# Patient Record
Sex: Male | Born: 1950 | Race: White | Hispanic: No | Smoking: Former smoker
Health system: Southern US, Community
[De-identification: ages and names within clinical notes are randomized; demographics above are authoritative.]

## PROBLEM LIST (undated history)

## (undated) DIAGNOSIS — M549 Dorsalgia, unspecified: Secondary | ICD-10-CM

## (undated) DIAGNOSIS — I10 Essential (primary) hypertension: Secondary | ICD-10-CM

## (undated) DIAGNOSIS — N400 Enlarged prostate without lower urinary tract symptoms: Secondary | ICD-10-CM

## (undated) DIAGNOSIS — J449 Chronic obstructive pulmonary disease, unspecified: Secondary | ICD-10-CM

## (undated) HISTORY — PX: PYLOROMYOTOMY: SUR1063

## (undated) HISTORY — DX: Dorsalgia, unspecified: M54.9

## (undated) HISTORY — DX: Benign prostatic hyperplasia without lower urinary tract symptoms: N40.0

## (undated) HISTORY — DX: Chronic obstructive pulmonary disease, unspecified: J44.9

## (undated) HISTORY — DX: Essential (primary) hypertension: I10

## (undated) HISTORY — PX: TONSILLECTOMY: SUR1361

---

## 2005-04-05 DIAGNOSIS — M549 Dorsalgia, unspecified: Secondary | ICD-10-CM

## 2005-04-05 HISTORY — DX: Dorsalgia, unspecified: M54.9

## 2014-10-07 ENCOUNTER — Telehealth: Payer: Self-pay | Admitting: Family Medicine

## 2014-10-07 ENCOUNTER — Ambulatory Visit (INDEPENDENT_AMBULATORY_CARE_PROVIDER_SITE_OTHER): Payer: PRIVATE HEALTH INSURANCE | Admitting: Physician Assistant

## 2014-10-07 VITALS — BP 158/82 | HR 110 | Temp 98.3°F | Resp 18 | Ht 67.0 in | Wt 151.0 lb

## 2014-10-07 DIAGNOSIS — I10 Essential (primary) hypertension: Secondary | ICD-10-CM | POA: Diagnosis not present

## 2014-10-07 DIAGNOSIS — F17200 Nicotine dependence, unspecified, uncomplicated: Secondary | ICD-10-CM | POA: Insufficient documentation

## 2014-10-07 DIAGNOSIS — N400 Enlarged prostate without lower urinary tract symptoms: Secondary | ICD-10-CM | POA: Insufficient documentation

## 2014-10-07 DIAGNOSIS — M542 Cervicalgia: Secondary | ICD-10-CM | POA: Diagnosis not present

## 2014-10-07 DIAGNOSIS — J449 Chronic obstructive pulmonary disease, unspecified: Secondary | ICD-10-CM | POA: Insufficient documentation

## 2014-10-07 MED ORDER — TRAMADOL HCL 50 MG PO TABS: 50.0000 mg | ORAL_TABLET | Freq: Three times a day (TID) | ORAL | Status: DC | PRN

## 2014-10-07 MED ORDER — CYCLOBENZAPRINE HCL 10 MG PO TABS: 10.0000 mg | ORAL_TABLET | Freq: Three times a day (TID) | ORAL | Status: DC | PRN

## 2014-10-07 NOTE — Progress Notes (Signed)
Subjective:   Patient ID: Travis Romero, male     DOB: 05-20-1950, 64 y.o.    MRN: [id number]  PCP: Orpah Melter, MD  Chief Complaint  Patient presents with  . Neck Pain    X 4.5 weeks    HPI  Presents for evaluation of 4.5 weeks of LEFT sided neck pain. He is accompanied today by his cousin [name], who drive him here.  "I can hardly function. It's very severe." History of similar symptoms in the past which resolved with Flexeril and Tramadol. Since this episode began, he has not tried anything to help-no ibuprofen, naproxen, acetaminophen, heat, ice or massage. "It won't do a damn thing."  Believes that he slept on it wrong. LEFT thumb and index, middle and ring fingers feel numb. Hasn't dropped anything. No radicular pain.  Prior to Admission medications   Medication Sig Start Date End Date Taking? Authorizing Provider  amLODipine (NORVASC) 2.5 MG tablet Take 2.5 mg by mouth daily.   Yes Historical Provider, MD  cloNIDine (CATAPRES) 0.2 MG tablet Take 0.2 mg by mouth 2 (two) times daily.   Yes Historical Provider, MD  Ipratropium-Albuterol (COMBIVENT IN) Inhale into the lungs.   Yes Historical Provider, MD  mirtazapine (REMERON) 45 MG tablet Take 45 mg by mouth at bedtime.   Yes Historical Provider, MD  spironolactone (ALDACTONE) 100 MG tablet Take 150 mg by mouth daily.   Yes Historical Provider, MD     Allergies  Allergen Reactions  . Advair Diskus [Fluticasone-Salmeterol] Other (See Comments)    Exacerbated BPH      Patient Active Problem List   Diagnosis Date Noted  . Benign essential HTN 10/07/2014  . BPH (benign prostatic hyperplasia) 10/07/2014  . COPD (chronic obstructive pulmonary disease) 10/07/2014  . Smoker 10/07/2014     Family History  Problem Relation Age of Onset  . Hypertension Mother   . Heart disease Father      History   Social History  . Marital Status: Divorced    Spouse Name: N/A  . Number of Children: 0  . Years of  Education: Master's   Occupational History  . 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 Smoking status: Current Every Day Smoker    Types: Cigarettes  . Smokeless tobacco: Not on file  . Alcohol Use: 0.0 oz/week    0 Standard drinks or equivalent per week  . Drug Use: No  . Sexual Activity: Not on file   Other Topics Concern  . Not on file   Social History Narrative  . Lives alone.        Review of Systems As above.      Objective:  Physical Exam  Constitutional: He is oriented to person, place, and time. He appears well-developed and well-nourished. He is active and cooperative. No distress.  BP 158/82 mmHg  Pulse 110  Temp(Src) 98.3 F (36.8 C) (Oral)  Resp 18  Ht 5\' 7"  (1.702 m)  Wt 151 lb (68.493 kg)  BMI 23.64 kg/m2  SpO2 95%   Eyes: Conjunctivae are normal.  Pulmonary/Chest: Effort normal.  Musculoskeletal:       Cervical back: He exhibits decreased range of motion (reduced ROM in all directions, especially extension, rotation tothe LEFT and side-bending to the LEFT, due to pain), pain and spasm (LEFT paraspinous muscles). He exhibits no tenderness, no bony tenderness, no swelling and no edema.       Right hand: Normal. Normal sensation  noted. Normal strength noted.       Left hand: Normal. Normal sensation noted. Normal strength noted.  Neurological: He is alert and oriented to person, place, and time.  Skin: No rash noted. Nails show clubbing (yellowed).  Psychiatric: He has a normal mood and affect. His speech is normal and behavior is normal.             Assessment & Plan:  1. Benign essential HTN Likely elevated today due to the pain he is experiencing. He has a cuff at home and will monitor it. If remains >140/90 as his pain resolves, he will follow-up with PCP.  2. Neck pain on left side Wry neck. Encouraged gentle ROM and application of moist heat. RTC if symptoms worsen/persist. Plan c-spine films. -  cyclobenzaprine (FLEXERIL) 10 MG tablet; Take 1 tablet (10 mg total) by mouth 3 (three) times daily as needed for muscle spasms.  Dispense: 30 tablet; Refill: 0 - traMADol (ULTRAM) 50 MG tablet; Take 1 tablet (50 mg total) by mouth every 8 (eight) hours as needed.  Dispense: 30 tablet; Refill: 0   Fara Chute, PA-C Physician Assistant-Certified [facility]

## 2014-10-07 NOTE — Patient Instructions (Addendum)
When you need to work/drive, reserve the cyclobenzaprine (Flexeril) for bedtime, due to the sedating effects of the muscle relaxer.  Check your blood pressure as your pain improves. If it remains elevated (above 140/90), please follow-up with your primary care provider.

## 2014-10-07 NOTE — Telephone Encounter (Signed)
Flexeril 10 mg TID #30, No RF , called in to [facility] [phone]. Patient had tramadol written rx, flexeril did not electronically go through.

## 2014-10-09 ENCOUNTER — Telehealth: Payer: Self-pay | Admitting: Radiology

## 2014-10-09 ENCOUNTER — Telehealth: Payer: Self-pay

## 2014-10-09 NOTE — Telephone Encounter (Signed)
Pt states since taking prescribed tramadol/flexeril he is having quite noticeable swelling of genitals Please advise   Best phone pt is [phone]

## 2014-10-09 NOTE — Telephone Encounter (Signed)
Pt called stating he has had swelling of his genitals due to his Rx for Flexeril and Tramadol prescribed on 10/07/14. Per Araceli Bouche PA-C: pt advised to discontinue his Flexeril and continue his Tramadol as prescribed. He was also informed to use ice and warm compress for comfort and swelling.

## 2014-10-10 NOTE — Telephone Encounter (Signed)
Travis Romero and Travis Romero spoke to pt yesterday and gave advice.

## 2014-10-11 ENCOUNTER — Emergency Department (HOSPITAL_COMMUNITY): Payer: PRIVATE HEALTH INSURANCE

## 2014-10-11 ENCOUNTER — Inpatient Hospital Stay (HOSPITAL_COMMUNITY)
Admission: EM | Admit: 2014-10-11 | Discharge: 2014-11-04 | DRG: 233 | Disposition: A | Discharge status: Rehabilitation Facility | Payer: PRIVATE HEALTH INSURANCE | Attending: Thoracic Surgery (Cardiothoracic Vascular Surgery) | Admitting: Thoracic Surgery (Cardiothoracic Vascular Surgery)

## 2014-10-11 DIAGNOSIS — I255 Ischemic cardiomyopathy: Secondary | ICD-10-CM | POA: Diagnosis present

## 2014-10-11 DIAGNOSIS — R0602 Shortness of breath: Secondary | ICD-10-CM | POA: Insufficient documentation

## 2014-10-11 DIAGNOSIS — F4322 Adjustment disorder with anxiety: Secondary | ICD-10-CM | POA: Diagnosis present

## 2014-10-11 DIAGNOSIS — D62 Acute posthemorrhagic anemia: Secondary | ICD-10-CM | POA: Diagnosis not present

## 2014-10-11 DIAGNOSIS — I509 Heart failure, unspecified: Secondary | ICD-10-CM

## 2014-10-11 DIAGNOSIS — G8929 Other chronic pain: Secondary | ICD-10-CM | POA: Diagnosis present

## 2014-10-11 DIAGNOSIS — Z978 Presence of other specified devices: Secondary | ICD-10-CM | POA: Insufficient documentation

## 2014-10-11 DIAGNOSIS — J9602 Acute respiratory failure with hypercapnia: Secondary | ICD-10-CM | POA: Diagnosis present

## 2014-10-11 DIAGNOSIS — J918 Pleural effusion in other conditions classified elsewhere: Secondary | ICD-10-CM | POA: Diagnosis present

## 2014-10-11 DIAGNOSIS — Z79899 Other long term (current) drug therapy: Secondary | ICD-10-CM

## 2014-10-11 DIAGNOSIS — J449 Chronic obstructive pulmonary disease, unspecified: Secondary | ICD-10-CM | POA: Diagnosis present

## 2014-10-11 DIAGNOSIS — J810 Acute pulmonary edema: Secondary | ICD-10-CM | POA: Insufficient documentation

## 2014-10-11 DIAGNOSIS — F419 Anxiety disorder, unspecified: Secondary | ICD-10-CM | POA: Diagnosis present

## 2014-10-11 DIAGNOSIS — Z9889 Other specified postprocedural states: Secondary | ICD-10-CM

## 2014-10-11 DIAGNOSIS — R601 Generalized edema: Secondary | ICD-10-CM

## 2014-10-11 DIAGNOSIS — D7589 Other specified diseases of blood and blood-forming organs: Secondary | ICD-10-CM | POA: Diagnosis present

## 2014-10-11 DIAGNOSIS — I110 Hypertensive heart disease with heart failure: Secondary | ICD-10-CM | POA: Diagnosis present

## 2014-10-11 DIAGNOSIS — Z888 Allergy status to other drugs, medicaments and biological substances status: Secondary | ICD-10-CM

## 2014-10-11 DIAGNOSIS — W06XXXA Fall from bed, initial encounter: Secondary | ICD-10-CM | POA: Diagnosis not present

## 2014-10-11 DIAGNOSIS — I214 Non-ST elevation (NSTEMI) myocardial infarction: Secondary | ICD-10-CM | POA: Diagnosis present

## 2014-10-11 DIAGNOSIS — J9601 Acute respiratory failure with hypoxia: Secondary | ICD-10-CM

## 2014-10-11 DIAGNOSIS — Z8249 Family history of ischemic heart disease and other diseases of the circulatory system: Secondary | ICD-10-CM

## 2014-10-11 DIAGNOSIS — I2510 Atherosclerotic heart disease of native coronary artery without angina pectoris: Secondary | ICD-10-CM | POA: Diagnosis present

## 2014-10-11 DIAGNOSIS — R0902 Hypoxemia: Secondary | ICD-10-CM

## 2014-10-11 DIAGNOSIS — R338 Other retention of urine: Secondary | ICD-10-CM | POA: Diagnosis not present

## 2014-10-11 DIAGNOSIS — T17990A Other foreign object in respiratory tract, part unspecified in causing asphyxiation, initial encounter: Secondary | ICD-10-CM | POA: Diagnosis not present

## 2014-10-11 DIAGNOSIS — E871 Hypo-osmolality and hyponatremia: Secondary | ICD-10-CM | POA: Diagnosis present

## 2014-10-11 DIAGNOSIS — G4700 Insomnia, unspecified: Secondary | ICD-10-CM | POA: Diagnosis present

## 2014-10-11 DIAGNOSIS — E873 Alkalosis: Secondary | ICD-10-CM | POA: Diagnosis present

## 2014-10-11 DIAGNOSIS — F17210 Nicotine dependence, cigarettes, uncomplicated: Secondary | ICD-10-CM | POA: Diagnosis present

## 2014-10-11 DIAGNOSIS — F329 Major depressive disorder, single episode, unspecified: Secondary | ICD-10-CM | POA: Diagnosis present

## 2014-10-11 DIAGNOSIS — E785 Hyperlipidemia, unspecified: Secondary | ICD-10-CM | POA: Diagnosis present

## 2014-10-11 DIAGNOSIS — Z4659 Encounter for fitting and adjustment of other gastrointestinal appliance and device: Secondary | ICD-10-CM

## 2014-10-11 DIAGNOSIS — I5043 Acute on chronic combined systolic (congestive) and diastolic (congestive) heart failure: Secondary | ICD-10-CM | POA: Diagnosis not present

## 2014-10-11 DIAGNOSIS — Z951 Presence of aortocoronary bypass graft: Secondary | ICD-10-CM

## 2014-10-11 DIAGNOSIS — I5041 Acute combined systolic (congestive) and diastolic (congestive) heart failure: Secondary | ICD-10-CM

## 2014-10-11 DIAGNOSIS — I739 Peripheral vascular disease, unspecified: Secondary | ICD-10-CM | POA: Diagnosis present

## 2014-10-11 DIAGNOSIS — R339 Retention of urine, unspecified: Secondary | ICD-10-CM | POA: Insufficient documentation

## 2014-10-11 DIAGNOSIS — K5900 Constipation, unspecified: Secondary | ICD-10-CM | POA: Diagnosis not present

## 2014-10-11 DIAGNOSIS — T502X5A Adverse effect of carbonic-anhydrase inhibitors, benzothiadiazides and other diuretics, initial encounter: Secondary | ICD-10-CM | POA: Diagnosis not present

## 2014-10-11 DIAGNOSIS — Z720 Tobacco use: Secondary | ICD-10-CM | POA: Insufficient documentation

## 2014-10-11 DIAGNOSIS — Z9289 Personal history of other medical treatment: Secondary | ICD-10-CM

## 2014-10-11 DIAGNOSIS — N179 Acute kidney failure, unspecified: Secondary | ICD-10-CM | POA: Diagnosis present

## 2014-10-11 DIAGNOSIS — J189 Pneumonia, unspecified organism: Secondary | ICD-10-CM | POA: Diagnosis not present

## 2014-10-11 DIAGNOSIS — R54 Age-related physical debility: Secondary | ICD-10-CM | POA: Diagnosis present

## 2014-10-11 DIAGNOSIS — J90 Pleural effusion, not elsewhere classified: Secondary | ICD-10-CM | POA: Insufficient documentation

## 2014-10-11 DIAGNOSIS — N401 Enlarged prostate with lower urinary tract symptoms: Secondary | ICD-10-CM | POA: Diagnosis present

## 2014-10-11 LAB — BLOOD GAS, ARTERIAL
ACID-BASE DEFICIT: 2.4 mmol/L — AB (ref 0.0–2.0)
Bicarbonate: 25.6 mEq/L — ABNORMAL HIGH (ref 20.0–24.0)
DRAWN BY: 422461
FIO2: 1 %
O2 CONTENT: 15 L/min
O2 SAT: 90.6 %
PCO2 ART: 58.5 mmHg — AB (ref 35.0–45.0)
Patient temperature: 98.3
TCO2: 23 mmol/L (ref 0–100)
pH, Arterial: 7.262 — ABNORMAL LOW (ref 7.350–7.450)
pO2, Arterial: 74.9 mmHg — ABNORMAL LOW (ref 80.0–100.0)

## 2014-10-11 LAB — CBC WITH DIFFERENTIAL/PLATELET
Basophils Absolute: 0.1 10*3/uL (ref 0.0–0.1)
Basophils Relative: 1 % (ref 0–1)
EOS PCT: 1 % (ref 0–5)
Eosinophils Absolute: 0.1 10*3/uL (ref 0.0–0.7)
HEMATOCRIT: 47.4 % (ref 39.0–52.0)
Hemoglobin: 15.7 g/dL (ref 13.0–17.0)
Lymphocytes Relative: 13 % (ref 12–46)
Lymphs Abs: 1.3 10*3/uL (ref 0.7–4.0)
MCH: 36.3 pg — AB (ref 26.0–34.0)
MCHC: 33.1 g/dL (ref 30.0–36.0)
MCV: 109.7 fL — AB (ref 78.0–100.0)
MONO ABS: 1.3 10*3/uL — AB (ref 0.1–1.0)
Monocytes Relative: 12 % (ref 3–12)
Neutro Abs: 7.5 10*3/uL (ref 1.7–7.7)
Neutrophils Relative %: 73 % (ref 43–77)
PLATELETS: 391 10*3/uL (ref 150–400)
RBC: 4.32 MIL/uL (ref 4.22–5.81)
RDW: 14.4 % (ref 11.5–15.5)
WBC: 10.2 10*3/uL (ref 4.0–10.5)

## 2014-10-11 LAB — TROPONIN I: Troponin I: 0.44 ng/mL — ABNORMAL HIGH (ref <0.031)

## 2014-10-11 LAB — URINE MICROSCOPIC-ADD ON

## 2014-10-11 LAB — HEPATIC FUNCTION PANEL
ALK PHOS: 90 U/L (ref 38–126)
ALT: 26 U/L (ref 17–63)
AST: 29 U/L (ref 15–41)
Albumin: 3.2 g/dL — ABNORMAL LOW (ref 3.5–5.0)
Bilirubin, Direct: 0.1 mg/dL (ref 0.1–0.5)
Indirect Bilirubin: 0.5 mg/dL (ref 0.3–0.9)
TOTAL PROTEIN: 7.1 g/dL (ref 6.5–8.1)
Total Bilirubin: 0.6 mg/dL (ref 0.3–1.2)

## 2014-10-11 LAB — BASIC METABOLIC PANEL
Anion gap: 10 (ref 5–15)
BUN: 23 mg/dL — AB (ref 6–20)
CALCIUM: 9.2 mg/dL (ref 8.9–10.3)
CHLORIDE: 96 mmol/L — AB (ref 101–111)
CO2: 25 mmol/L (ref 22–32)
Creatinine, Ser: 1.16 mg/dL (ref 0.61–1.24)
GFR calc Af Amer: >60 mL/min (ref >60)
GFR calc non Af Amer: >60 mL/min (ref >60)
Glucose, Bld: 137 mg/dL — ABNORMAL HIGH (ref 65–99)
Potassium: 5.1 mmol/L (ref 3.5–5.1)
Sodium: 131 mmol/L — ABNORMAL LOW (ref 135–145)

## 2014-10-11 LAB — AMMONIA: Ammonia: 23 umol/L (ref 9–35)

## 2014-10-11 LAB — URINALYSIS, ROUTINE W REFLEX MICROSCOPIC
BILIRUBIN URINE: NEGATIVE
Glucose, UA: NEGATIVE mg/dL
Hgb urine dipstick: NEGATIVE
Ketones, ur: NEGATIVE mg/dL
LEUKOCYTES UA: NEGATIVE
Nitrite: NEGATIVE
PH: 6 (ref 5.0–8.0)
Protein, ur: >300 mg/dL — AB
Specific Gravity, Urine: 1.016 (ref 1.005–1.030)
Urobilinogen, UA: 0.2 mg/dL (ref 0.0–1.0)

## 2014-10-11 LAB — BRAIN NATRIURETIC PEPTIDE: B Natriuretic Peptide: 1073.2 pg/mL — ABNORMAL HIGH (ref 0.0–100.0)

## 2014-10-11 LAB — PROTIME-INR
INR: 0.91 (ref 0.00–1.49)
Prothrombin Time: 12.5 seconds (ref 11.6–15.2)

## 2014-10-11 MED ORDER — PROPOFOL 1000 MG/100ML IV EMUL
5.0000 ug/kg/min | Freq: Once | INTRAVENOUS | Status: AC
  Administered 2014-10-11: 24.331 ug/kg/min via INTRAVENOUS
  Filled 2014-10-11: qty 100

## 2014-10-11 MED ORDER — SUCCINYLCHOLINE CHLORIDE 20 MG/ML IJ SOLN
INTRAMUSCULAR | Status: AC
  Administered 2014-10-11: 100 mg
  Filled 2014-10-11: qty 1

## 2014-10-11 MED ORDER — FUROSEMIDE 10 MG/ML IJ SOLN
80.0000 mg | Freq: Once | INTRAMUSCULAR | Status: AC
  Administered 2014-10-11: 80 mg via INTRAVENOUS
  Filled 2014-10-11: qty 8

## 2014-10-11 MED ORDER — LORAZEPAM 2 MG/ML IJ SOLN
1.0000 mg | Freq: Once | INTRAMUSCULAR | Status: AC
Start: 2014-10-11 — End: 2014-10-11
  Administered 2014-10-11: 1 mg via INTRAVENOUS
  Filled 2014-10-11: qty 1

## 2014-10-11 MED ORDER — LIDOCAINE HCL (CARDIAC) 20 MG/ML IV SOLN
INTRAVENOUS | Status: AC
  Filled 2014-10-11: qty 5

## 2014-10-11 MED ORDER — ROCURONIUM BROMIDE 50 MG/5ML IV SOLN
INTRAVENOUS | Status: AC
  Filled 2014-10-11: qty 2

## 2014-10-11 MED ORDER — IPRATROPIUM-ALBUTEROL 0.5-2.5 (3) MG/3ML IN SOLN
3.0000 mL | Freq: Once | RESPIRATORY_TRACT | Status: AC
  Administered 2014-10-11: 3 mL via RESPIRATORY_TRACT
  Filled 2014-10-11: qty 3

## 2014-10-11 MED ORDER — NITROGLYCERIN IN D5W 200-5 MCG/ML-% IV SOLN
0.0000 ug/min | Freq: Once | INTRAVENOUS | Status: AC
  Administered 2014-10-11: 5 ug/min via INTRAVENOUS
  Filled 2014-10-11: qty 250

## 2014-10-11 MED ORDER — ETOMIDATE 2 MG/ML IV SOLN
INTRAVENOUS | Status: AC
  Administered 2014-10-11: 20 mg
  Filled 2014-10-11: qty 20

## 2014-10-11 MED ORDER — MIDAZOLAM HCL 2 MG/2ML IJ SOLN
2.0000 mg | Freq: Once | INTRAMUSCULAR | Status: AC
  Administered 2014-10-11: 2 mg via INTRAVENOUS
  Filled 2014-10-11: qty 2

## 2014-10-11 NOTE — ED Notes (Signed)
EKG given to EDP Pollina for review

## 2014-10-11 NOTE — ED Notes (Addendum)
Etomidate 20 mg IVgiven (instead of at 2045 as recorded).

## 2014-10-11 NOTE — ED Notes (Signed)
Pt BIB EMS. Pt started Flexeril last Monday. Pt had swelling to scrotum and shortness of breath a few days later and stopped the Flexeril. Pt states today he is having worsening scrotal swelling and worsening shortness of breath. Pt has hx of COPD and has been using his combivent inhaler today. Pt has dyspnea on exertion and appears anxious. Pt also incontinent of urine today.

## 2014-10-11 NOTE — ED Notes (Signed)
Succinylcholine 100 mg given at this time (instead of at 2046 as recorded).

## 2014-10-11 NOTE — Progress Notes (Signed)
Pt placed on BiPAP via ffm using noninvasive mode on ventilator.  Pt on PSV with PS of 9 and PEEP of 6.  Pt stable and comfortable at this time.  RT to assess as needed.

## 2014-10-11 NOTE — ED Notes (Signed)
CCM called for ETA, they are tied up in procedures with other critical patients, unkn ETA.

## 2014-10-11 NOTE — ED Provider Notes (Signed)
CSN: [id number]     Arrival date & time 10/11/14  1910 History   First MD Initiated Contact with Patient 10/11/14 1915     Chief Complaint  Patient presents with  . Groin Swelling     (Consider location/radiation/quality/duration/timing/severity/associated sxs/prior Treatment) HPI Comments: Patient presents to the ER for evaluation of shortness of breath. Patient thinks he might be having a reaction to a medication. He reports that last week he woke up with neck pain and was seen in urgent care. He was started on Flexeril which helped his neck pain, but after he took it for several days, he started to notice that he was experiencing increased shortness of breath and also swelling of his scrotum. He stopped the medication, but symptoms have progressively worsened. Patient denies any chest pain. He has not had any tongue or throat swelling. There is no rash. Patient does report a history of COPD, has been using his Combivent without improvement.   Past Medical History  Diagnosis Date  . Hypertension   . COPD (chronic obstructive pulmonary disease)   . BPH (benign prostatic hyperplasia)   . Back pain 2007    after fall down steps   Past Surgical History  Procedure Laterality Date  . Tonsillectomy    . Pyloromyotomy      pyloric stenosis   Family History  Problem Relation Age of Onset  . Hypertension Mother   . Heart disease Father    History  Substance Use Topics  . Smoking status: Current Every Day Smoker -- 20 years    Types: Cigarettes  . Smokeless tobacco: Never Used  . Alcohol Use: 1.2 - 1.8 oz/week    2-3 Standard drinks or equivalent per week    Review of Systems  Constitutional: Negative for fever.  Respiratory: Positive for shortness of breath.   Cardiovascular: Negative for chest pain.  Gastrointestinal: Negative for abdominal pain.  Genitourinary: Positive for scrotal swelling.  All other systems reviewed and are negative.     Allergies  Advair diskus and  Flexeril  Home Medications   Prior to Admission medications   Medication Sig Start Date End Date Taking? Authorizing Provider  ALPRAZolam (XANAX) 0.5 MG tablet TAKE 1 TABLET BY MOUTH 3 TIMES A DAY AS NEEDED FOR 90 DAYS 10/02/14  Yes Historical Provider, MD  benzonatate (TESSALON) 100 MG capsule Take 100-200 mg by mouth 3 (three) times daily as needed for cough.   Yes Historical Provider, MD  cloNIDine (CATAPRES) 0.2 MG tablet Take 0.2 mg by mouth 4 (four) times daily as needed.    Yes Historical Provider, MD  dutasteride (AVODART) 0.5 MG capsule Take 0.5 mg by mouth daily. 07/29/14  Yes Historical Provider, MD  Ipratropium-Albuterol ([place]) Inhale into the lungs.   Yes Historical Provider, MD  isradipine (DYNACIRC) 5 MG capsule Take 10 mg by mouth 2 (two) times daily. 08/19/14  Yes Historical Provider, MD  mirtazapine (REMERON) 45 MG tablet Take 45 mg by mouth at bedtime.   Yes Historical Provider, MD  spironolactone (ALDACTONE) 100 MG tablet Take 150 mg by mouth daily.   Yes Historical Provider, MD  telmisartan (MICARDIS) 80 MG tablet Take 80 mg by mouth daily. 08/17/14  Yes Historical Provider, MD  traMADol (ULTRAM) 50 MG tablet Take 1 tablet (50 mg total) by mouth every 8 (eight) hours as needed. 10/07/14  Yes Chelle Jeffery, PA-C  cyclobenzaprine (FLEXERIL) 10 MG tablet Take 1 tablet (10 mg total) by mouth 3 (three) times daily as needed  for muscle spasms. Patient not taking: Reported on 10/11/2014 10/07/14   Chelle Jeffery, PA-C   BP 167/96 mmHg  Pulse 119  Resp 19  SpO2 92% Physical Exam  Constitutional: He is oriented to person, place, and time. He appears well-developed and well-nourished. He appears distressed.  HENT:  Head: Normocephalic and atraumatic.  Right Ear: Hearing normal.  Left Ear: Hearing normal.  Nose: Nose normal.  Mouth/Throat: Oropharynx is clear and moist and mucous membranes are normal.  Eyes: Conjunctivae and EOM are normal. Pupils are equal, round, and  reactive to light.  Neck: Normal range of motion. Neck supple.  Cardiovascular: Regular rhythm, S1 normal and S2 normal.  Tachycardia present.  Exam reveals no gallop and no friction rub.   No murmur heard. Pulmonary/Chest: Accessory muscle usage present. No respiratory distress. He has decreased breath sounds. He has wheezes. He has rhonchi. He has rales. He exhibits no tenderness.  Abdominal: Soft. Normal appearance and bowel sounds are normal. There is no hepatosplenomegaly. There is no tenderness. There is no rebound, no guarding, no tenderness at McBurney's point and negative Murphy's sign. No hernia.  Musculoskeletal: Normal range of motion. He exhibits edema.  Neurological: He is alert and oriented to person, place, and time. He has normal strength. No cranial nerve deficit or sensory deficit. Coordination normal. GCS eye subscore is 4. GCS verbal subscore is 5. GCS motor subscore is 6.  Skin: Skin is warm, dry and intact. No rash noted. No cyanosis.  Psychiatric: He has a normal mood and affect. His speech is normal and behavior is normal. Thought content normal.  Nursing note and vitals reviewed.   ED Course  INTUBATION Date/Time: 10/11/2014 10:00 PM Performed by: Orpah Greek Authorized by: Orpah Greek Consent: The procedure was performed in an emergent situation. Verbal consent obtained. Written consent not obtained. Patient identity confirmed: verbally with patient, arm band and hospital-assigned identification number Time out: Immediately prior to procedure a "time out" was called to verify the correct patient, procedure, equipment, support staff and site/side marked as required. Indications: respiratory distress,  respiratory failure,  airway protection,  hypoxemia and  hypercapnia Intubation method: video-assisted Patient status: paralyzed (RSI) Preoxygenation: BVM Pretreatment medications: none Sedatives: see MAR for details Paralytic:  succinylcholine Laryngoscope size: GlideScope #4. Tube size: 7.5 mm Tube type: cuffed Number of attempts: 1 Post-procedure assessment: CO2 detector Breath sounds: reduced on right and reduced on left Cuff inflated: yes ETT to teeth: 21 cm Tube secured with: ETT holder Chest x-ray interpreted by radiologist. Chest x-ray findings: endotracheal tube in appropriate position Comments: Initial intubation visualized endotracheal tube passing through the vocal cords. Tube was advanced to a level of 21 cm at the teeth and cuff was inflated. Patient had decreased breath sounds bilaterally. There was not significant chest rise bilaterally. Glide scope was reintroduced to the oral pharyngeal region and cords were visualized once again. The balloon was visualized above the vocal cords. Balloon was deflated and the tube was advanced and balloon was reinflated. Tube had to be advanced almost entirely to advanced through the cords.   (including critical care time) Labs Review Labs Reviewed  CBC WITH DIFFERENTIAL/PLATELET - Abnormal; Notable for the following:    MCV 109.7 (*)    MCH 36.3 (*)    Monocytes Absolute 1.3 (*)    All other components within normal limits  HEPATIC FUNCTION PANEL - Abnormal; Notable for the following:    Albumin 3.2 (*)    All other components within  normal limits  TROPONIN I - Abnormal; Notable for the following:    Troponin I 0.44 (*)    All other components within normal limits  BRAIN NATRIURETIC PEPTIDE - Abnormal; Notable for the following:    B Natriuretic Peptide 1073.2 (*)    All other components within normal limits  BASIC METABOLIC PANEL - Abnormal; Notable for the following:    Sodium 131 (*)    Chloride 96 (*)    Glucose, Bld 137 (*)    BUN 23 (*)    All other components within normal limits  URINALYSIS, ROUTINE W REFLEX MICROSCOPIC (NOT AT [facility]) - Abnormal; Notable for the following:    Protein, ur >300 (*)    All other components within normal limits   BLOOD GAS, ARTERIAL - Abnormal; Notable for the following:    pH, Arterial 7.262 (*)    pCO2 arterial 58.5 (*)    pO2, Arterial 74.9 (*)    Bicarbonate 25.6 (*)    Acid-base deficit 2.4 (*)    All other components within normal limits  AMMONIA  PROTIME-INR  URINE MICROSCOPIC-ADD ON  URINALYSIS, ROUTINE W REFLEX MICROSCOPIC (NOT AT [facility])    Imaging Review Dg Chest Portable 1 View  10/11/2014   CLINICAL DATA:  64 year old male with shortness of breath. Endotracheal tube placement  EXAM: PORTABLE CHEST - 1 VIEW  COMPARISON:  10/11/2014 and prior studies  FINDINGS: Upper limits normal heart size noted.  An endotracheal tube is now identified with tip 4.8 cm above the carina.  Slightly increased interstitial and mild airspace opacities are noted bilaterally likely representing pulmonary edema.  Mild bibasilar atelectasis is present.  There is no evidence of pneumothorax. No acute bony abnormalities are present.  IMPRESSION: Slightly increased interstitial and airspace opacities likely representing pulmonary edema.  Endotracheal tube with tip 4.8 cm above the carina.   Electronically Signed   By: Margarette Canada M.D.   On: 10/11/2014 22:25   Dg Chest Port 1 View  10/11/2014   CLINICAL DATA:  Shortness of breath.  COPD.  EXAM: PORTABLE CHEST - 1 VIEW  COMPARISON:  11/21/2012  FINDINGS: Changes of COPD are again demonstrated. New diffuse symmetric bilateral airspace disease is seen, consistent with diffuse pulmonary edema. Small bilateral pleural effusions also noted. Heart size remains within normal limits.  IMPRESSION: New diffuse symmetric bilateral airspace disease most likely due to diffuse pulmonary edema, with infection considered less likely.  New small bilateral pleural effusions.  COPD.   Electronically Signed   By: Earle Gell M.D.   On: 10/11/2014 20:30     EKG Interpretation   Date/Time:  Friday October 11 2014 20:06:44 EDT Ventricular Rate:  136 PR Interval:  119 QRS Duration: 129 QT  Interval:  355 QTC Calculation: 534 R Axis:   78 Text Interpretation:  Sinus tachycardia LAE, consider biatrial enlargement  Right bundle branch block Artifact in lead(s) I II aVR aVL and baseline  wander in lead(s) V1 V2 V6 No significant change since last tracing  Confirmed by POLLINA  MD, CHRISTOPHER [phone]) on 10/11/2014 8:12:01 PM      MDM   Final diagnoses:  Shortness of breath  Acute pulmonary edema  NSTEMI (non-ST elevated myocardial infarction)  Acute respiratory failure with hypoxia      Patient presented to the ER initially stating that he was concerned about the amount of swelling in his groin. Upon initial evaluation, however, he was noted to be short of breath. Patient reports a history of  COPD and felt that his breathing difficulty was secondary to his COPD. He asked for a breathing treatment which did not help. Patient rapidly became more dyspneic. Workup revealed concern for acute pulmonary edema. He also has an elevated troponin without obvious ischemia on his EKG. Blood pressure progressively elevated here in the ER consistent with hypertensive crisis.  Patient administered Lasix and nitroglycerin drip. He was placed on BiPAP but did not improve. At this point it was decided that he needed intubation. Patient had been told prior to BiPAP trial that he could be intubated and he agreed with this plan if necessary. He was urgently intubated. There was difficulty in that his airway is extremely long. The tube needed to be advanced almost entirely into his mouth in order to reach a level of 4.8 cm above the carina.  Case was discussed with Dr. Oletta Darter, critical care. He will send someone to evaluate and admit the patient. He asked for cardiology consultation. Discussed with Dr. Percival Spanish, on call for cardiology. He felt that the patient was exhibiting hypertensive crisis, did not require transfer to [facility]. He did not feel that the patient needed any specific treatment other  than the Lasix and nitroglycerin that was initiated here in the ER. He states that since critical care has not seen the patient yet, they should evaluate the patient and consult him if they need him.  CRITICAL CARE Performed by: Orpah Greek   Total critical care time: 62min  Critical care time was exclusive of separately billable procedures and treating other patients.  Critical care was necessary to treat or prevent imminent or life-threatening deterioration.  Critical care was time spent personally by me on the following activities: development of treatment plan with patient and/or surrogate as well as nursing, discussions with consultants, evaluation of patient's response to treatment, examination of patient, obtaining history from patient or surrogate, ordering and performing treatments and interventions, ordering and review of laboratory studies, ordering and review of radiographic studies, pulse oximetry and re-evaluation of patient's condition.   Orpah Greek, MD 10/11/14 2240

## 2014-10-11 NOTE — ED Notes (Signed)
Bed: WA06 Expected date:  Expected time:  Means of arrival:  Comments: EMS/32M/scrotal swelling

## 2014-10-12 ENCOUNTER — Inpatient Hospital Stay (HOSPITAL_COMMUNITY): Payer: PRIVATE HEALTH INSURANCE

## 2014-10-12 DIAGNOSIS — E873 Alkalosis: Secondary | ICD-10-CM | POA: Diagnosis present

## 2014-10-12 DIAGNOSIS — J918 Pleural effusion in other conditions classified elsewhere: Secondary | ICD-10-CM | POA: Diagnosis present

## 2014-10-12 DIAGNOSIS — D7589 Other specified diseases of blood and blood-forming organs: Secondary | ICD-10-CM | POA: Diagnosis present

## 2014-10-12 DIAGNOSIS — F419 Anxiety disorder, unspecified: Secondary | ICD-10-CM | POA: Diagnosis present

## 2014-10-12 DIAGNOSIS — F329 Major depressive disorder, single episode, unspecified: Secondary | ICD-10-CM | POA: Diagnosis present

## 2014-10-12 DIAGNOSIS — J180 Bronchopneumonia, unspecified organism: Secondary | ICD-10-CM | POA: Diagnosis not present

## 2014-10-12 DIAGNOSIS — Z888 Allergy status to other drugs, medicaments and biological substances status: Secondary | ICD-10-CM | POA: Diagnosis not present

## 2014-10-12 DIAGNOSIS — R0602 Shortness of breath: Secondary | ICD-10-CM | POA: Diagnosis present

## 2014-10-12 DIAGNOSIS — J432 Centrilobular emphysema: Secondary | ICD-10-CM | POA: Diagnosis not present

## 2014-10-12 DIAGNOSIS — E871 Hypo-osmolality and hyponatremia: Secondary | ICD-10-CM | POA: Diagnosis present

## 2014-10-12 DIAGNOSIS — I110 Hypertensive heart disease with heart failure: Secondary | ICD-10-CM | POA: Diagnosis present

## 2014-10-12 DIAGNOSIS — J189 Pneumonia, unspecified organism: Secondary | ICD-10-CM | POA: Diagnosis not present

## 2014-10-12 DIAGNOSIS — I739 Peripheral vascular disease, unspecified: Secondary | ICD-10-CM | POA: Diagnosis present

## 2014-10-12 DIAGNOSIS — J449 Chronic obstructive pulmonary disease, unspecified: Secondary | ICD-10-CM | POA: Diagnosis present

## 2014-10-12 DIAGNOSIS — I255 Ischemic cardiomyopathy: Secondary | ICD-10-CM | POA: Diagnosis present

## 2014-10-12 DIAGNOSIS — N179 Acute kidney failure, unspecified: Secondary | ICD-10-CM | POA: Diagnosis present

## 2014-10-12 DIAGNOSIS — J9602 Acute respiratory failure with hypercapnia: Secondary | ICD-10-CM | POA: Diagnosis present

## 2014-10-12 DIAGNOSIS — I5043 Acute on chronic combined systolic (congestive) and diastolic (congestive) heart failure: Secondary | ICD-10-CM | POA: Diagnosis present

## 2014-10-12 DIAGNOSIS — I2510 Atherosclerotic heart disease of native coronary artery without angina pectoris: Secondary | ICD-10-CM | POA: Diagnosis present

## 2014-10-12 DIAGNOSIS — Z79899 Other long term (current) drug therapy: Secondary | ICD-10-CM | POA: Diagnosis not present

## 2014-10-12 DIAGNOSIS — G4700 Insomnia, unspecified: Secondary | ICD-10-CM | POA: Diagnosis present

## 2014-10-12 DIAGNOSIS — N401 Enlarged prostate with lower urinary tract symptoms: Secondary | ICD-10-CM | POA: Diagnosis present

## 2014-10-12 DIAGNOSIS — Z789 Other specified health status: Secondary | ICD-10-CM | POA: Diagnosis not present

## 2014-10-12 DIAGNOSIS — I509 Heart failure, unspecified: Secondary | ICD-10-CM | POA: Diagnosis not present

## 2014-10-12 DIAGNOSIS — N400 Enlarged prostate without lower urinary tract symptoms: Secondary | ICD-10-CM | POA: Diagnosis not present

## 2014-10-12 DIAGNOSIS — Z951 Presence of aortocoronary bypass graft: Secondary | ICD-10-CM | POA: Diagnosis not present

## 2014-10-12 DIAGNOSIS — F4322 Adjustment disorder with anxiety: Secondary | ICD-10-CM | POA: Diagnosis present

## 2014-10-12 DIAGNOSIS — W06XXXA Fall from bed, initial encounter: Secondary | ICD-10-CM | POA: Diagnosis not present

## 2014-10-12 DIAGNOSIS — Z8249 Family history of ischemic heart disease and other diseases of the circulatory system: Secondary | ICD-10-CM | POA: Diagnosis not present

## 2014-10-12 DIAGNOSIS — D62 Acute posthemorrhagic anemia: Secondary | ICD-10-CM | POA: Diagnosis not present

## 2014-10-12 DIAGNOSIS — R54 Age-related physical debility: Secondary | ICD-10-CM | POA: Diagnosis present

## 2014-10-12 DIAGNOSIS — J9601 Acute respiratory failure with hypoxia: Secondary | ICD-10-CM | POA: Diagnosis present

## 2014-10-12 DIAGNOSIS — R338 Other retention of urine: Secondary | ICD-10-CM | POA: Diagnosis not present

## 2014-10-12 DIAGNOSIS — G8929 Other chronic pain: Secondary | ICD-10-CM | POA: Diagnosis present

## 2014-10-12 DIAGNOSIS — J810 Acute pulmonary edema: Secondary | ICD-10-CM | POA: Diagnosis not present

## 2014-10-12 DIAGNOSIS — R5381 Other malaise: Secondary | ICD-10-CM | POA: Diagnosis not present

## 2014-10-12 DIAGNOSIS — F17210 Nicotine dependence, cigarettes, uncomplicated: Secondary | ICD-10-CM | POA: Diagnosis present

## 2014-10-12 DIAGNOSIS — I25110 Atherosclerotic heart disease of native coronary artery with unstable angina pectoris: Secondary | ICD-10-CM | POA: Diagnosis not present

## 2014-10-12 DIAGNOSIS — T17990A Other foreign object in respiratory tract, part unspecified in causing asphyxiation, initial encounter: Secondary | ICD-10-CM | POA: Diagnosis not present

## 2014-10-12 DIAGNOSIS — I214 Non-ST elevation (NSTEMI) myocardial infarction: Secondary | ICD-10-CM | POA: Diagnosis present

## 2014-10-12 DIAGNOSIS — J90 Pleural effusion, not elsewhere classified: Secondary | ICD-10-CM | POA: Diagnosis not present

## 2014-10-12 DIAGNOSIS — K5900 Constipation, unspecified: Secondary | ICD-10-CM | POA: Diagnosis not present

## 2014-10-12 DIAGNOSIS — I5041 Acute combined systolic (congestive) and diastolic (congestive) heart failure: Secondary | ICD-10-CM | POA: Diagnosis not present

## 2014-10-12 DIAGNOSIS — T502X5A Adverse effect of carbonic-anhydrase inhibitors, benzothiadiazides and other diuretics, initial encounter: Secondary | ICD-10-CM | POA: Diagnosis not present

## 2014-10-12 DIAGNOSIS — E785 Hyperlipidemia, unspecified: Secondary | ICD-10-CM | POA: Diagnosis present

## 2014-10-12 LAB — TROPONIN I
Troponin I: 0.35 ng/mL — ABNORMAL HIGH (ref <0.031)
Troponin I: 0.42 ng/mL — ABNORMAL HIGH (ref <0.031)
Troponin I: 0.34 ng/mL — ABNORMAL HIGH (ref <0.031)

## 2014-10-12 LAB — BLOOD GAS, ARTERIAL
Acid-base deficit: 0.1 mmol/L (ref 0.0–2.0)
Acid-base deficit: 1.6 mmol/L (ref 0.0–2.0)
Acid-base deficit: 2 mmol/L (ref 0.0–2.0)
BICARBONATE: 26.3 meq/L — AB (ref 20.0–24.0)
Bicarbonate: 24 mEq/L (ref 20.0–24.0)
Bicarbonate: 23.1 mEq/L (ref 20.0–24.0)
DRAWN BY: 31814
DRAWN BY: 44138
Drawn by: 422461
FIO2: 0.6 %
FIO2: 1 %
FIO2: 1 %
LHR: 28 {breaths}/min
LHR: 28 {breaths}/min
MECHVT: 460 mL
MECHVT: 530 mL
O2 SAT: 99.2 %
O2 Saturation: 89.5 %
O2 Saturation: 95.7 %
PATIENT TEMPERATURE: 97.7
PCO2 ART: 42 mmHg (ref 35.0–45.0)
PEEP/CPAP: 5 cmH2O
PEEP: 8 cmH2O
PEEP: 8 cmH2O
PH ART: 7.322 — AB (ref 7.350–7.450)
PO2 ART: 203 mmHg — AB (ref 80.0–100.0)
PO2 ART: 64.3 mmHg — AB (ref 80.0–100.0)
PO2 ART: 84.1 mmHg (ref 80.0–100.0)
Patient temperature: 97.8
Patient temperature: 98.3
RATE: 20 resp/min
TCO2: 20.6 mmol/L (ref 0–100)
TCO2: 21.5 mmol/L (ref 0–100)
TCO2: 23.8 mmol/L (ref 0–100)
VT: 460 mL
pCO2 arterial: 45.1 mmHg — ABNORMAL HIGH (ref 35.0–45.0)
pCO2 arterial: 52.2 mmHg — ABNORMAL HIGH (ref 35.0–45.0)
pH, Arterial: 7.342 — ABNORMAL LOW (ref 7.350–7.450)
pH, Arterial: 7.357 (ref 7.350–7.450)

## 2014-10-12 LAB — BASIC METABOLIC PANEL
Anion gap: 10 (ref 5–15)
Anion gap: 9 (ref 5–15)
BUN: 23 mg/dL — ABNORMAL HIGH (ref 6–20)
BUN: 24 mg/dL — AB (ref 6–20)
CALCIUM: 8.9 mg/dL (ref 8.9–10.3)
CHLORIDE: 97 mmol/L — AB (ref 101–111)
CO2: 25 mmol/L (ref 22–32)
CO2: 27 mmol/L (ref 22–32)
CREATININE: 1.3 mg/dL — AB (ref 0.61–1.24)
CREATININE: 1.61 mg/dL — AB (ref 0.61–1.24)
Calcium: 8.8 mg/dL — ABNORMAL LOW (ref 8.9–10.3)
Chloride: 98 mmol/L — ABNORMAL LOW (ref 101–111)
GFR calc Af Amer: >60 mL/min (ref >60)
GFR calc non Af Amer: 57 mL/min — ABNORMAL LOW (ref >60)
GFR calc non Af Amer: 44 mL/min — ABNORMAL LOW (ref >60)
GFR, EST AFRICAN AMERICAN: 51 mL/min — AB (ref >60)
GLUCOSE: 110 mg/dL — AB (ref 65–99)
Glucose, Bld: 114 mg/dL — ABNORMAL HIGH (ref 65–99)
POTASSIUM: 5.2 mmol/L — AB (ref 3.5–5.1)
Potassium: 5.1 mmol/L (ref 3.5–5.1)
Sodium: 133 mmol/L — ABNORMAL LOW (ref 135–145)
Sodium: 133 mmol/L — ABNORMAL LOW (ref 135–145)

## 2014-10-12 LAB — CBC
HEMATOCRIT: 42.6 % (ref 39.0–52.0)
Hemoglobin: 14.1 g/dL (ref 13.0–17.0)
MCH: 36.5 pg — ABNORMAL HIGH (ref 26.0–34.0)
MCHC: 33.1 g/dL (ref 30.0–36.0)
MCV: 110.4 fL — AB (ref 78.0–100.0)
PLATELETS: 330 10*3/uL (ref 150–400)
RBC: 3.86 MIL/uL — AB (ref 4.22–5.81)
RDW: 14.5 % (ref 11.5–15.5)
WBC: 13.4 10*3/uL — AB (ref 4.0–10.5)

## 2014-10-12 LAB — MRSA PCR SCREENING: MRSA by PCR: NEGATIVE

## 2014-10-12 LAB — TYPE AND SCREEN
ABO/RH(D): O POS
Antibody Screen: NEGATIVE

## 2014-10-12 LAB — MAGNESIUM: MAGNESIUM: 2.1 mg/dL (ref 1.7–2.4)

## 2014-10-12 LAB — PHOSPHORUS: PHOSPHORUS: 4.7 mg/dL — AB (ref 2.5–4.6)

## 2014-10-12 LAB — LACTIC ACID, PLASMA: Lactic Acid, Venous: 1.6 mmol/L (ref 0.5–2.0)

## 2014-10-12 LAB — TRIGLYCERIDES: TRIGLYCERIDES: 73 mg/dL (ref <150)

## 2014-10-12 LAB — BRAIN NATRIURETIC PEPTIDE: B Natriuretic Peptide: 981.7 pg/mL — ABNORMAL HIGH (ref 0.0–100.0)

## 2014-10-12 LAB — ABO/RH: ABO/RH(D): O POS

## 2014-10-12 MED ORDER — POTASSIUM CHLORIDE 20 MEQ/15ML (10%) PO SOLN
40.0000 meq | Freq: Two times a day (BID) | ORAL | Status: DC
  Administered 2014-10-12 – 2014-10-14 (×4): 40 meq
  Filled 2014-10-12 (×4): qty 30

## 2014-10-12 MED ORDER — FUROSEMIDE 10 MG/ML IJ SOLN
40.0000 mg | Freq: Three times a day (TID) | INTRAMUSCULAR | Status: DC
  Administered 2014-10-12 – 2014-10-16 (×12): 40 mg via INTRAVENOUS
  Filled 2014-10-12 (×12): qty 4

## 2014-10-12 MED ORDER — SODIUM CHLORIDE 0.9 % IV SOLN: 250.0000 mL | INTRAVENOUS | Status: DC | PRN

## 2014-10-12 MED ORDER — FENTANYL CITRATE (PF) 100 MCG/2ML IJ SOLN
100.0000 ug | INTRAMUSCULAR | Status: DC | PRN
  Administered 2014-10-13 (×4): 100 ug via INTRAVENOUS
  Filled 2014-10-12 (×4): qty 2

## 2014-10-12 MED ORDER — PANTOPRAZOLE SODIUM 40 MG IV SOLR
40.0000 mg | Freq: Every day | INTRAVENOUS | Status: DC
  Administered 2014-10-12 – 2014-10-14 (×3): 40 mg via INTRAVENOUS
  Filled 2014-10-12 (×4): qty 40

## 2014-10-12 MED ORDER — PROPOFOL 1000 MG/100ML IV EMUL
5.0000 ug/kg/min | INTRAVENOUS | Status: DC
  Administered 2014-10-12: 35 ug/kg/min via INTRAVENOUS
  Administered 2014-10-12: 20 ug/kg/min via INTRAVENOUS
  Administered 2014-10-12 (×2): 40 ug/kg/min via INTRAVENOUS
  Administered 2014-10-13: 45 ug/kg/min via INTRAVENOUS
  Administered 2014-10-13: 35 ug/kg/min via INTRAVENOUS
  Administered 2014-10-13 (×2): 40 ug/kg/min via INTRAVENOUS
  Administered 2014-10-14: 65 ug/kg/min via INTRAVENOUS
  Filled 2014-10-12 (×11): qty 100

## 2014-10-12 MED ORDER — CETYLPYRIDINIUM CHLORIDE 0.05 % MT LIQD
7.0000 mL | Freq: Four times a day (QID) | OROMUCOSAL | Status: DC
  Administered 2014-10-12 – 2014-10-14 (×10): 7 mL via OROMUCOSAL

## 2014-10-12 MED ORDER — SPIRONOLACTONE 50 MG PO TABS
150.0000 mg | ORAL_TABLET | Freq: Every day | ORAL | Status: DC
  Filled 2014-10-12: qty 1

## 2014-10-12 MED ORDER — PROPOFOL 1000 MG/100ML IV EMUL
5.0000 ug/kg/min | Freq: Once | INTRAVENOUS | Status: AC
  Administered 2014-10-12: 60.827 ug/kg/min via INTRAVENOUS
  Filled 2014-10-12: qty 100

## 2014-10-12 MED ORDER — METOPROLOL TARTRATE 1 MG/ML IV SOLN
3.0000 mg | INTRAVENOUS | Status: DC | PRN
  Administered 2014-10-12: 5 mg via INTRAVENOUS
  Administered 2014-10-12: 3 mg via INTRAVENOUS
  Administered 2014-10-13 – 2014-10-14 (×5): 5 mg via INTRAVENOUS
  Filled 2014-10-12 (×7): qty 5

## 2014-10-12 MED ORDER — FENTANYL CITRATE (PF) 100 MCG/2ML IJ SOLN: 100.0000 ug | INTRAMUSCULAR | Status: DC | PRN

## 2014-10-12 MED ORDER — HEPARIN SODIUM (PORCINE) 5000 UNIT/ML IJ SOLN
5000.0000 [IU] | Freq: Three times a day (TID) | INTRAMUSCULAR | Status: DC
  Administered 2014-10-12 – 2014-10-17 (×18): 5000 [IU] via SUBCUTANEOUS
  Filled 2014-10-12 (×25): qty 1

## 2014-10-12 MED ORDER — NITROGLYCERIN IN D5W 200-5 MCG/ML-% IV SOLN
0.0000 ug/min | INTRAVENOUS | Status: DC
  Administered 2014-10-12: 50 ug/min via INTRAVENOUS
  Administered 2014-10-12: 190 ug/min via INTRAVENOUS
  Administered 2014-10-12: 150 ug/min via INTRAVENOUS
  Administered 2014-10-12: 120 ug/min via INTRAVENOUS
  Administered 2014-10-13 (×2): 180 ug/min via INTRAVENOUS
  Administered 2014-10-13: 170 ug/min via INTRAVENOUS
  Administered 2014-10-13: 190 ug/min via INTRAVENOUS
  Administered 2014-10-14: 145 ug/min via INTRAVENOUS
  Administered 2014-10-14: 150 ug/min via INTRAVENOUS
  Administered 2014-10-15: 140 ug/min via INTRAVENOUS
  Filled 2014-10-12 (×14): qty 250

## 2014-10-12 MED ORDER — CHLORHEXIDINE GLUCONATE 0.12 % MT SOLN
15.0000 mL | Freq: Two times a day (BID) | OROMUCOSAL | Status: DC
  Administered 2014-10-12 – 2014-10-14 (×5): 15 mL via OROMUCOSAL
  Filled 2014-10-12 (×4): qty 15

## 2014-10-12 NOTE — Progress Notes (Signed)
eLink Physician-Brief Progress Note Patient Name: Travis Romero DOB: [birth year] MRN: [id number]   Date of Service  10/12/2014  HPI/Events of Note  Best Practice  eICU Interventions  PPI for stress ulcer propy     Intervention Category Intermediate Interventions: Best-practice therapies (e.g. DVT, beta blocker, etc.)  Brendalyn Vallely 10/12/2014, 3:57 AM

## 2014-10-12 NOTE — Progress Notes (Signed)
S: re-reound. SBP 170, gets agitated. On diprivan and nitro gtt. Presented wioth SBP 200. Currently SBP 169 with MAP 124. Echo done presult pending  PULMONARY  Recent Labs Lab 10/11/14 2002 10/12/14 0028 10/12/14 0253 10/12/14 0442  PHART 7.262* 7.322* 7.342* 7.357  PCO2ART 58.5* 52.2* 45.1* 42.0  PO2ART 74.9* 64.3* 203* 84.1  HCO3 25.6* 26.3* 24.0 23.1  TCO2 23.0 23.8 21.5 20.6  O2SAT 90.6 89.5 99.2 95.7    CBC  Recent Labs Lab 10/11/14 1959 10/12/14 0427  HGB 15.7 14.1  HCT 47.4 42.6  WBC 10.2 13.4*  PLT 391 330    COAGULATION  Recent Labs Lab 10/11/14 1959  INR 0.91    CARDIAC   Recent Labs Lab 10/11/14 1959 10/12/14 0106 10/12/14 0710  TROPONINI 0.44* 0.35* 0.42*   No results for input(s): PROBNP in the last 168 hours.   CHEMISTRY  Recent Labs Lab 10/11/14 1959 10/12/14 0351  NA 131* 133*  K 5.1 5.1  CL 96* 98*  CO2 25 25  GLUCOSE 137* 110*  BUN 23* 23*  CREATININE 1.16 1.30*  CALCIUM 9.2 8.9  MG  --  2.1  PHOS  --  4.7*   Estimated Creatinine Clearance: 54.4 mL/min (by C-G formula based on Cr of 1.3).   LIVER  Recent Labs Lab 10/11/14 1959  AST 29  ALT 26  ALKPHOS 90  BILITOT 0.6  PROT 7.1  ALBUMIN 3.2*  INR 0.91     INFECTIOUS No results for input(s): LATICACIDVEN, PROCALCITON in the last 168 hours.   ENDOCRINE CBG (last 3)  No results for input(s): GLUCAP in the last 72 hours.       IMAGING x48h  - image(s) personally visualized  -   highlighted in bold Dg Chest Portable 1 View  10/11/2014   CLINICAL DATA:  64 year old male with shortness of breath. Endotracheal tube placement  EXAM: PORTABLE CHEST - 1 VIEW  COMPARISON:  10/11/2014 and prior studies  FINDINGS: Upper limits normal heart size noted.  An endotracheal tube is now identified with tip 4.8 cm above the carina.  Slightly increased interstitial and mild airspace opacities are noted bilaterally likely representing pulmonary edema.  Mild bibasilar  atelectasis is present.  There is no evidence of pneumothorax. No acute bony abnormalities are present.  IMPRESSION: Slightly increased interstitial and airspace opacities likely representing pulmonary edema.  Endotracheal tube with tip 4.8 cm above the carina.   Electronically Signed   By: Margarette Canada M.D.   On: 10/11/2014 22:25   Dg Chest Port 1 View  10/11/2014   CLINICAL DATA:  Shortness of breath.  COPD.  EXAM: PORTABLE CHEST - 1 VIEW  COMPARISON:  11/21/2012  FINDINGS: Changes of COPD are again demonstrated. New diffuse symmetric bilateral airspace disease is seen, consistent with diffuse pulmonary edema. Small bilateral pleural effusions also noted. Heart size remains within normal limits.  IMPRESSION: New diffuse symmetric bilateral airspace disease most likely due to diffuse pulmonary edema, with infection considered less likely.  New small bilateral pleural effusions.  COPD.   Electronically Signed   By: Earle Gell M.D.   On: 10/11/2014 20:30     A) Acute resp failure due to pulm edema Trop leak looks type 2 AKI Hypertensive urgency - sbp 170s - on nitrog gtt with sbop 160s  P Start lasix Continue diprivan SBP goal 150-160 Lopressor prn Continue nitro gtt  Check stat trop, bmet, lactici    Dr. Brand Males, M.D., [facility].C.P Pulmonary and Critical Care  Medicine Staff [facility] Pager: [phone], If no answer or between  15:00h - 7:00h: call [phone]  10/12/2014 1:25 PM

## 2014-10-12 NOTE — Progress Notes (Signed)
Pharmacy asked RN to ask MD about a redraw Potassium check as last value was 5.2. E-link MD called and said recheck in am on 7-10.

## 2014-10-12 NOTE — Progress Notes (Signed)
  Echocardiogram 2D Echocardiogram has been performed.  Lysle Rubens 10/12/2014, 11:43 AM

## 2014-10-12 NOTE — H&P (Signed)
PULMONARY / CRITICAL CARE MEDICINE HISTORY AND PHYSICAL EXAMINATION   Name: Travis Romero MRN: [id number] DOB: [birth year]    ADMISSION DATE:  10/11/2014  PRIMARY SERVICE: PCCM  CHIEF COMPLAINT:  <cannot obtain>  BRIEF PATIENT DESCRIPTION: 64 y/o man with Hx of HTN, COPD, and back pain presented with progressive dyspnea, found to have CHF exacerbation.  SIGNIFICANT EVENTS / STUDIES:  Intubated 7/8 in the ED for progressive respiratory failure.  LINES / TUBES: 7.5 mm ETT >> 7/7 OGT >> 7/7 Foley  >> 7/7  CULTURES: none  ANTIBIOTICS: None  HISTORY OF PRESENT ILLNESS:   Note, history is limited as patient is intubated. A call was made to emergency contact in chart, but no answer.  Travis Romero presented to the ED with progressive dyspnea of one week's duration. He has been taking combivent for COPD without improvement in her symptoms. He has been prescribed a muscle relaxant about a week ago for neck pain (flexeril and tramadol), and wondered if he was having a reaction to the same. He had not been having chest pain. He presented to the ED with progressive breathlessness and was placed on BiPAP. He became more dyspnic and was ultimately intubated. PCCM was consulted for admission.  PAST MEDICAL HISTORY :  Past Medical History  Diagnosis Date  . Hypertension   . COPD (chronic obstructive pulmonary disease)   . BPH (benign prostatic hyperplasia)   . Back pain 2007    after fall down steps   Past Surgical History  Procedure Laterality Date  . Tonsillectomy    . Pyloromyotomy      pyloric stenosis   Prior to Admission medications   Medication Sig Start Date End Date Taking? Authorizing Provider  ALPRAZolam (XANAX) 0.5 MG tablet TAKE 1 TABLET BY MOUTH 3 TIMES A DAY AS NEEDED FOR 90 DAYS 10/02/14  Yes Historical Provider, MD  benzonatate (TESSALON) 100 MG capsule Take 100-200 mg by mouth 3 (three) times daily as needed for cough.   Yes Historical Provider, MD  cloNIDine  (CATAPRES) 0.2 MG tablet Take 0.2 mg by mouth 4 (four) times daily as needed.    Yes Historical Provider, MD  dutasteride (AVODART) 0.5 MG capsule Take 0.5 mg by mouth daily. 07/29/14  Yes Historical Provider, MD  Ipratropium-Albuterol ([place]) Inhale into the lungs.   Yes Historical Provider, MD  isradipine (DYNACIRC) 5 MG capsule Take 10 mg by mouth 2 (two) times daily. 08/19/14  Yes Historical Provider, MD  mirtazapine (REMERON) 45 MG tablet Take 45 mg by mouth at bedtime.   Yes Historical Provider, MD  spironolactone (ALDACTONE) 100 MG tablet Take 150 mg by mouth daily.   Yes Historical Provider, MD  telmisartan (MICARDIS) 80 MG tablet Take 80 mg by mouth daily. 08/17/14  Yes Historical Provider, MD  traMADol (ULTRAM) 50 MG tablet Take 1 tablet (50 mg total) by mouth every 8 (eight) hours as needed. 10/07/14  Yes Chelle Jeffery, PA-C  cyclobenzaprine (FLEXERIL) 10 MG tablet Take 1 tablet (10 mg total) by mouth 3 (three) times daily as needed for muscle spasms. Patient not taking: Reported on 10/11/2014 10/07/14   Harrison Mons, PA-C   Allergies  Allergen Reactions  . Advair Diskus [Fluticasone-Salmeterol] Other (See Comments)    Exacerbated BPH   . Flexeril [Cyclobenzaprine] Other (See Comments)    Caused shortness of breath and swelling in scrotum    FAMILY HISTORY:  Family History  Problem Relation Age of Onset  . Hypertension Mother   . Heart disease  Father    SOCIAL HISTORY:  reports that he has been smoking Cigarettes.  He has smoked for the past 20 years. He has never used smokeless tobacco. He reports that he drinks about 1.2 - 1.8 oz of alcohol per week. He reports that he does not use illicit drugs.  REVIEW OF SYSTEMS:  Cannot assess  SUBJECTIVE:   VITAL SIGNS: Temp:  [97.7 F (36.5 C)-97.8 F (36.6 C)] 97.8 F (36.6 C) (07/09 0400) Pulse Rate:  [96-134] 96 (07/09 0400) Resp:  [17-28] 28 (07/09 0400) BP: (156-216)/(88-118) 175/118 mmHg (07/09 0400) SpO2:  [91 %-100  %] 99 % (07/09 0400) FiO2 (%):  [60 %-100 %] 60 % (07/09 0400) Weight:  [148 lb 13 oz (67.5 kg)] 148 lb 13 oz (67.5 kg) (07/09 0252) HEMODYNAMICS:   VENTILATOR SETTINGS: Vent Mode:  [-] PRVC FiO2 (%):  [60 %-100 %] 60 % Set Rate:  [20 bmp-28 bmp] 28 bmp Vt Set:  [460 mL-530 mL] 460 mL PEEP:  [5 cmH20-8 cmH20] 8 cmH20 Pressure Support:  [9 cmH20] 9 cmH20 Plateau Pressure:  [17 cmH20-20 cmH20] 20 cmH20 INTAKE / OUTPUT: Intake/Output      07/08 0701 - 07/09 0700   Other 3500   Total Output 3500   Net -3500         PHYSICAL EXAMINATION: General:  Thin man, intubated lying in stretcher Neuro:  Sedated, response only to noxious stimulus. HEENT:  MMM Neck: Gross JVD to mandible Cardiovascular:  RRR Lungs:  Crackles heard at the bilateral bases Abdomen:  Soft, non-distended Musculoskeletal:  No deformities Skin:  Intact  LABS:  CBC  Recent Labs Lab 10/11/14 1959  WBC 10.2  HGB 15.7  HCT 47.4  PLT 391   Coag's  Recent Labs Lab 10/11/14 1959  INR 0.91   BMET  Recent Labs Lab 10/11/14 1959  NA 131*  K 5.1  CL 96*  CO2 25  BUN 23*  CREATININE 1.16  GLUCOSE 137*   Electrolytes  Recent Labs Lab 10/11/14 1959  CALCIUM 9.2   Sepsis Markers No results for input(s): LATICACIDVEN, PROCALCITON, O2SATVEN in the last 168 hours. ABG  Recent Labs Lab 10/11/14 2002 10/12/14 0028 10/12/14 0253  PHART 7.262* 7.322* 7.342*  PCO2ART 58.5* 52.2* 45.1*  PO2ART 74.9* 64.3* 203*   Liver Enzymes  Recent Labs Lab 10/11/14 1959  AST 29  ALT 26  ALKPHOS 90  BILITOT 0.6  ALBUMIN 3.2*   Cardiac Enzymes  Recent Labs Lab 10/11/14 1959 10/12/14 0106  TROPONINI 0.44* 0.35*   Glucose No results for input(s): GLUCAP in the last 168 hours.  Imaging Dg Chest Portable 1 View  10/11/2014   CLINICAL DATA:  64 year old male with shortness of breath. Endotracheal tube placement  EXAM: PORTABLE CHEST - 1 VIEW  COMPARISON:  10/11/2014 and prior studies  FINDINGS:  Upper limits normal heart size noted.  An endotracheal tube is now identified with tip 4.8 cm above the carina.  Slightly increased interstitial and mild airspace opacities are noted bilaterally likely representing pulmonary edema.  Mild bibasilar atelectasis is present.  There is no evidence of pneumothorax. No acute bony abnormalities are present.  IMPRESSION: Slightly increased interstitial and airspace opacities likely representing pulmonary edema.  Endotracheal tube with tip 4.8 cm above the carina.   Electronically Signed   By: Margarette Canada M.D.   On: 10/11/2014 22:25   Dg Chest Port 1 View  10/11/2014   CLINICAL DATA:  Shortness of breath.  COPD.  EXAM: PORTABLE  CHEST - 1 VIEW  COMPARISON:  11/21/2012  FINDINGS: Changes of COPD are again demonstrated. New diffuse symmetric bilateral airspace disease is seen, consistent with diffuse pulmonary edema. Small bilateral pleural effusions also noted. Heart size remains within normal limits.  IMPRESSION: New diffuse symmetric bilateral airspace disease most likely due to diffuse pulmonary edema, with infection considered less likely.  New small bilateral pleural effusions.  COPD.   Electronically Signed   By: Earle Gell M.D.   On: 10/11/2014 20:30    EKG: NSR CXR: Pulmonary Edema  ASSESSMENT / PLAN:  Active Problems:   CHF exacerbation   PULMONARY A: Combined hypoxic and hypercarbic respiratory failure due to pulmonary edema COPD, without acute exacerbation P:   Will treat underlying CV problem. With lasix and BP control.  CARDIOVASCULAR A: CHF Exacerbation Hypertensive urgency P:   Echo pending Nitro for BP control.  RENAL A: Renal insufficiency HypoNA Hypophosphatemia P:   Unclear chronicity of renal insuffiencey; will diurese and follow, may improve with tx of CHF. Will monitor electrolytes, replete as necessary.  GASTROINTESTINAL A: No active issue P:     HEMATOLOGIC A: Lekocytosis P:   Likely reactive, will  follow.  INFECTIOUS A: No infectious issues  ENDOCRINE A: No active issues P:     NEUROLOGIC A: Need for sedation P:   Propofol, daily SBT  BEST PRACTICE / DISPOSITION Level of Care:  ICU Primary Service:  PCCM Consultants:  None Code Status:  Full Diet:  NPO DVT Px:  Hep GI Px:  None Skin Integrity:  Intact Social / Family:  Could not contact, need to be updated  TODAY'S SUMMARY: 64 y/o man with CHF exacerbation  I have personally obtained a history, examined the patient, evaluated laboratory and imaging results, formulated the assessment and plan and placed orders.  CRITICAL CARE: The patient is critically ill with multiple organ systems failure and requires high complexity decision making for assessment and support, frequent evaluation and titration of therapies, application of advanced monitoring technologies and extensive interpretation of multiple databases. Critical Care Time devoted to patient care services described in this note is 30 minutes.   Luz Brazen, MD Pulmonary and [place] Pager: [phone]   10/12/2014, 4:39 AM

## 2014-10-13 ENCOUNTER — Inpatient Hospital Stay (HOSPITAL_COMMUNITY): Payer: PRIVATE HEALTH INSURANCE

## 2014-10-13 DIAGNOSIS — J810 Acute pulmonary edema: Secondary | ICD-10-CM | POA: Insufficient documentation

## 2014-10-13 DIAGNOSIS — J9601 Acute respiratory failure with hypoxia: Secondary | ICD-10-CM

## 2014-10-13 DIAGNOSIS — Z978 Presence of other specified devices: Secondary | ICD-10-CM | POA: Insufficient documentation

## 2014-10-13 DIAGNOSIS — Z789 Other specified health status: Secondary | ICD-10-CM

## 2014-10-13 DIAGNOSIS — I5041 Acute combined systolic (congestive) and diastolic (congestive) heart failure: Secondary | ICD-10-CM

## 2014-10-13 LAB — COMPREHENSIVE METABOLIC PANEL
ALT: 15 U/L — ABNORMAL LOW (ref 17–63)
AST: 13 U/L — AB (ref 15–41)
Albumin: 2.4 g/dL — ABNORMAL LOW (ref 3.5–5.0)
Alkaline Phosphatase: 60 U/L (ref 38–126)
Anion gap: 10 (ref 5–15)
BUN: 21 mg/dL — AB (ref 6–20)
CALCIUM: 8.5 mg/dL — AB (ref 8.9–10.3)
CO2: 28 mmol/L (ref 22–32)
CREATININE: 1.61 mg/dL — AB (ref 0.61–1.24)
Chloride: 96 mmol/L — ABNORMAL LOW (ref 101–111)
GFR calc non Af Amer: 44 mL/min — ABNORMAL LOW (ref >60)
GFR, EST AFRICAN AMERICAN: 51 mL/min — AB (ref >60)
Glucose, Bld: 86 mg/dL (ref 65–99)
Potassium: 3.9 mmol/L (ref 3.5–5.1)
Sodium: 134 mmol/L — ABNORMAL LOW (ref 135–145)
Total Bilirubin: 0.4 mg/dL (ref 0.3–1.2)
Total Protein: 5.2 g/dL — ABNORMAL LOW (ref 6.5–8.1)

## 2014-10-13 LAB — BASIC METABOLIC PANEL
Anion gap: 8 (ref 5–15)
BUN: 23 mg/dL — AB (ref 6–20)
CO2: 27 mmol/L (ref 22–32)
Calcium: 8.4 mg/dL — ABNORMAL LOW (ref 8.9–10.3)
Chloride: 97 mmol/L — ABNORMAL LOW (ref 101–111)
Creatinine, Ser: 1.72 mg/dL — ABNORMAL HIGH (ref 0.61–1.24)
GFR calc Af Amer: 47 mL/min — ABNORMAL LOW (ref >60)
GFR, EST NON AFRICAN AMERICAN: 41 mL/min — AB (ref >60)
GLUCOSE: 110 mg/dL — AB (ref 65–99)
Potassium: 4.5 mmol/L (ref 3.5–5.1)
SODIUM: 132 mmol/L — AB (ref 135–145)

## 2014-10-13 LAB — PROTIME-INR
INR: 0.88 (ref 0.00–1.49)
Prothrombin Time: 12.2 seconds (ref 11.6–15.2)

## 2014-10-13 LAB — PHOSPHORUS: Phosphorus: 4.5 mg/dL (ref 2.5–4.6)

## 2014-10-13 LAB — RETICULOCYTES
RBC.: 3.54 MIL/uL — AB (ref 4.22–5.81)
RETIC COUNT ABSOLUTE: 88.5 10*3/uL (ref 19.0–186.0)
Retic Ct Pct: 2.5 % (ref 0.4–3.1)

## 2014-10-13 LAB — MAGNESIUM: MAGNESIUM: 1.9 mg/dL (ref 1.7–2.4)

## 2014-10-13 MED ORDER — IPRATROPIUM-ALBUTEROL 0.5-2.5 (3) MG/3ML IN SOLN
3.0000 mL | Freq: Four times a day (QID) | RESPIRATORY_TRACT | Status: DC
  Administered 2014-10-13 – 2014-10-15 (×9): 3 mL via RESPIRATORY_TRACT
  Filled 2014-10-13 (×9): qty 3

## 2014-10-13 MED ORDER — BUDESONIDE 0.25 MG/2ML IN SUSP
0.2500 mg | Freq: Two times a day (BID) | RESPIRATORY_TRACT | Status: DC
  Administered 2014-10-13 – 2014-10-15 (×5): 0.25 mg via RESPIRATORY_TRACT
  Filled 2014-10-13 (×5): qty 2

## 2014-10-13 MED ORDER — MIDAZOLAM HCL 2 MG/2ML IJ SOLN: 1.0000 mg | INTRAMUSCULAR | Status: DC | PRN

## 2014-10-13 NOTE — Progress Notes (Signed)
EorderedTT pulled out to the 25cm mark per Dr. Chase Caller. CXR ordered.

## 2014-10-13 NOTE — Consult Note (Signed)
Cardiology Consult Note  Admit date: 10/11/2014 Name: Travis Romero 64 y.o.  male DOB:  Jan 10, 1951 MRN:  [id number]  Today's date:  10/13/2014  Referring Physician:    Dr. Chase Caller  Primary Physician:   Unknown  Reason for Consultation:    New-onset congestive heart failure   IMPRESSIONS: 1. Acute systolic heart failure and pulmonary edema with cardiomyopathy-the severe LVH suggested hypertensive etiology although could be ischemic also. 2.  COPD and emphysema 3.  Severe hypertensive heart disease with congestive heart failure-the previous multiple drug regimens suggest long-standing hypertension 4.  Demand ischemia with mild elevation of troponin 5.  Macrocytosis suggests possible alcohol in the past but not able to give history 6.  Renal failure uncertain whether chronic or acute 7.  Proteinuria  RECOMMENDATION: Patient is currently intubated and sedated.  He will be difficult to give him and ACE inhibitor or ARB until we know what his renal function is doing.  Blood pressure is currently under reasonable control.  He has been adequately diuresed.  He will need to have an extensive workup when he is extubated and we can get better history.  He will require cardiac catheterization for etiology depending on renal function.  Begin ACE inhibitor and beta blocker when hemodynamically stable and abated.  Continue to diurese.  He will need evaluation of the macrocytosis and also suggest a liver panel.  HISTORY: This 64 year old male essentially has an unknown history.  He presented in acute respiratory failure and was intubated in the emergency room and thus we can obtain no history.  He was seen about a week ago with severe neck pain and prescribed Flexeril and tramadol.  He has evidently been on an inhaler for COPD in and does on a multidrug regimen for hypertension but unsure who his primary doctor is.  He was placed on BiPAP and eventually had to be intubated.  Initial chest x-ray showed  pulmonary edema.  Subsequent echocardiogram showed an EF of 20-25% with moderate to severe LVH.  Troponins have been minimally elevated.  No other history is obtainable.  Past Medical History  Diagnosis Date  . Hypertension   . COPD (chronic obstructive pulmonary disease)   . BPH (benign prostatic hyperplasia)   . Back pain 2007    after fall down steps      Past Surgical History  Procedure Laterality Date  . Tonsillectomy    . Pyloromyotomy      pyloric stenosis    Allergies:  is allergic to advair diskus and flexeril.   Medications: Prior to Admission medications   Medication Sig Start Date End Date Taking? Authorizing Provider  ALPRAZolam (XANAX) 0.5 MG tablet TAKE 1 TABLET BY MOUTH 3 TIMES A DAY AS NEEDED FOR 90 DAYS 10/02/14  Yes Historical Provider, MD  benzonatate (TESSALON) 100 MG capsule Take 100-200 mg by mouth 3 (three) times daily as needed for cough.   Yes Historical Provider, MD  cloNIDine (CATAPRES) 0.2 MG tablet Take 0.2 mg by mouth 4 (four) times daily as needed.    Yes Historical Provider, MD  dutasteride (AVODART) 0.5 MG capsule Take 0.5 mg by mouth daily. 07/29/14  Yes Historical Provider, MD  Ipratropium-Albuterol ([place]) Inhale into the lungs.   Yes Historical Provider, MD  isradipine (DYNACIRC) 5 MG capsule Take 10 mg by mouth 2 (two) times daily. 08/19/14  Yes Historical Provider, MD  mirtazapine (REMERON) 45 MG tablet Take 45 mg by mouth at bedtime.   Yes Historical Provider, MD  spironolactone (ALDACTONE)  100 MG tablet Take 150 mg by mouth daily.   Yes Historical Provider, MD  telmisartan (MICARDIS) 80 MG tablet Take 80 mg by mouth daily. 08/17/14  Yes Historical Provider, MD  traMADol (ULTRAM) 50 MG tablet Take 1 tablet (50 mg total) by mouth every 8 (eight) hours as needed. 10/07/14  Yes Chelle Jeffery, PA-C  cyclobenzaprine (FLEXERIL) 10 MG tablet Take 1 tablet (10 mg total) by mouth 3 (three) times daily as needed for muscle spasms. Patient not taking:  Reported on 10/11/2014 10/07/14   Harrison Mons, PA-C   Family History: Family Status  Relation Status Death Age  . Mother Deceased   . Father Deceased   . Sister Alive   . Brother Deceased 70    suicide  . Sister Alive   . Sister Alive    Social History:   reports that he has been smoking Cigarettes.  He has smoked for the past 20 years. He has never used smokeless tobacco. He reports that he drinks about 1.2 - 1.8 oz of alcohol per week. He reports that he does not use illicit drugs.   History   Social History Narrative   Lives alone.   A cousin, [name], lives nearby.    Review of Systems: Not obtainable because the patient is intubated.  Physical Exam: BP 129/79 mmHg  Pulse 87  Temp(Src) 98.2 F (36.8 C) (Axillary)  Resp 28  Ht 5\' 7"  (1.702 m)  Wt 67.7 kg (149 lb 4 oz)  BMI 23.37 kg/m2  SpO2 97%  General appearance: Middle-aged appearing male intubated sedated unable to obtain history no acute distress Head: Normocephalic, without obvious abnormality, atraumatic Eyes: conjunctivae/corneas clear. PERRL, EOM's intact. Fundi benign. Neck: no adenopathy, no carotid bruit, supple, symmetrical, trachea midline and Difficult to assess JVP Lungs: Increased leafy diameter, reduced breath sounds Heart: regular rate and rhythm, S1, S2 normal, no murmur, click, rub or gallop Abdomen: soft, non-tender; bowel sounds normal; no masses,  no organomegaly Rectal: deferred Extremities: extremities normal, atraumatic, no cyanosis or edema and Changes of chronic venous stasis noted on feet Pulses: 2+ and symmetric Skin: Skin color, texture, turgor normal. No rashes or lesions Neurologic: Grossly normal  Labs: CBC  Recent Labs  10/11/14 1959 10/12/14 0427  WBC 10.2 13.4*  RBC 4.32 3.86*  HGB 15.7 14.1  HCT 47.4 42.6  PLT 391 330  MCV 109.7* 110.4*  MCH 36.3* 36.5*  MCHC 33.1 33.1  RDW 14.4 14.5  LYMPHSABS 1.3  --   MONOABS 1.3*  --   EOSABS 0.1  --   BASOSABS 0.1  --     CMP   Recent Labs  10/11/14 1959  10/13/14 0359  NA 131*  < > 132*  K 5.1  < > 4.5  CL 96*  < > 97*  CO2 25  < > 27  GLUCOSE 137*  < > 110*  BUN 23*  < > 23*  CREATININE 1.16  < > 1.72*  CALCIUM 9.2  < > 8.4*  PROT 7.1  --   --   ALBUMIN 3.2*  --   --   AST 29  --   --   ALT 26  --   --   ALKPHOS 90  --   --   BILITOT 0.6  --   --   GFRNONAA >60  < > 41*  GFRAA >60  < > 47*  < > = values in this interval not displayed. BNP (last 3 results)  Component Value Date/Time   BNP 981.7* 10/12/2014 1337   Cardiac Panel (last 3 results) Cardiac Panel (last 3 results)  Recent Labs  10/12/14 0106 10/12/14 0710 10/12/14 1337  TROPONINI 0.35* 0.42* 0.34*     Radiology: Initial x-ray showed pulmonary edema.  Follow-up x-rays have shown stable emphysematous change.  EKG: Initial EKG showed right bundle branch block and sinus tachycardia with biatrial enlargement.  EKG today shows sinus rhythm.  There is now anterolateral T-wave inversions compatible with ischemia  Echo: Moderate severe LVH, EF 20-25%.  Severe global hypokinesis  Signed:  W. Doristine Church MD [facility]   Cardiology Consultant  10/13/2014, 4:31 PM

## 2014-10-13 NOTE — Progress Notes (Signed)
Pharmacist Heart Failure Core Measure Documentation  Assessment: Dakin Madani has an EF documented as 20-20% on 7/9 by ECHO.  Rationale: Heart failure patients with left ventricular systolic dysfunction (LVSD) and an EF < 40% should be prescribed an angiotensin converting enzyme inhibitor (ACEI) or angiotensin receptor blocker (ARB) at discharge unless a contraindication is documented in the medical record.  This patient is not currently on an ACEI or ARB for HF.  This note is being placed in the record in order to provide documentation that a contraindication to the use of these agents is present for this encounter.  ACE Inhibitor or Angiotensin Receptor Blocker is contraindicated (specify all that apply)  []   ACEI allergy AND ARB allergy []   Angioedema []   Moderate or severe aortic stenosis []   Hyperkalemia []   Hypotension []   Renal artery stenosis [x]   Worsening renal function, preexisting renal disease or dysfunction  Note patient on [facility] PTA but not continued as inpatient secondary to above  Kara Mead 10/13/2014 1:58 PM

## 2014-10-13 NOTE — Progress Notes (Signed)
PULMONARY / CRITICAL CARE MEDICINE HISTORY AND PHYSICAL EXAMINATION   Name: Travis Romero MRN: [id number] DOB: 1950/05/30    ADMISSION DATE:  10/11/2014  PRIMARY SERVICE: PCCM  CHIEF COMPLAINT:  <cannot obtain>  BRIEF PATIENT DESCRIPTION: 64 y/o man with Hx of HTN (on clonidine, catapres, micardis, aldactone, isradipiinne), COPD (atrovent) , and back pain (tramadol, flexeril, xanax - new prescritpion 1 week prior),  And ETOH Use  presented with progressive dyspnea, found to have CHF exacerbation and sever hight bp with pulomonary edema  SIGNIFICANT EVENTS / STUDIES:  Intubated 7/8 in the ED for progressive respiratory failure.  LINES / TUBES: 7.5 mm ETT >> 7/7 OGT >> 7/7 Foley  >> 7/7  CULTURES: none  ANTIBIOTICS: None   SUBJECTIVE:   10/13/14: Wanting extubation . Off diprivan. Still on nitro gtt + loperssor prn- sbp 160. ECHO wth NEW LVEF range of 20% to 25%. Severe diffuse hypokinesis. RT says he is looking tired with SBT; RN agrees. Creat rising  VITAL SIGNS: Temp:  [98.2 F (36.8 C)-99.6 F (37.6 C)] 98.7 F (37.1 C) (07/10 0800) Pulse Rate:  [84-105] 87 (07/10 0900) Resp:  [22-28] 28 (07/10 0900) BP: (121-181)/(72-118) 181/80 mmHg (07/10 0900) SpO2:  [99 %-100 %] 99 % (07/10 0900) FiO2 (%):  [40 %-60 %] 40 % (07/10 0900) Weight:  [67.7 kg (149 lb 4 oz)] 67.7 kg (149 lb 4 oz) (07/10 0401) HEMODYNAMICS:   VENTILATOR SETTINGS: Vent Mode:  [-] PRVC FiO2 (%):  [40 %-60 %] 40 % Set Rate:  [28 bmp] 28 bmp Vt Set:  [460 mL] 460 mL PEEP:  [5 cmH20-8 cmH20] 5 cmH20 Plateau Pressure:  [16 cmH20-19 cmH20] 16 cmH20 INTAKE / OUTPUT: Intake/Output      07/09 0701 - 07/10 0700 07/10 0701 - 07/11 0700   I.V. (mL/kg) 2792.4 (41.2) 328.4 (4.9)   Other 345    NG/GT 90 30   Total Intake(mL/kg) 3227.4 (47.7) 358.4 (5.3)   Urine (mL/kg/hr) 1140 (0.7) 700 (3.2)   Emesis/NG output 175 (0.1)    Other     Total Output 1315 700   Net +1912.4 -341.6          PHYSICAL  EXAMINATION: General:  Thin man, in bed. Looks unwell Neuro:  RASS +1 to +2 - uncomfortable. Moves all 4s (off diprivan). Followed some commands HEENT:  MMM Neck:  ET tube + Cardiovascular:  RRR Lungs:  Crackles heard at the bilateral bases. Tired looking on SBT Abdomen:  Soft, non-distended Musculoskeletal:  No deformities Skin:  Intact  LABS:  PULMONARY  Recent Labs Lab 10/11/14 2002 10/12/14 0028 10/12/14 0253 10/12/14 0442  PHART 7.262* 7.322* 7.342* 7.357  PCO2ART 58.5* 52.2* 45.1* 42.0  PO2ART 74.9* 64.3* 203* 84.1  HCO3 25.6* 26.3* 24.0 23.1  TCO2 23.0 23.8 21.5 20.6  O2SAT 90.6 89.5 99.2 95.7    CBC  Recent Labs Lab 10/11/14 1959 10/12/14 0427  HGB 15.7 14.1  HCT 47.4 42.6  WBC 10.2 13.4*  PLT 391 330    COAGULATION  Recent Labs Lab 10/11/14 1959  INR 0.91    CARDIAC   Recent Labs Lab 10/11/14 1959 10/12/14 0106 10/12/14 0710 10/12/14 1337  TROPONINI 0.44* 0.35* 0.42* 0.34*   No results for input(s): PROBNP in the last 168 hours.   CHEMISTRY  Recent Labs Lab 10/11/14 1959 10/12/14 0351 10/12/14 1337 10/13/14 0359  NA 131* 133* 133* 132*  K 5.1 5.1 5.2* 4.5  CL 96* 98* 97* 97*  CO2 25 25 27  27  GLUCOSE 137* 110* 114* 110*  BUN 23* 23* 24* 23*  CREATININE 1.16 1.30* 1.61* 1.72*  CALCIUM 9.2 8.9 8.8* 8.4*  MG  --  2.1  --  1.9  PHOS  --  4.7*  --  4.5   Estimated Creatinine Clearance: 41.1 mL/min (by C-G formula based on Cr of 1.72).   LIVER  Recent Labs Lab 10/11/14 1959  AST 29  ALT 26  ALKPHOS 90  BILITOT 0.6  PROT 7.1  ALBUMIN 3.2*  INR 0.91     INFECTIOUS  Recent Labs Lab 10/12/14 1300  LATICACIDVEN 1.6     ENDOCRINE CBG (last 3)  No results for input(s): GLUCAP in the last 72 hours.       IMAGING x48h  - image(s) personally visualized  -   highlighted in bold Dg Chest Port 1 View  10/12/2014   CLINICAL DATA:  Endotracheal tube placement.  EXAM: PORTABLE CHEST - 1 VIEW; PORTABLE ABDOMEN - 1  VIEW  COMPARISON:  10/11/2014  FINDINGS: Chest x-ray:  The endotracheal tube is in good position at the mid tracheal level approximately 6 cm above the carina. Stable advanced emphysematous changes with bullous changes in the right upper lobe. Persistent but improved edema and atelectasis. No pleural effusion.  Abdominal radiograph:  Moderate stool throughout the colon and down into the rectum may suggest constipation. No findings for small bowel obstruction or free air. The NG tube has been advanced since the chest x-ray. The tip is in the body region of the stomach.  IMPRESSION: Overall improved lung aeration with resolving edema and atelectasis.  Stable underlying emphysematous changes and pulmonary scarring.  The NG tube and ET tubes are in good position.  No plain film findings for an acute abdominal process. Possible mild constipation.   Electronically Signed   By: Marijo Sanes M.D.   On: 10/12/2014 14:24   Dg Chest Portable 1 View  10/11/2014   CLINICAL DATA:  64 year old male with shortness of breath. Endotracheal tube placement  EXAM: PORTABLE CHEST - 1 VIEW  COMPARISON:  10/11/2014 and prior studies  FINDINGS: Upper limits normal heart size noted.  An endotracheal tube is now identified with tip 4.8 cm above the carina.  Slightly increased interstitial and mild airspace opacities are noted bilaterally likely representing pulmonary edema.  Mild bibasilar atelectasis is present.  There is no evidence of pneumothorax. No acute bony abnormalities are present.  IMPRESSION: Slightly increased interstitial and airspace opacities likely representing pulmonary edema.  Endotracheal tube with tip 4.8 cm above the carina.   Electronically Signed   By: Margarette Canada M.D.   On: 10/11/2014 22:25   Dg Chest Port 1 View  10/11/2014   CLINICAL DATA:  Shortness of breath.  COPD.  EXAM: PORTABLE CHEST - 1 VIEW  COMPARISON:  11/21/2012  FINDINGS: Changes of COPD are again demonstrated. New diffuse symmetric bilateral  airspace disease is seen, consistent with diffuse pulmonary edema. Small bilateral pleural effusions also noted. Heart size remains within normal limits.  IMPRESSION: New diffuse symmetric bilateral airspace disease most likely due to diffuse pulmonary edema, with infection considered less likely.  New small bilateral pleural effusions.  COPD.   Electronically Signed   By: Earle Gell M.D.   On: 10/11/2014 20:30   Dg Abd Portable 1v  10/12/2014   CLINICAL DATA:  Endotracheal tube placement.  EXAM: PORTABLE CHEST - 1 VIEW; PORTABLE ABDOMEN - 1 VIEW  COMPARISON:  10/11/2014  FINDINGS: Chest x-ray:  The endotracheal tube is in good position at the mid tracheal level approximately 6 cm above the carina. Stable advanced emphysematous changes with bullous changes in the right upper lobe. Persistent but improved edema and atelectasis. No pleural effusion.  Abdominal radiograph:  Moderate stool throughout the colon and down into the rectum may suggest constipation. No findings for small bowel obstruction or free air. The NG tube has been advanced since the chest x-ray. The tip is in the body region of the stomach.  IMPRESSION: Overall improved lung aeration with resolving edema and atelectasis.  Stable underlying emphysematous changes and pulmonary scarring.  The NG tube and ET tubes are in good position.  No plain film findings for an acute abdominal process. Possible mild constipation.   Electronically Signed   By: Marijo Sanes M.D.   On: 10/12/2014 14:24        ASSESSMENT / PLAN:  Active Problems:   CHF exacerbation   PULMONARY A: Combined hypoxic and hypercarbic respiratory failure due to pulmonary edema   - due to new diagnosis of acute systolic CHF with hypertensive urgencye COPD, without acute exacerbation    - failing SBT  P:   Full vent support   CARDIOVASCULAR A: Hypertensive urgency @ admit 12/07/7167 Acute systolic CHF - new diagnosis 7/11/6 on echo   - ongoing evidence of pulm  edema + with ET tube secretions. BP barely 160sbp with nitro gtt + lopressor prn  P:   Lopressor prn + Nitro gtt for BP control. Continue aggressive lasix Cards consult - being paged  RENAL A: AKI at admit 10/11/2014   - worsening 10/13/2014 with lasix    P:   Continue lasix; monitor Will monitor electrolytes, replete as necessary.  GASTROINTESTINAL A: No active issue P:   Start TF if still intubated 10/14/14   HEMATOLOGIC A: Lekocytosis P:   Likely reactive, will follow.  INFECTIOUS A: No infectious issues  ENDOCRINE A: No active issues P:     NEUROLOGIC A: Need for sedation. Has back pain P:   Propofol, daily SBT fent prn Versed prn start  [place] / DISPOSITION Level of Care:  ICU Primary Service:  PCCM Consultants:  Cards Dr Lovena Le called 10/13/2014 Code Status:  Full Diet:  NPO DVT Px:  Hep GI Px:  None Skin Integrity:  Intact Social / Family:  Could not contact 10/11/2014. None at bedside 10/12/14 and 10/13/14   TODAY'S SUMMARY: 64 y/o man with acute systolic  CHF exacerbation with associated high bp. Cards called. No extubation 10/13/2014      The patient is critically ill with multiple organ systems failure and requires high complexity decision making for assessment and support, frequent evaluation and titration of therapies, application of advanced monitoring technologies and extensive interpretation of multiple databases.   Critical Care Time devoted to patient care services described in this note is  35  Minutes. This time reflects time of care of this signee Dr Brand Males. This critical care time does not reflect procedure time, or teaching time or supervisory time of PA/NP/Med student/Med Resident etc but could involve care discussion time    Dr. Brand Males, M.D., [facility].C.P Pulmonary and Critical Care Medicine Staff Physician [facility] Pager: [phone], If no answer or  between  15:00h - 7:00h: call [phone]  10/13/2014 10:35 AM

## 2014-10-14 ENCOUNTER — Inpatient Hospital Stay (HOSPITAL_COMMUNITY): Payer: PRIVATE HEALTH INSURANCE

## 2014-10-14 LAB — FOLATE: FOLATE: 15.4 ng/mL (ref >5.9)

## 2014-10-14 LAB — CBC WITH DIFFERENTIAL/PLATELET
BASOS ABS: 0.1 10*3/uL (ref 0.0–0.1)
Basophils Relative: 1 % (ref 0–1)
EOS PCT: 1 % (ref 0–5)
Eosinophils Absolute: 0.1 10*3/uL (ref 0.0–0.7)
HCT: 39.6 % (ref 39.0–52.0)
Hemoglobin: 13.3 g/dL (ref 13.0–17.0)
LYMPHS ABS: 0.9 10*3/uL (ref 0.7–4.0)
Lymphocytes Relative: 8 % — ABNORMAL LOW (ref 12–46)
MCH: 36.6 pg — AB (ref 26.0–34.0)
MCHC: 33.6 g/dL (ref 30.0–36.0)
MCV: 109.1 fL — ABNORMAL HIGH (ref 78.0–100.0)
MONO ABS: 2.2 10*3/uL — AB (ref 0.1–1.0)
MONOS PCT: 22 % — AB (ref 3–12)
Neutro Abs: 7 10*3/uL (ref 1.7–7.7)
Neutrophils Relative %: 68 % (ref 43–77)
Platelets: 289 10*3/uL (ref 150–400)
RBC: 3.63 MIL/uL — ABNORMAL LOW (ref 4.22–5.81)
RDW: 14.6 % (ref 11.5–15.5)
WBC: 10.2 10*3/uL (ref 4.0–10.5)

## 2014-10-14 LAB — BASIC METABOLIC PANEL
ANION GAP: 9 (ref 5–15)
BUN: 19 mg/dL (ref 6–20)
CALCIUM: 8.4 mg/dL — AB (ref 8.9–10.3)
CHLORIDE: 94 mmol/L — AB (ref 101–111)
CO2: 30 mmol/L (ref 22–32)
Creatinine, Ser: 1.59 mg/dL — ABNORMAL HIGH (ref 0.61–1.24)
GFR calc Af Amer: 52 mL/min — ABNORMAL LOW (ref >60)
GFR calc non Af Amer: 45 mL/min — ABNORMAL LOW (ref >60)
GLUCOSE: 92 mg/dL (ref 65–99)
POTASSIUM: 4 mmol/L (ref 3.5–5.1)
SODIUM: 133 mmol/L — AB (ref 135–145)

## 2014-10-14 LAB — MAGNESIUM: MAGNESIUM: 1.9 mg/dL (ref 1.7–2.4)

## 2014-10-14 LAB — FERRITIN: Ferritin: 34 ng/mL (ref 24–336)

## 2014-10-14 LAB — IRON AND TIBC
Iron: 18 ug/dL — ABNORMAL LOW (ref 45–182)
Saturation Ratios: 7 % — ABNORMAL LOW (ref 17.9–39.5)
TIBC: 276 ug/dL (ref 250–450)
UIBC: 258 ug/dL

## 2014-10-14 LAB — VITAMIN B12: Vitamin B-12: 1384 pg/mL — ABNORMAL HIGH (ref 180–914)

## 2014-10-14 LAB — PHOSPHORUS: Phosphorus: 3.9 mg/dL (ref 2.5–4.6)

## 2014-10-14 MED ORDER — CETYLPYRIDINIUM CHLORIDE 0.05 % MT LIQD
7.0000 mL | Freq: Two times a day (BID) | OROMUCOSAL | Status: DC
  Administered 2014-10-14 – 2014-10-16 (×4): 7 mL via OROMUCOSAL

## 2014-10-14 MED ORDER — ALPRAZOLAM 0.5 MG PO TABS
0.5000 mg | ORAL_TABLET | Freq: Four times a day (QID) | ORAL | Status: DC | PRN
  Administered 2014-10-15 (×2): 0.5 mg via ORAL
  Filled 2014-10-14 (×2): qty 1

## 2014-10-14 MED ORDER — NICARDIPINE HCL IN NACL 20-0.86 MG/200ML-% IV SOLN
3.0000 mg/h | INTRAVENOUS | Status: DC
  Administered 2014-10-14: 7.5 mg/h via INTRAVENOUS
  Administered 2014-10-14 (×2): 5 mg/h via INTRAVENOUS
  Administered 2014-10-14: 7.5 mg/h via INTRAVENOUS
  Administered 2014-10-15: 4.5 mg/h via INTRAVENOUS
  Administered 2014-10-15: 7.5 mg/h via INTRAVENOUS
  Filled 2014-10-14 (×10): qty 200

## 2014-10-14 MED ORDER — ALPRAZOLAM 0.5 MG PO TABS
0.5000 mg | ORAL_TABLET | Freq: Three times a day (TID) | ORAL | Status: DC | PRN
  Administered 2014-10-14 (×2): 0.5 mg via ORAL
  Filled 2014-10-14 (×2): qty 1

## 2014-10-14 MED ORDER — POTASSIUM CHLORIDE CRYS ER 20 MEQ PO TBCR
40.0000 meq | EXTENDED_RELEASE_TABLET | Freq: Two times a day (BID) | ORAL | Status: DC | PRN
  Administered 2014-10-14: 40 meq via ORAL
  Filled 2014-10-14: qty 2

## 2014-10-14 MED ORDER — CLONIDINE HCL 0.2 MG/24HR TD PTWK
0.2000 mg | MEDICATED_PATCH | TRANSDERMAL | Status: DC
  Administered 2014-10-14 – 2014-10-21 (×2): 0.2 mg via TRANSDERMAL
  Filled 2014-10-14 (×2): qty 1

## 2014-10-14 NOTE — Progress Notes (Signed)
Utilization review completed.  

## 2014-10-14 NOTE — Plan of Care (Signed)
Problem: Phase II Progression Outcomes Goal: Date pt extubated/weaned off vent Outcome: Completed/Met Date Met:  10/14/14 10/14/14 Goal: Time pt extubated/weaned off vent Outcome: Completed/Met Date Met:  10/14/14 1100

## 2014-10-14 NOTE — Progress Notes (Signed)
RT instructed patient on flutter valve use. Patient able to demonstrate proper technique back. Attempt was a little weak. RT coached to improve effort.  RT will continue to monitor.

## 2014-10-14 NOTE — Procedures (Signed)
Extubation Procedure Note  Patient Details:   Name: Travis Romero DOB: [birth year] MRN: [id number]   Airway Documentation:  Airway 7.5 mm (Active)  Secured at (cm) 23 cm 10/14/2014  7:28 AM  Measured From Lips 10/14/2014  7:28 AM  Secured Location Right 10/14/2014  7:28 AM  Secured By [employer] 10/14/2014  7:28 AM  Tube Holder Repositioned Yes 10/14/2014  7:28 AM  Site Condition Dry 10/12/2014  1:00 PM    Evaluation  O2 sats: stable throughout Complications: No apparent complications Patient did tolerate procedure well. Bilateral Breath Sounds: Clear, Diminished Suctioning: Oral, Airway Yes  Patient able to speak. Sat fell to 88% with [name] 5 Lpm, patient switched to a venturi mask on 8 Lpm 40% and Sat increased to 93%.  Vitals - HR 109, RR 25, Sat 93%.  RT will continue to monitor.  [name] 10/14/2014, 11:17 AM

## 2014-10-14 NOTE — Progress Notes (Signed)
Initial Nutrition Assessment  DOCUMENTATION CODES:  Not applicable  INTERVENTION: - Will monitor for need for nutrition support   NUTRITION DIAGNOSIS:  Inadequate oral intake related to inability to eat as evidenced by NPO status.  GOAL:  Patient will meet greater than or equal to 90% of their needs  MONITOR:  Vent status, Weight trends, Labs, I & O's  REASON FOR ASSESSMENT:  Ventilator  ASSESSMENT: Presented to the ED with progressive dyspnea of one week's duration. He has been taking combivent for COPD without improvement in her symptoms. He has been prescribed a muscle relaxant about a week ago for neck pain (flexeril and tramadol), and wondered if he was having a reaction to the same. He had not been having chest pain. He presented to the ED with progressive breathlessness and was placed on BiPAP. He became more dyspnic and was ultimately intubated.   Pt seen for new vent. BMI indicates normal weight status. Physical assessment of upper body shows some mild muscle wasting; unable to perform assessment on lower body as pt was kicking legs when attempted.   Patient is currently intubated on ventilator support MV: 13.4 L/min Temp (24hrs), Avg:98.1 F (36.7 C), Min:97.3 F (36.3 C), Max:98.9 F (37.2 C)  Propofol: 6.1 ml/hr (161 kcal)  Pulmonary note this AM hopeful for extubation tomorrow AM; if unable to extubate will assess for need for TF. OGT in place.  Not able to meet needs. No family present to obtain information PTA. Weight hx review indicates 9 lb weight loss (6% body weight) in 7 days. Will monitor for further fluctuations during admission. Medications reviewed. Labs reviewed; Na: 133 mmol/L, Cl: 94 mmol/L, creatinine elevated, Ca: 8.4 mg/dL, GFR: 45.   Height:  Ht Readings from Last 1 Encounters:  10/12/14 5\' 7"  (1.702 m)    Weight:  Wt Readings from Last 1 Encounters:  10/14/14 142 lb 6.7 oz (64.6 kg)    Ideal Body Weight:  67.3 kg (kg)  Wt  Readings from Last 10 Encounters:  10/14/14 142 lb 6.7 oz (64.6 kg)  10/07/14 151 lb (68.493 kg)    BMI:  Body mass index is 22.3 kg/(m^2).  Estimated Nutritional Needs:  Kcal:  6834  Protein:  77-97 grams  Fluid:  2 L/day  Skin:  Reviewed, no issues  Diet Order:  Diet NPO time specified  EDUCATION NEEDS:  No education needs identified at this time   Intake/Output Summary (Last 24 hours) at 10/14/14 1014 Last data filed at 10/14/14 1000  Gross per 24 hour  Intake 1530.37 ml  Output   5375 ml  Net -3844.63 ml    Last BM:  PTA     Jarome Matin, RD, LDN Inpatient Clinical Dietitian Pager # [phone] After hours/weekend pager # [phone]

## 2014-10-14 NOTE — Progress Notes (Signed)
PULMONARY / CRITICAL CARE MEDICINE HISTORY AND PHYSICAL EXAMINATION   Name: Travis Romero MRN: [id number] DOB: 1950-05-15    ADMISSION DATE:  10/11/2014  PRIMARY SERVICE: PCCM  CHIEF COMPLAINT:  Short of breath  BRIEF PATIENT DESCRIPTION:  64 yo male smoker with hx of refractory HTN presented with progressive dyspnea from pulmonary edema and combined acute on chronic heart failure.  He also has hx of COPD, chronic pain and alcohol abuse.  SIGNIFICANT EVENTS:  7/08 Admit 7/10 Cardiology consulted 7/11 Extubated  STUDIES: 7/09 Echo >> severe LVH, EF 20 to 12%, grade 1 diastolic dysfx, PAS 35 mmHg  SUBJECTIVE:  Tolerating SBT.  VITAL SIGNS: Temp:  [97.8 F (36.6 C)-98.9 F (37.2 C)] 97.8 F (36.6 C) (07/11 0400) Pulse Rate:  [79-100] 97 (07/11 0800) Resp:  [7-28] 17 (07/11 0800) BP: (129-190)/(77-112) 185/106 mmHg (07/11 0845) SpO2:  [95 %-100 %] 99 % (07/11 0800) FiO2 (%):  [40 %] 40 % (07/11 0804) Weight:  [64.6 kg (142 lb 6.7 oz)] 64.6 kg (142 lb 6.7 oz) (07/11 0400) VENTILATOR SETTINGS: Vent Mode:  [-] PRVC FiO2 (%):  [40 %] 40 % Set Rate:  [28 bmp] 28 bmp Vt Set:  [460 mL] 460 mL PEEP:  [5 cmH20] 5 cmH20 Pressure Support:  [5 cmH20] 5 cmH20 Plateau Pressure:  [13 cmH20-14 cmH20] 13 cmH20 INTAKE / OUTPUT: Intake/Output      07/10 0701 - 07/11 0700 07/11 0701 - 07/12 0700   I.V. (mL/kg) 1714 (26.5) 112.3 (1.7)   Other  10   NG/GT 30 30   Total Intake(mL/kg) 1744 (27) 152.3 (2.4)   Urine (mL/kg/hr) 4175 (2.7) 950 (7.5)   Emesis/NG output 1050 (0.7)    Total Output 5225 950   Net -3481 -797.7          PHYSICAL EXAMINATION: General:  Thin man lying in bed Neuro:  RASS -1 Follows commands, Moves all 4s (on low dose propofol).  HEENT: 3mm PERRL brisk bilaterally, ETT in place Neck: minimal JVD appreciated Cardiovascular:  RRR,  Lungs:  Crackles heard at the bilateral bases Abdomen:  Soft, non-distended, nontender, no hepato or  spleenomegaly Musculoskeletal:  No deformities Skin:  Bilat. LE brawniness   LABS:  PULMONARY  Recent Labs Lab 10/11/14 2002 10/12/14 0028 10/12/14 0253 10/12/14 0442  PHART 7.262* 7.322* 7.342* 7.357  PCO2ART 58.5* 52.2* 45.1* 42.0  PO2ART 74.9* 64.3* 203* 84.1  HCO3 25.6* 26.3* 24.0 23.1  TCO2 23.0 23.8 21.5 20.6  O2SAT 90.6 89.5 99.2 95.7    CBC  Recent Labs Lab 10/11/14 1959 10/12/14 0427 10/14/14 0405  HGB 15.7 14.1 13.3  HCT 47.4 42.6 39.6  WBC 10.2 13.4* 10.2  PLT 391 330 289    COAGULATION  Recent Labs Lab 10/11/14 1959 10/13/14 1736  INR 0.91 0.88    CARDIAC    Recent Labs Lab 10/11/14 1959 10/12/14 0106 10/12/14 0710 10/12/14 1337  TROPONINI 0.44* 0.35* 0.42* 0.34*   No results for input(s): PROBNP in the last 168 hours.   CHEMISTRY  Recent Labs Lab 10/12/14 0351 10/12/14 1337 10/13/14 0359 10/13/14 1736 10/14/14 0405  NA 133* 133* 132* 134* 133*  K 5.1 5.2* 4.5 3.9 4.0  CL 98* 97* 97* 96* 94*  CO2 25 27 27 28 30   GLUCOSE 110* 114* 110* 86 92  BUN 23* 24* 23* 21* 19  CREATININE 1.30* 1.61* 1.72* 1.61* 1.59*  CALCIUM 8.9 8.8* 8.4* 8.5* 8.4*  MG 2.1  --  1.9  --  1.9  PHOS 4.7*  --  4.5  --  3.9   Estimated Creatinine Clearance: 43.5 mL/min (by C-G formula based on Cr of 1.59).   LIVER  Recent Labs Lab 10/11/14 1959 10/13/14 1736  AST 29 13*  ALT 26 15*  ALKPHOS 90 60  BILITOT 0.6 0.4  PROT 7.1 5.2*  ALBUMIN 3.2* 2.4*  INR 0.91 0.88     INFECTIOUS  Recent Labs Lab 10/12/14 1300  LATICACIDVEN 1.6    Imaging Dg Chest 1 View  10/13/2014   CLINICAL DATA:  Repositioning of endotracheal tube.  EXAM: CHEST  1 VIEW  COMPARISON:  Earlier film, same date.  FINDINGS: The endotracheal tube is an good position, is 6 cm above the carina. The heart and lungs are stable. Stable advanced emphysematous changes.  IMPRESSION: Repositioning of the endotracheal to which is now at the mid tracheal level, 6 cm above the  carina.   Electronically Signed   By: Marijo Sanes M.D.   On: 10/13/2014 13:54   Dg Chest Port 1 View  10/14/2014   CLINICAL DATA:  COPD, check endotracheal tube placement  EXAM: PORTABLE CHEST - 1 VIEW  COMPARISON:  10/13/2014  FINDINGS: Cardiac shadow is stable. Endotracheal tube is noted 6 cm above the carina in stable position. A nasogastric catheter extends into the stomach. Enlarging right-sided pleural effusion is noted. Emphysematous changes are again seen in the right upper lobe. Old rib fractures on the right are again seen. No focal confluent infiltrate is noted.  IMPRESSION: Increasing right-sided pleural effusion.  Tubes and lines as described.  Stable COPD   Electronically Signed   By: Inez Catalina M.D.   On: 10/14/2014 07:17   Dg Chest Port 1 View  10/13/2014   CLINICAL DATA:  Respiratory distress.  EXAM: PORTABLE CHEST - 1 VIEW  COMPARISON:  10/12/2014  FINDINGS: The endotracheal tube is at the carina and possibly just into the right mainstem bronchus. It should be retracted 4-5 cm. The NG tube is stable. The cardiac silhouette, mediastinal and hilar contours are stable. Stable emphysematous changes and lower lobe compressive atelectasis but no infiltrates or effusions.  IMPRESSION: The endotracheal tube tip is near the entrance of the right mainstem bronchus. It should be retracted approximately 4 cm.  No significant/acute pulmonary findings. Stable emphysematous changes.  These results will be called to the ordering clinician or representative by the Radiologist Assistant, and communication documented in the PACS or zVision Dashboard.   Electronically Signed   By: Marijo Sanes M.D.   On: 10/13/2014 12:10   Dg Chest Port 1 View  10/12/2014   CLINICAL DATA:  Endotracheal tube placement.  EXAM: PORTABLE CHEST - 1 VIEW; PORTABLE ABDOMEN - 1 VIEW  COMPARISON:  10/11/2014  FINDINGS: Chest x-ray:  The endotracheal tube is in good position at the mid tracheal level approximately 6 cm above the  carina. Stable advanced emphysematous changes with bullous changes in the right upper lobe. Persistent but improved edema and atelectasis. No pleural effusion.  Abdominal radiograph:  Moderate stool throughout the colon and down into the rectum may suggest constipation. No findings for small bowel obstruction or free air. The NG tube has been advanced since the chest x-ray. The tip is in the body region of the stomach.  IMPRESSION: Overall improved lung aeration with resolving edema and atelectasis.  Stable underlying emphysematous changes and pulmonary scarring.  The NG tube and ET tubes are in good position.  No plain film findings for an acute abdominal  process. Possible mild constipation.   Electronically Signed   By: Marijo Sanes M.D.   On: 10/12/2014 14:24   Dg Abd Portable 1v  10/12/2014   CLINICAL DATA:  Endotracheal tube placement.  EXAM: PORTABLE CHEST - 1 VIEW; PORTABLE ABDOMEN - 1 VIEW  COMPARISON:  10/11/2014  FINDINGS: Chest x-ray:  The endotracheal tube is in good position at the mid tracheal level approximately 6 cm above the carina. Stable advanced emphysematous changes with bullous changes in the right upper lobe. Persistent but improved edema and atelectasis. No pleural effusion.  Abdominal radiograph:  Moderate stool throughout the colon and down into the rectum may suggest constipation. No findings for small bowel obstruction or free air. The NG tube has been advanced since the chest x-ray. The tip is in the body region of the stomach.  IMPRESSION: Overall improved lung aeration with resolving edema and atelectasis.  Stable underlying emphysematous changes and pulmonary scarring.  The NG tube and ET tubes are in good position.  No plain film findings for an acute abdominal process. Possible mild constipation.   Electronically Signed   By: Marijo Sanes M.D.   On: 10/12/2014 14:24        ASSESSMENT / PLAN:  PULMONARY ETT 7/08 >> 7/11 A: Acute hypoxic/hypercapnic respiratory failure  2nd to acute pulmonary edema. Hx of COPD. Tobacco abuse. P:   Extubated 7/11 Continue scheduled BDs F/u CXR Bronchial hygiene  CARDIOVASCULAR A: Acute on chronic combined heart failure with HTN emergency. P:   Add nicardipine gtt for afterload reduction Lopressor prn  Nitro gtt for BP control. Continue lasix 40mg  IV Q 8 hr Started clonidine patch 0.2 mg patch  Per Cards Begin ACEI , beta blocker when hemodynamically stable  RENAL A: AKI >> not sure what baseline renal fx is. P:   Monitor renal fx  GASTROINTESTINAL A: Nutrition. P:   Advance diet after extubation Protonix for SUP  HEMATOLOGIC A: Leukocytosis >> improved. P:   F/u CBC SQ heparin for DVT prevention  INFECTIOUS A: No evidence for infection. P: Trend fever curve  ENDOCRINE A: No active issues. P:   Monitor blood glucose on chem in am  NEUROLOGIC A: Hx of back pain. Hx of ETOH. P:   PRN fentanyl Monitor for withdrawal  10/14/2014 8:57 AM  Reviewed above, examined pt.  He is alert, denies chest/abd pain.  Did well with SBT.  BP still elevated, but better than admit.  Heart rate regular, b/l faint crackles, abd soft, minimal ankle edema.  Labs and CXR from 7/11 reviewed >> creatinine improving, CXR with Rt > Lt pleural effusion.  Will proceed with extubation, continue diuresis, and adjust BP meds as needed.  CC time by me independent of APP time is 35 minutes.  Chesley Mires, MD [facility] Pulmonary/Critical Care 10/14/2014, 11:31 AM Pager:  [phone] After 3pm call: [phone]

## 2014-10-14 NOTE — Progress Notes (Signed)
Subjective:  His propofol was turned off and he is able to open eyes and nod his head to questions.  Blood pressure is quite high since he is not sedated.  Not currently in any respiratory distress  Objective:  Vital Signs in the last 24 hours: BP 185/107 mmHg  Pulse 97  Temp(Src) 97.8 F (36.6 C) (Axillary)  Resp 17  Ht 5\' 7"  (1.702 m)  Wt 64.6 kg (142 lb 6.7 oz)  BMI 22.30 kg/m2  SpO2 99%  Physical Exam: Middle-aged male intubated no acute distress Lungs: Reduced breath sounds  Cardiac:  Regular rhythm, normal S1 and S2, no S3 Abdomen:  Soft, nontender, no masses Extremities:  No edema present  Intake/Output from previous day: 07/10 0701 - 07/11 0700 In: 1324 [I.V.:1714; NG/GT:30] Out: 5225 [Urine:4175; Emesis/NG output:1050]  Weight Filed Weights   10/12/14 0252 10/13/14 0401 10/14/14 0400  Weight: 67.5 kg (148 lb 13 oz) 67.7 kg (149 lb 4 oz) 64.6 kg (142 lb 6.7 oz)    Lab Results: Basic Metabolic Panel:  Recent Labs  10/13/14 1736 10/14/14 0405  NA 134* 133*  K 3.9 4.0  CL 96* 94*  CO2 28 30  GLUCOSE 86 92  BUN 21* 19  CREATININE 1.61* 1.59*   CBC:  Recent Labs  10/11/14 1959 10/12/14 0427 10/14/14 0405  WBC 10.2 13.4* 10.2  NEUTROABS 7.5  --  7.0  HGB 15.7 14.1 13.3  HCT 47.4 42.6 39.6  MCV 109.7* 110.4* 109.1*  PLT 391 330 289   Cardiac Enzymes: Cardiac Panel (last 3 results)  Recent Labs  10/12/14 0106 10/12/14 0710 10/12/14 1337  TROPONINI 0.35* 0.42* 0.34*    Telemetry: sinus tachycardia.  Assessment/Plan:  1.  Acute systolic heart failure with cardiomyopathy 2.  Severe hypertensive heart disease elevated today  3.  Macrocytosis 4.  Renal failure unclear whether acute or chronic appears stable 5.  Increasing right pleural effusion  Recommendations:  Add clonidine patch to help with blood pressure control while he is being weaned.  I would imagine that he is going to have fairly severe hypertension based on what his drug  regimen prior to coming in was.  Eventually he will need to be on beta blockers and ACE inhibitor once we know his renal function is stable and he is extubated.      Kerry Hough  MD [facility]  10/14/2014, 8:42 AM

## 2014-10-15 ENCOUNTER — Inpatient Hospital Stay (HOSPITAL_COMMUNITY): Payer: PRIVATE HEALTH INSURANCE

## 2014-10-15 LAB — BASIC METABOLIC PANEL
Anion gap: 7 (ref 5–15)
BUN: 18 mg/dL (ref 6–20)
CO2: 31 mmol/L (ref 22–32)
Calcium: 7.8 mg/dL — ABNORMAL LOW (ref 8.9–10.3)
Chloride: 94 mmol/L — ABNORMAL LOW (ref 101–111)
Creatinine, Ser: 1.32 mg/dL — ABNORMAL HIGH (ref 0.61–1.24)
GFR calc non Af Amer: 56 mL/min — ABNORMAL LOW (ref >60)
Glucose, Bld: 141 mg/dL — ABNORMAL HIGH (ref 65–99)
Potassium: 3.6 mmol/L (ref 3.5–5.1)
Sodium: 132 mmol/L — ABNORMAL LOW (ref 135–145)

## 2014-10-15 LAB — CBC
HCT: 38.2 % — ABNORMAL LOW (ref 39.0–52.0)
Hemoglobin: 12.7 g/dL — ABNORMAL LOW (ref 13.0–17.0)
MCH: 36.1 pg — AB (ref 26.0–34.0)
MCHC: 33.2 g/dL (ref 30.0–36.0)
MCV: 108.5 fL — ABNORMAL HIGH (ref 78.0–100.0)
PLATELETS: 283 10*3/uL (ref 150–400)
RBC: 3.52 MIL/uL — AB (ref 4.22–5.81)
RDW: 14.4 % (ref 11.5–15.5)
WBC: 10 10*3/uL (ref 4.0–10.5)

## 2014-10-15 LAB — MAGNESIUM: MAGNESIUM: 1.7 mg/dL (ref 1.7–2.4)

## 2014-10-15 LAB — PHOSPHORUS: Phosphorus: 3.5 mg/dL (ref 2.5–4.6)

## 2014-10-15 MED ORDER — IPRATROPIUM-ALBUTEROL 0.5-2.5 (3) MG/3ML IN SOLN: 3.0000 mL | RESPIRATORY_TRACT | Status: DC | PRN

## 2014-10-15 MED ORDER — POTASSIUM CHLORIDE CRYS ER 20 MEQ PO TBCR
40.0000 meq | EXTENDED_RELEASE_TABLET | Freq: Once | ORAL | Status: AC
  Administered 2014-10-15: 40 meq via ORAL
  Filled 2014-10-15: qty 2

## 2014-10-15 MED ORDER — DUTASTERIDE 0.5 MG PO CAPS
0.5000 mg | ORAL_CAPSULE | Freq: Every day | ORAL | Status: DC
Start: 2014-10-15 — End: 2014-10-23
  Administered 2014-10-15 – 2014-10-22 (×8): 0.5 mg via ORAL
  Filled 2014-10-15 (×9): qty 1

## 2014-10-15 MED ORDER — SPIRONOLACTONE 50 MG PO TABS
75.0000 mg | ORAL_TABLET | Freq: Every day | ORAL | Status: DC
  Administered 2014-10-15 – 2014-10-22 (×8): 75 mg via ORAL
  Filled 2014-10-15 (×6): qty 1
  Filled 2014-10-15 (×2): qty 3
  Filled 2014-10-15: qty 1

## 2014-10-15 MED ORDER — MIRTAZAPINE 45 MG PO TABS
45.0000 mg | ORAL_TABLET | Freq: Every day | ORAL | Status: DC
  Administered 2014-10-15 – 2014-10-22 (×9): 45 mg via ORAL
  Filled 2014-10-15: qty 3
  Filled 2014-10-15 (×6): qty 1
  Filled 2014-10-15: qty 3
  Filled 2014-10-15 (×2): qty 1

## 2014-10-15 MED ORDER — ALPRAZOLAM 0.25 MG PO TABS
0.2500 mg | ORAL_TABLET | Freq: Three times a day (TID) | ORAL | Status: DC | PRN
  Administered 2014-10-16 – 2014-10-20 (×9): 0.25 mg via ORAL
  Filled 2014-10-15 (×10): qty 1

## 2014-10-15 MED ORDER — CARVEDILOL 3.125 MG PO TABS
3.1250 mg | ORAL_TABLET | Freq: Two times a day (BID) | ORAL | Status: DC
Start: 2014-10-15 — End: 2014-10-17
  Administered 2014-10-15 – 2014-10-16 (×4): 3.125 mg via ORAL
  Filled 2014-10-15 (×7): qty 1

## 2014-10-15 MED ORDER — SACUBITRIL-VALSARTAN 24-26 MG PO TABS
1.0000 | ORAL_TABLET | Freq: Two times a day (BID) | ORAL | Status: DC
  Administered 2014-10-15 – 2014-10-22 (×15): 1 via ORAL
  Filled 2014-10-15 (×18): qty 1

## 2014-10-15 MED ORDER — SPIRONOLACTONE 25 MG PO TABS: 150.0000 mg | ORAL_TABLET | Freq: Every day | ORAL | Status: DC

## 2014-10-15 MED ORDER — HYDRALAZINE HCL 20 MG/ML IJ SOLN
10.0000 mg | INTRAMUSCULAR | Status: DC | PRN
  Administered 2014-10-15 – 2014-10-16 (×3): 10 mg via INTRAVENOUS
  Filled 2014-10-15 (×4): qty 1

## 2014-10-15 NOTE — Progress Notes (Signed)
Subjective:  Patient was extubated yesterday and is currently sitting up in the bed eating his breakfast.  No current complaints of shortness of breath.  Identified that his primary doctor is Jakell Trusty.  I spoke to their office and they could not find any cardiology notes or records.  The patient says he saw a cardiologist a year ago and describes possibly having a catheterization done through the wrist but we can't find any records of this.  He is a somewhat reluctant historian.  Says he drinks only one to 2 beers per day.  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  No chest pain today and no shortness of breath.  Objective:  Vital Signs in the last 24 hours: BP 173/85 mmHg  Pulse 109  Temp(Src) 96.3 F (35.7 C) (Oral)  Resp 25  Ht 5\' 7"  (1.702 m)  Wt 65.3 kg (143 lb 15.4 oz)  BMI 22.54 kg/m2  SpO2 91%  Physical Exam: Middle-aged male sitting up in bed eating in no acute distress Lungs: Reduced breath sounds  Cardiac:  Regular rhythm, normal S1 and S2, no S3 Abdomen:  Soft, nontender, no masses Extremities:  No edema present  Intake/Output from previous day: 07/11 0701 - 07/12 0700 In: 3144.8 [P.O.:480; I.V.:2624.8; NG/GT:30] Out: 7220 [Urine:7220]  Weight Filed Weights   10/13/14 0401 10/14/14 0400 10/15/14 0400  Weight: 67.7 kg (149 lb 4 oz) 64.6 kg (142 lb 6.7 oz) 65.3 kg (143 lb 15.4 oz)    Lab Results: Basic Metabolic Panel:  Recent Labs  10/14/14 0405 10/15/14 0335  NA 133* 132*  K 4.0 3.6  CL 94* 94*  CO2 30 31  GLUCOSE 92 141*  BUN 19 18  CREATININE 1.59* 1.32*   CBC:  Recent Labs  10/14/14 0405 10/15/14 0335  WBC 10.2 10.0  NEUTROABS 7.0  --   HGB 13.3 12.7*  HCT 39.6 38.2*  MCV 109.1* 108.5*  PLT 289 283   Cardiac Enzymes: Cardiac Panel (last 3 results)  Recent Labs  10/12/14 1337  TROPONINI 0.34*    Telemetry: sinus tachycardia.  Assessment/Plan:  1.  Acute systolic heart failure with  cardiomyopathy clinically improved 2.  Severe hypertensive heart disease elevated today  3.  Macrocytosis 4.  Renal failure unclear whether acute or chronic appears to be improving  5.  Increasing right pleural effusion  Recommendations:  Initiate beta blocker therapy.  Because of his severe hypertension I'm going to go ahead and  initiate Entresto.  Taper nitroglycerin and nicardipine drip and will titrate medicines up for blood pressure.  When he moves out of the unit I would go ahead and transfer him to [facility] as he is going to need cardiac catheterization prior to discharge.  Good diuresis since he has been out.   Kerry Hough  MD [facility]  10/15/2014, 8:45 AM

## 2014-10-15 NOTE — Evaluation (Addendum)
Occupational Therapy Evaluation Patient Details Name: Travis Romero MRN: [id number] DOB: 1950-09-24 Today's Date: 10/15/2014    History of Present Illness Pt was admitted with progressive dyspnea and dx'd with CHF exacerbation.  He was intubated 7/8 - 7/11.  Pt has a h/o HTN, COPD, and back pain   Clinical Impression   This 64 year old man was admitted for the above.  He will benefit from skilled OT to increase safety and independence with adls.  Pt currently needs total A x 1-2 for ADLs and bed mobility.  Goals in acute are for  Min to mod A.  Additionally, we will focus on bil UE strengthening and LUE edema management  Follow Up Recommendations  SNF (depending upon progress)    Equipment Recommendations   (to be further assessed)    Recommendations for Other Services       Precautions / Restrictions Precautions Precautions: Fall Restrictions Weight Bearing Restrictions: No      Mobility Bed Mobility Overal bed mobility: +2 for physical assistance       Supine to sit: Total assist;+2 for physical assistance Sit to supine: Total assist;+2 for physical assistance   General bed mobility comments: assist for legs and trunk.  Pt did not assist  Transfers                 General transfer comment: unable    Balance Overall balance assessment: Needs assistance Sitting-balance support: Feet supported (L foot supported) Sitting balance-Leahy Scale: Zero                                      ADL Overall ADL's : Needs assistance/impaired Eating/Feeding:  (pt had spillage on chest and food particles on face)   Grooming: Minimal assistance;Bed level                                 General ADL Comments: Pt needs total A for UB ADLs and total A x 2 for LB adls, except as noted above.  Attempted to have pt sit on EOB, with total A x 2--pt leaning backwards and could not maintain balance.  c/o top of R foot being painful.  Sats dropped  to 79% while on 02; cued for breathing     Vision     Perception     Praxis      Pertinent Vitals/Pain Pain Assessment: Faces Faces Pain Scale: Hurts even more Pain Location: R foot, midfoot, dorsum Pain Intervention(s): Limited activity within patient's tolerance;Monitored during session;Repositioned     Hand Dominance     Extremity/Trunk Assessment Upper Extremity Assessment Upper Extremity Assessment: Generalized weakness;LUE deficits/detail LUE Deficits / Details: hand very edemenous:  educated on fisting and opening and positioned for edema control           Communication Communication Communication: HOH   Cognition Arousal/Alertness: Awake/alert Behavior During Therapy: WFL for tasks assessed/performed Overall Cognitive Status: No family/caregiver present to determine baseline cognitive functioning (likely different from baseline)                 General Comments: Pt had decreased initiation and  asked what we were doing twice, when trying to get to EOB--decreased processing and attention.   General Comments       Exercises       Shoulder Instructions  Home Living Family/patient expects to be discharged to:: Unsure                                        Prior Functioning/Environment Level of Independence: Independent        Comments: was working    OT Diagnosis: Generalized weakness;Acute pain   OT Problem List: Decreased strength;Decreased activity tolerance;Impaired balance (sitting and/or standing);Decreased cognition;Cardiopulmonary status limiting activity;Pain;Increased edema   OT Treatment/Interventions: Self-care/ADL training;DME and/or AE instruction;Balance training;Patient/family education;Therapeutic activities;Cognitive remediation/compensation    OT Goals(Current goals can be found in the care plan section) Acute Rehab OT Goals Patient Stated Goal: none stated; agreeable to OT OT Goal Formulation: With  patient (in general terms) Time For Goal Achievement: 10/29/14 Potential to Achieve Goals: Good ADL Goals Pt Will Transfer to Toilet: bedside commode;stand pivot transfer;with mod assist Pt Will Perform Toileting - Clothing Manipulation and hygiene: with min assist;sit to/from stand;sitting/lateral leans Additional ADL Goal #1: pt will complete UB adls from supported sitting with setup Additional ADL Goal #2: pt will perform LB adls with mod A, sit to stand with AE as needed Additional ADL Goal #3: pt will perform strengthening AROM program with bil UEs with supervision  Additional ADL Goal #4: pt will be independent with AROM to R hand and directing caregiver in positioning for edema management  Additional ADL Goal #5: pt will consistently initiate/follow 1 step commands within 10 seconds  OT Frequency: Min 2X/week   Barriers to D/C:            Co-evaluation              End of Session Nurse Communication:  (RN assisted)  Activity Tolerance: Patient limited by fatigue Patient left: in bed;with call bell/phone within reach;with bed alarm set   Time: 1347-1406 OT Time Calculation (min): 19 min Charges:  OT General Charges $OT Visit: 1 Procedure OT Evaluation $Initial OT Evaluation Tier I: 1 Procedure G-Codes:    Mosiah Bastin 2014/10/19, 2:47 PM  Lesle Chris, OTR/L [phone] October 19, 2014

## 2014-10-15 NOTE — Progress Notes (Signed)
Initial Nutrition Assessment  DOCUMENTATION CODES:  Not applicable  INTERVENTION: - Continue Heart Healthy diet - RD will continue to monitor for needs  NUTRITION DIAGNOSIS:  Inadequate oral intake related to inability to eat as evidenced by NPO status.  GOAL:  Patient will meet greater than or equal to 90% of their needs  MONITOR:  PO intake, Weight trends, Labs, I & O's  ASSESSMENT: Presented to the ED with progressive dyspnea of one week's duration. He has been taking combivent for COPD without improvement in her symptoms. He has been prescribed a muscle relaxant about a week ago for neck pain (flexeril and tramadol), and wondered if he was having a reaction to the same. He had not been having chest pain. He presented to the ED with progressive breathlessness and was placed on BiPAP. He became more dyspnic and was ultimately intubated.   7/12 - Pt extubated yesterday AM; needs re-estimated based on this - Heart Healthy diet ordered last night and pt consumed 100% of breakfast without issue - Likely to meet needs - Medications reviewed and labs reviewed; Na: 132 mmol/L, Cl: 94 mmol/L, creatinine elevated, Ca: 7.8 mg/dL, GFR: 56.  7/11: - Intubated with mVe: 13.4 L/min and no Propofol  Diet Order:  Diet Heart Room service appropriate?: Yes; Fluid consistency:: Thin  Skin:  Reviewed, no issues  Last BM:  PTA  Height:  Ht Readings from Last 1 Encounters:  10/12/14 5\' 7"  (1.702 m)    Weight:  Wt Readings from Last 1 Encounters:  10/15/14 143 lb 15.4 oz (65.3 kg)    Ideal Body Weight:  67.3 kg (kg)  Wt Readings from Last 10 Encounters:  10/15/14 143 lb 15.4 oz (65.3 kg)  10/07/14 151 lb (68.493 kg)    BMI:  Body mass index is 22.54 kg/(m^2).  Estimated Nutritional Needs:  Kcal:  8366-2947  Protein:  65-80 grams  Fluid:  2 L/day  EDUCATION NEEDS:  No education needs identified at this time     Jarome Matin, RD, LDN Inpatient Clinical  Dietitian Pager # [phone] After hours/weekend pager # [phone]

## 2014-10-15 NOTE — Progress Notes (Addendum)
PULMONARY / CRITICAL CARE MEDICINE HISTORY AND PHYSICAL EXAMINATION   Name: Travis Romero MRN: [id number] DOB: 07/25/1950    ADMISSION DATE:  10/11/2014  PRIMARY SERVICE: PCCM  CHIEF COMPLAINT:  Short of breath  BRIEF PATIENT DESCRIPTION:  64 yo male smoker with hx of refractory HTN presented with progressive dyspnea from pulmonary edema and combined acute on chronic heart failure.  He also has hx of COPD, chronic pain and alcohol abuse.  SIGNIFICANT EVENTS:  7/08 Admit 7/10 Cardiology consulted 7/11 Extubated  STUDIES: 7/09 Echo >> severe LVH, EF 20 to 25%, grade 1 diastolic dysfx, PAS 35 mmHg  SUBJECTIVE:  Tolerating SBT.  VITAL SIGNS: Temp:  [96.3 F (35.7 C)-98.8 F (37.1 C)] 96.3 F (35.7 C) (07/12 0400) Pulse Rate:  [97-128] 110 (07/12 0530) Resp:  [17-35] 25 (07/12 0530) BP: (111-190)/(58-112) 154/71 mmHg (07/12 0530) SpO2:  [88 %-99 %] 96 % (07/12 0530) FiO2 (%):  [5 %-40 %] 5 % (07/11 1700) Weight:  [65.3 kg (143 lb 15.4 oz)] 65.3 kg (143 lb 15.4 oz) (07/12 0400) VENTILATOR SETTINGS: Vent Mode:  [-] PRVC FiO2 (%):  [5 %-40 %] 5 % Set Rate:  [28 bmp] 28 bmp Vt Set:  [460 mL] 460 mL PEEP:  [5 cmH20] 5 cmH20 Pressure Support:  [5 cmH20] 5 cmH20 Plateau Pressure:  [13 cmH20] 13 cmH20 INTAKE / OUTPUT: Intake/Output      07/11 0701 - 07/12 0700 07/12 0701 - 07/13 0700   P.O. 480    I.V. (mL/kg) 2624.8 (40.2)    Other 10    NG/GT 30    Total Intake(mL/kg) 3144.8 (48.2)    Urine (mL/kg/hr) 7220 (4.6)    Emesis/NG output     Total Output 7220     Net -4075.2            PHYSICAL EXAMINATION: General:  Thin elderly man lying in bed Neuro: Alert and oriented, follows commands, generalized weakness. CNII-XII grossly intact HEENT: 84mm PERRL brisk bilaterally Neck: no appreciable JVD Cardiovascular:  RRR, no rubs, gallops, or murmurs. No appreciable LE edema. Lungs:  CTA anteriorly, unable to auscultate posteriorly d/t pt weakness.  Abdomen:  Soft,  non-distended, nontender, no hepato or spleenomegaly Musculoskeletal:  No deformities Skin:  Bilat. LE brawniness   LABS:  PULMONARY  Recent Labs Lab 10/11/14 2002 10/12/14 0028 10/12/14 0253 10/12/14 0442  PHART 7.262* 7.322* 7.342* 7.357  PCO2ART 58.5* 52.2* 45.1* 42.0  PO2ART 74.9* 64.3* 203* 84.1  HCO3 25.6* 26.3* 24.0 23.1  TCO2 23.0 23.8 21.5 20.6  O2SAT 90.6 89.5 99.2 95.7    CBC  Recent Labs Lab 10/12/14 0427 10/14/14 0405 10/15/14 0335  HGB 14.1 13.3 12.7*  HCT 42.6 39.6 38.2*  WBC 13.4* 10.2 10.0  PLT 330 289 283    COAGULATION  Recent Labs Lab 10/11/14 1959 10/13/14 1736  INR 0.91 0.88    CARDIAC    Recent Labs Lab 10/11/14 1959 10/12/14 0106 10/12/14 0710 10/12/14 1337  TROPONINI 0.44* 0.35* 0.42* 0.34*   No results for input(s): PROBNP in the last 168 hours.   CHEMISTRY  Recent Labs Lab 10/12/14 0351 10/12/14 1337 10/13/14 0359 10/13/14 1736 10/14/14 0405 10/15/14 0335  NA 133* 133* 132* 134* 133* 132*  K 5.1 5.2* 4.5 3.9 4.0 3.6  CL 98* 97* 97* 96* 94* 94*  CO2 25 27 27 28 30 31   GLUCOSE 110* 114* 110* 86 92 141*  BUN 23* 24* 23* 21* 19 18  CREATININE 1.30* 1.61* 1.72* 1.61* 1.59*  1.32*  CALCIUM 8.9 8.8* 8.4* 8.5* 8.4* 7.8*  MG 2.1  --  1.9  --  1.9 1.7  PHOS 4.7*  --  4.5  --  3.9 3.5   Estimated Creatinine Clearance: 52.9 mL/min (by C-G formula based on Cr of 1.32).   LIVER  Recent Labs Lab 10/11/14 1959 10/13/14 1736  AST 29 13*  ALT 26 15*  ALKPHOS 90 60  BILITOT 0.6 0.4  PROT 7.1 5.2*  ALBUMIN 3.2* 2.4*  INR 0.91 0.88     INFECTIOUS  Recent Labs Lab 10/12/14 1300  LATICACIDVEN 1.6    Imaging Dg Chest 1 View  10/13/2014   CLINICAL DATA:  Repositioning of endotracheal tube.  EXAM: CHEST  1 VIEW  COMPARISON:  Earlier film, same date.  FINDINGS: The endotracheal tube is an good position, is 6 cm above the carina. The heart and lungs are stable. Stable advanced emphysematous changes.  IMPRESSION:  Repositioning of the endotracheal to which is now at the mid tracheal level, 6 cm above the carina.   Electronically Signed   By: Marijo Sanes M.D.   On: 10/13/2014 13:54   Dg Chest Port 1 View  10/14/2014   CLINICAL DATA:  COPD, check endotracheal tube placement  EXAM: PORTABLE CHEST - 1 VIEW  COMPARISON:  10/13/2014  FINDINGS: Cardiac shadow is stable. Endotracheal tube is noted 6 cm above the carina in stable position. A nasogastric catheter extends into the stomach. Enlarging right-sided pleural effusion is noted. Emphysematous changes are again seen in the right upper lobe. Old rib fractures on the right are again seen. No focal confluent infiltrate is noted.  IMPRESSION: Increasing right-sided pleural effusion.  Tubes and lines as described.  Stable COPD   Electronically Signed   By: Inez Catalina M.D.   On: 10/14/2014 07:17   Dg Chest Port 1 View  10/13/2014   CLINICAL DATA:  Respiratory distress.  EXAM: PORTABLE CHEST - 1 VIEW  COMPARISON:  10/12/2014  FINDINGS: The endotracheal tube is at the carina and possibly just into the right mainstem bronchus. It should be retracted 4-5 cm. The NG tube is stable. The cardiac silhouette, mediastinal and hilar contours are stable. Stable emphysematous changes and lower lobe compressive atelectasis but no infiltrates or effusions.  IMPRESSION: The endotracheal tube tip is near the entrance of the right mainstem bronchus. It should be retracted approximately 4 cm.  No significant/acute pulmonary findings. Stable emphysematous changes.  These results will be called to the ordering clinician or representative by the Radiologist Assistant, and communication documented in the PACS or zVision Dashboard.   Electronically Signed   By: Marijo Sanes M.D.   On: 10/13/2014 12:10        ASSESSMENT / PLAN:  PULMONARY ETT 7/08 >> 7/11 A: Acute hypoxic/hypercapnic respiratory failure 2nd to acute pulmonary edema. Hx of COPD. Tobacco abuse. Pleural effusion P:    Extubated 7/11 Continue scheduled BDs Continue aggressive bronchial hygiene with IS and flutter valve CXR for pleural effusion monitoring  CARDIOVASCULAR A: Acute on chronic combined heart failure with HTN emergency. P:   Add nicardipine gtt for afterload reduction Lopressor prn  Wean Nitro gtt and nicardipine gtt for SBP goal <170 Add PRN hydralazine  Deescalate diuresis for goal net negative of -2L based off radiograph and physical exam Started clonidine patch 0.2 mg patch  Per Cards Begin ACEI , beta blocker when hemodynamically stable  RENAL A: AKI >> not sure what baseline renal fx is-improving -U/o last 7/11-7/12 (  24hr) 7.2L P:   Monitor renal fx D/c foley Restart home avodart, spironolactone  GASTROINTESTINAL A: Nutrition. P:   Heart healthy, low sodium diet  D/C protonix  HEMATOLOGIC A: Leukocytosis >> improved. P:   F/u CBC SQ heparin for DVT prevention  INFECTIOUS A: No evidence for infection. P: Trend fever curve  ENDOCRINE A: No active issues. P:   Monitor blood glucose on chem in am  NEUROLOGIC A: Hx of back pain. Hx of ETOH. P:   Continue home xanax  Monitor for withdrawal Physical therapy consult and occupational therapy  10/15/2014 7:13 AM  Reviewed above, examined.  Tolerating respiratory status after extubation.  Tolerating diet.  Denies chest pain.  Confused about having cardiac evaluation before (he in fact did not).  Weaning off NTG and cardene gtt.  Alert, flat affect, heart rate regular, decreased BS Rt > Lt at bases, no wheeze, abd soft.  Labs from 7/12 reviewed >> renal fx better, mild anemia.  Cardiology adjust heart failure/BP medicines.  Will check with bedside u/s and then determine if he needs thoracentesis.  Cardiology planning for cardiac cath later this week >> will need to transfer to [facility] for this.  D/w Dr. Wynonia Lawman.  Chesley Mires, MD [facility] Pulmonary/Critical Care 10/15/2014, 9:31 AM Pager:   [phone] After 3pm call: [phone]

## 2014-10-16 ENCOUNTER — Inpatient Hospital Stay (HOSPITAL_COMMUNITY): Payer: PRIVATE HEALTH INSURANCE

## 2014-10-16 DIAGNOSIS — J90 Pleural effusion, not elsewhere classified: Secondary | ICD-10-CM

## 2014-10-16 LAB — BASIC METABOLIC PANEL
Anion gap: 9 (ref 5–15)
BUN: 17 mg/dL (ref 6–20)
CHLORIDE: 96 mmol/L — AB (ref 101–111)
CO2: 28 mmol/L (ref 22–32)
Calcium: 8.5 mg/dL — ABNORMAL LOW (ref 8.9–10.3)
Creatinine, Ser: 0.95 mg/dL (ref 0.61–1.24)
GFR calc Af Amer: >60 mL/min (ref >60)
GFR calc non Af Amer: >60 mL/min (ref >60)
Glucose, Bld: 153 mg/dL — ABNORMAL HIGH (ref 65–99)
Potassium: 4 mmol/L (ref 3.5–5.1)
SODIUM: 133 mmol/L — AB (ref 135–145)

## 2014-10-16 LAB — PROTEIN, BODY FLUID: Total protein, fluid: <3 g/dL

## 2014-10-16 LAB — PH, BODY FLUID: pH, Fluid: 8

## 2014-10-16 LAB — CHOLESTEROL, TOTAL: Cholesterol: 219 mg/dL — ABNORMAL HIGH (ref 0–200)

## 2014-10-16 LAB — BODY FLUID CELL COUNT WITH DIFFERENTIAL
Eos, Fluid: 0 %
Lymphs, Fluid: 40 %
MONOCYTE-MACROPHAGE-SEROUS FLUID: 45 % — AB (ref 50–90)
Neutrophil Count, Fluid: 15 % (ref 0–25)
WBC FLUID: 418 uL (ref 0–1000)

## 2014-10-16 LAB — LACTATE DEHYDROGENASE, PLEURAL OR PERITONEAL FLUID: LD, Fluid: 104 U/L — ABNORMAL HIGH (ref 3–23)

## 2014-10-16 LAB — PROTEIN, TOTAL: TOTAL PROTEIN: 5.8 g/dL — AB (ref 6.5–8.1)

## 2014-10-16 LAB — LACTATE DEHYDROGENASE: LDH: 204 U/L — ABNORMAL HIGH (ref 98–192)

## 2014-10-16 MED ORDER — HYDROCODONE-ACETAMINOPHEN 7.5-325 MG PO TABS
1.0000 | ORAL_TABLET | Freq: Four times a day (QID) | ORAL | Status: DC | PRN
  Administered 2014-10-16 – 2014-10-19 (×7): 1 via ORAL
  Filled 2014-10-16 (×7): qty 1

## 2014-10-16 MED ORDER — MAGNESIUM SULFATE 2 GM/50ML IV SOLN
2.0000 g | Freq: Once | INTRAVENOUS | Status: AC
  Administered 2014-10-16: 2 g via INTRAVENOUS
  Filled 2014-10-16: qty 50

## 2014-10-16 MED ORDER — FUROSEMIDE 40 MG PO TABS
40.0000 mg | ORAL_TABLET | Freq: Every day | ORAL | Status: DC
  Filled 2014-10-16: qty 1

## 2014-10-16 MED ORDER — HYDRALAZINE HCL 25 MG PO TABS
25.0000 mg | ORAL_TABLET | Freq: Three times a day (TID) | ORAL | Status: DC
  Administered 2014-10-16 – 2014-10-23 (×21): 25 mg via ORAL
  Filled 2014-10-16 (×25): qty 1

## 2014-10-16 NOTE — Procedures (Signed)
Thoracentesis Procedure Note  Pre-operative Diagnosis: pleural effusion   Post-operative Diagnosis: same  Indications: dyspnea and fluid analysis   Procedure Details  Consent: Informed consent was obtained. Risks of the procedure were discussed including: infection, bleeding, pain, pneumothorax.  Under sterile conditions the patient was positioned. Betadine solution and sterile drapes were utilized.  1% buffered lidocaine was used to anesthetize the pleural  Space which was identified via real time [place]. Fluid was obtained without any difficulties and minimal blood loss.  A dressing was applied to the wound and wound care instructions were provided.   Findings 850 ml of clear pleural fluid was obtained. A sample was sent to Pathology for cytogenetics, flow, and cell counts, as well as for infection analysis.  Complications:  None; patient tolerated the procedure well.          Condition: stable  Plan A follow up chest x-ray was ordered. Bed Rest for 0 hours. Tylenol 650 mg. for pain.  Chesley Mires, MD [facility] Pulmonary/Critical Care 10/16/2014, 11:53 AM Pager:  [phone] After 3pm call: [phone]

## 2014-10-16 NOTE — Progress Notes (Signed)
[facility] Progress Note Patient Name: Travis Romero DOB: 10/23/1950 MRN: [id number]   Date of Service  10/16/2014  HPI/Events of Note  Chronic pain  eICU Interventions  Started norco     Intervention Category Intermediate Interventions: Pain - evaluation and management  Laverle Hobby 10/16/2014, 3:37 PM

## 2014-10-16 NOTE — Progress Notes (Addendum)
PULMONARY / CRITICAL CARE MEDICINE HISTORY AND PHYSICAL EXAMINATION   Name: Travis Romero MRN: [id number] DOB: 11-24-1950    ADMISSION DATE:  10/11/2014  PRIMARY SERVICE: PCCM  CHIEF COMPLAINT:  Short of breath  BRIEF PATIENT DESCRIPTION:  64 yo male smoker with hx of refractory HTN presented with progressive dyspnea from pulmonary edema and combined acute on chronic heart failure.  He also has hx of COPD, chronic pain and alcohol abuse.  SIGNIFICANT EVENTS:  7/08 Admit 7/10 Cardiology consulted 7/11 Extubated  STUDIES: 7/09 Echo >> severe LVH, EF 20 to 29%, grade 1 diastolic dysfx, PAS 35 mmHg  SUBJECTIVE: sob not increased over baseline   VITAL SIGNS: Temp:  [98.3 F (36.8 C)-99.1 F (37.3 C)] 98.3 F (36.8 C) (07/13 0400) Pulse Rate:  [98-118] 101 (07/13 0600) Resp:  [19-31] 20 (07/13 0600) BP: (113-174)/(61-97) 159/89 mmHg (07/13 0655) SpO2:  [90 %-98 %] 98 % (07/13 0600) Weight:  [59.8 kg (131 lb 13.4 oz)] 59.8 kg (131 lb 13.4 oz) (07/13 0500) VENTILATOR SETTINGS:   INTAKE / OUTPUT: Intake/Output      07/12 0701 - 07/13 0700 07/13 0701 - 07/14 0700   P.O. 420    I.V. (mL/kg) 1361.7 (22.8)    Other     NG/GT     Total Intake(mL/kg) 1781.7 (29.8)    Urine (mL/kg/hr) 5600 (3.9)    Total Output 5600     Net -3818.4            PHYSICAL EXAMINATION: General:  Thin elderly man lying in bed eating breakfast Neuro: Alert and oriented, follows commands, generalized weakness. CNII-XII grossly intact HEENT: 12mm PERRL brisk bilaterally Neck: no appreciable JVD Cardiovascular:  RRR, no rubs, gallops, or murmurs. No appreciable LE edema. Lungs:  CTA anteriorly Abdomen:  Soft, non-distended, nontender Musculoskeletal:  No deformities Skin:  Bilat. LE brawniness   LABS:  PULMONARY  Recent Labs Lab 10/11/14 2002 10/12/14 0028 10/12/14 0253 10/12/14 0442  PHART 7.262* 7.322* 7.342* 7.357  PCO2ART 58.5* 52.2* 45.1* 42.0  PO2ART 74.9* 64.3* 203* 84.1  HCO3  25.6* 26.3* 24.0 23.1  TCO2 23.0 23.8 21.5 20.6  O2SAT 90.6 89.5 99.2 95.7    CBC  Recent Labs Lab 10/12/14 0427 10/14/14 0405 10/15/14 0335  HGB 14.1 13.3 12.7*  HCT 42.6 39.6 38.2*  WBC 13.4* 10.2 10.0  PLT 330 289 283    COAGULATION  Recent Labs Lab 10/11/14 1959 10/13/14 1736  INR 0.91 0.88    CARDIAC    Recent Labs Lab 10/11/14 1959 10/12/14 0106 10/12/14 0710 10/12/14 1337  TROPONINI 0.44* 0.35* 0.42* 0.34*   No results for input(s): PROBNP in the last 168 hours.   CHEMISTRY  Recent Labs Lab 10/12/14 0351  10/13/14 0359 10/13/14 1736 10/14/14 0405 10/15/14 0335 10/16/14 0342  NA 133*  < > 132* 134* 133* 132* 133*  K 5.1  < > 4.5 3.9 4.0 3.6 4.0  CL 98*  < > 97* 96* 94* 94* 96*  CO2 25  < > 27 28 30 31 28   GLUCOSE 110*  < > 110* 86 92 141* 153*  BUN 23*  < > 23* 21* 19 18 17   CREATININE 1.30*  < > 1.72* 1.61* 1.59* 1.32* 0.95  CALCIUM 8.9  < > 8.4* 8.5* 8.4* 7.8* 8.5*  MG 2.1  --  1.9  --  1.9 1.7  --   PHOS 4.7*  --  4.5  --  3.9 3.5  --   < > =  values in this interval not displayed. Estimated Creatinine Clearance: 67.3 mL/min (by C-G formula based on Cr of 0.95).   LIVER  Recent Labs Lab 10/11/14 1959 10/13/14 1736  AST 29 13*  ALT 26 15*  ALKPHOS 90 60  BILITOT 0.6 0.4  PROT 7.1 5.2*  ALBUMIN 3.2* 2.4*  INR 0.91 0.88     INFECTIOUS  Recent Labs Lab 10/12/14 1300  LATICACIDVEN 1.6    Imaging Dg Chest Port 1 View  10/15/2014   CLINICAL DATA:  Hypertension.  Renal failure.  EXAM: PORTABLE CHEST - 1 VIEW  COMPARISON:  10/14/2014.  FINDINGS: Interim extubation and removal of NG tube. Cardiomegaly with bilateral pulmonary alveolar infiltrates and small pleural effusions consistent congestive heart failure. Superimposed pneumonia lung bases cannot be excluded. Mild gastric distention.  IMPRESSION: 1. Interim extubation removal of NG tube. 2. Congestive heart failure with bilateral pulmonary edema and small pleural effusions.  Basilar pneumonia cannot be excluded.   Electronically Signed   By: Marcello Moores  Register   On: 10/15/2014 09:33        ASSESSMENT / PLAN:  PULMONARY ETT 7/08 >> 7/11 A: Acute hypoxic/hypercapnic respiratory failure 2nd to acute pulmonary edema. Hx of COPD. Tobacco abuse. Bilateral Pleural effusions P:   Extubated 7/11 Continue scheduled BDs Continue aggressive bronchial hygiene with IS and flutter valve Bedside US for bilateral pleural effusions  CARDIOVASCULAR A: Acute on chronic combined heart failure with HTN emergency>resolved and off cardene and nitro gtt since 7/12 evening P:   PO lasix Coreg started 7/12 Entresto started 7/12 Lopressor prn  Add PRN hydralazine  Goal net negative of -2L based off radiograph and physical exam Started clonidine patch 0.2 mg patch  Per Cards Begin ACEI when hemodynamically stable  RENAL A: AKI >> not sure what baseline renal fx is-improving P:   Monitor BMP  Monitor renal fx D/c foley Restart home avodart, spironolactone  GASTROINTESTINAL A: Nutrition. P:   Heart healthy, low sodium diet  D/C protonix  HEMATOLOGIC A: Leukocytosis >> improved. P:   Intermittent cbcs SQ heparin for DVT prevention  INFECTIOUS A: No evidence for infection. P: Trend fever curve  ENDOCRINE A: No active issues. P:   Monitor blood glucose on chem in am  NEUROLOGIC A: Hx of back pain. Hx of ETOH. P:   Continue home xanax  Monitor for withdrawal Physical therapy  and occupational therapy  10/16/2014 8:36 AM  Reviewed above, examined.  More alert.  Denies chest pain.  Feels SOB still, and feels weak.  Follows commands, pleasant, heart rate regular, decreased BS at bases, abd soft.  Labs from 7/13 reviewed >> renal fx better.  Continue diuresis.  BP and heart failure meds per cardiology.  Will proceed with therapeutic thoracentesis.  D/w Dr. Wynonia Lawman >> will likely need cardiac cath later this week >> will transfer to [facility] in  anticipation of this.  Will also get PT to start working with him.  Will ask [facility] to assume care from 7/14 and PCCM off.  Chesley Mires, MD [facility] Pulmonary/Critical Care 10/16/2014, 11:37 AM Pager:  [phone] After 3pm call: [phone]

## 2014-10-16 NOTE — Progress Notes (Signed)
Report received from [facility] ICU at 1742 and pt arrived to the unit at 1900 via [facility] on a stretcher; pt alert and verbally responsive; pt on 3L oxygen upon arrival to the unit; no pressure ulcer noted on sacrum; discoloration to BLE with foam dsgs noted. Pt report of pain and sensitiveness to BLE which pt was screaming during transfer. Pt VSS; telemetry applied and verified; pt oriented to the unit and room; call light within reach; reported off to oncoming RN. Francis Gaines Traver Meckes RN.

## 2014-10-16 NOTE — Progress Notes (Signed)
Occupational Therapy Treatment Patient Details Name: Travis Romero MRN: [id number] DOB: 04/05/1951 Today's Date: 10/16/2014    History of present illness Pt was admitted with progressive dyspnea and dx'd with CHF exacerbation.  He was intubated 7/8 - 7/11.  Pt has a h/o HTN, COPD, and back pain   OT comments  Started cotx with PT but pt was too limited by pain:  RN is contacting MD.  Returned for LUE exercises/edema control  Follow Up Recommendations  SNF    Equipment Recommendations   (to be further assessed)    Recommendations for Other Services      Precautions / Restrictions Precautions Precautions: Fall Restrictions Weight Bearing Restrictions: No       Mobility Bed Mobility Overal bed mobility: +2 for physical assistance;Needs Assistance Bed Mobility: Rolling Rolling: Total assist;+2 for physical assistance         General bed mobility comments: assist for legs and trunk, verbal cues for technique, 10/10 pain "everywhere" limited further mobility  Transfers                 General transfer comment: unable    Balance                                   ADL                                         General ADL Comments:   Began cotx with PT, but pt not tolerating well.  Returned for UE exercises/ edema control.  LUE edema improved.  Positioned for edema management      Vision                     Perception     Praxis      Cognition   Behavior During Therapy: WFL for tasks assessed/performed Overall Cognitive Status: Within Functional Limits for tasks assessed                  General Comments: pt much clearer today; is a LCSW at [employer]    Extremity/Trunk Assessment        Cervical / Trunk Assessment Cervical / Trunk Assessment:  (NT- pt not able to sit due to pain)    Exercises Other Exercises Other Exercises: AROM/squeezing ball LUE Other Exercises: AAROM within pain tolerance for  shoulder, supporting at elbow LUE Other Exercises: AROM within painfree tolerance wrist and elbow LUE; RUE WFLs, may benefit from theraband on next visit    Shoulder Instructions       General Comments      Pertinent Vitals/ Pain       Pain Assessment: 0-10 Pain Score: 10-Worst pain ever Pain Location: everywhere Pain Descriptors / Indicators: Grimacing;Sore Pain Intervention(s): Limited activity within patient's tolerance;Monitored during session  [place] expects to be discharged to:: Unsure (lives alone in 2nd floor apartment)                                        Prior Functioning/Environment Level of Independence: Independent        Comments: was working   Frequency Min 2X/week     Progress Toward Goals  OT Goals(current goals can now be  found in the care plan section)  Progress towards OT goals: Progressing toward goals  Acute Rehab OT Goals Patient Stated Goal: none stated; agreeable to PT  Plan Discharge plan remains appropriate    Co-evaluation      Reason for Co-Treatment: Complexity of the patient's impairments (multi-system involvement);For patient/therapist safety PT goals addressed during session: Mobility/safety with mobility;Strengthening/ROM  OT:  Mobility for adls      End of Session     Activity Tolerance No increased pain   Patient Left in bed;with call bell/phone within reach;with bed alarm set   Nurse Communication          Time: 2947-6546 and 1152 - 1202 OT Time Calculation (min): 19 min  Charges: OT General Charges $OT Visit: 1 Procedure OT Treatments $Therapeutic Exercise: 8-22 mins  Travis Romero 10/16/2014, 2:42 PM

## 2014-10-16 NOTE — Progress Notes (Signed)
Subjective:  Patient alert and oriented this morning.  Mild complaints of shortness of breath.  Weight is down significantly.  Renal function has now normalized.  No chest pain.  Further review of history reveals no records of cardiology visits at [facility] cardiology or in the [facility].  No chest pain.  Says that he drinks 2 beers per day.  Objective:  Vital Signs in the last 24 hours: BP 170/95 mmHg  Pulse 100  Temp(Src) 98.3 F (36.8 C) (Axillary)  Resp 24  Ht 5\' 7"  (1.702 m)  Wt 59.8 kg (131 lb 13.4 oz)  BMI 20.64 kg/m2  SpO2 96%  Physical Exam: Middle-aged male sitting up in bed in no acute distress Lungs: Reduced breath sounds  Cardiac:  Regular rhythm, normal S1 and S2, no S3 Abdomen:  Soft, nontender, no masses Extremities:  No edema present  Intake/Output from previous day: 07/12 0701 - 07/13 0700 In: 1781.7 [P.O.:420; I.V.:1361.7] Out: 5600 [Urine:5600]  Weight Filed Weights   10/14/14 0400 10/15/14 0400 10/16/14 0500  Weight: 64.6 kg (142 lb 6.7 oz) 65.3 kg (143 lb 15.4 oz) 59.8 kg (131 lb 13.4 oz)    Lab Results: Basic Metabolic Panel:  Recent Labs  10/15/14 0335 10/16/14 0342  NA 132* 133*  K 3.6 4.0  CL 94* 96*  CO2 31 28  GLUCOSE 141* 153*  BUN 18 17  CREATININE 1.32* 0.95   CBC:  Recent Labs  10/14/14 0405 10/15/14 0335  WBC 10.2 10.0  NEUTROABS 7.0  --   HGB 13.3 12.7*  HCT 39.6 38.2*  MCV 109.1* 108.5*  PLT 289 283    Telemetry: sinus tachycardia.  Assessment/Plan:  1.  Acute systolic heart failure with cardiomyopathy clinically improved 2.  Severe hypertensive heart disease blood pressure elevated today  3.  Macrocytosis 4.  Renal failure acute that has resolved   Recommendations:  He is clinically better today with good weight loss.  Renal function has improved. Entresto initiated yesterday.  Blood pressure is still elevated and think we can probably increase this quickly if his blood pressure remains elevated.  Renal  function has improved.  We discussed cardiomyopathy as well as etiologies today.  He needs to have a cardiac catheterization.  The significant LVH seen on the echo suggests this could be due to hypertension but need to exclude coronary artery disease.  When he is able to be transferred out of the unit I would recommend that he be moved to [facility] and be seen by the heart failure service there.  He will need to have a cardiac catheterization.  I will be away the next couple of days and he will be followed by [facility] cardiology.  I discussed cardiac catheterization with him and is agreeable .Cardiac catheterization procedure was discussed with the patient fully including risks of myocardial infarction, death, stroke, bleeding, arrhythmia, dye allergy, or renal insufficiency. The patient understands and is willing to proceed.   Kerry Hough  MD [facility] Cardiology  10/16/2014, 8:54 AM

## 2014-10-16 NOTE — Evaluation (Addendum)
Physical Therapy Evaluation Patient Details Name: Travis Romero MRN: [id number] DOB: 28-Jul-1950 Today's Date: 10/16/2014   History of Present Illness  Pt was admitted with progressive dyspnea and dx'd with CHF exacerbation.  He was intubated 7/8 - 7/11.  Pt has a h/o HTN, COPD, and back pain  Clinical Impression  Pt admitted with above diagnosis. Pt currently with functional limitations due to the deficits listed below (see PT Problem List). +2 assist to roll in bed. Pt reported 10/10 pain "everywhere" with mobility and unable to tolerate further activity. RN notified, pain medication requested. Will follow.  Pt will benefit from skilled PT to increase their independence and safety with mobility to allow discharge to the venue listed below.       Follow Up Recommendations      Equipment Recommendations       Recommendations for Other Services       Precautions / Restrictions Precautions Precautions: Fall Restrictions Weight Bearing Restrictions: No      Mobility  Bed Mobility Overal bed mobility: +2 for physical assistance;Needs Assistance Bed Mobility: Rolling Rolling: Total assist;+2 for physical assistance         General bed mobility comments: assist for legs and trunk, verbal cues for technique, 10/10 pain "everywhere" limited further mobility. Pt had difficulty with motor planning, "How do I get up?"  Transfers                 General transfer comment: unable  Ambulation/Gait                Stairs            Wheelchair Mobility    Modified Rankin (Stroke Patients Only)       Balance                                             Pertinent Vitals/Pain Pain Assessment: 0-10 Pain Score: 10-Worst pain ever Pain Location: "everywhere" Pain Descriptors / Indicators: Sore;Grimacing Pain Intervention(s): Limited activity within patient's tolerance;Monitored during session;Patient requesting pain meds-RN  notified;Repositioned    Home Living Family/patient expects to be discharged to:: Unsure (lives alone in 2nd floor apartment)                      Prior Function Level of Independence: Independent         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, newspaper        Extremity/Trunk Assessment   Upper Extremity Assessment: Defer to OT evaluation       LUE Deficits / Details: hand very edemenous:  educated on fisting and opening and positioned for edema control   Lower Extremity Assessment: Generalized weakness (pt participated with BLE AAROM in supine, grossly -3/5)      Cervical / Trunk Assessment:  (NT- pt not able to sit due to pain)  Communication   Communication: HOH  Cognition Arousal/Alertness: Awake/alert Behavior During Therapy: WFL for tasks assessed/performed Overall Cognitive Status: Within Functional Limits for tasks assessed                      General Comments      Exercises        Assessment/Plan    PT Assessment    PT Diagnosis     PT Problem List    PT Treatment Interventions  PT Goals (Current goals can be found in the Care Plan section) Acute Rehab PT Goals Patient Stated Goal: none stated; agreeable to PT PT Goal Formulation: With patient Time For Goal Achievement: 10/30/14 Potential to Achieve Goals: Fair    Frequency     Barriers to discharge        Co-evaluation PT/OT/SLP Co-Evaluation/Treatment: Yes Reason for Co-Treatment: Complexity of the patient's impairments (multi-system involvement);For patient/therapist safety PT goals addressed during session: Mobility/safety with mobility;Strengthening/ROM         End of Session                 Time: 8251-8984 PT Time Calculation (min) (ACUTE ONLY): 10 min   Charges:   PT Evaluation $Initial PT Evaluation Tier I: 1 Procedure     PT G Codes:        Philomena Doheny 10/16/2014, 12:49 PM [phone]

## 2014-10-17 ENCOUNTER — Other Ambulatory Visit: Payer: Self-pay | Admitting: *Deleted

## 2014-10-17 ENCOUNTER — Encounter (HOSPITAL_COMMUNITY)
Admission: EM | Disposition: A | Discharge status: Rehabilitation Facility | Payer: Self-pay | Source: Home / Self Care | Attending: Thoracic Surgery (Cardiothoracic Vascular Surgery)

## 2014-10-17 ENCOUNTER — Encounter (HOSPITAL_COMMUNITY): Payer: Self-pay | Admitting: General Practice

## 2014-10-17 DIAGNOSIS — I5041 Acute combined systolic (congestive) and diastolic (congestive) heart failure: Secondary | ICD-10-CM

## 2014-10-17 DIAGNOSIS — R0602 Shortness of breath: Secondary | ICD-10-CM | POA: Insufficient documentation

## 2014-10-17 DIAGNOSIS — I2510 Atherosclerotic heart disease of native coronary artery without angina pectoris: Secondary | ICD-10-CM

## 2014-10-17 DIAGNOSIS — I214 Non-ST elevation (NSTEMI) myocardial infarction: Secondary | ICD-10-CM | POA: Insufficient documentation

## 2014-10-17 DIAGNOSIS — I5043 Acute on chronic combined systolic (congestive) and diastolic (congestive) heart failure: Secondary | ICD-10-CM | POA: Insufficient documentation

## 2014-10-17 DIAGNOSIS — F4322 Adjustment disorder with anxiety: Secondary | ICD-10-CM | POA: Insufficient documentation

## 2014-10-17 HISTORY — PX: CARDIAC CATHETERIZATION: SHX172

## 2014-10-17 LAB — BASIC METABOLIC PANEL
Anion gap: 9 (ref 5–15)
BUN: 20 mg/dL (ref 6–20)
CO2: 26 mmol/L (ref 22–32)
Calcium: 8.8 mg/dL — ABNORMAL LOW (ref 8.9–10.3)
Chloride: 95 mmol/L — ABNORMAL LOW (ref 101–111)
Creatinine, Ser: 1.12 mg/dL (ref 0.61–1.24)
Glucose, Bld: 179 mg/dL — ABNORMAL HIGH (ref 65–99)
POTASSIUM: 5.1 mmol/L (ref 3.5–5.1)
Sodium: 130 mmol/L — ABNORMAL LOW (ref 135–145)

## 2014-10-17 LAB — POCT I-STAT 3, VENOUS BLOOD GAS (G3P V)
Acid-Base Excess: 1 mmol/L (ref 0.0–2.0)
BICARBONATE: 28.6 meq/L — AB (ref 20.0–24.0)
O2 Saturation: 68 %
TCO2: 30 mmol/L (ref 0–100)
pCO2, Ven: 56.5 mmHg — ABNORMAL HIGH (ref 45.0–50.0)
pH, Ven: 7.312 — ABNORMAL HIGH (ref 7.250–7.300)
pO2, Ven: 40 mmHg (ref 30.0–45.0)

## 2014-10-17 LAB — CBC WITH DIFFERENTIAL/PLATELET
BASOS ABS: 0 10*3/uL (ref 0.0–0.1)
Basophils Relative: 0 % (ref 0–1)
EOS PCT: 0 % (ref 0–5)
Eosinophils Absolute: 0 10*3/uL (ref 0.0–0.7)
HCT: 46.1 % (ref 39.0–52.0)
Hemoglobin: 15.3 g/dL (ref 13.0–17.0)
Lymphocytes Relative: 6 % — ABNORMAL LOW (ref 12–46)
Lymphs Abs: 1.4 10*3/uL (ref 0.7–4.0)
MCH: 35.6 pg — ABNORMAL HIGH (ref 26.0–34.0)
MCHC: 33.2 g/dL (ref 30.0–36.0)
MCV: 107.2 fL — ABNORMAL HIGH (ref 78.0–100.0)
MONO ABS: 2.6 10*3/uL — AB (ref 0.1–1.0)
MONOS PCT: 11 % (ref 3–12)
NEUTROS PCT: 82 % — AB (ref 43–77)
Neutro Abs: 18.9 10*3/uL — ABNORMAL HIGH (ref 1.7–7.7)
Platelets: 279 10*3/uL (ref 150–400)
RBC: 4.3 MIL/uL (ref 4.22–5.81)
RDW: 14 % (ref 11.5–15.5)
WBC: 22.9 10*3/uL — AB (ref 4.0–10.5)

## 2014-10-17 SURGERY — RIGHT/LEFT HEART CATH AND CORONARY ANGIOGRAPHY

## 2014-10-17 MED ORDER — LIDOCAINE HCL (PF) 1 % IJ SOLN
INTRAMUSCULAR | Status: DC | PRN
  Administered 2014-10-17: 15 mL

## 2014-10-17 MED ORDER — ASPIRIN 81 MG PO CHEW
81.0000 mg | CHEWABLE_TABLET | ORAL | Status: AC
  Administered 2014-10-17: 81 mg via ORAL
  Filled 2014-10-17: qty 1

## 2014-10-17 MED ORDER — ACETAMINOPHEN 325 MG PO TABS: 650.0000 mg | ORAL_TABLET | ORAL | Status: DC | PRN

## 2014-10-17 MED ORDER — SODIUM CHLORIDE 0.9 % IJ SOLN
3.0000 mL | Freq: Two times a day (BID) | INTRAMUSCULAR | Status: DC
  Administered 2014-10-17: 3 mL via INTRAVENOUS

## 2014-10-17 MED ORDER — ATORVASTATIN CALCIUM 80 MG PO TABS
80.0000 mg | ORAL_TABLET | Freq: Every day | ORAL | Status: DC
  Administered 2014-10-17 – 2014-11-03 (×17): 80 mg via ORAL
  Filled 2014-10-17 (×20): qty 1

## 2014-10-17 MED ORDER — MIDAZOLAM HCL 2 MG/2ML IJ SOLN
INTRAMUSCULAR | Status: AC
Start: 2014-10-17 — End: 2014-10-17
  Filled 2014-10-17: qty 2

## 2014-10-17 MED ORDER — SODIUM CHLORIDE 0.9 % IV SOLN
INTRAVENOUS | Status: DC
  Administered 2014-10-17: 11:00:00 via INTRAVENOUS

## 2014-10-17 MED ORDER — FENTANYL CITRATE (PF) 100 MCG/2ML IJ SOLN
INTRAMUSCULAR | Status: AC
  Filled 2014-10-17: qty 2

## 2014-10-17 MED ORDER — SODIUM CHLORIDE 0.9 % IJ SOLN: 3.0000 mL | INTRAMUSCULAR | Status: DC | PRN

## 2014-10-17 MED ORDER — CARVEDILOL 6.25 MG PO TABS
6.2500 mg | ORAL_TABLET | Freq: Two times a day (BID) | ORAL | Status: DC
  Administered 2014-10-17 – 2014-10-22 (×11): 6.25 mg via ORAL
  Filled 2014-10-17 (×15): qty 1

## 2014-10-17 MED ORDER — IOHEXOL 350 MG/ML SOLN
INTRAVENOUS | Status: DC | PRN
  Administered 2014-10-17: 100 mL via INTRAVENOUS

## 2014-10-17 MED ORDER — FUROSEMIDE 40 MG PO TABS
40.0000 mg | ORAL_TABLET | Freq: Two times a day (BID) | ORAL | Status: DC
Start: 2014-10-17 — End: 2014-10-20
  Administered 2014-10-17 – 2014-10-20 (×6): 40 mg via ORAL
  Filled 2014-10-17 (×9): qty 1

## 2014-10-17 MED ORDER — HEPARIN (PORCINE) IN NACL 2-0.9 UNIT/ML-% IJ SOLN
INTRAMUSCULAR | Status: AC
  Filled 2014-10-17: qty 1500

## 2014-10-17 MED ORDER — SODIUM CHLORIDE 0.9 % IV SOLN: 250.0000 mL | INTRAVENOUS | Status: DC | PRN

## 2014-10-17 MED ORDER — ONDANSETRON HCL 4 MG/2ML IJ SOLN: 4.0000 mg | Freq: Four times a day (QID) | INTRAMUSCULAR | Status: DC | PRN

## 2014-10-17 MED ORDER — FENTANYL CITRATE (PF) 100 MCG/2ML IJ SOLN
INTRAMUSCULAR | Status: DC | PRN
  Administered 2014-10-17 (×2): 25 ug via INTRAVENOUS

## 2014-10-17 MED ORDER — ASPIRIN 81 MG PO CHEW
81.0000 mg | CHEWABLE_TABLET | Freq: Every day | ORAL | Status: DC
  Administered 2014-10-17 – 2014-10-22 (×6): 81 mg via ORAL
  Filled 2014-10-17 (×7): qty 1

## 2014-10-17 MED ORDER — MIDAZOLAM HCL 2 MG/2ML IJ SOLN
INTRAMUSCULAR | Status: DC | PRN
  Administered 2014-10-17 (×2): 1 mg via INTRAVENOUS

## 2014-10-17 MED ORDER — SODIUM CHLORIDE 0.9 % IJ SOLN
3.0000 mL | Freq: Two times a day (BID) | INTRAMUSCULAR | Status: DC
  Administered 2014-10-17 – 2014-10-22 (×11): 3 mL via INTRAVENOUS

## 2014-10-17 MED ORDER — SODIUM CHLORIDE 0.9 % IV SOLN: INTRAVENOUS | Status: AC

## 2014-10-17 MED ORDER — HEPARIN SODIUM (PORCINE) 5000 UNIT/ML IJ SOLN
5000.0000 [IU] | Freq: Three times a day (TID) | INTRAMUSCULAR | Status: DC
  Administered 2014-10-18 – 2014-10-22 (×15): 5000 [IU] via SUBCUTANEOUS
  Filled 2014-10-17 (×18): qty 1

## 2014-10-17 MED ORDER — LIDOCAINE HCL (PF) 1 % IJ SOLN
INTRAMUSCULAR | Status: AC
  Filled 2014-10-17: qty 30

## 2014-10-17 SURGICAL SUPPLY — 11 items
CATH INFINITI 5FR MULTPACK ANG (CATHETERS) ×3 IMPLANT
CATH SWAN GANZ 7F STRAIGHT (CATHETERS) ×3 IMPLANT
KIT HEART LEFT (KITS) ×3 IMPLANT
KIT HEART RIGHT NAMIC (KITS) ×3 IMPLANT
PACK CARDIAC CATHETERIZATION (CUSTOM PROCEDURE TRAY) ×3 IMPLANT
SHEATH PINNACLE 5F 10CM (SHEATH) ×3 IMPLANT
SHEATH PINNACLE 7F 10CM (SHEATH) ×3 IMPLANT
SYR MEDRAD MARK V 150ML (SYRINGE) ×3 IMPLANT
TRANSDUCER W/STOPCOCK (MISCELLANEOUS) ×6 IMPLANT
WIRE EMERALD 3MM-J .025X260CM (WIRE) ×3 IMPLANT
WIRE EMERALD 3MM-J .035X150CM (WIRE) ×3 IMPLANT

## 2014-10-17 NOTE — Interval H&P Note (Signed)
Cath Lab Visit (complete for each Cath Lab visit)  Clinical Evaluation Leading to the Procedure:   ACS: No.  Non-ACS:    Anginal Classification: CCS IV  Anti-ischemic medical therapy: Maximal Therapy (2 or more classes of medications)  Non-Invasive Test Results: No non-invasive testing performed  Prior CABG: No previous CABG      History and Physical Interval Note:  10/17/2014 1:24 PM  Travis Romero  has presented today for surgery, with the diagnosis of cp  The various methods of treatment have been discussed with the patient and family. After consideration of risks, benefits and other options for treatment, the patient has consented to  Procedure(s): Right/Left Heart Cath and Coronary Angiography (N/A) as a surgical intervention .  The patient's history has been reviewed, patient examined, no change in status, stable for surgery.  I have reviewed the patient's chart and labs.  Questions were answered to the patient's satisfaction.     Cortny Bambach A

## 2014-10-17 NOTE — Care Management Note (Signed)
Case Management Note  Patient Details  Name: Travis Romero MRN: [id number] Date of Birth: [birth year]  Subjective/Objective:      Admitted with CHF              Action/Plan: Patient transferred from [facility] to [facility] for Heart Catherization; CM will continue to follow for DCP.  Expected Discharge Date:    possibly 10/20/2014              Expected Discharge Plan:   home with possible [place]   Status of Service:   In progress  Sherrilyn Rist [phone] 10/17/2014, 3:25 PM

## 2014-10-17 NOTE — Progress Notes (Signed)
Rt groin site post sheath pull level 0. Rt dp 1+. Transferred to 3E05 in bed, portable O2 and moniter.

## 2014-10-17 NOTE — Progress Notes (Signed)
Subjective: Pt tired  No CP   Breathing is fair   Objective: Filed Vitals:   10/16/14 1913 10/16/14 2214 10/17/14 0134 10/17/14 0434  BP: 138/84 151/81 155/85 164/82  Pulse: 109 101 103 111  Temp: 98.7 F (37.1 C) 98.2 F (36.8 C) 98.4 F (36.9 C) 98.3 F (36.8 C)  TempSrc: Oral Axillary Oral Oral  Resp: 24 24 20 20   Height:      Weight:    131 lb 6.3 oz (59.6 kg)  SpO2: 97% 98% 97% 93%   Weight change: -7.1 oz (-0.2 kg)  Intake/Output Summary (Last 24 hours) at 10/17/14 0815 Last data filed at 10/17/14 0714  Gross per 24 hour  Intake    410 ml  Output   1695 ml  Net  -1285 ml   I/O  13.9 L negative    General: Alert, awake, oriented x3, in no acute distress Neck:  JVP is normal Heart: Regular rate and rhythm, without murmurs, rubs, gallops.  Lungs: Clear to auscultation.  No rales or wheezes. Exemities:  No edema.   Neuro: Grossly intact, nonfocal.  Tele:  SR    Lab Results: Results for orders placed or performed during the hospital encounter of 10/11/14 (from the past 24 hour(s))  Protein, pleural or peritoneal fluid     Status: None   Collection Time: 10/16/14  2:10 PM  Result Value Ref Range   Total protein, fluid <3.0 g/dL   Fluid Type-FTP PLEURAL   pH, body fluid     Status: None   Collection Time: 10/16/14  2:10 PM  Result Value Ref Range   pH, Fluid Type      CORRECTED ON 07/13 AT 1420: PREVIOUSLY REPORTED AS PLEURAL R   pH, Fluid 8.00   Gram stain     Status: None (Preliminary result)   Collection Time: 10/16/14  2:10 PM  Result Value Ref Range   Specimen Description FLUID PLEURAL    Special Requests BOTTLES DRAWN AEROBIC ONLY 5CC    Gram Stain      WBC PRESENT,BOTH PMN AND MONONUCLEAR NO ORGANISMS SEEN CYTOSPIN SLIDE Performed at [facility]    Report Status PENDING   Lactate dehydrogenase (CSF, pleural or peritoneal fluid)     Status: Abnormal   Collection Time: 10/16/14  2:13 PM  Result Value Ref Range   LD, Fluid 104 (H) 3 -  23 U/L   Fluid Type-FLDH PLEURAL   Body fluid cell count with differential     Status: Abnormal   Collection Time: 10/16/14  2:13 PM  Result Value Ref Range   Fluid Type-FCT PLEURAL    Color, Fluid YELLOW YELLOW   Appearance, Fluid HAZY (A) CLEAR   WBC, Fluid 418 0 - 1000 cu mm   Neutrophil Count, Fluid 15 0 - 25 %   Lymphs, Fluid 40 %   Monocyte-Macrophage-Serous Fluid 45 (L) 50 - 90 %   Eos, Fluid 0 %   Other Cells, Fluid CORRELATE WITH CYTOLOGY. %    Studies/Results: Dg Chest Port 1 View  10/16/2014   CLINICAL DATA:  64 year old male status post thoracentesis  EXAM: PORTABLE CHEST - 1 VIEW  COMPARISON:  Prior chest x-ray 10/15/2014  FINDINGS: Decreased opacity at the right lung base likely secondary to interval removal of pleural fluid. Decreased right basilar atelectasis. Similar appearance of severe emphysema with bullous change in the upper lung. No evidence of pneumothorax. Persistent layering left pleural effusion and associated left basilar atelectasis versus infiltrate.  Cardiac and mediastinal contour and remain unchanged. Persistent aortic atherosclerosis.  IMPRESSION: 1. Decreased right-sided pleural effusion following thoracentesis. No evidence of pneumothorax or other complication. 2. Persistent left basilar opacity likely reflecting left-sided pleural effusion with atelectasis and/or infiltrate.   Electronically Signed   By: Jacqulynn Cadet M.D.   On: 10/16/2014 12:24    Medications: Reviewed    @PROBHOSP @  1  Acute systolic CHF  Pt transferred from [place] yesterday  Followed by [facility] Coyer  Signif diureisis while there .  Volume status is not bad   Plan for R and L heart catheterization today    2.  HTN  BP is improved with addition of meds   Will need to follow  3.  Renal  Improved from earlier admit    LOS: 5 days   Dorris Carnes 10/17/2014, 8:15 AM

## 2014-10-17 NOTE — Progress Notes (Signed)
Site area: RFA Site Prior to Removal:  Level 0 Pressure Applied For:25 min Manual:  yes  Patient Status During Pull:  stable Post Pull Site:  Level 0 Post Pull Instructions Given:  yes Post Pull Pulses Present: palpable Dressing Applied:  clear Bedrest begins @ 1500 Comments:

## 2014-10-17 NOTE — Progress Notes (Signed)
Heart Failure Navigator Consult Note  Presentation: Travis Romero presented with progressive dyspnea of one week's duration. He has been taking combivent for COPD without improvement in her symptoms. He has been prescribed a muscle relaxant about a week ago for neck pain (flexeril and tramadol), and wondered if he was having a reaction to the same. He had not been having chest pain. He presented to the ED with progressive breathlessness and was placed on BiPAP. He became more dyspnic and was ultimately intubated.    Past Medical History  Diagnosis Date  . Hypertension   . COPD (chronic obstructive pulmonary disease)   . BPH (benign prostatic hyperplasia)   . Back pain 2007    after fall down steps    History   Social History  . Marital Status: Divorced    Spouse Name: n/a  . Number of Children: 0  . Years of Education: master's   Occupational History  . licensed clinical social worker     Warden/ranger   Social History Main Topics  . Smoking status: Current Every Day Smoker -- 20 years    Types: Cigarettes  . Smokeless tobacco: Never Used  . Alcohol Use: 1.2 - 1.8 oz/week    2-3 Standard drinks or equivalent per week  . Drug Use: No  . Sexual Activity: Not on file   Other Topics Concern  . Not on file   Social History Narrative   Lives alone.   A cousin, [name], lives nearby.    ECHO:Study Conclusions--10/12/14 - Left ventricle: The cavity size was normal. Wall thickness was increased in a pattern of moderate to severe LVH. Systolic function was severely reduced. The estimated ejection fraction was in the range of 20% to 25%. Severe diffuse hypokinesis. Although no diagnostic regional wall motion abnormality was identified, this possibility cannot be completely excluded on the basis of this study. Doppler parameters are consistent with abnormal left ventricular relaxation (grade 1 diastolic dysfunction). - Aortic valve: Trileaflet; normal thickness,  mildly calcified leaflets. - Mitral valve: There was mild regurgitation. - Left atrium: The atrium was mildly to moderately dilated. - Pulmonary arteries: Systolic pressure was mildly increased. PA peak pressure: 35 mm Hg (S).  Transthoracic echocardiography. M-mode, complete 2D, spectral Doppler, and color Doppler. Birthdate: Patient birthdate: [birth year]. Age: Patient is 64 yr old. Sex: Gender: male. BMI: 23.2 kg/m^2. Blood pressure:   125/75 Patient status: Inpatient. Study date: Study date: 10/12/2014. Study time: 10:45 AM. Location: ICU/CCU  BNP    Component Value Date/Time   BNP 981.7* 10/12/2014 1337    ProBNP No results found for: PROBNP   Education Assessment and Provision:  Detailed education and instructions provided on heart failure disease management including the following:  Signs and symptoms of Heart Failure When to call the physician Importance of daily weights Low sodium diet Fluid restriction Medication management Anticipated future follow-up appointments  Patient education given on each of the above topics.  Patient acknowledges understanding and acceptance of all instructions.  I spoke briefly with Mr. Chmiel regarding his HF.  He tells me that he has known he had "heart problems" for years.  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  He is scheduled for a cardiac catheterization later today and I told him that I would plan to return to discuss HF recommendations.  Education Materials:  "Living Better With Heart Failure" Booklet, Daily Weight Tracker Tool  High Risk Criteria for Readmission and/or Poor Patient Outcomes:   EF <30%- Yes 20-25% and grade 1 dias dys  2 or more admissions in 6 months- No  Difficult social situation- No --he says that he has family support locally  Demonstrates medication noncompliance- Denies   Barriers of Care:  Knowledge and  compliance--?overconfidence of knowledge  Discharge Planning:  Plans to discharge to home alone--May benefit HHRN to continue ongoing education

## 2014-10-17 NOTE — H&P (View-Only) (Signed)
Subjective: Pt tired  No CP   Breathing is fair   Objective: Filed Vitals:   10/16/14 1913 10/16/14 2214 10/17/14 0134 10/17/14 0434  BP: 138/84 151/81 155/85 164/82  Pulse: 109 101 103 111  Temp: 98.7 F (37.1 C) 98.2 F (36.8 C) 98.4 F (36.9 C) 98.3 F (36.8 C)  TempSrc: Oral Axillary Oral Oral  Resp: 24 24 20 20   Height:      Weight:    131 lb 6.3 oz (59.6 kg)  SpO2: 97% 98% 97% 93%   Weight change: -7.1 oz (-0.2 kg)  Intake/Output Summary (Last 24 hours) at 10/17/14 0815 Last data filed at 10/17/14 0714  Gross per 24 hour  Intake    410 ml  Output   1695 ml  Net  -1285 ml   I/O  13.9 L negative    General: Alert, awake, oriented x3, in no acute distress Neck:  JVP is normal Heart: Regular rate and rhythm, without murmurs, rubs, gallops.  Lungs: Clear to auscultation.  No rales or wheezes. Exemities:  No edema.   Neuro: Grossly intact, nonfocal.  Tele:  SR    Lab Results: Results for orders placed or performed during the hospital encounter of 10/11/14 (from the past 24 hour(s))  Protein, pleural or peritoneal fluid     Status: None   Collection Time: 10/16/14  2:10 PM  Result Value Ref Range   Total protein, fluid <3.0 g/dL   Fluid Type-FTP PLEURAL   pH, body fluid     Status: None   Collection Time: 10/16/14  2:10 PM  Result Value Ref Range   pH, Fluid Type      CORRECTED ON 07/13 AT 1420: PREVIOUSLY REPORTED AS PLEURAL R   pH, Fluid 8.00   Gram stain     Status: None (Preliminary result)   Collection Time: 10/16/14  2:10 PM  Result Value Ref Range   Specimen Description FLUID PLEURAL    Special Requests BOTTLES DRAWN AEROBIC ONLY 5CC    Gram Stain      WBC PRESENT,BOTH PMN AND MONONUCLEAR NO ORGANISMS SEEN CYTOSPIN SLIDE Performed at [facility]    Report Status PENDING   Lactate dehydrogenase (CSF, pleural or peritoneal fluid)     Status: Abnormal   Collection Time: 10/16/14  2:13 PM  Result Value Ref Range   LD, Fluid 104 (H) 3 -  23 U/L   Fluid Type-FLDH PLEURAL   Body fluid cell count with differential     Status: Abnormal   Collection Time: 10/16/14  2:13 PM  Result Value Ref Range   Fluid Type-FCT PLEURAL    Color, Fluid YELLOW YELLOW   Appearance, Fluid HAZY (A) CLEAR   WBC, Fluid 418 0 - 1000 cu mm   Neutrophil Count, Fluid 15 0 - 25 %   Lymphs, Fluid 40 %   Monocyte-Macrophage-Serous Fluid 45 (L) 50 - 90 %   Eos, Fluid 0 %   Other Cells, Fluid CORRELATE WITH CYTOLOGY. %    Studies/Results: Dg Chest Port 1 View  10/16/2014   CLINICAL DATA:  64 year old male status post thoracentesis  EXAM: PORTABLE CHEST - 1 VIEW  COMPARISON:  Prior chest x-ray 10/15/2014  FINDINGS: Decreased opacity at the right lung base likely secondary to interval removal of pleural fluid. Decreased right basilar atelectasis. Similar appearance of severe emphysema with bullous change in the upper lung. No evidence of pneumothorax. Persistent layering left pleural effusion and associated left basilar atelectasis versus infiltrate.  Cardiac and mediastinal contour and remain unchanged. Persistent aortic atherosclerosis.  IMPRESSION: 1. Decreased right-sided pleural effusion following thoracentesis. No evidence of pneumothorax or other complication. 2. Persistent left basilar opacity likely reflecting left-sided pleural effusion with atelectasis and/or infiltrate.   Electronically Signed   By: Jacqulynn Cadet M.D.   On: 10/16/2014 12:24    Medications: Reviewed    @PROBHOSP @  1  Acute systolic CHF  Pt transferred from [place] yesterday  Followed by Thurman Coyer  Signif diureisis while there .  Volume status is not bad   Plan for R and L heart catheterization today    2.  HTN  BP is improved with addition of meds   Will need to follow  3.  Renal  Improved from earlier admit    LOS: 5 days   Dorris Carnes 10/17/2014, 8:15 AM

## 2014-10-17 NOTE — Progress Notes (Signed)
[facility] PROGRESS NOTE  Travis Romero YHC:[id number] DOB: 10-Jun-1950 DOA: 10/11/2014 PCP: Orpah Melter, MD  Assessment/Plan: 1-acute hypoxic/hypercapnic respiratory failure secondary to acute pulmonary edema and acute on chronic combined CHF exacerbation -Patient breathing better and easier -Extubated on 10/14/14 -Blood pressure, electrolytes and renal function tolerating current diuresis -Patient ejection fraction 20-25% -Will continue Lasix by mouth twice a day, continue carvedilol and Entresto -Will continue aggressive protocol hygiene, continue scheduled bronchodilators and the use of incentive spirometry and flutter valve -Cardiology is on before and helping managing patient's heart failure  -plan is for right/left heart cath on 7/14 to r/o component of ischemic process on his heart failure -Continue slowly weaning oxygen supplementation as tolerated.  2-acute kidney injury: On ensure of patient baseline renal function. Most likely associated with decreased perfusion with acute on chronic CHF exacerbation -Creatinine now within normal limits and a GFR suggesting a stage II -Will monitor his renal function especially after catheterization as plan for today 7/14  3-leukocytosis: Patient without fever and nontoxic. Unclear etiology -Will follow WBCs trend -no fever -will hold on antibiotics  4-anxiety: Continue when necessary Xanax  5-physical deconditioning: Follow physical therapy and occupational therapy recommendations  Code Status: Full Family Communication: no family at bedside Disposition Plan: to be determine    Consultants: Cardiology PC CM  Antibiotics:  None   HPI/Subjective: Denies chest pain, abdominal pain. Patient endorses improvement in his breathing   Objective: Filed Vitals:   10/17/14 1422  BP:   Pulse: 0  Temp:   Resp: 10    Intake/Output Summary (Last 24 hours) at 10/17/14 1424 Last data filed at 10/17/14 1108  Gross per 24  hour  Intake    120 ml  Output    870 ml  Net   -750 ml   Filed Weights   10/15/14 0400 10/16/14 0500 10/17/14 0434  Weight: 65.3 kg (143 lb 15.4 oz) 59.8 kg (131 lb 13.4 oz) 59.6 kg (131 lb 6.3 oz)    Exam:   General:  Frail, chronically ill in appearance; afebrile. Reports no chest pain and improvement on his breathing.  Cardiovascular: S1 and S2, no rubs, no gallops; mild JVD appreciated on exam  Respiratory: No frank crackles, scattered rhonchi, no wheezing  Abdomen: Positive bowel sounds, no distention, no guarding  Musculoskeletal: Trace edema bilaterally, no cyanosis or clubbing.  Data Reviewed: Basic Metabolic Panel:  Recent Labs Lab 10/12/14 0351  10/13/14 0359 10/13/14 1736 10/14/14 0405 10/15/14 0335 10/16/14 0342 10/17/14 1042  NA 133*  < > 132* 134* 133* 132* 133* 130*  K 5.1  < > 4.5 3.9 4.0 3.6 4.0 5.1  CL 98*  < > 97* 96* 94* 94* 96* 95*  CO2 25  < > 27 28 30 31 28 26   GLUCOSE 110*  < > 110* 86 92 141* 153* 179*  BUN 23*  < > 23* 21* 19 18 17 20   CREATININE 1.30*  < > 1.72* 1.61* 1.59* 1.32* 0.95 1.12  CALCIUM 8.9  < > 8.4* 8.5* 8.4* 7.8* 8.5* 8.8*  MG 2.1  --  1.9  --  1.9 1.7  --   --   PHOS 4.7*  --  4.5  --  3.9 3.5  --   --   < > = values in this interval not displayed. Liver Function Tests:  Recent Labs Lab 10/11/14 1959 10/13/14 1736 10/16/14 0342  AST 29 13*  --   ALT 26 15*  --   ALKPHOS 90  60  --   BILITOT 0.6 0.4  --   PROT 7.1 5.2* 5.8*  ALBUMIN 3.2* 2.4*  --     Recent Labs Lab 10/11/14 1958  AMMONIA 23   CBC:  Recent Labs Lab 10/11/14 1959 10/12/14 0427 10/14/14 0405 10/15/14 0335 10/17/14 1042  WBC 10.2 13.4* 10.2 10.0 22.9*  NEUTROABS 7.5  --  7.0  --  18.9*  HGB 15.7 14.1 13.3 12.7* 15.3  HCT 47.4 42.6 39.6 38.2* 46.1  MCV 109.7* 110.4* 109.1* 108.5* 107.2*  PLT 391 330 289 283 279   Cardiac Enzymes:  Recent Labs Lab 10/11/14 1959 10/12/14 0106 10/12/14 0710 10/12/14 1337  TROPONINI 0.44* 0.35*  0.42* 0.34*   BNP (last 3 results)  Recent Labs  10/11/14 1959 10/12/14 1337  BNP 1073.2* 981.7*   CBG: No results for input(s): GLUCAP in the last 168 hours.  Recent Results (from the past 240 hour(s))  MRSA PCR Screening     Status: None   Collection Time: 10/12/14  5:29 AM  Result Value Ref Range Status   MRSA by PCR NEGATIVE NEGATIVE Final    Comment:        The GeneXpert MRSA Assay (FDA approved for NASAL specimens only), is one component of a comprehensive MRSA colonization surveillance program. It is not intended to diagnose MRSA infection nor to guide or monitor treatment for MRSA infections.   Gram stain     Status: None (Preliminary result)   Collection Time: 10/16/14  2:10 PM  Result Value Ref Range Status   Specimen Description FLUID PLEURAL  Final   Special Requests BOTTLES DRAWN AEROBIC ONLY 5CC  Final   Gram Stain   Final    WBC PRESENT,BOTH PMN AND MONONUCLEAR NO ORGANISMS SEEN CYTOSPIN SLIDE Performed at [facility]    Report Status PENDING  Incomplete     Studies: Dg Chest Port 1 View  10/16/2014   CLINICAL DATA:  64 year old male status post thoracentesis  EXAM: PORTABLE CHEST - 1 VIEW  COMPARISON:  Prior chest x-ray 10/15/2014  FINDINGS: Decreased opacity at the right lung base likely secondary to interval removal of pleural fluid. Decreased right basilar atelectasis. Similar appearance of severe emphysema with bullous change in the upper lung. No evidence of pneumothorax. Persistent layering left pleural effusion and associated left basilar atelectasis versus infiltrate. Cardiac and mediastinal contour and remain unchanged. Persistent aortic atherosclerosis.  IMPRESSION: 1. Decreased right-sided pleural effusion following thoracentesis. No evidence of pneumothorax or other complication. 2. Persistent left basilar opacity likely reflecting left-sided pleural effusion with atelectasis and/or infiltrate.   Electronically Signed   By: Jacqulynn Cadet M.D.   On: 10/16/2014 12:24    Scheduled Meds: . carvedilol  6.25 mg Oral BID WC  . cloNIDine  0.2 mg Transdermal Weekly  . dutasteride  0.5 mg Oral Daily  . furosemide  40 mg Oral BID  . heparin  5,000 Units Subcutaneous 3 times per day  . hydrALAZINE  25 mg Oral 3 times per day  . mirtazapine  45 mg Oral QHS  . sacubitril-valsartan  1 tablet Oral BID  . sodium chloride  3 mL Intravenous Q12H  . spironolactone  75 mg Oral Daily   Continuous Infusions: . sodium chloride 70 mL/hr at 10/17/14 1129    Active Problems:   CHF exacerbation   Acute respiratory failure with hypoxia   Acute combined systolic and diastolic CHF, NYHA class 4   Acute pulmonary edema   Endotracheal tube present  Time spent: 35 minutes     Barton Dubois  [facility] Pager [phone]. If 7PM-7AM, please contact night-coverage at [email or web address], password [facility] 10/17/2014, 2:24 PM  LOS: 5 days

## 2014-10-17 NOTE — Progress Notes (Signed)
Report called to [facility] on 3E by Arbie Cookey

## 2014-10-17 NOTE — Progress Notes (Signed)
Rt fa and fv  sheath in, site level 0. Rt dp and rt pt 1+.

## 2014-10-18 ENCOUNTER — Encounter (HOSPITAL_COMMUNITY): Payer: Self-pay | Admitting: Cardiovascular Disease

## 2014-10-18 ENCOUNTER — Inpatient Hospital Stay (HOSPITAL_COMMUNITY): Payer: PRIVATE HEALTH INSURANCE

## 2014-10-18 DIAGNOSIS — E785 Hyperlipidemia, unspecified: Secondary | ICD-10-CM

## 2014-10-18 DIAGNOSIS — R339 Retention of urine, unspecified: Secondary | ICD-10-CM

## 2014-10-18 DIAGNOSIS — I739 Peripheral vascular disease, unspecified: Secondary | ICD-10-CM

## 2014-10-18 DIAGNOSIS — F418 Other specified anxiety disorders: Secondary | ICD-10-CM

## 2014-10-18 DIAGNOSIS — I25110 Atherosclerotic heart disease of native coronary artery with unstable angina pectoris: Secondary | ICD-10-CM

## 2014-10-18 LAB — PULMONARY FUNCTION TEST
FEF 25-75 Post: 0.45 L/sec
FEF 25-75 Pre: 0.32 L/sec
FEF2575-%Change-Post: 40 %
FEF2575-%PRED-PRE: 12 %
FEF2575-%Pred-Post: 17 %
FEV1-%Change-Post: 10 %
FEV1-%PRED-POST: 34 %
FEV1-%Pred-Pre: 30 %
FEV1-Post: 1.06 L
FEV1-Pre: 0.96 L
FEV1FVC-%CHANGE-POST: 0 %
FEV1FVC-%PRED-PRE: 57 %
FEV6-%Change-Post: 10 %
FEV6-%Pred-Post: 54 %
FEV6-%Pred-Pre: 49 %
FEV6-Post: 2.14 L
FEV6-Pre: 1.95 L
FEV6FVC-%Change-Post: 0 %
FEV6FVC-%PRED-POST: 92 %
FEV6FVC-%Pred-Pre: 92 %
FVC-%Change-Post: 10 %
FVC-%Pred-Post: 59 %
FVC-%Pred-Pre: 53 %
FVC-POST: 2.45 L
FVC-Pre: 2.22 L
Post FEV1/FVC ratio: 43 %
Post FEV6/FVC ratio: 88 %
Pre FEV1/FVC ratio: 43 %
Pre FEV6/FVC Ratio: 88 %

## 2014-10-18 LAB — BASIC METABOLIC PANEL
Anion gap: 7 (ref 5–15)
BUN: 22 mg/dL — ABNORMAL HIGH (ref 6–20)
CALCIUM: 8.7 mg/dL — AB (ref 8.9–10.3)
CHLORIDE: 96 mmol/L — AB (ref 101–111)
CO2: 28 mmol/L (ref 22–32)
Creatinine, Ser: 1.13 mg/dL (ref 0.61–1.24)
GFR calc Af Amer: >60 mL/min (ref >60)
GFR calc non Af Amer: >60 mL/min (ref >60)
Glucose, Bld: 131 mg/dL — ABNORMAL HIGH (ref 65–99)
Potassium: 5.1 mmol/L (ref 3.5–5.1)
SODIUM: 131 mmol/L — AB (ref 135–145)

## 2014-10-18 LAB — CBC
HCT: 41.7 % (ref 39.0–52.0)
Hemoglobin: 13.8 g/dL (ref 13.0–17.0)
MCH: 35.9 pg — ABNORMAL HIGH (ref 26.0–34.0)
MCHC: 33.1 g/dL (ref 30.0–36.0)
MCV: 108.6 fL — AB (ref 78.0–100.0)
Platelets: 270 10*3/uL (ref 150–400)
RBC: 3.84 MIL/uL — AB (ref 4.22–5.81)
RDW: 14 % (ref 11.5–15.5)
WBC: 15.9 10*3/uL — ABNORMAL HIGH (ref 4.0–10.5)

## 2014-10-18 LAB — POCT I-STAT 3, ART BLOOD GAS (G3+)
ACID-BASE EXCESS: 1 mmol/L (ref 0.0–2.0)
Bicarbonate: 27.4 mEq/L — ABNORMAL HIGH (ref 20.0–24.0)
O2 Saturation: 93 %
TCO2: 29 mmol/L (ref 0–100)
pCO2 arterial: 48 mmHg — ABNORMAL HIGH (ref 35.0–45.0)
pH, Arterial: 7.364 (ref 7.350–7.450)
pO2, Arterial: 69 mmHg — ABNORMAL LOW (ref 80.0–100.0)

## 2014-10-18 MED ORDER — POLYETHYLENE GLYCOL 3350 17 G PO PACK
17.0000 g | PACK | Freq: Every day | ORAL | Status: DC
  Administered 2014-10-19: 17 g via ORAL
  Filled 2014-10-18 (×6): qty 1

## 2014-10-18 MED ORDER — DOCUSATE SODIUM 50 MG PO CAPS
50.0000 mg | ORAL_CAPSULE | Freq: Two times a day (BID) | ORAL | Status: DC
  Administered 2014-10-18 – 2014-10-22 (×9): 50 mg via ORAL
  Filled 2014-10-18 (×12): qty 1

## 2014-10-18 MED ORDER — BISACODYL 10 MG RE SUPP: 10.0000 mg | Freq: Every day | RECTAL | Status: DC | PRN

## 2014-10-18 MED ORDER — ALBUTEROL SULFATE (2.5 MG/3ML) 0.083% IN NEBU
2.5000 mg | INHALATION_SOLUTION | Freq: Once | RESPIRATORY_TRACT | Status: AC
  Administered 2014-10-18: 2.5 mg via RESPIRATORY_TRACT

## 2014-10-18 NOTE — Progress Notes (Signed)
PT Cancellation Note  Patient Details Name: Travis Romero MRN: [id number] DOB: 1950/10/26   Cancelled Treatment:    Reason Eval/Treat Not Completed: Patient at procedure or test/unavailable (pt leaving for vascular lab and unavailable)   [name] 10/18/2014, 9:07 AM Elwyn Reach, [place]

## 2014-10-18 NOTE — Progress Notes (Signed)
Pre-op Cardiac Surgery  Carotid Findings:  Findings suggest 1-39% internal carotid artery stenosis bilaterally. Vertebral arteries are patent with antegrade.  Upper Extremity Right Left  Brachial Pressures 122-Triphasic 130-Triphasic  Radial Waveforms Triphasic Triphasic  Ulnar Waveforms Triphasic Triphasic  Palmar Arch (Allen's Test) Signal obliterates with radial compression, decreases >50% with ulnar compression. Unable to evaluate.       Lower  Extremity Right Left  Dorsalis Pedis 60-Severely dampened monophasic Unable to insonate  Anterior Tibial    Posterior Tibial 71-Dampened monophasic 81-Dampened monophasic  Ankle/Brachial Indices 0.55 0.62    Findings:   The right ABI is suggestive of moderate, borderline severe arterial insufficiency. The left ABI is suggestive of moderate arterial insufficiency.  10/18/2014 10:17 AM Maudry Mayhew, RVT, RDCS, RDMS

## 2014-10-18 NOTE — Progress Notes (Signed)
Physical Therapy Treatment Patient Details Name: Travis Romero MRN: [id number] DOB: [birth year] Today's Date: 10/18/2014    History of Present Illness Pt was admitted with progressive dyspnea and dx'd with CHF exacerbation.  He was intubated 7/8 - 7/11.  Pt has a h/o HTN, COPD, and back pain    PT Comments    Pt insistent on needing to walk to the bathroom on arrival. Pt not receptive to lack of mobility and safety being a need for bSC. Once pt agreed to attempt standing with assist, noted he was unable he stated understanding of not being able to walk to toilet currently. Pt with improved mobility but significantly ataxic with tranfers, unable to stand with one person assist and max assist for pivot to chair. Pt would benefit from CIR as he states he was independent living alone prior to admission. Will continue to follow to maximize function with nursing staff present and aware of sequence for squat pivot.   Follow Up Recommendations  CIR;Supervision/Assistance - 24 hour     Equipment Recommendations       Recommendations for Other Services OT consult;Rehab consult     Precautions / Restrictions Precautions Precautions: Fall Precaution Comments: watch sats Restrictions Weight Bearing Restrictions: No    Mobility  Bed Mobility         Supine to sit: Min guard     General bed mobility comments: cues for sequence with heavy use of rail and decreased coordination with mobility, impulsive  Transfers Overall transfer level: Needs assistance   Transfers: Sit to/from Stand;Squat Pivot Transfers Sit to Stand: Max assist   Squat pivot transfers: Max assist     General transfer comment: attempted sit to stand total of 3 attempts from bed with cues for hand placement, bed height elevated and with or without RW but unable to fully clear buttock as pt with crouched posture and posterior lean. Pt with squat pivot using bobath technique bed to recliner as he stated he no longer  had the urgent need to void he stated upon entering room. Once in recliner RN requested transfer to [facility] for study and perfomred pivot a second time both with gait belt and max cues for sequence, safety and hand placement with RLE blocked  Ambulation/Gait                 Stairs            Wheelchair Mobility    Modified Rankin (Stroke Patients Only)       Balance Overall balance assessment: Needs assistance   Sitting balance-Leahy Scale: Fair       Standing balance-Leahy Scale: Zero                      Cognition Arousal/Alertness: Awake/alert Behavior During Therapy: WFL for tasks assessed/performed Overall Cognitive Status: Impaired/Different from baseline Area of Impairment: Safety/judgement;Following commands;Problem solving       Following Commands: Follows one step commands inconsistently Safety/Judgement: Decreased awareness of safety;Decreased awareness of deficits   Problem Solving: Slow processing;Difficulty sequencing General Comments: pt demanding to walk to bathroom but requires assist with all mobility, lack of control of bil LE with activity    Exercises General Exercises - Lower Extremity Long Arc Quad: AROM;Seated;Both;15 reps Hip ABduction/ADduction: AROM;Seated;Both;15 reps Hip Flexion/Marching: AROM;Seated;Both;15 reps Toe Raises: AROM;Seated;Both;15 reps Heel Raises: AROM;Seated;Both;15 reps    General Comments        Pertinent Vitals/Pain Pain Assessment: No/denies pain  sats 93% on 3L with  drop to 82% on RA with O2 reapplied    Home Living                      Prior Function            PT Goals (current goals can now be found in the care plan section) Progress towards PT goals: Progressing toward goals    Frequency  Min 3X/week    PT Plan Current plan remains appropriate    Co-evaluation             End of Session Equipment Utilized During Treatment: Gait belt;Oxygen Activity Tolerance:  Patient tolerated treatment well Patient left: in chair;with call bell/phone within reach;with chair alarm set     Time: 2979-8921 PT Time Calculation (min) (ACUTE ONLY): 39 min  Charges:  $Therapeutic Exercise: 8-22 mins $Therapeutic Activity: 23-37 mins                    G Codes:      Melford Aase 11/07/14, 12:39 PM Elwyn Reach, [place]

## 2014-10-18 NOTE — Progress Notes (Signed)
Rehab Admissions Coordinator Note:  Patient was screened by Cleatrice Burke for appropriateness for an Inpatient Acute Rehab Consult per PT recommendation. At this time, we are recommending [place]. [facility] would not approve pt for this diagnosis, CHF.   Cleatrice Burke 10/18/2014, 1:57 PM  I can be reached at [phone].

## 2014-10-18 NOTE — Progress Notes (Signed)
Occupational Therapy Treatment Patient Details Name: Travis Romero MRN: [id number] DOB: July 21, 1950 Today's Date: 10/18/2014    History of present illness Pt was admitted with progressive dyspnea and dx'd with CHF exacerbation.  He was intubated 7/8 - 7/11.  Pt has a h/o HTN, COPD, and back pain   OT comments  Pt alert and impulsive with repeated attempts to get OOB with assist according to RN.  Pt with improved mobility and activity tolerance with no c/o pain this visit.  Pt tolerated therapeutic exercise B UEs with level 1 theraband.  Encouraged pt to continue through the weekend.  Questionable if pt will recall.  Follow Up Recommendations  SNF;Supervision/Assistance - 24 hour    Equipment Recommendations       Recommendations for Other Services      Precautions / Restrictions Precautions Precautions: Fall Precaution Comments: watch sats       Mobility Bed Mobility         Supine to sit: Min guard Sit to supine: Min guard   General bed mobility comments: no physical assist, heavy use of rail, HOB up, inefficient technique  Transfers          Balance Overall balance assessment: Needs assistance   Sitting balance-Leahy Scale: Fair       Standing balance-Leahy Scale: Zero                     ADL Overall ADL's : Needs assistance/impaired                                       General ADL Comments: UB ther ex with level 1 theraband x 10 reps--horizontal abd/add, shoulder flex/ext at bed level.  Unable to balance in sitting to perform ther ex at EOB.      Vision                     Perception     Praxis      Cognition   Behavior During Therapy: Impulsive Overall Cognitive Status: Impaired/Different from baseline Area of Impairment: Safety/judgement;Following commands;Problem solving;Attention   Current Attention Level: Sustained Memory: Decreased short-term memory (did not recall his nurse within 5 minutes after  she left roo)  Following Commands: Follows one step commands inconsistently Safety/Judgement: Decreased awareness of safety;Decreased awareness of deficits   Problem Solving: Slow processing;Difficulty sequencing General Comments: per nurse, pt repeatedly attempting to get OOB without assist    Extremity/Trunk Assessment               Exercises    Shoulder Instructions       General Comments      Pertinent Vitals/ Pain       Pain Assessment: No/denies pain  Home Living                                          Prior Functioning/Environment              Frequency       Progress Toward Goals  OT Goals(current goals can now be found in the care plan section)  Progress towards OT goals: Progressing toward goals     Plan Discharge plan remains appropriate    Co-evaluation  End of Session     Activity Tolerance Patient tolerated treatment well   Patient Left in bed;with call bell/phone within reach;with bed alarm set   Nurse Communication          Time: 2774-1287 OT Time Calculation (min): 20 min  Charges: OT General Charges $OT Visit: 1 Procedure OT Treatments $Therapeutic Exercise: 8-22 mins  [name] 10/18/2014, 3:49 PM  [phone]

## 2014-10-18 NOTE — Consult Note (Signed)
Reason for Consult:3 vessel CAD with ischemic cardiomyopathy Referring Physician: Dr. Vilma Prader Travis Romero is an 64 y.o. male.  HPI: 64 yo man presented last Friday with a cc/o shortness of breath.  He is a 64 yo man with a history of tobacco abuse, COPD, PAD, BPH, hypertension and adjustment disorder. He has no prior cardiac history. He had been getting progressively more short of breath for about a week PTA. He was at work last Friday and a co-worker noted him laboring to breathe. EMS was called and he was brought to the ED. He had progressive acute on chronic respiratory failure and was intubated. It was determined he had acute systolic and diastolic CHF. He was weaned and extubated. An echo showed severe LV dysfunction with an EF of 20-25% and mild MR. Yesterday he had cardiac catheterization and was found to have severe 3 vessel CAD. He had Severe LV dysfunction. There was 2+ MR in the setting of a PVC.   He denies any chest pain, pressure or tightness. He does have exertional shortness of breath with "not much" exertion. He has claudication if he walks too fast. He denies peripheral edema but did have scrotal swelling PTA.  Past Medical History  Diagnosis Date  . Hypertension   . COPD (chronic obstructive pulmonary disease)   . BPH (benign prostatic hyperplasia)   . Back pain 2007    after fall down steps  Peripheral arterial disease  Past Surgical History  Procedure Laterality Date  . Tonsillectomy    . Pyloromyotomy      pyloric stenosis  . Cardiac catheterization N/A 10/17/2014    Procedure: Right/Left Heart Cath and Coronary Angiography;  Surgeon: Troy Sine, MD;  Location: [facility];  Service: Cardiovascular;  Laterality: N/A;    Family History  Problem Relation Age of Onset  . Hypertension Mother   . Heart disease Father     Social History:  reports that he has been smoking Cigarettes.  He has smoked for the past 20 years. He has never used smokeless  tobacco. He reports that he drinks about 1.2 - 1.8 oz of alcohol per week. He reports that he does not use illicit drugs.  Allergies:  Allergies  Allergen Reactions  . Advair Diskus [Fluticasone-Salmeterol] Other (See Comments)    Exacerbated BPH   . Flexeril [Cyclobenzaprine] Other (See Comments)    Caused shortness of breath and swelling in scrotum    Medications:  Prior to Admission:  Prescriptions prior to admission  Medication Sig Dispense Refill Last Dose  . ALPRAZolam (XANAX) 0.5 MG tablet TAKE 1 TABLET BY MOUTH 3 TIMES A DAY AS NEEDED FOR 90 DAYS  0 10/10/2014 at Unknown time  . benzonatate (TESSALON) 100 MG capsule Take 100-200 mg by mouth 3 (three) times daily as needed for cough.   10/10/2014 at Unknown time  . cloNIDine (CATAPRES) 0.2 MG tablet Take 0.2 mg by mouth 4 (four) times daily as needed.    10/10/2014 at Unknown time  . dutasteride (AVODART) 0.5 MG capsule Take 0.5 mg by mouth daily.  0 10/10/2014 at Unknown time  . Ipratropium-Albuterol (COMBIVENT IN) Inhale into the lungs.   Taking  . isradipine (DYNACIRC) 5 MG capsule Take 10 mg by mouth 2 (two) times daily.  1 10/10/2014 at Unknown time  . mirtazapine (REMERON) 45 MG tablet Take 45 mg by mouth at bedtime.   10/10/2014 at Unknown time  . spironolactone (ALDACTONE) 50 MG tablet Take 75 mg by  mouth daily.   10/10/2014 at Unknown time  . telmisartan (MICARDIS) 80 MG tablet Take 80 mg by mouth daily.  1 10/10/2014 at Unknown time  . traMADol (ULTRAM) 50 MG tablet Take 1 tablet (50 mg total) by mouth every 8 (eight) hours as needed. 30 tablet 0 10/10/2014 at Unknown time  . cyclobenzaprine (FLEXERIL) 10 MG tablet Take 1 tablet (10 mg total) by mouth 3 (three) times daily as needed for muscle spasms. (Patient not taking: Reported on 10/11/2014) 30 tablet 0 Not Taking at Unknown time    Results for orders placed or performed during the hospital encounter of 10/11/14 (from the past 48 hour(s))  Protein, pleural or peritoneal fluid      Status: None   Collection Time: 10/16/14  2:10 PM  Result Value Ref Range   Total protein, fluid <3.0 g/dL    Comment: REPEATED TO VERIFY (NOTE) No normal range established for this test Results should be evaluated in conjunction with serum values Performed at [place]: RIGHT CORRECTED ON 07/13 AT 1419: PREVIOUSLY REPORTED AS Pleural R   pH, body fluid     Status: None   Collection Time: 10/16/14  2:10 PM  Result Value Ref Range   pH, Fluid Type      CORRECTED ON 07/13 AT 1420: PREVIOUSLY REPORTED AS PLEURAL R    Comment: CORRECTED ON 07/13 AT 2152: PREVIOUSLY REPORTED AS PLEURAL RIGHT, CORRECTED ON 07/13 AT 1420: PREVIOUSLY REPORTED AS Pleural R   pH, Fluid 8.00     Comment: Performed at [facility], body fluid-bottle     Status: None (Preliminary result)   Collection Time: 10/16/14  2:10 PM  Result Value Ref Range   Specimen Description FLUID PLEURAL    Special Requests BOTTLES DRAWN AEROBIC ONLY 5CC    Culture      NO GROWTH < 24 HOURS Performed at [facility]    Report Status PENDING   Gram stain     Status: None (Preliminary result)   Collection Time: 10/16/14  2:10 PM  Result Value Ref Range   Specimen Description FLUID PLEURAL    Special Requests BOTTLES DRAWN AEROBIC ONLY 5CC    Gram Stain      WBC PRESENT,BOTH PMN AND MONONUCLEAR NO ORGANISMS SEEN CYTOSPIN SLIDE Performed at [facility]    Report Status PENDING   Lactate dehydrogenase (CSF, pleural or peritoneal fluid)     Status: Abnormal   Collection Time: 10/16/14  2:13 PM  Result Value Ref Range   LD, Fluid 104 (H) 3 - 23 U/L    Comment: (NOTE) Results should be evaluated in conjunction with serum values Performed at [facility]    Fluid Type-FLDH PLEURAL     Comment: RIGHT CORRECTED ON 07/13 AT 1419: PREVIOUSLY REPORTED AS Pleural R   Body fluid cell count with differential     Status: Abnormal   Collection  Time: 10/16/14  2:13 PM  Result Value Ref Range   Fluid Type-FCT PLEURAL     Comment: RIGHT CORRECTED ON 07/13 AT 1419: PREVIOUSLY REPORTED AS Pleural R    Color, Fluid YELLOW YELLOW   Appearance, Fluid HAZY (A) CLEAR   WBC, Fluid 418 0 - 1000 cu mm   Neutrophil Count, Fluid 15 0 - 25 %   Lymphs, Fluid 40 %   Monocyte-Macrophage-Serous Fluid 45 (L) 50 - 90 %   Eos, Fluid  0 %   Other Cells, Fluid CORRELATE WITH CYTOLOGY. %  Basic metabolic panel     Status: Abnormal   Collection Time: 10/17/14 10:42 AM  Result Value Ref Range   Sodium 130 (L) 135 - 145 mmol/L   Potassium 5.1 3.5 - 5.1 mmol/L   Chloride 95 (L) 101 - 111 mmol/L   CO2 26 22 - 32 mmol/L   Glucose, Bld 179 (H) 65 - 99 mg/dL   BUN 20 6 - 20 mg/dL   Creatinine, Ser 1.12 0.61 - 1.24 mg/dL   Calcium 8.8 (L) 8.9 - 10.3 mg/dL   GFR calc non Af Amer >60 >60 mL/min   GFR calc Af Amer >60 >60 mL/min    Comment: (NOTE) The eGFR has been calculated using the CKD EPI equation. This calculation has not been validated in all clinical situations. eGFR's persistently <60 mL/min signify possible Chronic Kidney Disease.    Anion gap 9 5 - 15  CBC with Differential/Platelet     Status: Abnormal   Collection Time: 10/17/14 10:42 AM  Result Value Ref Range   WBC 22.9 (H) 4.0 - 10.5 K/uL   RBC 4.30 4.22 - 5.81 MIL/uL   Hemoglobin 15.3 13.0 - 17.0 g/dL   HCT 46.1 39.0 - 52.0 %   MCV 107.2 (H) 78.0 - 100.0 fL   MCH 35.6 (H) 26.0 - 34.0 pg   MCHC 33.2 30.0 - 36.0 g/dL   RDW 14.0 11.5 - 15.5 %   Platelets 279 150 - 400 K/uL   Neutrophils Relative % 82 (H) 43 - 77 %   Neutro Abs 18.9 (H) 1.7 - 7.7 K/uL   Lymphocytes Relative 6 (L) 12 - 46 %   Lymphs Abs 1.4 0.7 - 4.0 K/uL   Monocytes Relative 11 3 - 12 %   Monocytes Absolute 2.6 (H) 0.1 - 1.0 K/uL   Eosinophils Relative 0 0 - 5 %   Eosinophils Absolute 0.0 0.0 - 0.7 K/uL   Basophils Relative 0 0 - 1 %   Basophils Absolute 0.0 0.0 - 0.1 K/uL  I-STAT 3, venous blood gas (G3P V)      Status: Abnormal   Collection Time: 10/17/14  1:53 PM  Result Value Ref Range   pH, Ven 7.312 (H) 7.250 - 7.300   pCO2, Ven 56.5 (H) 45.0 - 50.0 mmHg   pO2, Ven 40.0 30.0 - 45.0 mmHg   Bicarbonate 28.6 (H) 20.0 - 24.0 mEq/L   TCO2 30 0 - 100 mmol/L   O2 Saturation 68.0 %   Acid-Base Excess 1.0 0.0 - 2.0 mmol/L   Sample type VENOUS   I-STAT 3, arterial blood gas (G3+)     Status: Abnormal   Collection Time: 10/17/14  1:58 PM  Result Value Ref Range   pH, Arterial 7.364 7.350 - 7.450   pCO2 arterial 48.0 (H) 35.0 - 45.0 mmHg   pO2, Arterial 69.0 (L) 80.0 - 100.0 mmHg   Bicarbonate 27.4 (H) 20.0 - 24.0 mEq/L   TCO2 29 0 - 100 mmol/L   O2 Saturation 93.0 %   Acid-Base Excess 1.0 0.0 - 2.0 mmol/L   Sample type ARTERIAL   Basic metabolic panel     Status: Abnormal   Collection Time: 10/18/14  3:25 AM  Result Value Ref Range   Sodium 131 (L) 135 - 145 mmol/L   Potassium 5.1 3.5 - 5.1 mmol/L   Chloride 96 (L) 101 - 111 mmol/L   CO2 28 22 - 32 mmol/L  Glucose, Bld 131 (H) 65 - 99 mg/dL   BUN 22 (H) 6 - 20 mg/dL   Creatinine, Ser 1.13 0.61 - 1.24 mg/dL   Calcium 8.7 (L) 8.9 - 10.3 mg/dL   GFR calc non Af Amer >60 >60 mL/min   GFR calc Af Amer >60 >60 mL/min    Comment: (NOTE) The eGFR has been calculated using the CKD EPI equation. This calculation has not been validated in all clinical situations. eGFR's persistently <60 mL/min signify possible Chronic Kidney Disease.    Anion gap 7 5 - 15  CBC     Status: Abnormal   Collection Time: 10/18/14  3:25 AM  Result Value Ref Range   WBC 15.9 (H) 4.0 - 10.5 K/uL   RBC 3.84 (L) 4.22 - 5.81 MIL/uL   Hemoglobin 13.8 13.0 - 17.0 g/dL   HCT 41.7 39.0 - 52.0 %   MCV 108.6 (H) 78.0 - 100.0 fL   MCH 35.9 (H) 26.0 - 34.0 pg   MCHC 33.1 30.0 - 36.0 g/dL   RDW 14.0 11.5 - 15.5 %   Platelets 270 150 - 400 K/uL    No results found.  Review of Systems  Constitutional: Positive for malaise/fatigue. Negative for fever and chills.   Respiratory: Positive for cough, shortness of breath and wheezing.   Cardiovascular: Positive for claudication. Negative for chest pain, palpitations and leg swelling.  Gastrointestinal: Negative for nausea and blood in stool.  Genitourinary: Positive for urgency and frequency. Negative for hematuria.       Scrotal swelling  Musculoskeletal: Positive for back pain and neck pain.  Skin: Positive for itching.       "cellulitis" of legs  Endo/Heme/Allergies: Bruises/bleeds easily.  All other systems reviewed and are negative.  Blood pressure 140/76, pulse 91, temperature 98 F (36.7 C), temperature source Oral, resp. rate 17, height 5' 7"  (1.702 m), weight 135 lb 5.8 oz (61.4 kg), SpO2 98 %. Physical Exam  Vitals reviewed. Constitutional: He is oriented to person, place, and time.  Thin appears older than stated age  HENT:  Head: Normocephalic and atraumatic.  Eyes: Conjunctivae and EOM are normal. No scleral icterus.  Neck: Neck supple. No thyromegaly present.  No carotid bruits  Cardiovascular: Normal rate and regular rhythm.  Exam reveals gallop and S4.   No murmur heard. Pulses:      Radial pulses are 2+ on the right side, and 2+ on the left side.       Dorsalis pedis pulses are 0 on the right side, and 0 on the left side.       Posterior tibial pulses are 0 on the right side, and 0 on the left side.  Abnormal Allen's test on left  Respiratory: He has no wheezes. He has rales (faint).  Diminished breath sounds bilaterally  GI: Soft. There is no tenderness.  Musculoskeletal: He exhibits no edema.  Lymphadenopathy:    He has no cervical adenopathy.  Neurological: He is alert and oriented to person, place, and time. No cranial nerve deficit. Coordination normal.  No focal motor deficit  Skin: Skin is warm and dry.  Severe discoloration of both legs below knees with multiple open wounds   ECHOCARDIOGRAM Study Conclusions  - Left ventricle: The cavity size was normal. Wall  thickness was increased in a pattern of moderate to severe LVH. Systolic function was severely reduced. The estimated ejection fraction was in the range of 20% to 25%. Severe diffuse hypokinesis. Although no diagnostic regional wall  motion abnormality was identified, this possibility cannot be completely excluded on the basis of this study. Doppler parameters are consistent with abnormal left ventricular relaxation (grade 1 diastolic dysfunction). - Aortic valve: Trileaflet; normal thickness, mildly calcified leaflets. - Mitral valve: There was mild regurgitation. - Left atrium: The atrium was mildly to moderately dilated. - Pulmonary arteries: Systolic pressure was mildly increased. PA peak pressure: 35 mm Hg (S).   CARDIAC CATHETERIZATION  Prox RCA lesion, 100% stenosed.  Prox Cx lesion, 100% stenosed.  Dist LAD lesion, 100% stenosed.  2nd Diag lesion, 75% stenosed.  Prox LAD lesion, 60% stenosed.  There is severe left ventricular systolic dysfunction.  Severe LV dysfunction with an ejection fraction of approximately 25% with diffuse hypocontractility and more pronounced hypokinesis of the midinferior wall. There is at least 2+ and angiographic mitral regurgitation being more pronounced post ectopy beats.  Significant coronary calcification with severe multivessel CAD.  Proximal 60% LAD stenosis with a twin like LAD with 70% stenosis in the more superior diagonal like branch and total occlusion of the mid LAD. Other twin vessel after septal perforating artery with faint distal filling of this distal segment to the apex.  Normal large ramus intermediate vessel.  Total occlusion of the proximal left circumflex vessel.  Total occlusion of the proximal RCA with left to right collateralization to the distal RCA.  Hemo Data       Most Recent Value   Fick Cardiac Output  5.24 L/min   Fick Cardiac Output Index  3.08 (L/min)/BSA   Thermal Cardiac  Output  4.67 L/min   Thermal Cardiac Output Index  2.75 (L/min)/BSA   RA A Wave  5 mmHg   RA V Wave  4 mmHg   RA Mean  4 mmHg   RV Systolic Pressure  18 mmHg   RV Diastolic Pressure  4 mmHg   RV EDP  5 mmHg   PA Systolic Pressure  20 mmHg   PA Diastolic Pressure  12 mmHg   PA Mean  15 mmHg   PW A Wave  10 mmHg   PW V Wave  12 mmHg   PW Mean  12 mmHg   AO Systolic Pressure  92 mmHg   AO Diastolic Pressure  61 mmHg   AO Mean  74 mmHg   LV Systolic Pressure  88 mmHg   LV Diastolic Pressure  11 mmHg   LV EDP  16 mmHg   Arterial Occlusion Pressure Extended Systolic Pressure  79 mmHg   Arterial Occlusion Pressure Extended Diastolic Pressure  51 mmHg   Arterial Occlusion Pressure Extended Mean Pressure  63 mmHg   Left Ventricular Apex Extended Systolic Pressure  80 mmHg   Left Ventricular Apex Extended Diastolic Pressure  4 mmHg   Left Ventricular Apex Extended EDP Pressure  14 mmHg   TPVR Index  5.83 HRUI   TSVR Index  26.94 HRUI   PVR SVR Ratio  0.06   TPVR/TSVR Ratio  0.22   PFTs  FVC= 2.22 pre- 2.45(59%) postbronchodilator FEV1 0.96 pre- 1.06(34%) FEV1/FVC= 0.57  Assessment/Plan: 64 yo man with a history of tobacco abuse, severe COPD, hypertension and severe PAD presents with acute on chronic respiratory failure due to acute systolic and diastolic left heart failure. He has severe 3 vessel CAD with impaired LV function. There was 2+ MR at cath but only mild by echo and no significant murmur, so I suspect the MR was due to PVC +/- catheter entangled with the subvalvular apparatus.  Revascularization is indicated for survival benefit and relief of symptoms.   CABG is high risk due to severe LV dysfunction and severe PAD and COPD. Conduit availability is limited due to PAD. The left radial is not usable and we cannot go below the knee on either leg. He has severe LV dysfunction but PA pressures and right heart hemodynamics are OK.  I discussed the  option of CABG with the patient. I reviewed the general nature of the procedure, the need for general anesthesia, the use of cardiopulmonary bypass and the incisions to be used. We discussed the expected hospital stay, overall recovery and short and long term outcomes. He understands the risks include, but are not limited to death, stroke, MI, DVT/PE, bleeding, possible need for transfusion, infections, cardiac arrhythmias, leg ischemia and other organ system dysfunction including respiratory, renal, or GI complications.   He accepts the risks and agrees to proceed.  He has improved clinically since admission but needs continued medical optimization before surgery.  Tentatively plan CABG on Wednesday 10/23/2014  Melrose Nakayama 10/18/2014, 1:40 PM

## 2014-10-18 NOTE — Progress Notes (Signed)
Revised PT Evaluation including assessment for 10/16/14    10/16/14 1250  PT - End of Session  Activity Tolerance Patient limited by pain  Patient left in bed;with call bell/phone within reach  Nurse Communication Mobility status  PT Assessment  PT Therapy Diagnosis  Generalized weakness;Acute pain;Difficulty walking  PT Recommendation/Assessment Patient needs continued PT services  PT Problem List Decreased activity tolerance;Decreased mobility  PT Plan  PT Frequency (ACUTE ONLY) Min 3X/week  PT Treatment/Interventions (ACUTE ONLY) Gait training;Functional mobility training;Therapeutic activities;Therapeutic exercise;Balance training;Neuromuscular re-education;Patient/family education  PT Recommendation  Recommendations for Other Services OT consult;Rehab consult  Follow Up Recommendations CIR;Supervision/Assistance - 24 hour  PT equipment Other (comment) (TBD)  Individuals Consulted  Consulted and Agree with Results and Recommendations Patient  Travis Romero PT 10/18/2014  [phone]

## 2014-10-18 NOTE — Progress Notes (Signed)
[facility] PROGRESS NOTE  Abanoub Hanken YIR:[id number] DOB: 04-21-1950 DOA: 10/11/2014 PCP: Orpah Melter, MD  Assessment/Plan: 1-acute hypoxic/hypercapnic respiratory failure secondary to acute pulmonary edema and acute on chronic combined CHF exacerbation -Patient breathing better and easier -Extubated on 10/14/14 -Blood pressure, electrolytes and renal function tolerating current diuresis -Patient ejection fraction 20-25% -Will continue Lasix by mouth twice a day, continue carvedilol, hydralazine, entresto and spironolactone -Will continue aggressive lungs protocol hygiene, continue scheduled bronchodilators and the use of incentive spirometry and flutter valve -Cardiology is on before and helping managing patient's heart failure; will follow rec's -Catheterization (right and left heart cath) done on 7/14 has demonstrated CVR 3 vessels coronary artery disease. Patient has now been seen by cardiothoracic surgeon and that is anticipated tentative CABG 7/20.  -Continue slowly weaning oxygen supplementation as tolerated.  2-acute kidney injury: On ensure of patient baseline renal function. Most likely associated with decreased perfusion with acute on chronic CHF exacerbation. -Creatinine now within normal limits and a GFR suggesting a stage II -Will monitor his renal function especially after catheterization done 7/14  3-leukocytosis: Patient without fever and nontoxic. Unclear etiology -Will follow WBCs trend (trending down) -no fever -will hold on antibiotics  4-anxiety: Continue when necessary Xanax  5-physical deconditioning: PT/OT on board -anticipating SNF after CABG for rehabilitation  6-BPH with urinary retention: Will place Foley catheter -Continue the use of avodart  7-constipation: Will continue the use of Colace and miralax  8-depression: Continue Remeron  9-hyperlipidemia: continue Lipitor  10-bilateral pleural effusion, status post right thoracentesis  with significant improvement on patient's breathing and air movement. -We'll repeat x-ray taking 1-2 days and if needed performed left thoracentesis  Code Status: Full Family Communication: no family at bedside Disposition Plan: Tentatively CABG on 7/20; anticipating skilled nursing facility after discharge for rehabilitation   Consultants: Cardiology PCCM CVTS  Antibiotics:  None   HPI/Subjective: Denies chest pain, abdominal pain. Patient afebrile and endorses a stable breathing. He was complaining of constipation and also found to have more than 1 L of fluid on a bladder scan   Objective: Filed Vitals:   10/18/14 1403  BP: 137/76  Pulse: 109  Temp: 98.1 F (36.7 C)  Resp: 18    Intake/Output Summary (Last 24 hours) at 10/18/14 1508 Last data filed at 10/18/14 1449  Gross per 24 hour  Intake    420 ml  Output    102 ml  Net    318 ml   Filed Weights   10/16/14 0500 10/17/14 0434 10/18/14 0459  Weight: 59.8 kg (131 lb 13.4 oz) 59.6 kg (131 lb 6.3 oz) 61.4 kg (135 lb 5.8 oz)    Exam:   General:  Frail, chronically ill in appearance; afebrile. Reports no chest pain currently and stable breathing.  Cardiovascular: S1 and S2, no rubs, no gallops; mild JVD appreciated on exam  Respiratory: No frank crackles, scattered rhonchi, no wheezing; decreased breath sounds at bases (left more than right)  Abdomen: Positive bowel sounds, no distention, no guarding  Musculoskeletal: Trace edema bilaterally, no cyanosis or clubbing.  Data Reviewed: Basic Metabolic Panel:  Recent Labs Lab 10/12/14 0351  10/13/14 0359  10/14/14 0405 10/15/14 0335 10/16/14 0342 10/17/14 1042 10/18/14 0325  NA 133*  < > 132*  < > 133* 132* 133* 130* 131*  K 5.1  < > 4.5  < > 4.0 3.6 4.0 5.1 5.1  CL 98*  < > 97*  < > 94* 94* 96* 95* 96*  CO2 25  < >  27  < > 30 31 28 26 28   GLUCOSE 110*  < > 110*  < > 92 141* 153* 179* 131*  BUN 23*  < > 23*  < > 19 18 17 20  22*  CREATININE 1.30*  <  > 1.72*  < > 1.59* 1.32* 0.95 1.12 1.13  CALCIUM 8.9  < > 8.4*  < > 8.4* 7.8* 8.5* 8.8* 8.7*  MG 2.1  --  1.9  --  1.9 1.7  --   --   --   PHOS 4.7*  --  4.5  --  3.9 3.5  --   --   --   < > = values in this interval not displayed. Liver Function Tests:  Recent Labs Lab 10/11/14 1959 10/13/14 1736 10/16/14 0342  AST 29 13*  --   ALT 26 15*  --   ALKPHOS 90 60  --   BILITOT 0.6 0.4  --   PROT 7.1 5.2* 5.8*  ALBUMIN 3.2* 2.4*  --     Recent Labs Lab 10/11/14 1958  AMMONIA 23   CBC:  Recent Labs Lab 10/11/14 1959 10/12/14 0427 10/14/14 0405 10/15/14 0335 10/17/14 1042 10/18/14 0325  WBC 10.2 13.4* 10.2 10.0 22.9* 15.9*  NEUTROABS 7.5  --  7.0  --  18.9*  --   HGB 15.7 14.1 13.3 12.7* 15.3 13.8  HCT 47.4 42.6 39.6 38.2* 46.1 41.7  MCV 109.7* 110.4* 109.1* 108.5* 107.2* 108.6*  PLT 391 330 289 283 279 270   Cardiac Enzymes:  Recent Labs Lab 10/11/14 1959 10/12/14 0106 10/12/14 0710 10/12/14 1337  TROPONINI 0.44* 0.35* 0.42* 0.34*   BNP (last 3 results)  Recent Labs  10/11/14 1959 10/12/14 1337  BNP 1073.2* 981.7*   CBG: No results for input(s): GLUCAP in the last 168 hours.  Recent Results (from the past 240 hour(s))  MRSA PCR Screening     Status: None   Collection Time: 10/12/14  5:29 AM  Result Value Ref Range Status   MRSA by PCR NEGATIVE NEGATIVE Final    Comment:        The GeneXpert MRSA Assay (FDA approved for NASAL specimens only), is one component of a comprehensive MRSA colonization surveillance program. It is not intended to diagnose MRSA infection nor to guide or monitor treatment for MRSA infections.   Culture, body fluid-bottle     Status: None (Preliminary result)   Collection Time: 10/16/14  2:10 PM  Result Value Ref Range Status   Specimen Description FLUID PLEURAL  Final   Special Requests BOTTLES DRAWN AEROBIC ONLY 5CC  Final   Culture   Final    NO GROWTH 2 DAYS Performed at [facility]    Report Status  PENDING  Incomplete  Gram stain     Status: None (Preliminary result)   Collection Time: 10/16/14  2:10 PM  Result Value Ref Range Status   Specimen Description FLUID PLEURAL  Final   Special Requests BOTTLES DRAWN AEROBIC ONLY 5CC  Final   Gram Stain   Final    WBC PRESENT,BOTH PMN AND MONONUCLEAR NO ORGANISMS SEEN CYTOSPIN SLIDE Performed at [facility]    Report Status PENDING  Incomplete     Studies: No results found.  Scheduled Meds: . aspirin  81 mg Oral Daily  . atorvastatin  80 mg Oral q1800  . carvedilol  6.25 mg Oral BID WC  . cloNIDine  0.2 mg Transdermal Weekly  . docusate sodium  50 mg  Oral BID  . dutasteride  0.5 mg Oral Daily  . furosemide  40 mg Oral BID  . heparin  5,000 Units Subcutaneous 3 times per day  . heparin  5,000 Units Subcutaneous 3 times per day  . hydrALAZINE  25 mg Oral 3 times per day  . mirtazapine  45 mg Oral QHS  . polyethylene glycol  17 g Oral Daily  . sacubitril-valsartan  1 tablet Oral BID  . sodium chloride  3 mL Intravenous Q12H  . spironolactone  75 mg Oral Daily   Continuous Infusions:    Active Problems:   CHF exacerbation   Acute respiratory failure with hypoxia   Acute combined systolic and diastolic CHF, NYHA class 4   Acute pulmonary edema   Endotracheal tube present   NSTEMI (non-ST elevated myocardial infarction)   Acute on chronic combined systolic and diastolic CHF (congestive heart failure)   Shortness of breath   Adjustment disorder with anxious mood   Severe peripheral arterial disease    Time spent: 35 minutes     Barton Dubois  [facility] Pager [phone]. If 7PM-7AM, please contact night-coverage at [email or web address], password [facility] 10/18/2014, 3:08 PM  LOS: 6 days

## 2014-10-19 DIAGNOSIS — I739 Peripheral vascular disease, unspecified: Secondary | ICD-10-CM

## 2014-10-19 DIAGNOSIS — I214 Non-ST elevation (NSTEMI) myocardial infarction: Secondary | ICD-10-CM

## 2014-10-19 DIAGNOSIS — I2510 Atherosclerotic heart disease of native coronary artery without angina pectoris: Secondary | ICD-10-CM | POA: Insufficient documentation

## 2014-10-19 DIAGNOSIS — R0602 Shortness of breath: Secondary | ICD-10-CM | POA: Insufficient documentation

## 2014-10-19 DIAGNOSIS — Z720 Tobacco use: Secondary | ICD-10-CM | POA: Insufficient documentation

## 2014-10-19 DIAGNOSIS — E785 Hyperlipidemia, unspecified: Secondary | ICD-10-CM | POA: Insufficient documentation

## 2014-10-19 LAB — BASIC METABOLIC PANEL
ANION GAP: 7 (ref 5–15)
BUN: 28 mg/dL — ABNORMAL HIGH (ref 6–20)
CALCIUM: 8.8 mg/dL — AB (ref 8.9–10.3)
CHLORIDE: 97 mmol/L — AB (ref 101–111)
CO2: 29 mmol/L (ref 22–32)
Creatinine, Ser: 1.27 mg/dL — ABNORMAL HIGH (ref 0.61–1.24)
GFR calc Af Amer: >60 mL/min (ref >60)
GFR calc non Af Amer: 58 mL/min — ABNORMAL LOW (ref >60)
Glucose, Bld: 136 mg/dL — ABNORMAL HIGH (ref 65–99)
Potassium: 4.8 mmol/L (ref 3.5–5.1)
SODIUM: 133 mmol/L — AB (ref 135–145)

## 2014-10-19 MED ORDER — NICOTINE 21 MG/24HR TD PT24
21.0000 mg | MEDICATED_PATCH | Freq: Every day | TRANSDERMAL | Status: DC
  Administered 2014-10-19 – 2014-10-22 (×4): 21 mg via TRANSDERMAL
  Filled 2014-10-19 (×5): qty 1

## 2014-10-19 NOTE — Progress Notes (Signed)
Nurse next door in another patient's room when fall occurred and heard a big thump. Ran to patient's room and saw patient on the floor leaning to the left side. .Asked patient to remain seated. Rang call bell to ask for assistance and the charge nurse. When asked, patient stated he was trying to get up to get his check book from his brief case. Noticed patient had 3 cigarettes crumpled in his right hand. Once the charge nurse arrived, cigarettes given to charge nurse and they also found 2 boxes of cigarettes in brief case. At the moment patient asked if he could get a nicotine patch.   Patient was able to state Name and birthdate, location and situation. Patient also able to give date. Patient stated no when asked if he hit his head, and any other bony prominences such as elbow, knees, heels,feet, and ankles.  Patient states when asked if he wanted family members to know about falls, Patient firmly states No.   Head to toe Post-fall assessment done on the patient. Head was intact. Upper extremities and trunk area free from any cuts, skin tears, or abrasions. Right leg free of any cuts, skin tears or abrasions. Left leg has small skin tear that appears to initially been a small open area from a previous open area from cellulitis that healed but due to fall has cause it to have small amount of bleeding noted. Foam dressing applied. VSS Patient was transferred with gait belt and and four staff members to assist with getting patient off the floor and back to bed. Patient states he was fine and when asked if he had any pain from the the fall patient stated No.  No equipment was in was involved in fall.  Notified Dr. Fredirick Maudlin, MD of incident of fall and patient's request for a Nicotine patch. Prior to leaving room, Fall risk bracelet on, yellow socks on, and bed alarm is set. Patient is in bed and comfortable. Will continue to monitor patient to end of shift.

## 2014-10-19 NOTE — Progress Notes (Addendum)
DAILY PROGRESS NOTE  Subjective:  Events noted overnight. Patient apparently fell out of bed. He was found to have 3 cigarettes couple did in his right hand. I reviewed his cardiac catheterization which indicates multivessel coronary disease. There is been a long discussion with Dr. Roxan Hockey regarding revascularization, which is tentatively scheduled for next Wednesday. He understands that he cannot continue to smoke which would significantly reduce his chance of healing and the long-term patency of his grafts. He's currently about -16 L negative since admission.  Objective:  Temp:  [97.6 F (36.4 C)-98.4 F (36.9 C)] 97.6 F (36.4 C) (07/16 0605) Pulse Rate:  [88-109] 98 (07/16 0605) Resp:  [5-20] 18 (07/16 0605) BP: (114-140)/(64-78) 128/74 mmHg (07/16 0605) SpO2:  [93 %-98 %] 98 % (07/16 0605) Weight:  [134 lb 3 oz (60.868 kg)] 134 lb 3 oz (60.868 kg) (07/16 0605) Weight change: -1 lb 2.8 oz (-0.532 kg)  Intake/Output from previous day: 07/15 0701 - 07/16 0700 In: 763 [P.O.:760; I.V.:3] Out: 2902 [Urine:2900; Stool:2]  Intake/Output from this shift: Total I/O In: 360 [P.O.:360] Out: 200 [Urine:200]  Medications: Current Facility-Administered Medications  Medication Dose Route Frequency Provider Last Rate Last Dose  . 0.9 %  sodium chloride infusion  250 mL Intravenous PRN Luz Brazen, MD      . 0.9 %  sodium chloride infusion  250 mL Intravenous PRN Troy Sine, MD      . acetaminophen (TYLENOL) tablet 650 mg  650 mg Oral Q4H PRN Troy Sine, MD      . ALPRAZolam Duanne Moron) tablet 0.25 mg  0.25 mg Oral TID PRN Erick Colace, NP   0.25 mg at 10/18/14 2328  . aspirin chewable tablet 81 mg  81 mg Oral Daily Troy Sine, MD   81 mg at 10/18/14 1825  . atorvastatin (LIPITOR) tablet 80 mg  80 mg Oral q1800 Troy Sine, MD   80 mg at 10/18/14 1747  . bisacodyl (DULCOLAX) suppository 10 mg  10 mg Rectal Daily PRN Barton Dubois, MD      . carvedilol (COREG)  tablet 6.25 mg  6.25 mg Oral BID WC Barton Dubois, MD   6.25 mg at 10/18/14 1746  . cloNIDine (CATAPRES - Dosed in mg/24 hr) patch 0.2 mg  0.2 mg Transdermal Weekly Jacolyn Reedy, MD   0.2 mg at 10/14/14 1135  . docusate sodium (COLACE) capsule 50 mg  50 mg Oral BID Barton Dubois, MD   50 mg at 10/18/14 2325  . dutasteride (AVODART) capsule 0.5 mg  0.5 mg Oral Daily Erick Colace, NP   0.5 mg at 10/18/14 1817  . furosemide (LASIX) tablet 40 mg  40 mg Oral BID Barton Dubois, MD   40 mg at 10/18/14 1747  . heparin injection 5,000 Units  5,000 Units Subcutaneous 3 times per day Troy Sine, MD   5,000 Units at 10/19/14 0712  . hydrALAZINE (APRESOLINE) tablet 25 mg  25 mg Oral 3 times per day Jacolyn Reedy, MD   25 mg at 10/19/14 0617  . HYDROcodone-acetaminophen (NORCO) 7.5-325 MG per tablet 1 tablet  1 tablet Oral Q6H PRN Laverle Hobby, MD   1 tablet at 10/18/14 2328  . ipratropium-albuterol (DUONEB) 0.5-2.5 (3) MG/3ML nebulizer solution 3 mL  3 mL Nebulization Q2H PRN Chesley Mires, MD      . mirtazapine (REMERON) tablet 45 mg  45 mg Oral QHS Kara Mead V, MD   45 mg at 10/18/14  2327  . nicotine (NICODERM CQ - dosed in mg/24 hours) patch 21 mg  21 mg Transdermal Daily Dianne Dun, NP      . ondansetron [facility]) injection 4 mg  4 mg Intravenous Q6H PRN Troy Sine, MD      . polyethylene glycol (MIRALAX / GLYCOLAX) packet 17 g  17 g Oral Daily Barton Dubois, MD   17 g at 10/18/14 1602  . sacubitril-valsartan (ENTRESTO) 24-26 mg per tablet  1 tablet Oral BID Jacolyn Reedy, MD   1 tablet at 10/18/14 2327  . sodium chloride 0.9 % injection 3 mL  3 mL Intravenous Q12H Troy Sine, MD   3 mL at 10/18/14 2327  . sodium chloride 0.9 % injection 3 mL  3 mL Intravenous PRN Troy Sine, MD      . spironolactone (ALDACTONE) tablet 75 mg  75 mg Oral Daily Erick Colace, NP   75 mg at 10/18/14 1816    Physical Exam: General appearance: alert, no distress and Mildly  labored breathing Neck: no carotid bruit and no JVD Lungs: diminished breath sounds bilaterally Heart: Regular tachycardia Abdomen: soft, non-tender; bowel sounds normal; no masses,  no organomegaly Extremities: extremities normal, atraumatic, no cyanosis or edema and venous stasis dermatitis noted Pulses: 2+ and symmetric Skin: Chronic lower extremity stasis edema Neurologic: Grossly normal  Lab Results: Results for orders placed or performed during the hospital encounter of 10/11/14 (from the past 48 hour(s))  I-STAT 3, venous blood gas (G3P V)     Status: Abnormal   Collection Time: 10/17/14  1:53 PM  Result Value Ref Range   pH, Ven 7.312 (H) 7.250 - 7.300   pCO2, Ven 56.5 (H) 45.0 - 50.0 mmHg   pO2, Ven 40.0 30.0 - 45.0 mmHg   Bicarbonate 28.6 (H) 20.0 - 24.0 mEq/L   TCO2 30 0 - 100 mmol/L   O2 Saturation 68.0 %   Acid-Base Excess 1.0 0.0 - 2.0 mmol/L   Sample type VENOUS   I-STAT 3, arterial blood gas (G3+)     Status: Abnormal   Collection Time: 10/17/14  1:58 PM  Result Value Ref Range   pH, Arterial 7.364 7.350 - 7.450   pCO2 arterial 48.0 (H) 35.0 - 45.0 mmHg   pO2, Arterial 69.0 (L) 80.0 - 100.0 mmHg   Bicarbonate 27.4 (H) 20.0 - 24.0 mEq/L   TCO2 29 0 - 100 mmol/L   O2 Saturation 93.0 %   Acid-Base Excess 1.0 0.0 - 2.0 mmol/L   Sample type ARTERIAL   Basic metabolic panel     Status: Abnormal   Collection Time: 10/18/14  3:25 AM  Result Value Ref Range   Sodium 131 (L) 135 - 145 mmol/L   Potassium 5.1 3.5 - 5.1 mmol/L   Chloride 96 (L) 101 - 111 mmol/L   CO2 28 22 - 32 mmol/L   Glucose, Bld 131 (H) 65 - 99 mg/dL   BUN 22 (H) 6 - 20 mg/dL   Creatinine, Ser 1.13 0.61 - 1.24 mg/dL   Calcium 8.7 (L) 8.9 - 10.3 mg/dL   GFR calc non Af Amer >60 >60 mL/min   GFR calc Af Amer >60 >60 mL/min    Comment: (NOTE) The eGFR has been calculated using the CKD EPI equation. This calculation has not been validated in all clinical situations. eGFR's persistently <60 mL/min  signify possible Chronic Kidney Disease.    Anion gap 7 5 - 15  CBC  Status: Abnormal   Collection Time: 10/18/14  3:25 AM  Result Value Ref Range   WBC 15.9 (H) 4.0 - 10.5 K/uL   RBC 3.84 (L) 4.22 - 5.81 MIL/uL   Hemoglobin 13.8 13.0 - 17.0 g/dL   HCT 41.7 39.0 - 52.0 %   MCV 108.6 (H) 78.0 - 100.0 fL   MCH 35.9 (H) 26.0 - 34.0 pg   MCHC 33.1 30.0 - 36.0 g/dL   RDW 14.0 11.5 - 15.5 %   Platelets 270 150 - 400 K/uL  Basic metabolic panel     Status: Abnormal   Collection Time: 10/19/14  3:30 AM  Result Value Ref Range   Sodium 133 (L) 135 - 145 mmol/L   Potassium 4.8 3.5 - 5.1 mmol/L   Chloride 97 (L) 101 - 111 mmol/L   CO2 29 22 - 32 mmol/L   Glucose, Bld 136 (H) 65 - 99 mg/dL   BUN 28 (H) 6 - 20 mg/dL   Creatinine, Ser 1.27 (H) 0.61 - 1.24 mg/dL   Calcium 8.8 (L) 8.9 - 10.3 mg/dL   GFR calc non Af Amer 58 (L) >60 mL/min   GFR calc Af Amer >60 >60 mL/min    Comment: (NOTE) The eGFR has been calculated using the CKD EPI equation. This calculation has not been validated in all clinical situations. eGFR's persistently <60 mL/min signify possible Chronic Kidney Disease.    Anion gap 7 5 - 15    Imaging: No results found.  Assessment:  1. Active Problems: 2.   CHF exacerbation 3.   Acute respiratory failure with hypoxia 4.   Acute combined systolic and diastolic CHF, NYHA class 4 5.   Acute pulmonary edema 6.   Endotracheal tube present 7.   NSTEMI (non-ST elevated myocardial infarction) 8.   Acute on chronic combined systolic and diastolic CHF (congestive heart failure) 9.   Shortness of breath 10.   Adjustment disorder with anxious mood 11.   Severe peripheral arterial disease 12.   Plan:  1. Seems to be close to euvolemic on exam, now down 16 L negative. Still with some mild shortness of breath which is likely related to pulmonary disease. Is requesting nicotine patches which we will provide as he was found to have cigarettes in the hospital last evening. I  will continue to optimize medications for heart failure. Creatinine rising slightly today - on lasix 40 mg BID. Re-evaluate tomorrow, may need to decrease to 40 mg daily. Plan for bypass surgery tentatively on Wednesday of next week.  Time Spent Directly with Patient:  15 minutes  Length of Stay:  LOS: 7 days   Pixie Casino, MD, [facility] Attending Cardiologist [place] 10/19/2014, 10:52 AM

## 2014-10-19 NOTE — Progress Notes (Signed)
[facility] PROGRESS NOTE  Travis Romero HUD:[id number] DOB: Nov 04, 1950 DOA: 10/11/2014 PCP: Orpah Melter, MD  Assessment/Plan: 1-acute hypoxic/hypercapnic respiratory failure secondary to acute pulmonary edema and acute on chronic combined CHF exacerbation -Patient breathing better and easier -Extubated on 10/14/14 -Blood pressure, electrolytes and renal function tolerating current diuresis -Patient ejection fraction 20-25% -Will continue Lasix by mouth twice a day, continue carvedilol, hydralazine, entresto and spironolactone -Will continue aggressive lungs protocol hygiene, continue scheduled bronchodilators and the use of incentive spirometry and flutter valve -Cardiology is on before and helping managing patient's heart failure; will follow rec's -Catheterization (right and left heart cath) done on 7/14 has demonstrated CVR 3 vessels coronary artery disease. Patient has now been seen by cardiothoracic surgeon and that is anticipated tentative CABG 7/20.  -Continue slowly weaning oxygen supplementation as tolerated.  2-acute kidney injury: On ensure of patient baseline renal function. Most likely associated with decreased perfusion with acute on chronic CHF exacerbation. -Creatinine now within normal limits and a GFR suggesting a stage II-III -Cr bump a little to 1.2, might need adjustment on his diuretics -will follow  3-leukocytosis: Patient without fever and nontoxic. Unclear etiology -Will follow WBCs trend (continue trending down) -no fever -will hold on antibiotics  4-anxiety: Continue when necessary Xanax  5-physical deconditioning: PT/OT on board -anticipating SNF after CABG for rehabilitation  6-BPH with urinary retention: Foley catheter placed on 7/15 -Continue the use of avodart -voiding trials in 1-2 days  7-constipation: Will continue the use of Colace and miralax  8-depression: Continue Remeron  9-hyperlipidemia: continue Lipitor  10-bilateral  pleural effusion, status post right thoracentesis with significant improvement on patient's breathing and air movement. -Wll repeat x-ray tomorrow to follow on left pleural effusion and decide on performing or not left thoracentesis base on results   11-tobacco abuse: will use nicotine patch -cessation counseling provided   Code Status: Full Family Communication: no family at bedside Disposition Plan: Tentatively CABG on 7/20; anticipating skilled nursing facility after discharge for rehabilitation  Consultants: Cardiology PCCM CVTS  Antibiotics:  None   HPI/Subjective: Denies chest pain, abdominal pain. Patient afebrile and endorses a stable breathing. He was found with cigarettes in the bathroom last night. Patient is 16L neg approx since admission.   Objective: Filed Vitals:   10/19/14 1300  BP: 144/72  Pulse: 89  Temp: 97.6 F (36.4 C)  Resp: 18    Intake/Output Summary (Last 24 hours) at 10/19/14 1544 Last data filed at 10/19/14 1454  Gross per 24 hour  Intake   1263 ml  Output   3550 ml  Net  -2287 ml   Filed Weights   10/17/14 0434 10/18/14 0459 10/19/14 0605  Weight: 59.6 kg (131 lb 6.3 oz) 61.4 kg (135 lb 5.8 oz) 60.868 kg (134 lb 3 oz)    Exam:   General:  Frail, chronically ill in appearance; afebrile. Reports no chest pain currently and stable breathing. Found with cigarettes in the bathroom last night  Cardiovascular: S1 and S2, no rubs, no gallops; mild JVD appreciated on exam  Respiratory: No frank crackles, scattered rhonchi, no wheezing; decreased breath sounds at bases (left more than right)  Abdomen: Positive bowel sounds, no distention, no guarding  Musculoskeletal: Trace edema bilaterally, no cyanosis or clubbing.  Data Reviewed: Basic Metabolic Panel:  Recent Labs Lab 10/13/14 0359  10/14/14 0405 10/15/14 0335 10/16/14 0342 10/17/14 1042 10/18/14 0325 10/19/14 0330  NA 132*  < > 133* 132* 133* 130* 131* 133*  K 4.5  < >  4.0  3.6 4.0 5.1 5.1 4.8  CL 97*  < > 94* 94* 96* 95* 96* 97*  CO2 27  < > 30 31 28 26 28 29   GLUCOSE 110*  < > 92 141* 153* 179* 131* 136*  BUN 23*  < > 19 18 17 20  22* 28*  CREATININE 1.72*  < > 1.59* 1.32* 0.95 1.12 1.13 1.27*  CALCIUM 8.4*  < > 8.4* 7.8* 8.5* 8.8* 8.7* 8.8*  MG 1.9  --  1.9 1.7  --   --   --   --   PHOS 4.5  --  3.9 3.5  --   --   --   --   < > = values in this interval not displayed. Liver Function Tests:  Recent Labs Lab 10/13/14 1736 10/16/14 0342  AST 13*  --   ALT 15*  --   ALKPHOS 60  --   BILITOT 0.4  --   PROT 5.2* 5.8*  ALBUMIN 2.4*  --    CBC:  Recent Labs Lab 10/14/14 0405 10/15/14 0335 10/17/14 1042 10/18/14 0325  WBC 10.2 10.0 22.9* 15.9*  NEUTROABS 7.0  --  18.9*  --   HGB 13.3 12.7* 15.3 13.8  HCT 39.6 38.2* 46.1 41.7  MCV 109.1* 108.5* 107.2* 108.6*  PLT 289 283 279 270   Cardiac Enzymes: No results for input(s): CKTOTAL, CKMB, CKMBINDEX, TROPONINI in the last 168 hours. BNP (last 3 results)  Recent Labs  10/11/14 1959 10/12/14 1337  BNP 1073.2* 981.7*    Recent Results (from the past 240 hour(s))  MRSA PCR Screening     Status: None   Collection Time: 10/12/14  5:29 AM  Result Value Ref Range Status   MRSA by PCR NEGATIVE NEGATIVE Final    Comment:        The GeneXpert MRSA Assay (FDA approved for NASAL specimens only), is one component of a comprehensive MRSA colonization surveillance program. It is not intended to diagnose MRSA infection nor to guide or monitor treatment for MRSA infections.   Culture, body fluid-bottle     Status: None (Preliminary result)   Collection Time: 10/16/14  2:10 PM  Result Value Ref Range Status   Specimen Description FLUID PLEURAL  Final   Special Requests BOTTLES DRAWN AEROBIC ONLY 5CC  Final   Culture   Final    NO GROWTH 3 DAYS Performed at [facility]    Report Status PENDING  Incomplete  Gram stain     Status: None (Preliminary result)   Collection Time: 10/16/14   2:10 PM  Result Value Ref Range Status   Specimen Description FLUID PLEURAL  Final   Special Requests BOTTLES DRAWN AEROBIC ONLY 5CC  Final   Gram Stain   Final    WBC PRESENT,BOTH PMN AND MONONUCLEAR NO ORGANISMS SEEN CYTOSPIN SLIDE Performed at [facility]    Report Status PENDING  Incomplete     Studies: No results found.  Scheduled Meds: . aspirin  81 mg Oral Daily  . atorvastatin  80 mg Oral q1800  . carvedilol  6.25 mg Oral BID WC  . cloNIDine  0.2 mg Transdermal Weekly  . docusate sodium  50 mg Oral BID  . dutasteride  0.5 mg Oral Daily  . furosemide  40 mg Oral BID  . heparin  5,000 Units Subcutaneous 3 times per day  . hydrALAZINE  25 mg Oral 3 times per day  . mirtazapine  45 mg Oral QHS  .  nicotine  21 mg Transdermal Daily  . polyethylene glycol  17 g Oral Daily  . sacubitril-valsartan  1 tablet Oral BID  . sodium chloride  3 mL Intravenous Q12H  . spironolactone  75 mg Oral Daily   Continuous Infusions:    Active Problems:   CHF exacerbation   Acute respiratory failure with hypoxia   Acute combined systolic and diastolic CHF, NYHA class 4   Acute pulmonary edema   Endotracheal tube present   NSTEMI (non-ST elevated myocardial infarction)   Acute on chronic combined systolic and diastolic CHF (congestive heart failure)   Shortness of breath   Adjustment disorder with anxious mood   Severe peripheral arterial disease    Time spent: 35 minutes    Barton Dubois  [facility] Pager [phone]. If 7PM-7AM, please contact night-coverage at [email or web address], password [facility] 10/19/2014, 3:44 PM  LOS: 7 days

## 2014-10-20 ENCOUNTER — Inpatient Hospital Stay (HOSPITAL_COMMUNITY): Payer: PRIVATE HEALTH INSURANCE

## 2014-10-20 DIAGNOSIS — Z720 Tobacco use: Secondary | ICD-10-CM

## 2014-10-20 DIAGNOSIS — J90 Pleural effusion, not elsewhere classified: Secondary | ICD-10-CM | POA: Insufficient documentation

## 2014-10-20 DIAGNOSIS — R338 Other retention of urine: Secondary | ICD-10-CM

## 2014-10-20 DIAGNOSIS — F4322 Adjustment disorder with anxiety: Secondary | ICD-10-CM

## 2014-10-20 DIAGNOSIS — N401 Enlarged prostate with lower urinary tract symptoms: Secondary | ICD-10-CM | POA: Insufficient documentation

## 2014-10-20 DIAGNOSIS — N400 Enlarged prostate without lower urinary tract symptoms: Secondary | ICD-10-CM

## 2014-10-20 MED ORDER — CAMPHOR-MENTHOL 0.5-0.5 % EX LOTN
TOPICAL_LOTION | Freq: Three times a day (TID) | CUTANEOUS | Status: DC | PRN
  Filled 2014-10-20: qty 222

## 2014-10-20 MED ORDER — IPRATROPIUM-ALBUTEROL 0.5-2.5 (3) MG/3ML IN SOLN
3.0000 mL | Freq: Four times a day (QID) | RESPIRATORY_TRACT | Status: DC
  Administered 2014-10-20 – 2014-10-22 (×9): 3 mL via RESPIRATORY_TRACT
  Filled 2014-10-20 (×11): qty 3

## 2014-10-20 MED ORDER — HYDROCODONE-ACETAMINOPHEN 10-325 MG PO TABS
1.0000 | ORAL_TABLET | ORAL | Status: DC | PRN
  Administered 2014-10-20 – 2014-10-22 (×13): 1 via ORAL
  Filled 2014-10-20 (×15): qty 1

## 2014-10-20 MED ORDER — FUROSEMIDE 40 MG PO TABS
40.0000 mg | ORAL_TABLET | Freq: Every day | ORAL | Status: DC
  Administered 2014-10-21: 40 mg via ORAL
  Filled 2014-10-20: qty 1

## 2014-10-20 MED ORDER — ALPRAZOLAM 0.5 MG PO TABS
0.5000 mg | ORAL_TABLET | Freq: Three times a day (TID) | ORAL | Status: DC | PRN
  Administered 2014-10-20 – 2014-10-22 (×8): 0.5 mg via ORAL
  Filled 2014-10-20 (×10): qty 1

## 2014-10-20 MED ORDER — TAMSULOSIN HCL 0.4 MG PO CAPS
0.4000 mg | ORAL_CAPSULE | Freq: Every day | ORAL | Status: DC
  Administered 2014-10-20 – 2014-10-22 (×3): 0.4 mg via ORAL
  Filled 2014-10-20 (×4): qty 1

## 2014-10-20 MED ORDER — HYDROCERIN EX CREA
TOPICAL_CREAM | Freq: Three times a day (TID) | CUTANEOUS | Status: DC
  Administered 2014-10-20 – 2014-10-22 (×8): via TOPICAL
  Filled 2014-10-20: qty 113

## 2014-10-20 NOTE — Clinical Social Work Note (Signed)
Clinical Social Work Assessment  Patient Details  Name: Travis Romero MRN: [id number] Date of Birth: 05/08/1950  Date of referral:  10/20/14               Reason for consult:  Facility Placement, Discharge Planning                Permission sought to share information with:  Facility Art therapist granted to share information::  Yes, Verbal Permission Granted  Name::        Agency::  SNFs  Relationship::     Contact Information:     Housing/Transportation Living arrangements for the past 2 months:  Apartment Source of Information:  Patient Patient Interpreter Needed:  None Criminal Activity/Legal Involvement Pertinent to Current Situation/Hospitalization:  No - Comment as needed Significant Relationships:  Other Family Members Lives with:  Self Do you feel safe going back to the place where you live?  Yes Need for family participation in patient care:  Yes (Comment)  Care giving concerns:  Patient agrees with PT recs. He reports having a lot of anxiety about his upcoming CABG.    Social Worker assessment / plan:  CSW met with patient at bedside to complete assessment. 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 Patient states that he lives alone in [place] but his main support is Travis Romero (listed in contacts). The patient agrees with PT rec for CIR and would prefer this facility over SNF as he thinks this will get him back to work faster. The patient is open to SNF as well but really wants to compare the financial costs of both before making a decision. Patient would also like more information about the surgery he is going to have and the surgeon who will be operating on him. CSW senses that the patient has a lot of anxiety regarding this upcoming surgery. CSW explained SNF search/placement process and answered the patient's questions. CSW will follow up with bed offers.  Employment status:  Therapist, music:  [employer] PT  Recommendations:  Inpatient Rehab Consult Information / Referral to community resources:  Acute Rehab, [place]  Patient/Family's Response to care:  Patient appears happy with the care he has received. It is important to the patient that the treatment team be transparent and forthcoming with information and updates. He does not feel that he has been updated enough regarding the plan of care.   Patient/Family's Understanding of and Emotional Response to Diagnosis, Current Treatment, and Prognosis:  Patient appears to have fair insight into reason for admission. He does ask what his specific diagnosis is. He wants more information on his upcoming surgery and his post DC options (cost of CIR vs SNF).  Emotional Assessment Appearance:  Appears older than stated age Attitude/Demeanor/Rapport:  Other (Appropriate) Affect (typically observed):  Accepting, Anxious, Appropriate, Pleasant Orientation:  Oriented to Self, Oriented to Place, Oriented to Situation, Oriented to  Time Alcohol / Substance use:  Tobacco Use Psych involvement (Current and /or in the community):  No (Comment)  Discharge Needs  Concerns to be addressed:  Discharge Planning Concerns Readmission within the last 30 days:  No Current discharge risk:  Chronically ill, Physical Impairment Barriers to Discharge:  Continued Medical Work up   [employer] MSW, [place], [place], [phone]

## 2014-10-20 NOTE — Progress Notes (Signed)
[facility] PROGRESS NOTE  Travis Romero APO:[id number] DOB: 07/02/1950 DOA: 10/11/2014 PCP: Orpah Melter, MD  Assessment/Plan: 1-acute hypoxic/hypercapnic respiratory failure secondary to acute pulmonary edema and acute on chronic combined CHF exacerbation -Patient breathing better and easier -Extubated on 10/14/14 -Blood pressure, electrolytes and renal function tolerating current diuresis -Patient ejection fraction 20-25% -Will continue Lasix by mouth twice a day, continue carvedilol, hydralazine, entresto and spironolactone -Will continue aggressive lungs protocol hygiene, continue scheduled bronchodilators and the use of incentive spirometry and flutter valve -Cardiology is on before and helping managing patient's heart failure; will follow rec's -Catheterization (right and left heart cath) done on 7/14 has demonstrated CVR 3 vessels coronary artery disease. Patient has now been seen by cardiothoracic surgeon and that is anticipated tentative CABG 7/20.  -Continue slowly weaning oxygen supplementation as tolerated.  2-acute kidney injury: On ensure of patient baseline renal function. Most likely associated with decreased perfusion with acute on chronic CHF exacerbation. -Creatinine now within normal limits and a GFR suggesting a stage II-III -Cr bump stable at 1.2, cardiology recommended lasix to be changed to once a day  3-leukocytosis: Patient without fever and nontoxic. Unclear etiology -Will follow WBCs trend (continue trending down) -no fever -will hold on antibiotics  4-anxiety: Continue when necessary Xanax  5-physical deconditioning: PT/OT on board -anticipating SNF after CABG for rehabilitation  6-BPH with urinary retention: Foley catheter placed on 7/15 -Continue the use of avodart and add flomax -voiding trials tomorrow am  7-constipation: Will continue the use of Colace and miralax -had BM last night  8-depression: Continue Remeron  9-hyperlipidemia:  continue Lipitor  10-bilateral pleural effusion, status post right thoracentesis with significant improvement on patient's breathing and air movement. -repeated x-ray showing no pulmonary edema, positive emphysematous changes and just small bilateral pleural effusion   11-tobacco abuse: will continue use nicotine patch -cessation counseling provided  Code Status: Full Family Communication: no family at bedside Disposition Plan: Tentatively CABG on 7/20; anticipating skilled nursing facility after discharge for rehabilitation  Consultants: Cardiology PCCM CVTS  Antibiotics:  None   HPI/Subjective: Denies chest pain, abdominal pain. Patient afebrile and endorses a stable breathing. Anxious about CABG; would like foley out and is complaining of dry skin (geenralized)   Objective: Filed Vitals:   10/20/14 0518  BP: 142/80  Pulse: 107  Temp: 97.3 F (36.3 C)  Resp: 18    Intake/Output Summary (Last 24 hours) at 10/20/14 1543 Last data filed at 10/20/14 1048  Gross per 24 hour  Intake   1500 ml  Output   2500 ml  Net  -1000 ml   Filed Weights   10/18/14 0459 10/19/14 0605 10/20/14 0518  Weight: 61.4 kg (135 lb 5.8 oz) 60.868 kg (134 lb 3 oz) 59.2 kg (130 lb 8.2 oz)    Exam:   General:  Frail, chronically ill in appearance; afebrile. Reports no chest pain currently and stable breathing. Anxious about surgery and would like foley out if possible   Cardiovascular: S1 and S2, no rubs, no gallops; mild JVD appreciated on exam  Respiratory: No frank crackles, scattered rhonchi, mild end exp wheezing; improve air movement   Abdomen: Positive bowel sounds, no distention, no guarding  Musculoskeletal: Trace edema bilaterally, no cyanosis or clubbing.  Data Reviewed: Basic Metabolic Panel:  Recent Labs Lab 10/14/14 0405 10/15/14 0335 10/16/14 0342 10/17/14 1042 10/18/14 0325 10/19/14 0330  NA 133* 132* 133* 130* 131* 133*  K 4.0 3.6 4.0 5.1 5.1 4.8  CL 94* 94*  96*  95* 96* 97*  CO2 30 31 28 26 28 29   GLUCOSE 92 141* 153* 179* 131* 136*  BUN 19 18 17 20  22* 28*  CREATININE 1.59* 1.32* 0.95 1.12 1.13 1.27*  CALCIUM 8.4* 7.8* 8.5* 8.8* 8.7* 8.8*  MG 1.9 1.7  --   --   --   --   PHOS 3.9 3.5  --   --   --   --    Liver Function Tests:  Recent Labs Lab 10/13/14 1736 10/16/14 0342  AST 13*  --   ALT 15*  --   ALKPHOS 60  --   BILITOT 0.4  --   PROT 5.2* 5.8*  ALBUMIN 2.4*  --    CBC:  Recent Labs Lab 10/14/14 0405 10/15/14 0335 10/17/14 1042 10/18/14 0325  WBC 10.2 10.0 22.9* 15.9*  NEUTROABS 7.0  --  18.9*  --   HGB 13.3 12.7* 15.3 13.8  HCT 39.6 38.2* 46.1 41.7  MCV 109.1* 108.5* 107.2* 108.6*  PLT 289 283 279 270   BNP (last 3 results)  Recent Labs  10/11/14 1959 10/12/14 1337  BNP 1073.2* 981.7*    Recent Results (from the past 240 hour(s))  MRSA PCR Screening     Status: None   Collection Time: 10/12/14  5:29 AM  Result Value Ref Range Status   MRSA by PCR NEGATIVE NEGATIVE Final    Comment:        The GeneXpert MRSA Assay (FDA approved for NASAL specimens only), is one component of a comprehensive MRSA colonization surveillance program. It is not intended to diagnose MRSA infection nor to guide or monitor treatment for MRSA infections.   Culture, body fluid-bottle     Status: None (Preliminary result)   Collection Time: 10/16/14  2:10 PM  Result Value Ref Range Status   Specimen Description FLUID PLEURAL  Final   Special Requests BOTTLES DRAWN AEROBIC ONLY 5CC  Final   Culture   Final    NO GROWTH 4 DAYS Performed at [facility]    Report Status PENDING  Incomplete  Gram stain     Status: None (Preliminary result)   Collection Time: 10/16/14  2:10 PM  Result Value Ref Range Status   Specimen Description FLUID PLEURAL  Final   Special Requests BOTTLES DRAWN AEROBIC ONLY 5CC  Final   Gram Stain   Final    WBC PRESENT,BOTH PMN AND MONONUCLEAR NO ORGANISMS SEEN CYTOSPIN SLIDE Performed at  [facility]    Report Status PENDING  Incomplete     Studies: Dg Chest 2 View  10/20/2014   CLINICAL DATA:  64 year old male with shortness of breath, hypertension and COPD  EXAM: CHEST  2 VIEW  COMPARISON:  Prior chest x-ray 10/16/2014  FINDINGS: Borderline cardiomegaly similar compared to prior. Atherosclerotic calcifications again noted in the transverse aorta. There are bilateral layering pleural effusions with associated bibasilar atelectasis. Otherwise, no focal infiltrate, evidence of pulmonary edema or pneumothorax. Advanced emphysema with bullous change in the right upper lung.  IMPRESSION: 1. Interval development of a small bilateral layering pleural effusions with associated bibasilar atelectasis. No evidence of pulmonary edema. 2. Similar appearance of advanced upper lung predominant emphysema with bullous change in the right upper lung. 3. Borderline cardiomegaly and aortic atherosclerosis.   Electronically Signed   By: Jacqulynn Cadet M.D.   On: 10/20/2014 08:47    Scheduled Meds: . aspirin  81 mg Oral Daily  . atorvastatin  80 mg Oral q1800  .  carvedilol  6.25 mg Oral BID WC  . cloNIDine  0.2 mg Transdermal Weekly  . docusate sodium  50 mg Oral BID  . dutasteride  0.5 mg Oral Daily  . [START ON 10/21/2014] furosemide  40 mg Oral Daily  . heparin  5,000 Units Subcutaneous 3 times per day  . hydrALAZINE  25 mg Oral 3 times per day  . hydrocerin   Topical TID  . ipratropium-albuterol  3 mL Nebulization QID  . mirtazapine  45 mg Oral QHS  . nicotine  21 mg Transdermal Daily  . polyethylene glycol  17 g Oral Daily  . sacubitril-valsartan  1 tablet Oral BID  . sodium chloride  3 mL Intravenous Q12H  . spironolactone  75 mg Oral Daily   Continuous Infusions:    Active Problems:   CHF exacerbation   Acute respiratory failure with hypoxia   Acute combined systolic and diastolic CHF, NYHA class 4   Acute pulmonary edema   Endotracheal tube present   NSTEMI (non-ST  elevated myocardial infarction)   Acute on chronic combined systolic and diastolic CHF (congestive heart failure)   Shortness of breath   Adjustment disorder with anxious mood   Severe peripheral arterial disease   Coronary artery disease involving native coronary artery of native heart without angina pectoris   SOB (shortness of breath)   Tobacco abuse   HLD (hyperlipidemia)    Time spent: 35 minutes    Barton Dubois  [facility] Pager [phone]. If 7PM-7AM, please contact night-coverage at [email or web address], password [facility] 10/20/2014, 3:43 PM  LOS: 8 days

## 2014-10-20 NOTE — Progress Notes (Signed)
Pt a/o, c/o generalized pain, PRN Norco given as ordered, pt very anxious about upcoming surgery, PRN Xanax given as ordered, pt given bed bath and oob to chair, chair/bed alarm being utilized, VSS, pt stable

## 2014-10-20 NOTE — Progress Notes (Signed)
Consulting cardiologist: Lyman Bishop MD Primary Cardiologist: Wynonia Lawman MD  Cardiology Specific Problem List: 1. Multivessel CAD 2. Mixed CHF Exacerbation NYHA Class 4 3. Acute Pulmonary Edema with Respiratory Failure 4. Ongoing tobacco abuse  Subjective:    Complains of generalized pain and insomnia. Request increases in pain, antianxiety medication, and a sleep aid. He is specific on doses he requests and times.   Objective:   Temp:  [97.3 F (36.3 C)-98.1 F (36.7 C)] 97.3 F (36.3 C) (07/17 0518) Pulse Rate:  [89-107] 107 (07/17 0518) Resp:  [18] 18 (07/17 0518) BP: (134-148)/(72-80) 142/80 mmHg (07/17 0518) SpO2:  [92 %-99 %] 94 % (07/17 0518) Weight:  [130 lb 8.2 oz (59.2 kg)] 130 lb 8.2 oz (59.2 kg) (07/17 0518) Last BM Date: 10/11/14  Filed Weights   10/18/14 0459 10/19/14 0605 10/20/14 0518  Weight: 135 lb 5.8 oz (61.4 kg) 134 lb 3 oz (60.868 kg) 130 lb 8.2 oz (59.2 kg)    Intake/Output Summary (Last 24 hours) at 10/20/14 0735 Last data filed at 10/20/14 0600  Gross per 24 hour  Intake   1700 ml  Output   2800 ml  Net  -1100 ml    Telemetry: NSR   Exam:  General: No acute distress.  HEENT: Conjunctiva and lids normal, oropharynx clear.  Lungs: Clear to auscultation, nonlabored.  Cardiac: No elevated JVP or bruits. RRR, no gallop or rub.   Abdomen: Normoactive bowel sounds, nontender, nondistended.  Extremities: No pitting edema, distal pulses full. Significant dryness and flaking of skin, Venous stasis skin changes.   Neuropsychiatric: Alert and oriented x3, affect appropriate.   Lab Results:  Basic Metabolic Panel:  Recent Labs Lab 10/14/14 0405 10/15/14 0335  10/17/14 1042 10/18/14 0325 10/19/14 0330  NA 133* 132*  < > 130* 131* 133*  K 4.0 3.6  < > 5.1 5.1 4.8  CL 94* 94*  < > 95* 96* 97*  CO2 30 31  < > 26 28 29   GLUCOSE 92 141*  < > 179* 131* 136*  BUN 19 18  < > 20 22* 28*  CREATININE 1.59* 1.32*  < > 1.12 1.13 1.27*    CALCIUM 8.4* 7.8*  < > 8.8* 8.7* 8.8*  MG 1.9 1.7  --   --   --   --   < > = values in this interval not displayed.  Liver Function Tests:  Recent Labs Lab 10/13/14 1736 10/16/14 0342  AST 13*  --   ALT 15*  --   ALKPHOS 60  --   BILITOT 0.4  --   PROT 5.2* 5.8*  ALBUMIN 2.4*  --     CBC:  Recent Labs Lab 10/15/14 0335 10/17/14 1042 10/18/14 0325  WBC 10.0 22.9* 15.9*  HGB 12.7* 15.3 13.8  HCT 38.2* 46.1 41.7  MCV 108.5* 107.2* 108.6*  PLT 283 279 270    Coagulation:  Recent Labs Lab 10/13/14 1736  INR 0.88   Cardiac Cath 10/17/2014  Prox RCA lesion, 100% stenosed.  Prox Cx lesion, 100% stenosed.  Dist LAD lesion, 100% stenosed.  2nd Diag lesion, 75% stenosed.  Prox LAD lesion, 60% stenosed.  There is severe left ventricular systolic dysfunction.  Severe LV dysfunction with an ejection fraction of approximately 25% with diffuse hypocontractility and more pronounced hypokinesis of the midinferior wall. There is at least 2+ and angiographic mitral regurgitation being more pronounced post ectopy beats.  Significant coronary calcification with severe multivessel CAD.  Proximal 60% LAD  stenosis with a twin like LAD with 70% stenosis in the more superior diagonal like branch and total occlusion of the mid LAD. Other twin vessel after septal perforating artery with faint distal filling of this distal segment to the apex.  Normal large ramus intermediate vessel.    Medications:   Scheduled Medications: . aspirin  81 mg Oral Daily  . atorvastatin  80 mg Oral q1800  . carvedilol  6.25 mg Oral BID WC  . cloNIDine  0.2 mg Transdermal Weekly  . docusate sodium  50 mg Oral BID  . dutasteride  0.5 mg Oral Daily  . furosemide  40 mg Oral BID  . heparin  5,000 Units Subcutaneous 3 times per day  . hydrALAZINE  25 mg Oral 3 times per day  . mirtazapine  45 mg Oral QHS  . nicotine  21 mg Transdermal Daily  . polyethylene glycol  17 g Oral Daily  .  sacubitril-valsartan  1 tablet Oral BID  . sodium chloride  3 mL Intravenous Q12H  . spironolactone  75 mg Oral Daily      PRN Medications: sodium chloride, sodium chloride, acetaminophen, ALPRAZolam, bisacodyl, HYDROcodone-acetaminophen, ipratropium-albuterol, ondansetron (ZOFRAN) IV, sodium chloride   Assessment and Plan:   1. Muli-vessel CAD: No specific chest pain, but "hurts all over". He is planned for CABG on Wed. Requests increase in Norco to 10 mg, antianxiety medication increase in frequency and sleep aid as he his nervous about upcoming surgery and cannot sleep. He provides written instructions. Will defer to PTH for those medication adjustments.  Continue ASA, carvedilol, statin.   2.Mixed CHF: He has diuresed  17.1 liters since admission, with 2.8 overnight. He is on lasix 40 mg BID. Creatinine is rising to 1.27. Decrease lasix to daily at 40 mg.  Continue spironolactone. Repeat BMET in am. Lungs are essentially clear, but poor inspiration.   3. Hypertension: Moderately controlled. Continue current doses of clonidine patch, hydralazine,    4. Tobacco abuse: Now with nicotine patch. Denies smoking overnight in the room.   Phill Myron. Jayren Cease NP [place]  10/20/2014, 7:35 AM

## 2014-10-20 NOTE — Progress Notes (Signed)
Pt assessed for qhs NIV. Alert and oriented, stable on room air. Pt declines therapy at this time. VSS and NAD noted.  

## 2014-10-21 DIAGNOSIS — R339 Retention of urine, unspecified: Secondary | ICD-10-CM | POA: Insufficient documentation

## 2014-10-21 LAB — BASIC METABOLIC PANEL
ANION GAP: 9 (ref 5–15)
BUN: 30 mg/dL — AB (ref 6–20)
CHLORIDE: 95 mmol/L — AB (ref 101–111)
CO2: 29 mmol/L (ref 22–32)
Calcium: 8.7 mg/dL — ABNORMAL LOW (ref 8.9–10.3)
Creatinine, Ser: 1.37 mg/dL — ABNORMAL HIGH (ref 0.61–1.24)
GFR, EST NON AFRICAN AMERICAN: 53 mL/min — AB (ref >60)
GLUCOSE: 119 mg/dL — AB (ref 65–99)
Potassium: 4.6 mmol/L (ref 3.5–5.1)
SODIUM: 133 mmol/L — AB (ref 135–145)

## 2014-10-21 LAB — CULTURE, BODY FLUID W GRAM STAIN -BOTTLE

## 2014-10-21 LAB — CBC
HEMATOCRIT: 43.1 % (ref 39.0–52.0)
HEMOGLOBIN: 14.1 g/dL (ref 13.0–17.0)
MCH: 35.3 pg — AB (ref 26.0–34.0)
MCHC: 32.7 g/dL (ref 30.0–36.0)
MCV: 108 fL — ABNORMAL HIGH (ref 78.0–100.0)
Platelets: 348 10*3/uL (ref 150–400)
RBC: 3.99 MIL/uL — ABNORMAL LOW (ref 4.22–5.81)
RDW: 13.7 % (ref 11.5–15.5)
WBC: 8.7 10*3/uL (ref 4.0–10.5)

## 2014-10-21 LAB — CULTURE, BODY FLUID-BOTTLE: CULTURE: NO GROWTH

## 2014-10-21 MED ORDER — ZOLPIDEM TARTRATE 5 MG PO TABS
5.0000 mg | ORAL_TABLET | Freq: Once | ORAL | Status: AC
  Administered 2014-10-21: 5 mg via ORAL
  Filled 2014-10-21: qty 1

## 2014-10-21 NOTE — Progress Notes (Signed)
  DAILY PROGRESS NOTE  Subjective:  Creatinine is still rising. Received lasix today. Will hold tomorrow. Plan for CABG on Wednesday.  Objective:  Temp:  [97.5 F (36.4 C)-97.6 F (36.4 C)] 97.5 F (36.4 C) (07/18 0533) Pulse Rate:  [64-100] 99 (07/18 0850) Resp:  [18-20] 18 (07/18 0533) BP: (120-135)/(68-83) 135/83 mmHg (07/18 0850) SpO2:  [94 %-100 %] 98 % (07/18 0906) Weight:  [129 lb 13 oz (58.882 kg)] 129 lb 13 oz (58.882 kg) (07/18 0533) Weight change: -11.2 oz (-0.318 kg)  Intake/Output from previous day: 07/17 0701 - 07/18 0700 In: 1740 [P.O.:1740] Out: 450 [Urine:450]  Intake/Output from this shift: Total I/O In: 655 [P.O.:655] Out: 1200 [Urine:1200]  Medications: Current Facility-Administered Medications  Medication Dose Route Frequency Provider Last Rate Last Dose  . 0.9 %  sodium chloride infusion  250 mL Intravenous PRN Aaron Trimble, MD      . 0.9 %  sodium chloride infusion  250 mL Intravenous PRN Thomas A Kelly, MD      . acetaminophen (TYLENOL) tablet 650 mg  650 mg Oral Q4H PRN Thomas A Kelly, MD      . ALPRAZolam (XANAX) tablet 0.5 mg  0.5 mg Oral TID PRN Kenneth C Hilty, MD   0.5 mg at 10/21/14 0945  . aspirin chewable tablet 81 mg  81 mg Oral Daily Thomas A Kelly, MD   81 mg at 10/21/14 0946  . atorvastatin (LIPITOR) tablet 80 mg  80 mg Oral q1800 Thomas A Kelly, MD   80 mg at 10/20/14 1743  . bisacodyl (DULCOLAX) suppository 10 mg  10 mg Rectal Daily PRN Carlos Madera, MD      . camphor-menthol (SARNA) lotion   Topical Q8H PRN Carlos Madera, MD      . carvedilol (COREG) tablet 6.25 mg  6.25 mg Oral BID WC Carlos Madera, MD   6.25 mg at 10/21/14 0851  . cloNIDine (CATAPRES - Dosed in mg/24 hr) patch 0.2 mg  0.2 mg Transdermal Weekly William S Tilley, MD   0.2 mg at 10/21/14 0852  . docusate sodium (COLACE) capsule 50 mg  50 mg Oral BID Carlos Madera, MD   50 mg at 10/21/14 0946  . dutasteride (AVODART) capsule 0.5 mg  0.5 mg Oral Daily Peter E  Babcock, NP   0.5 mg at 10/21/14 0946  . furosemide (LASIX) tablet 40 mg  40 mg Oral Daily Kathryn M Lawrence, NP   40 mg at 10/21/14 0946  . heparin injection 5,000 Units  5,000 Units Subcutaneous 3 times per day Thomas A Kelly, MD   5,000 Units at 10/21/14 0633  . hydrALAZINE (APRESOLINE) tablet 25 mg  25 mg Oral 3 times per day William S Tilley, MD   25 mg at 10/21/14 0633  . hydrocerin (EUCERIN) cream   Topical TID Carlos Madera, MD      . HYDROcodone-acetaminophen (NORCO) 10-325 MG per tablet 1 tablet  1 tablet Oral Q4H PRN Kenneth C Hilty, MD   1 tablet at 10/21/14 0946  . ipratropium-albuterol (DUONEB) 0.5-2.5 (3) MG/3ML nebulizer solution 3 mL  3 mL Nebulization QID Carlos Madera, MD   3 mL at 10/21/14 0905  . mirtazapine (REMERON) tablet 45 mg  45 mg Oral QHS Rakesh Alva V, MD   45 mg at 10/20/14 2212  . nicotine (NICODERM CQ - dosed in mg/24 hours) patch 21 mg  21 mg Transdermal Daily Thomas Michael Callahan, NP   21 mg at 10/21/14 0945  . ondansetron (  ZOFRAN) injection 4 mg  4 mg Intravenous Q6H PRN Thomas A Kelly, MD      . polyethylene glycol (MIRALAX / GLYCOLAX) packet 17 g  17 g Oral Daily Carlos Madera, MD   17 g at 10/19/14 1030  . sacubitril-valsartan (ENTRESTO) 24-26 mg per tablet  1 tablet Oral BID William S Tilley, MD   1 tablet at 10/21/14 0946  . sodium chloride 0.9 % injection 3 mL  3 mL Intravenous Q12H Thomas A Kelly, MD   3 mL at 10/21/14 0946  . sodium chloride 0.9 % injection 3 mL  3 mL Intravenous PRN Thomas A Kelly, MD      . spironolactone (ALDACTONE) tablet 75 mg  75 mg Oral Daily Peter E Babcock, NP   75 mg at 10/21/14 0946  . tamsulosin (FLOMAX) capsule 0.4 mg  0.4 mg Oral QPC supper Carlos Madera, MD   0.4 mg at 10/20/14 1743    Physical Exam: General appearance: alert, no distress and Mildly labored breathing Neck: no carotid bruit and no JVD Lungs: diminished breath sounds bilaterally Heart: Regular tachycardia Abdomen: soft, non-tender; bowel sounds  normal; no masses,  no organomegaly Extremities: extremities normal, atraumatic, no cyanosis or edema and venous stasis dermatitis noted Pulses: 2+ and symmetric Skin: Chronic lower extremity stasis edema Neurologic: Grossly normal  Lab Results: Results for orders placed or performed during the hospital encounter of 10/11/14 (from the past 48 hour(s))  CBC     Status: Abnormal   Collection Time: 10/21/14  4:05 AM  Result Value Ref Range   WBC 8.7 4.0 - 10.5 K/uL   RBC 3.99 (L) 4.22 - 5.81 MIL/uL   Hemoglobin 14.1 13.0 - 17.0 g/dL   HCT 43.1 39.0 - 52.0 %   MCV 108.0 (H) 78.0 - 100.0 fL   MCH 35.3 (H) 26.0 - 34.0 pg   MCHC 32.7 30.0 - 36.0 g/dL   RDW 13.7 11.5 - 15.5 %   Platelets 348 150 - 400 K/uL  Basic metabolic panel     Status: Abnormal   Collection Time: 10/21/14  4:05 AM  Result Value Ref Range   Sodium 133 (L) 135 - 145 mmol/L   Potassium 4.6 3.5 - 5.1 mmol/L   Chloride 95 (L) 101 - 111 mmol/L   CO2 29 22 - 32 mmol/L   Glucose, Bld 119 (H) 65 - 99 mg/dL   BUN 30 (H) 6 - 20 mg/dL   Creatinine, Ser 1.37 (H) 0.61 - 1.24 mg/dL   Calcium 8.7 (L) 8.9 - 10.3 mg/dL   GFR calc non Af Amer 53 (L) >60 mL/min   GFR calc Af Amer >60 >60 mL/min    Comment: (NOTE) The eGFR has been calculated using the CKD EPI equation. This calculation has not been validated in all clinical situations. eGFR's persistently <60 mL/min signify possible Chronic Kidney Disease.    Anion gap 9 5 - 15    Imaging: Dg Chest 2 View  10/20/2014   CLINICAL DATA:  63-year-old male with shortness of breath, hypertension and COPD  EXAM: CHEST  2 VIEW  COMPARISON:  Prior chest x-ray 10/16/2014  FINDINGS: Borderline cardiomegaly similar compared to prior. Atherosclerotic calcifications again noted in the transverse aorta. There are bilateral layering pleural effusions with associated bibasilar atelectasis. Otherwise, no focal infiltrate, evidence of pulmonary edema or pneumothorax. Advanced emphysema with bullous  change in the right upper lung.  IMPRESSION: 1. Interval development of a small bilateral layering pleural effusions with associated bibasilar   atelectasis. No evidence of pulmonary edema. 2. Similar appearance of advanced upper lung predominant emphysema with bullous change in the right upper lung. 3. Borderline cardiomegaly and aortic atherosclerosis.   Electronically Signed   By: Heath  McCullough M.D.   On: 10/20/2014 08:47    Assessment:  Active Problems:   CHF exacerbation   Acute respiratory failure with hypoxia   Acute combined systolic and diastolic CHF, NYHA class 4   Acute pulmonary edema   Endotracheal tube present   NSTEMI (non-ST elevated myocardial infarction)   Acute on chronic combined systolic and diastolic CHF (congestive heart failure)   Shortness of breath   Adjustment disorder with anxious mood   Severe peripheral arterial disease   Coronary artery disease involving native coronary artery of native heart without angina pectoris   SOB (shortness of breath)   Tobacco abuse   HLD (hyperlipidemia)   Pleural effusion   BPH (benign prostatic hypertrophy) with urinary retention   Plan:  1. Hold lasix for rising creatinine. Could not get foley out due to voiding problems. Anxiety/pain better controlled. Plan for CABG on Wednesday.  Time Spent Directly with Patient:  15 minutes  Length of Stay:  LOS: 9 days   Kenneth C. Hilty, MD, FACC Attending Cardiologist [facility]  Kenneth C Hilty 10/21/2014, 12:07 PM     

## 2014-10-21 NOTE — Progress Notes (Signed)
Patient bladder scanned 999cc recorded.

## 2014-10-21 NOTE — Progress Notes (Signed)
CSW met with patient this afternoon to discuss d/c options.  P.T recommended CIR- however at present- CIR liaison feels that his [employer] will not approve CIR based on his CHF diagnosis. He will not undergo a CABG procedure on Wednesday.  Discussed with Travis Romero and she will continue to monitor patient's proress post procedure. CSW met with patient and provided current bed offers which are quite limited based on his insurance. Patient is agreeable to consider SNF but is VERY concerned about co-pay amounts and insurance coverage. CSW explained that this information is currently not in place and he requested to be provided with this information.  Patient was noted to be alert and oriented- however- he requested that CSW contact either is cousinCarrington Romero  [phone] or his sister Travis Romero, Travis Romero  [phone] to discuss d/c plan. Patient states that Travis Romero lives in [place] and might be able to stay with patient or have patient come stay with her after his procedure.  His sister is coming to [place] from [place] and is suppose to be in [place] by tomorrow.  CSW left messages for Travis Romero and Travis Romero to call back to discuss further.  Lorie Phenix. Murrell Redden [phone]   Addendum: CSW received call from patient's sister Travis Romero. She stated that she and her other sister Travis Romero (who is coming in from [place]) can provide 24 hour care at home after the procedure if needed. She would love for him to go to CIR if he was eligible. Sister verbalized that patient has spoken to her about his worries about co-pays.  CSW services will continue to monitor but at present- DC plan is either CIR or return home with 24 hour care by sisters and possibly his cousin.   Lorie Phenix. Pauline Good, [place]

## 2014-10-21 NOTE — Progress Notes (Signed)
[facility] PROGRESS NOTE  Travis Romero KGU:[id number] DOB: December 18, 1950 DOA: 10/11/2014 PCP: Orpah Melter, MD  Assessment/Plan: 1-acute hypoxic/hypercapnic respiratory failure secondary to acute pulmonary edema and acute on chronic combined CHF exacerbation -Patient breathing better and easier -Extubated on 10/14/14 -Blood pressure, electrolytes and renal function tolerating current diuresis -Patient ejection fraction 20-25% -Will continue Lasix by mouth twice a day, continue carvedilol, hydralazine, entresto and spironolactone -Will continue aggressive lungs protocol hygiene, continue scheduled bronchodilators and the use of incentive spirometry and flutter valve -Cardiology is on before and helping managing patient's heart failure; will follow rec's -Catheterization (right and left heart cath) done on 7/14 has demonstrated CVR 3 vessels coronary artery disease. Patient has now been seen by cardiothoracic surgeon and that is anticipated tentative CABG 7/20.  -Continue slowly weaning oxygen supplementation as tolerated.  2-acute kidney injury: On ensure of patient baseline renal function. Most likely associated with decreased perfusion with acute on chronic CHF exacerbation. -Creatinine now within normal limits and a GFR suggesting a stage II-III -Cr bump a little more and now is in 1.3 range, cardiology recommended to hold lasix -will monitor   3-leukocytosis: Patient without fever and nontoxic. Unclear etiology -Will follow WBCs trend (continue trending down) -no fever -will hold on antibiotics  4-anxiety: Continue when necessary Xanax  5-physical deconditioning: PT/OT on board -anticipating SNF after CABG for rehabilitation  6-BPH with urinary retention:  -Foley catheter reinserted -Continue the use of avodart and add flomax -voiding trials to be attempted after surgery  -if needed will consult urology   7-constipation: Will continue the use of Colace and  miralax -had BM last night  8-depression: Continue Remeron  9-hyperlipidemia: continue Lipitor  10-bilateral pleural effusion, status post right thoracentesis with significant improvement on patient's breathing and air movement. -repeated x-ray showing no pulmonary edema, positive emphysematous changes and just small bilateral pleural effusion   11-tobacco abuse: will continue use nicotine patch -cessation counseling provided  Code Status: Full Family Communication: no family at bedside Disposition Plan: Tentatively CABG on 7/20; anticipating skilled nursing facility after discharge for rehabilitation  Consultants: Cardiology PCCM CVTS  Antibiotics:  None   HPI/Subjective: Denies chest pain, abdominal pain. Patient afebrile and endorses no problem with breathing. Failed voiding trials   Objective: Filed Vitals:   10/21/14 0850  BP: 135/83  Pulse: 99  Temp:   Resp:     Intake/Output Summary (Last 24 hours) at 10/21/14 1300 Last data filed at 10/21/14 0950  Gross per 24 hour  Intake   1675 ml  Output   1200 ml  Net    475 ml   Filed Weights   10/19/14 0605 10/20/14 0518 10/21/14 0533  Weight: 60.868 kg (134 lb 3 oz) 59.2 kg (130 lb 8.2 oz) 58.882 kg (129 lb 13 oz)    Exam:   General:  chronically ill in appearance; afebrile. Reports no chest pain and with stable breathing. Anxious about surgery. failed voiding trials   Cardiovascular: S1 and S2, no rubs, no gallops; mild JVD appreciated on exam  Respiratory: No frank crackles, scattered rhonchi, mild end exp wheezing; improve air movement   Abdomen: Positive bowel sounds, no distention, no guarding  Musculoskeletal: Trace edema bilaterally, no cyanosis or clubbing.  Data Reviewed: Basic Metabolic Panel:  Recent Labs Lab 10/15/14 0335 10/16/14 0342 10/17/14 1042 10/18/14 0325 10/19/14 0330 10/21/14 0405  NA 132* 133* 130* 131* 133* 133*  K 3.6 4.0 5.1 5.1 4.8 4.6  CL 94* 96* 95* 96* 97* 95*  CO2 31 28 26 28 29 29   GLUCOSE 141* 153* 179* 131* 136* 119*  BUN 18 17 20  22* 28* 30*  CREATININE 1.32* 0.95 1.12 1.13 1.27* 1.37*  CALCIUM 7.8* 8.5* 8.8* 8.7* 8.8* 8.7*  MG 1.7  --   --   --   --   --   PHOS 3.5  --   --   --   --   --    Liver Function Tests:  Recent Labs Lab 10/16/14 0342  PROT 5.8*   CBC:  Recent Labs Lab 10/15/14 0335 10/17/14 1042 10/18/14 0325 10/21/14 0405  WBC 10.0 22.9* 15.9* 8.7  NEUTROABS  --  18.9*  --   --   HGB 12.7* 15.3 13.8 14.1  HCT 38.2* 46.1 41.7 43.1  MCV 108.5* 107.2* 108.6* 108.0*  PLT 283 279 270 348   BNP (last 3 results)  Recent Labs  10/11/14 1959 10/12/14 1337  BNP 1073.2* 981.7*    Recent Results (from the past 240 hour(s))  MRSA PCR Screening     Status: None   Collection Time: 10/12/14  5:29 AM  Result Value Ref Range Status   MRSA by PCR NEGATIVE NEGATIVE Final    Comment:        The GeneXpert MRSA Assay (FDA approved for NASAL specimens only), is one component of a comprehensive MRSA colonization surveillance program. It is not intended to diagnose MRSA infection nor to guide or monitor treatment for MRSA infections.   Culture, body fluid-bottle     Status: None (Preliminary result)   Collection Time: 10/16/14  2:10 PM  Result Value Ref Range Status   Specimen Description FLUID PLEURAL  Final   Special Requests BOTTLES DRAWN AEROBIC ONLY 5CC  Final   Culture   Final    NO GROWTH 4 DAYS Performed at [facility]    Report Status PENDING  Incomplete  Gram stain     Status: None (Preliminary result)   Collection Time: 10/16/14  2:10 PM  Result Value Ref Range Status   Specimen Description FLUID PLEURAL  Final   Special Requests BOTTLES DRAWN AEROBIC ONLY 5CC  Final   Gram Stain   Final    WBC PRESENT,BOTH PMN AND MONONUCLEAR NO ORGANISMS SEEN CYTOSPIN SLIDE Performed at [facility]    Report Status PENDING  Incomplete     Studies: Dg Chest 2 View  10/20/2014   CLINICAL  DATA:  64 year old male with shortness of breath, hypertension and COPD  EXAM: CHEST  2 VIEW  COMPARISON:  Prior chest x-ray 10/16/2014  FINDINGS: Borderline cardiomegaly similar compared to prior. Atherosclerotic calcifications again noted in the transverse aorta. There are bilateral layering pleural effusions with associated bibasilar atelectasis. Otherwise, no focal infiltrate, evidence of pulmonary edema or pneumothorax. Advanced emphysema with bullous change in the right upper lung.  IMPRESSION: 1. Interval development of a small bilateral layering pleural effusions with associated bibasilar atelectasis. No evidence of pulmonary edema. 2. Similar appearance of advanced upper lung predominant emphysema with bullous change in the right upper lung. 3. Borderline cardiomegaly and aortic atherosclerosis.   Electronically Signed   By: Jacqulynn Cadet M.D.   On: 10/20/2014 08:47    Scheduled Meds: . aspirin  81 mg Oral Daily  . atorvastatin  80 mg Oral q1800  . carvedilol  6.25 mg Oral BID WC  . cloNIDine  0.2 mg Transdermal Weekly  . docusate sodium  50 mg Oral BID  . dutasteride  0.5 mg  Oral Daily  . heparin  5,000 Units Subcutaneous 3 times per day  . hydrALAZINE  25 mg Oral 3 times per day  . hydrocerin   Topical TID  . ipratropium-albuterol  3 mL Nebulization QID  . mirtazapine  45 mg Oral QHS  . nicotine  21 mg Transdermal Daily  . polyethylene glycol  17 g Oral Daily  . sacubitril-valsartan  1 tablet Oral BID  . sodium chloride  3 mL Intravenous Q12H  . spironolactone  75 mg Oral Daily  . tamsulosin  0.4 mg Oral QPC supper   Continuous Infusions:    Active Problems:   CHF exacerbation   Acute respiratory failure with hypoxia   Acute combined systolic and diastolic CHF, NYHA class 4   Acute pulmonary edema   Endotracheal tube present   NSTEMI (non-ST elevated myocardial infarction)   Acute on chronic combined systolic and diastolic CHF (congestive heart failure)   Shortness of  breath   Adjustment disorder with anxious mood   Severe peripheral arterial disease   Coronary artery disease involving native coronary artery of native heart without angina pectoris   SOB (shortness of breath)   Tobacco abuse   HLD (hyperlipidemia)   Pleural effusion   BPH (benign prostatic hypertrophy) with urinary retention    Time spent: 35 minutes    Barton Dubois  [facility] Pager [phone]. If 7PM-7AM, please contact night-coverage at [email or web address], password [email or web address] 10/21/2014, 1:00 PM  LOS: 9 days

## 2014-10-22 LAB — URINALYSIS, ROUTINE W REFLEX MICROSCOPIC
Bilirubin Urine: NEGATIVE
GLUCOSE, UA: NEGATIVE mg/dL
HGB URINE DIPSTICK: NEGATIVE
KETONES UR: NEGATIVE mg/dL
Leukocytes, UA: NEGATIVE
Nitrite: NEGATIVE
PH: 7 (ref 5.0–8.0)
PROTEIN: 100 mg/dL — AB
Specific Gravity, Urine: 1.016 (ref 1.005–1.030)
Urobilinogen, UA: 1 mg/dL (ref 0.0–1.0)

## 2014-10-22 LAB — COMPREHENSIVE METABOLIC PANEL
ALT: 46 U/L (ref 17–63)
AST: 21 U/L (ref 15–41)
Albumin: 2 g/dL — ABNORMAL LOW (ref 3.5–5.0)
Alkaline Phosphatase: 85 U/L (ref 38–126)
Anion gap: 7 (ref 5–15)
BUN: 33 mg/dL — ABNORMAL HIGH (ref 6–20)
CALCIUM: 8.8 mg/dL — AB (ref 8.9–10.3)
CHLORIDE: 95 mmol/L — AB (ref 101–111)
CO2: 33 mmol/L — AB (ref 22–32)
CREATININE: 1.35 mg/dL — AB (ref 0.61–1.24)
GFR calc non Af Amer: 54 mL/min — ABNORMAL LOW (ref >60)
Glucose, Bld: 87 mg/dL (ref 65–99)
Potassium: 4.6 mmol/L (ref 3.5–5.1)
Sodium: 135 mmol/L (ref 135–145)
Total Bilirubin: 0.3 mg/dL (ref 0.3–1.2)
Total Protein: 5.1 g/dL — ABNORMAL LOW (ref 6.5–8.1)

## 2014-10-22 LAB — TYPE AND SCREEN
ABO/RH(D): O POS
ANTIBODY SCREEN: NEGATIVE

## 2014-10-22 LAB — BASIC METABOLIC PANEL
Anion gap: 9 (ref 5–15)
BUN: 29 mg/dL — ABNORMAL HIGH (ref 6–20)
CHLORIDE: 93 mmol/L — AB (ref 101–111)
CO2: 33 mmol/L — ABNORMAL HIGH (ref 22–32)
Calcium: 9.3 mg/dL (ref 8.9–10.3)
Creatinine, Ser: 1.31 mg/dL — ABNORMAL HIGH (ref 0.61–1.24)
GFR calc Af Amer: >60 mL/min (ref >60)
GFR, EST NON AFRICAN AMERICAN: 56 mL/min — AB (ref >60)
Glucose, Bld: 116 mg/dL — ABNORMAL HIGH (ref 65–99)
POTASSIUM: 4.6 mmol/L (ref 3.5–5.1)
Sodium: 135 mmol/L (ref 135–145)

## 2014-10-22 LAB — URINE MICROSCOPIC-ADD ON

## 2014-10-22 LAB — APTT: aPTT: 27 seconds (ref 24–37)

## 2014-10-22 LAB — ABO/RH: ABO/RH(D): O POS

## 2014-10-22 LAB — PROTIME-INR
INR: 0.98 (ref 0.00–1.49)
Prothrombin Time: 13.2 seconds (ref 11.6–15.2)

## 2014-10-22 MED ORDER — VANCOMYCIN HCL 10 G IV SOLR
1250.0000 mg | INTRAVENOUS | Status: AC
  Administered 2014-10-23: 1250 mg via INTRAVENOUS
  Filled 2014-10-22: qty 1250

## 2014-10-22 MED ORDER — TEMAZEPAM 15 MG PO CAPS: 15.0000 mg | ORAL_CAPSULE | Freq: Once | ORAL | Status: DC | PRN

## 2014-10-22 MED ORDER — DEXTROSE 5 % IV SOLN
0.0000 ug/min | INTRAVENOUS | Status: DC
  Filled 2014-10-22: qty 4

## 2014-10-22 MED ORDER — ZOLPIDEM TARTRATE 5 MG PO TABS
5.0000 mg | ORAL_TABLET | Freq: Once | ORAL | Status: AC
  Administered 2014-10-23: 5 mg via ORAL
  Filled 2014-10-22 (×2): qty 1

## 2014-10-22 MED ORDER — DEXTROSE 5 % IV SOLN
1.5000 g | INTRAVENOUS | Status: AC
  Administered 2014-10-23: 1.5 g via INTRAVENOUS
  Administered 2014-10-23: .75 g via INTRAVENOUS
  Filled 2014-10-22: qty 1.5

## 2014-10-22 MED ORDER — PHENYLEPHRINE HCL 10 MG/ML IJ SOLN
30.0000 ug/min | INTRAVENOUS | Status: AC
  Administered 2014-10-23: 50 ug/min via INTRAVENOUS
  Filled 2014-10-22: qty 2

## 2014-10-22 MED ORDER — AMINOCAPROIC ACID 250 MG/ML IV SOLN
INTRAVENOUS | Status: DC
  Filled 2014-10-22: qty 40

## 2014-10-22 MED ORDER — NITROGLYCERIN IN D5W 200-5 MCG/ML-% IV SOLN
2.0000 ug/min | INTRAVENOUS | Status: AC
  Administered 2014-10-23: 5 ug/min via INTRAVENOUS
  Filled 2014-10-22: qty 250

## 2014-10-22 MED ORDER — CHLORHEXIDINE GLUCONATE 4 % EX LIQD
60.0000 mL | Freq: Once | CUTANEOUS | Status: AC
  Administered 2014-10-22: 4 via TOPICAL
  Filled 2014-10-22: qty 60

## 2014-10-22 MED ORDER — DIAZEPAM 5 MG PO TABS
5.0000 mg | ORAL_TABLET | Freq: Once | ORAL | Status: AC
  Administered 2014-10-23: 5 mg via ORAL
  Filled 2014-10-22: qty 1

## 2014-10-22 MED ORDER — BISACODYL 5 MG PO TBEC
5.0000 mg | DELAYED_RELEASE_TABLET | Freq: Once | ORAL | Status: AC
  Administered 2014-10-22: 5 mg via ORAL
  Filled 2014-10-22: qty 1

## 2014-10-22 MED ORDER — MAGNESIUM SULFATE 50 % IJ SOLN
40.0000 meq | INTRAMUSCULAR | Status: DC
  Filled 2014-10-22: qty 10

## 2014-10-22 MED ORDER — DOPAMINE-DEXTROSE 3.2-5 MG/ML-% IV SOLN
0.0000 ug/kg/min | INTRAVENOUS | Status: AC
Start: 2014-10-23 — End: 2014-10-23
  Administered 2014-10-23: 3 ug/kg/min via INTRAVENOUS
  Filled 2014-10-22: qty 250

## 2014-10-22 MED ORDER — INSULIN REGULAR HUMAN 100 UNIT/ML IJ SOLN
INTRAMUSCULAR | Status: DC
  Filled 2014-10-22: qty 2.5

## 2014-10-22 MED ORDER — CHLORHEXIDINE GLUCONATE 4 % EX LIQD
60.0000 mL | Freq: Once | CUTANEOUS | Status: AC
  Administered 2014-10-23: 4 via TOPICAL
  Filled 2014-10-22: qty 60

## 2014-10-22 MED ORDER — DEXMEDETOMIDINE HCL IN NACL 400 MCG/100ML IV SOLN
0.1000 ug/kg/h | INTRAVENOUS | Status: DC
Start: 2014-10-23 — End: 2014-10-23
  Filled 2014-10-22: qty 100

## 2014-10-22 MED ORDER — SODIUM CHLORIDE 0.9 % IV SOLN
INTRAVENOUS | Status: DC
  Filled 2014-10-22: qty 30

## 2014-10-22 MED ORDER — ZOLPIDEM TARTRATE 5 MG PO TABS: 5.0000 mg | ORAL_TABLET | Freq: Once | ORAL | Status: DC

## 2014-10-22 MED ORDER — PLASMA-LYTE 148 IV SOLN
INTRAVENOUS | Status: AC
  Administered 2014-10-23: 500 mL
  Filled 2014-10-22: qty 2.5

## 2014-10-22 MED ORDER — METOPROLOL TARTRATE 12.5 MG HALF TABLET
12.5000 mg | ORAL_TABLET | Freq: Once | ORAL | Status: AC
  Administered 2014-10-23: 12.5 mg via ORAL
  Filled 2014-10-22 (×2): qty 1

## 2014-10-22 MED ORDER — DEXTROSE 5 % IV SOLN
750.0000 mg | INTRAVENOUS | Status: DC
  Filled 2014-10-22 (×2): qty 750

## 2014-10-22 MED ORDER — POTASSIUM CHLORIDE 2 MEQ/ML IV SOLN
80.0000 meq | INTRAVENOUS | Status: DC
  Filled 2014-10-22: qty 40

## 2014-10-22 NOTE — Progress Notes (Signed)
[facility] informed that pt requested medication for sleep stated the he has not slept well this admission. New order of one time dose of Ambien 5mg  was ordered. Will continue to monitor. Arthor Captain LPN

## 2014-10-22 NOTE — Progress Notes (Addendum)
Educated pt on IS use r/t pre-op CABG.  Gave pt IS sheet.  Cardiac Surgery Education Packet given to pt by the cardiac rehab nurse.

## 2014-10-22 NOTE — Progress Notes (Signed)
5 Days Post-Op Procedure(s) (LRB): Right/Left Heart Cath and Coronary Angiography (N/A) Subjective: No chest pain or shortness of breath Concerned about being a "fast metabolizer"  Objective: Vital signs in last 24 hours: Temp:  [98 F (36.7 C)-98.6 F (37 C)] 98 F (36.7 C) (07/19 0533) Pulse Rate:  [92-99] 94 (07/19 0533) Cardiac Rhythm:  [-] Normal sinus rhythm (07/18 1952) Resp:  [18] 18 (07/19 0533) BP: (122-137)/(70-81) 137/78 mmHg (07/19 0533) SpO2:  [90 %-100 %] 92 % (07/19 0605) Weight:  [127 lb 6.8 oz (57.8 kg)] 127 lb 6.8 oz (57.8 kg) (07/19 0533)  Hemodynamic parameters for last 24 hours:    Intake/Output from previous day: 07/18 0701 - 07/19 0700 In: 1690 [P.O.:1690] Out: 2825 [Urine:2825] Intake/Output this shift: Total I/O In: 660 [P.O.:660] Out: 1200 [Urine:1200]  General appearance: alert, cooperative and no distress Neurologic: intact Heart: regular rate and rhythm and no audible murmur Lungs: diminished breath sounds bilaterally Extremities: venous stasis dermatitis noted and no edema  Lab Results:  Recent Labs  10/21/14 0405  WBC 8.7  HGB 14.1  HCT 43.1  PLT 348   BMET:  Recent Labs  10/21/14 0405  NA 133*  K 4.6  CL 95*  CO2 29  GLUCOSE 119*  BUN 30*  CREATININE 1.37*  CALCIUM 8.7*    PT/INR: No results for input(s): LABPROT, INR in the last 72 hours. ABG    Component Value Date/Time   PHART 7.364 10/17/2014 1358   HCO3 27.4* 10/17/2014 1358   TCO2 29 10/17/2014 1358   ACIDBASEDEF 2.0 10/12/2014 0442   O2SAT 93.0 10/17/2014 1358   CBG (last 3)  No results for input(s): GLUCAP in the last 72 hours.  Assessment/Plan: S/P Procedure(s) (LRB): Right/Left Heart Cath and Coronary Angiography (N/A) -  64 yo man with severe 3 vessel CAD, ischemic cardiomyopathy, severe COPD and severe PAD.  He is scheduled for CABG tomorrow AM. His most recent creatinine was trending up (1.37) and he needs a repeat today. Will plan to go ahead  with surgery tomorrow unless creatinine is still rising.  I once again emphasized the high risk nature of the procedure given the severity of his LV dysfunction and other severe co-morbidities. He is at high risk for stroke, respiratory complications, renal failure, wound healing issues, and incomplete revascularization. He is aware of the high risk nature of the procedure and does want to proceed.   LOS: 10 days    Melrose Nakayama 10/22/2014

## 2014-10-22 NOTE — Progress Notes (Signed)
[facility] PROGRESS NOTE  Travis Romero TKZ:[id number] DOB: 1950-07-26 DOA: 10/11/2014 PCP: Orpah Melter, MD  Brief summary 64 yo man with a history of tobacco abuse, COPD, PAD, BPH, hypertension and adjustment disorder. He has no prior cardiac history. He had been getting progressively more short of breath for about a week PTA. He was at work last Friday and a co-worker noted him laboring to breathe. EMS was called and he was brought to the ED. He had progressive acute on chronic respiratory failure and was intubated. It was determined he had acute systolic and diastolic CHF. He was weaned and extubated. An echo showed severe LV dysfunction with an EF of 20-25% and mild MR. Patient Had cath that demonstrated severe 3 vessels disease and is now tentatively waiting for CABG on 7/20. Patient with slight acute on chronic renal failure with diuresis and also with urinary retention due to BPH, has failed voiding trials.   Assessment/Plan: 1-acute hypoxic/hypercapnic respiratory failure secondary to acute pulmonary edema and acute on chronic combined CHF exacerbation -Patient breathing better and easier -Extubated on 10/14/14 -Blood pressure, electrolytes and renal function tolerating current diuresis -Patient ejection fraction 20-25% -Will continue Lasix by mouth twice a day, continue carvedilol, hydralazine, entresto and spironolactone -Will continue aggressive lungs protocol hygiene, continue scheduled bronchodilators and the use of incentive spirometry and flutter valve -Cardiology is on before and helping managing patient's heart failure; will follow rec's -Catheterization (right and left heart cath) done on 7/14 has demonstrated CVR 3 vessels coronary artery disease. Patient has now been seen by cardiothoracic surgeon and that is anticipated tentative CABG 7/20.  -Continue slowly weaning oxygen supplementation as tolerated.  2-acute kidney injury: On ensure of patient baseline renal  function. Most likely associated with decreased perfusion with acute on chronic CHF exacerbation. -Creatinine now within normal limits and a GFR suggesting a stage II-III -Cr bump a little more and now is stable in 1.3 range, cardiology helping with lasix -will monitor   3-leukocytosis: Patient without fever and nontoxic. Unclear etiology -Will follow WBCs trend (continue trending down) -no fever -will hold on antibiotics  4-anxiety: Continue when necessary Xanax  5-physical deconditioning: PT/OT on board -anticipating SNF after CABG for rehabilitation  6-BPH with urinary retention:  -Foley catheter reinserted -Continue the use of avodart and add flomax -voiding trials to be attempted after surgery  -if needed will consult urology   7-constipation: Will continue the use of Colace and miralax -had BM last night  8-depression: Continue Remeron  9-hyperlipidemia: continue Lipitor  10-bilateral pleural effusion, status post right thoracentesis with significant improvement on patient's breathing and air movement. -repeated x-ray showing no pulmonary edema, positive emphysematous changes and just small bilateral pleural effusion   11-tobacco abuse: will continue use nicotine patch -cessation counseling provided  Code Status: Full Family Communication: no family at bedside Disposition Plan: Tentatively CABG on 7/20; anticipating skilled nursing facility after discharge for rehabilitation  Consultants: Cardiology PCCM CVTS  Antibiotics:  None   HPI/Subjective: Denies chest pain, abdominal pain. Patient afebrile and denies SOB. Anxious but ready for surgery.   Objective: Filed Vitals:   10/22/14 1426  BP: 127/65  Pulse: 92  Temp: 97.5 F (36.4 C)  Resp: 18    Intake/Output Summary (Last 24 hours) at 10/22/14 1657 Last data filed at 10/22/14 1433  Gross per 24 hour  Intake   1825 ml  Output   2550 ml  Net   -725 ml   Filed Weights   10/20/14 0518 10/21/14  0533 10/22/14 0533  Weight: 59.2 kg (130 lb 8.2 oz) 58.882 kg (129 lb 13 oz) 57.8 kg (127 lb 6.8 oz)    Exam:   General:  chronically ill in appearance; afebrile. Endorses no chest pain and with stable breathing. Anxious about surgery, but ready and want to proceed.   Cardiovascular: S1 and S2, no rubs, no gallops; mild JVD appreciated on exam  Respiratory: No frank crackles, scattered rhonchi, mild end exp wheezing; improve air movement   Abdomen: Positive bowel sounds, no distention, no guarding  Musculoskeletal: Trace edema bilaterally, no cyanosis or clubbing.  Data Reviewed: Basic Metabolic Panel:  Recent Labs Lab 10/18/14 0325 10/19/14 0330 10/21/14 0405 10/22/14 1122 10/22/14 1522  NA 131* 133* 133* 135 135  K 5.1 4.8 4.6 4.6 4.6  CL 96* 97* 95* 93* 95*  CO2 28 29 29  33* 33*  GLUCOSE 131* 136* 119* 116* 87  BUN 22* 28* 30* 29* 33*  CREATININE 1.13 1.27* 1.37* 1.31* 1.35*  CALCIUM 8.7* 8.8* 8.7* 9.3 8.8*   Liver Function Tests:  Recent Labs Lab 10/16/14 0342 10/22/14 1522  AST  --  21  ALT  --  46  ALKPHOS  --  85  BILITOT  --  0.3  PROT 5.8* 5.1*  ALBUMIN  --  2.0*   CBC:  Recent Labs Lab 10/17/14 1042 10/18/14 0325 10/21/14 0405  WBC 22.9* 15.9* 8.7  NEUTROABS 18.9*  --   --   HGB 15.3 13.8 14.1  HCT 46.1 41.7 43.1  MCV 107.2* 108.6* 108.0*  PLT 279 270 348   BNP (last 3 results)  Recent Labs  10/11/14 1959 10/12/14 1337  BNP 1073.2* 981.7*    Recent Results (from the past 240 hour(s))  Culture, body fluid-bottle     Status: None   Collection Time: 10/16/14  2:10 PM  Result Value Ref Range Status   Specimen Description FLUID PLEURAL  Final   Special Requests BOTTLES DRAWN AEROBIC ONLY 5CC  Final   Culture   Final    NO GROWTH 5 DAYS Performed at [facility]    Report Status 10/21/2014 FINAL  Final  Gram stain     Status: None (Preliminary result)   Collection Time: 10/16/14  2:10 PM  Result Value Ref Range Status    Specimen Description FLUID PLEURAL  Final   Special Requests BOTTLES DRAWN AEROBIC ONLY 5CC  Final   Gram Stain   Final    WBC PRESENT,BOTH PMN AND MONONUCLEAR NO ORGANISMS SEEN CYTOSPIN SLIDE Performed at [facility]    Report Status PENDING  Incomplete     Studies: No results found.  Scheduled Meds: . [START ON 10/23/2014] aminocaproic acid (AMICAR) for OHS   Intravenous To OR  . aspirin  81 mg Oral Daily  . atorvastatin  80 mg Oral q1800  . carvedilol  6.25 mg Oral BID WC  . [START ON 10/23/2014] cefUROXime (ZINACEF)  IV  1.5 g Intravenous To OR  . [START ON 10/23/2014] cefUROXime (ZINACEF)  IV  750 mg Intravenous To OR  . chlorhexidine  60 mL Topical Once   And  . [START ON 10/23/2014] chlorhexidine  60 mL Topical Once  . [START ON 10/23/2014] dexmedetomidine  0.1-0.7 mcg/kg/hr Intravenous To OR  . [START ON 10/23/2014] diazepam  5 mg Oral Once  . docusate sodium  50 mg Oral BID  . [START ON 10/23/2014] DOPamine  0-10 mcg/kg/min Intravenous To OR  . dutasteride  0.5 mg Oral  Daily  . [START ON 10/23/2014] epinephrine  0-10 mcg/min Intravenous To OR  . [START ON 10/23/2014] heparin-papaverine-plasmalyte irrigation   Irrigation To OR  . [START ON 10/23/2014] heparin 30,000 units/NS 1000 mL solution for CELLSAVER   Other To OR  . heparin  5,000 Units Subcutaneous 3 times per day  . hydrALAZINE  25 mg Oral 3 times per day  . hydrocerin   Topical TID  . [START ON 10/23/2014] insulin (NOVOLIN-R) infusion   Intravenous To OR  . ipratropium-albuterol  3 mL Nebulization QID  . [START ON 10/23/2014] magnesium sulfate  40 mEq Other To OR  . [START ON 10/23/2014] metoprolol tartrate  12.5 mg Oral Once  . mirtazapine  45 mg Oral QHS  . nicotine  21 mg Transdermal Daily  . [START ON 10/23/2014] nitroGLYCERIN  2-200 mcg/min Intravenous To OR  . [START ON 10/23/2014] phenylephrine (NEO-SYNEPHRINE) Adult infusion  30-200 mcg/min Intravenous To OR  . polyethylene glycol  17 g Oral Daily  .  [START ON 10/23/2014] potassium chloride  80 mEq Other To OR  . sacubitril-valsartan  1 tablet Oral BID  . sodium chloride  3 mL Intravenous Q12H  . spironolactone  75 mg Oral Daily  . tamsulosin  0.4 mg Oral QPC supper  . [START ON 10/23/2014] vancomycin  1,250 mg Intravenous To OR   Continuous Infusions:    Active Problems:   CHF exacerbation   Acute respiratory failure with hypoxia   Acute combined systolic and diastolic CHF, NYHA class 4   Acute pulmonary edema   Endotracheal tube present   NSTEMI (non-ST elevated myocardial infarction)   Acute on chronic combined systolic and diastolic CHF (congestive heart failure)   Shortness of breath   Adjustment disorder with anxious mood   Severe peripheral arterial disease   Coronary artery disease involving native coronary artery of native heart without angina pectoris   SOB (shortness of breath)   Tobacco abuse   HLD (hyperlipidemia)   Pleural effusion   BPH (benign prostatic hypertrophy) with urinary retention   Urinary retention    Time spent: 35 minutes    Barton Dubois  [facility] Pager [phone]. If 7PM-7AM, please contact night-coverage at [email or web address], password [facility] 10/22/2014, 4:57 PM  LOS: 10 days

## 2014-10-22 NOTE — Progress Notes (Signed)
Catapres patch removed per MDO request.

## 2014-10-22 NOTE — Progress Notes (Signed)
Nutrition Follow-up   INTERVENTION:   Encourage meals and snacks.   NUTRITION DIAGNOSIS:   Increased nutrient needs related to wound healing (surgery) as evidenced by estimated needs.  ongoing  GOAL:   Patient will meet greater than or equal to 90% of their needs  Met.   MONITOR:   PO intake, Weight trends, Labs, I & O's  REASON FOR ASSESSMENT:   Ventilator    ASSESSMENT:   Pt with hx of severe COPD and CHF who was admitted with dyspnea.   Pt required intubation but extubated 7/11. Pt consuming 100% of diet since advanced. Plan for CABG 7/20.   Diet Order:  Diet Heart Room service appropriate?: Yes; Fluid consistency:: Thin Diet NPO time specified  Skin:  Wound (see comment) (blister, excoriated, skin tear to leg)  Last BM:  7/16 on miralax  Height:   Ht Readings from Last 1 Encounters:  10/12/14 5' 7"  (1.702 m)    Weight:   Wt Readings from Last 1 Encounters:  10/22/14 127 lb 6.8 oz (57.8 kg)    Ideal Body Weight:  67.3 kg (kg)  Wt Readings from Last 10 Encounters:  10/22/14 127 lb 6.8 oz (57.8 kg)  10/07/14 151 lb (68.493 kg)    BMI:  Body mass index is 19.95 kg/(m^2).  Estimated Nutritional Needs:   Kcal:  1540-0867  Protein:  65-80 grams  Fluid:  2 L/day  EDUCATION NEEDS:   No education needs identified at this time  [place], [place], [place] Pager [phone] After Hours Pager

## 2014-10-22 NOTE — Progress Notes (Signed)
CARDIAC REHAB PHASE I    Pt lying in bed eating breakfast. Pt drowsy this morning, falling asleep initially during conversation. Pre-op educaiton completed with pt. Reviewed IS (no IS available on floor, sent A-11 request for one, RN notified, states she will review use with pt), sternal precautions, activity progression, cardiac surgery book and surgery guidelines. Pt verbalized understanding, will need reinforcement after surgery. 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 PT to see today. Pt states he does not have a plan for discharge at this time but is communicating with social work to develop a discharge plan. Pt in bed watching "preparing for cardiac surgery" video, call bell within reach.  Will follow-up post-op.  [id number]   Lenna Sciara, RN, BSN 10/22/2014 8:35 AM

## 2014-10-22 NOTE — Anesthesia Preprocedure Evaluation (Addendum)
Anesthesia Evaluation  Patient identified by MRN, date of birth, ID band Patient awake    Reviewed: Allergy & Precautions, NPO status , Patient's Chart, lab work & pertinent test results, reviewed documented beta blocker date and time   Airway Mallampati: II  TM Distance: >3 FB Neck ROM: Limited    Dental  (+) Teeth Intact, Dental Advisory Given   Pulmonary COPD oxygen dependent, Current Smoker,  breath sounds clear to auscultation        Cardiovascular hypertension, Pt. on medications and Pt. on home beta blockers + CAD, + Past MI, + Peripheral Vascular Disease and +CHF Rhythm:Regular Rate:Normal     Neuro/Psych    GI/Hepatic   Endo/Other    Renal/GU      Musculoskeletal   Abdominal   Peds  Hematology   Anesthesia Other Findings   Reproductive/Obstetrics                          Anesthesia Physical Anesthesia Plan  ASA: III  Anesthesia Plan: General   Post-op Pain Management:    Induction: Intravenous  Airway Management Planned: Oral ETT  Additional Equipment: Arterial line, CVP, 3D TEE, Ultrasound Guidance Line Placement and PA Cath  Intra-op Plan:   Post-operative Plan: Post-operative intubation/ventilation  Informed Consent: I have reviewed the patients History and Physical, chart, labs and discussed the procedure including the risks, benefits and alternatives for the proposed anesthesia with the patient or authorized representative who has indicated his/her understanding and acceptance.   Dental advisory given  Plan Discussed with: CRNA, Anesthesiologist and Surgeon  Anesthesia Plan Comments:        Anesthesia Quick Evaluation

## 2014-10-22 NOTE — Progress Notes (Signed)
Occupational Therapy Treatment Patient Details Name: Travis Romero MRN: [id number] DOB: 14-Apr-1950 Today's Date: 10/22/2014    History of present illness Pt was admitted with progressive dyspnea and dx'd with CHF exacerbation.  He was intubated 7/8 - 7/11.  Pt has a h/o HTN, COPD, and back pain. Pt with AMS since extubation. Planned CABG 7/20   OT comments  Pt continues to be easily distracted with decreased task persistence.  Pt recalled and performed UB theraband exercises and seated grooming with verbal cues for thoroughness.  Stood x 1 with OT and NT with +2 min assist for pressure relief and to place pillow.  Unable to stand without UE support for balance.   Follow Up Recommendations  SNF;Supervision/Assistance - 24 hour    Equipment Recommendations       Recommendations for Other Services      Precautions / Restrictions Precautions Precautions: Fall Precaution Comments: watch sats Restrictions Weight Bearing Restrictions: No       Mobility Bed Mobility   General bed mobility comments: up in chair  Transfers Overall transfer level: Needs assistance Equipment used: Rolling walker (2 wheeled) Transfers: Sit to/from Stand Sit to Stand: +2 physical assistance;Min assist        General transfer comment: Pt stood from recliner to walker and maintained 1 minute, placed pillow in chair as pt was complaining of his bottom being sore, assisted pt to place feet and then prevented his feet from sliding with NT and OTs feet in front of his.    Balance Overall balance assessment: Needs assistance Sitting-balance support: Feet supported Sitting balance-Leahy Scale: Fair       Standing balance-Leahy Scale: Poor                     ADL Overall ADL's : Needs assistance/impaired Eating/Feeding: Independent;Sitting   Grooming: Wash/dry hands;Wash/dry face;Brushing hair;Sitting;Minimal assistance Grooming Details (indicate cue type and reason): decreased  thoroughness, needs cues for task persistence                               General ADL Comments: UB ther ex with level 1 theraband x 10 reps--horizontal abd/add, shoulder flex/ext in sitting, able to recall exercises taught last session.Educated pt in sternal precautions in preparation for impending CABG.      Vision                     Perception     Praxis      Cognition   Behavior During Therapy: Flat affect Overall Cognitive Status: Impaired/Different from baseline Area of Impairment: Safety/judgement;Following commands;Problem solving;Attention   Current Attention Level: Sustained Memory: Decreased short-term memory  Following Commands: Follows one step commands with increased time Safety/Judgement: Decreased awareness of safety;Decreased awareness of deficits   Problem Solving: Slow processing;Difficulty sequencing General Comments: does better with less verbal cues    Extremity/Trunk Assessment               Exercises General Exercises - Lower Extremity Long Arc Quad: AROM;Seated;Both;20 reps Hip ABduction/ADduction: AROM;Seated;Both;20 reps Hip Flexion/Marching: AROM;Seated;Both;20 reps Toe Raises: AROM;Seated;Both;20 reps   Shoulder Instructions       General Comments      Pertinent Vitals/ Pain       Pain Assessment: No/denies pain  Home Living  Prior Functioning/Environment              Frequency Min 2X/week     Progress Toward Goals  OT Goals(current goals can now be found in the care plan section)  Progress towards OT goals: Progressing toward goals     Plan Discharge plan remains appropriate    Co-evaluation                 End of Session Equipment Utilized During Treatment: Rolling walker;Oxygen   Activity Tolerance Patient limited by fatigue   Patient Left in chair;with call bell/phone within reach;with chair alarm set;with  nursing/sitter in room;with family/visitor present   Nurse Communication          Time: 4643-1427 OT Time Calculation (min): 24 min  Charges: OT General Charges $OT Visit: 1 Procedure OT Treatments $Self Care/Home Management : 23-37 mins  [name] 10/22/2014, 11:30 AM  [phone]

## 2014-10-22 NOTE — Progress Notes (Signed)
Patient stated he does not wear a BIPAP nor a CPAP and does not want to wear one at this time.

## 2014-10-22 NOTE — Progress Notes (Signed)
Physical Therapy Treatment Patient Details Name: Travis Romero MRN: [id number] DOB: 05-21-1950 Today's Date: 10/22/2014    History of Present Illness Pt was admitted with progressive dyspnea and dx'd with CHF exacerbation.  He was intubated 7/8 - 7/11.  Pt has a h/o HTN, COPD, and back pain. Pt with AMS since extubation. Planned CABG 7/20    PT Comments    Pt continues to be confused regarding function and safety. Pt oriented to time, place and planned CABG tomorrow. Aravind lacks coordination of movement with transfers particularly standing and scooting back in chair. Practiced reciprocal scooting forward and back in attempt to isolate and improve movement. Continue to recommend squat pivot as transfer technique at this time.   Follow Up Recommendations  SNF;Supervision/Assistance - 24 hour     Equipment Recommendations  Wheelchair (measurements PT)    Recommendations for Other Services       Precautions / Restrictions Precautions Precautions: Fall Restrictions Weight Bearing Restrictions: No    Mobility  Bed Mobility Overal bed mobility: Needs Assistance Bed Mobility: Supine to Sit     Supine to sit: Min assist     General bed mobility comments: limited physical assist, heavy use of rail, with cues for sequence and elevation of trunk  Transfers Overall transfer level: Needs assistance   Transfers: Sit to/from Stand;Squat Pivot Transfers Sit to Stand: Max assist;+2 physical assistance   Squat pivot transfers: Max assist     General transfer comment: attempted sit to stand total of 4 attempts from bed with cues for hand placement, bed height elevated and with or without RW with use of pad and belt to assist with sacral elevation with bil legs blocked. Pt able to fully clear buttock but maintained posterior lean and crouched posture pushing away RW and without RW unable to faciliate anterior translation with assist at trunk and sacrum. Pt with squat pivot using  bobath technique bed to recliner  Ambulation/Gait                 Stairs            Wheelchair Mobility    Modified Rankin (Stroke Patients Only)       Balance Overall balance assessment: Needs assistance   Sitting balance-Leahy Scale: Fair       Standing balance-Leahy Scale: Zero                      Cognition Arousal/Alertness: Awake/alert Behavior During Therapy: Impulsive Overall Cognitive Status: Impaired/Different from baseline Area of Impairment: Safety/judgement;Following commands;Problem solving;Attention   Current Attention Level: Sustained Memory: Decreased short-term memory Following Commands: Follows one step commands inconsistently Safety/Judgement: Decreased awareness of safety;Decreased awareness of deficits   Problem Solving: Slow processing;Difficulty sequencing      Exercises General Exercises - Lower Extremity Long Arc Quad: AROM;Seated;Both;20 reps Hip ABduction/ADduction: AROM;Seated;Both;20 reps Hip Flexion/Marching: AROM;Seated;Both;20 reps Toe Raises: AROM;Seated;Both;20 reps    General Comments        Pertinent Vitals/Pain Pain Assessment: No/denies pain    Home Living                      Prior Function            PT Goals (current goals can now be found in the care plan section) Progress towards PT goals: Progressing toward goals (slowly limited by cognition and coordination)    Frequency  Min 3X/week    PT Plan Discharge plan needs to be updated  Co-evaluation             End of Session Equipment Utilized During Treatment: Gait belt;Oxygen Activity Tolerance: Patient tolerated treatment well Patient left: in chair;with call bell/phone within reach;with chair alarm set     Time: 7543-6067 PT Time Calculation (min) (ACUTE ONLY): 28 min  Charges:  $Therapeutic Exercise: 8-22 mins $Therapeutic Activity: 8-22 mins                    G Codes:      Melford Aase 10/22/2014, 10:32 AM Elwyn Reach, [place]

## 2014-10-22 NOTE — Progress Notes (Signed)
Creatinine improved today - holding lasix. Plan for CABG tomorrow with Dr. Roxan Hockey.  Pixie Casino, MD, [facility] Attending Cardiologist [place]

## 2014-10-23 ENCOUNTER — Inpatient Hospital Stay (HOSPITAL_COMMUNITY): Payer: PRIVATE HEALTH INSURANCE

## 2014-10-23 ENCOUNTER — Inpatient Hospital Stay (HOSPITAL_COMMUNITY): Payer: PRIVATE HEALTH INSURANCE | Admitting: Certified Registered"

## 2014-10-23 ENCOUNTER — Encounter (HOSPITAL_COMMUNITY)
Admission: EM | Disposition: A | Discharge status: Rehabilitation Facility | Payer: PRIVATE HEALTH INSURANCE | Source: Home / Self Care | Attending: Thoracic Surgery (Cardiothoracic Vascular Surgery)

## 2014-10-23 ENCOUNTER — Encounter (HOSPITAL_COMMUNITY): Payer: Self-pay | Admitting: Certified Registered"

## 2014-10-23 DIAGNOSIS — Z951 Presence of aortocoronary bypass graft: Secondary | ICD-10-CM

## 2014-10-23 HISTORY — PX: CORONARY ARTERY BYPASS GRAFT: SHX141

## 2014-10-23 HISTORY — PX: TEE WITHOUT CARDIOVERSION: SHX5443

## 2014-10-23 LAB — POCT I-STAT, CHEM 8
BUN: 29 mg/dL — ABNORMAL HIGH (ref 6–20)
BUN: 24 mg/dL — ABNORMAL HIGH (ref 6–20)
BUN: 28 mg/dL — ABNORMAL HIGH (ref 6–20)
BUN: 24 mg/dL — ABNORMAL HIGH (ref 6–20)
BUN: 24 mg/dL — ABNORMAL HIGH (ref 6–20)
CHLORIDE: 97 mmol/L — AB (ref 101–111)
CHLORIDE: 95 mmol/L — AB (ref 101–111)
Calcium, Ion: 1.34 mmol/L — ABNORMAL HIGH (ref 1.13–1.30)
Calcium, Ion: 1.19 mmol/L (ref 1.13–1.30)
Calcium, Ion: 1.33 mmol/L — ABNORMAL HIGH (ref 1.13–1.30)
Calcium, Ion: 1.17 mmol/L (ref 1.13–1.30)
Calcium, Ion: 1.16 mmol/L (ref 1.13–1.30)
Chloride: 96 mmol/L — ABNORMAL LOW (ref 101–111)
Chloride: 96 mmol/L — ABNORMAL LOW (ref 101–111)
Chloride: 96 mmol/L — ABNORMAL LOW (ref 101–111)
Creatinine, Ser: 1.2 mg/dL (ref 0.61–1.24)
Creatinine, Ser: 1.1 mg/dL (ref 0.61–1.24)
Creatinine, Ser: 1.1 mg/dL (ref 0.61–1.24)
Creatinine, Ser: 1.1 mg/dL (ref 0.61–1.24)
Creatinine, Ser: 1 mg/dL (ref 0.61–1.24)
GLUCOSE: 152 mg/dL — AB (ref 65–99)
GLUCOSE: 137 mg/dL — AB (ref 65–99)
GLUCOSE: 158 mg/dL — AB (ref 65–99)
Glucose, Bld: 102 mg/dL — ABNORMAL HIGH (ref 65–99)
Glucose, Bld: 133 mg/dL — ABNORMAL HIGH (ref 65–99)
HCT: 40 % (ref 39.0–52.0)
HCT: 32 % — ABNORMAL LOW (ref 39.0–52.0)
HCT: 40 % (ref 39.0–52.0)
HCT: 29 % — ABNORMAL LOW (ref 39.0–52.0)
HEMATOCRIT: 28 % — AB (ref 39.0–52.0)
HEMOGLOBIN: 9.5 g/dL — AB (ref 13.0–17.0)
Hemoglobin: 13.6 g/dL (ref 13.0–17.0)
Hemoglobin: 10.9 g/dL — ABNORMAL LOW (ref 13.0–17.0)
Hemoglobin: 13.6 g/dL (ref 13.0–17.0)
Hemoglobin: 9.9 g/dL — ABNORMAL LOW (ref 13.0–17.0)
Potassium: 4.8 mmol/L (ref 3.5–5.1)
Potassium: 4.6 mmol/L (ref 3.5–5.1)
Potassium: 5.3 mmol/L — ABNORMAL HIGH (ref 3.5–5.1)
Potassium: 5.8 mmol/L — ABNORMAL HIGH (ref 3.5–5.1)
Potassium: 4.9 mmol/L (ref 3.5–5.1)
SODIUM: 127 mmol/L — AB (ref 135–145)
SODIUM: 131 mmol/L — AB (ref 135–145)
Sodium: 130 mmol/L — ABNORMAL LOW (ref 135–145)
Sodium: 130 mmol/L — ABNORMAL LOW (ref 135–145)
Sodium: 131 mmol/L — ABNORMAL LOW (ref 135–145)
TCO2: 26 mmol/L (ref 0–100)
TCO2: 26 mmol/L (ref 0–100)
TCO2: 26 mmol/L (ref 0–100)
TCO2: 25 mmol/L (ref 0–100)
TCO2: 24 mmol/L (ref 0–100)

## 2014-10-23 LAB — CBC
HCT: 43.2 % (ref 39.0–52.0)
HCT: 32.6 % — ABNORMAL LOW (ref 39.0–52.0)
HEMATOCRIT: 34.1 % — AB (ref 39.0–52.0)
HEMOGLOBIN: 14.1 g/dL (ref 13.0–17.0)
Hemoglobin: 11.2 g/dL — ABNORMAL LOW (ref 13.0–17.0)
Hemoglobin: 10.7 g/dL — ABNORMAL LOW (ref 13.0–17.0)
MCH: 35.6 pg — ABNORMAL HIGH (ref 26.0–34.0)
MCH: 35.3 pg — ABNORMAL HIGH (ref 26.0–34.0)
MCH: 34.9 pg — AB (ref 26.0–34.0)
MCHC: 32.6 g/dL (ref 30.0–36.0)
MCHC: 32.8 g/dL (ref 30.0–36.0)
MCHC: 32.8 g/dL (ref 30.0–36.0)
MCV: 109.1 fL — ABNORMAL HIGH (ref 78.0–100.0)
MCV: 107.6 fL — AB (ref 78.0–100.0)
MCV: 106.2 fL — ABNORMAL HIGH (ref 78.0–100.0)
PLATELETS: 266 10*3/uL (ref 150–400)
Platelets: 368 10*3/uL (ref 150–400)
Platelets: 292 10*3/uL (ref 150–400)
RBC: 3.96 MIL/uL — AB (ref 4.22–5.81)
RBC: 3.17 MIL/uL — ABNORMAL LOW (ref 4.22–5.81)
RBC: 3.07 MIL/uL — ABNORMAL LOW (ref 4.22–5.81)
RDW: 13.7 % (ref 11.5–15.5)
RDW: 13.7 % (ref 11.5–15.5)
RDW: 13.7 % (ref 11.5–15.5)
WBC: 10.7 10*3/uL — ABNORMAL HIGH (ref 4.0–10.5)
WBC: 29.3 10*3/uL — AB (ref 4.0–10.5)
WBC: 23.9 10*3/uL — ABNORMAL HIGH (ref 4.0–10.5)

## 2014-10-23 LAB — POCT I-STAT 3, ART BLOOD GAS (G3+)
Acid-Base Excess: 1 mmol/L (ref 0.0–2.0)
Acid-base deficit: 2 mmol/L (ref 0.0–2.0)
Acid-base deficit: 4 mmol/L — ABNORMAL HIGH (ref 0.0–2.0)
Acid-base deficit: 1 mmol/L (ref 0.0–2.0)
BICARBONATE: 23.2 meq/L (ref 20.0–24.0)
Bicarbonate: 28 mEq/L — ABNORMAL HIGH (ref 20.0–24.0)
Bicarbonate: 24.8 mEq/L — ABNORMAL HIGH (ref 20.0–24.0)
Bicarbonate: 24.9 mEq/L — ABNORMAL HIGH (ref 20.0–24.0)
O2 SAT: 100 %
O2 SAT: 100 %
O2 Saturation: 92 %
O2 Saturation: 75 %
PH ART: 7.352 (ref 7.350–7.450)
PO2 ART: 282 mmHg — AB (ref 80.0–100.0)
PO2 ART: 193 mmHg — AB (ref 80.0–100.0)
Patient temperature: 36.7
TCO2: 30 mmol/L (ref 0–100)
TCO2: 26 mmol/L (ref 0–100)
TCO2: 25 mmol/L (ref 0–100)
TCO2: 26 mmol/L (ref 0–100)
pCO2 arterial: 56.8 mmHg — ABNORMAL HIGH (ref 35.0–45.0)
pCO2 arterial: 48.6 mmHg — ABNORMAL HIGH (ref 35.0–45.0)
pCO2 arterial: 49.8 mmHg — ABNORMAL HIGH (ref 35.0–45.0)
pCO2 arterial: 44.9 mmHg (ref 35.0–45.0)
pH, Arterial: 7.301 — ABNORMAL LOW (ref 7.350–7.450)
pH, Arterial: 7.317 — ABNORMAL LOW (ref 7.350–7.450)
pH, Arterial: 7.273 — ABNORMAL LOW (ref 7.350–7.450)
pO2, Arterial: 70 mmHg — ABNORMAL LOW (ref 80.0–100.0)
pO2, Arterial: 42 mmHg — ABNORMAL LOW (ref 80.0–100.0)

## 2014-10-23 LAB — SURGICAL PCR SCREEN
MRSA, PCR: NEGATIVE
STAPHYLOCOCCUS AUREUS: NEGATIVE

## 2014-10-23 LAB — CREATININE, SERUM: CREATININE: 1 mg/dL (ref 0.61–1.24)

## 2014-10-23 LAB — POCT I-STAT 4, (NA,K, GLUC, HGB,HCT)
GLUCOSE: 135 mg/dL — AB (ref 65–99)
HCT: 35 % — ABNORMAL LOW (ref 39.0–52.0)
HEMOGLOBIN: 11.9 g/dL — AB (ref 13.0–17.0)
POTASSIUM: 4.9 mmol/L (ref 3.5–5.1)
SODIUM: 130 mmol/L — AB (ref 135–145)

## 2014-10-23 LAB — APTT: aPTT: 33 seconds (ref 24–37)

## 2014-10-23 LAB — PLATELET COUNT: Platelets: 257 10*3/uL (ref 150–400)

## 2014-10-23 LAB — PROTIME-INR
INR: 1.23 (ref 0.00–1.49)
PROTHROMBIN TIME: 15.6 s — AB (ref 11.6–15.2)

## 2014-10-23 LAB — HEMOGLOBIN A1C
HEMOGLOBIN A1C: 5.9 % — AB (ref 4.8–5.6)
MEAN PLASMA GLUCOSE: 123 mg/dL

## 2014-10-23 LAB — HEMOGLOBIN AND HEMATOCRIT, BLOOD
HCT: 30.2 % — ABNORMAL LOW (ref 39.0–52.0)
HEMOGLOBIN: 9.8 g/dL — AB (ref 13.0–17.0)

## 2014-10-23 LAB — MAGNESIUM: Magnesium: 3.2 mg/dL — ABNORMAL HIGH (ref 1.7–2.4)

## 2014-10-23 LAB — POCT I-STAT GLUCOSE
Glucose, Bld: 157 mg/dL — ABNORMAL HIGH (ref 65–99)
Operator id: 156951

## 2014-10-23 SURGERY — CORONARY ARTERY BYPASS GRAFTING (CABG)
Anesthesia: General | Site: Chest

## 2014-10-23 MED ORDER — PHENYLEPHRINE HCL 10 MG/ML IJ SOLN: 20.0000 mg | INTRAVENOUS | Status: DC | PRN

## 2014-10-23 MED ORDER — MAGNESIUM SULFATE 4 GM/100ML IV SOLN
4.0000 g | Freq: Once | INTRAVENOUS | Status: AC
  Administered 2014-10-23: 4 g via INTRAVENOUS
  Filled 2014-10-23: qty 100

## 2014-10-23 MED ORDER — NITROGLYCERIN IN D5W 200-5 MCG/ML-% IV SOLN
0.0000 ug/min | INTRAVENOUS | Status: DC
Start: 2014-10-23 — End: 2014-10-26

## 2014-10-23 MED ORDER — SODIUM BICARBONATE 8.4 % IV SOLN
50.0000 meq | Freq: Once | INTRAVENOUS | Status: AC
  Administered 2014-10-23: 50 meq via INTRAVENOUS
  Filled 2014-10-23: qty 50

## 2014-10-23 MED ORDER — PROPOFOL 10 MG/ML IV BOLUS
INTRAVENOUS | Status: AC
  Filled 2014-10-23: qty 20

## 2014-10-23 MED ORDER — EPINEPHRINE HCL 0.1 MG/ML IJ SOSY
PREFILLED_SYRINGE | INTRAMUSCULAR | Status: DC | PRN
  Administered 2014-10-23: .5 mL via INTRAVENOUS

## 2014-10-23 MED ORDER — PANTOPRAZOLE SODIUM 40 MG PO TBEC
40.0000 mg | DELAYED_RELEASE_TABLET | Freq: Every day | ORAL | Status: DC
  Administered 2014-10-25 – 2014-11-04 (×11): 40 mg via ORAL
  Filled 2014-10-23 (×11): qty 1

## 2014-10-23 MED ORDER — METOPROLOL TARTRATE 25 MG/10 ML ORAL SUSPENSION
12.5000 mg | Freq: Two times a day (BID) | ORAL | Status: DC
  Filled 2014-10-23 (×5): qty 5

## 2014-10-23 MED ORDER — ONDANSETRON HCL 4 MG/2ML IJ SOLN: 4.0000 mg | Freq: Four times a day (QID) | INTRAMUSCULAR | Status: DC | PRN

## 2014-10-23 MED ORDER — DEXTROSE 5 % IV SOLN
1.5000 g | Freq: Two times a day (BID) | INTRAVENOUS | Status: AC
  Administered 2014-10-23 – 2014-10-25 (×4): 1.5 g via INTRAVENOUS
  Filled 2014-10-23 (×4): qty 1.5

## 2014-10-23 MED ORDER — ALBUMIN HUMAN 5 % IV SOLN
INTRAVENOUS | Status: DC | PRN
  Administered 2014-10-23 (×3): via INTRAVENOUS

## 2014-10-23 MED ORDER — DEXMEDETOMIDINE HCL IN NACL 200 MCG/50ML IV SOLN
INTRAVENOUS | Status: DC | PRN
  Administered 2014-10-23: .3 ug/kg/h via INTRAVENOUS

## 2014-10-23 MED ORDER — ACETAMINOPHEN 160 MG/5ML PO SOLN: 650.0000 mg | Freq: Once | ORAL | Status: AC

## 2014-10-23 MED ORDER — VECURONIUM BROMIDE 10 MG IV SOLR
INTRAVENOUS | Status: DC | PRN
  Administered 2014-10-23: 10 mg via INTRAVENOUS
  Administered 2014-10-23: 5 mg via INTRAVENOUS
  Administered 2014-10-23: 4 mg via INTRAVENOUS
  Administered 2014-10-23: 2 mg via INTRAVENOUS
  Administered 2014-10-23: 4 mg via INTRAVENOUS
  Administered 2014-10-23: 5 mg via INTRAVENOUS

## 2014-10-23 MED ORDER — SUFENTANIL CITRATE 250 MCG/5ML IV SOLN
INTRAVENOUS | Status: AC
  Filled 2014-10-23: qty 5

## 2014-10-23 MED ORDER — NOREPINEPHRINE BITARTRATE 1 MG/ML IV SOLN
0.0000 ug/min | INTRAVENOUS | Status: AC
  Administered 2014-10-23: 5 ug/min via INTRAVENOUS
  Filled 2014-10-23: qty 4

## 2014-10-23 MED ORDER — PROPOFOL 10 MG/ML IV BOLUS
INTRAVENOUS | Status: DC | PRN
  Administered 2014-10-23: 80 mg via INTRAVENOUS

## 2014-10-23 MED ORDER — MILRINONE IN DEXTROSE 20 MG/100ML IV SOLN
0.1250 ug/kg/min | INTRAVENOUS | Status: AC
  Administered 2014-10-23: .3 ug/kg/min via INTRAVENOUS
  Filled 2014-10-23: qty 100

## 2014-10-23 MED ORDER — SODIUM CHLORIDE 0.9 % IV SOLN
10.0000 g | INTRAVENOUS | Status: DC | PRN
  Administered 2014-10-23: 5 g/h via INTRAVENOUS

## 2014-10-23 MED ORDER — DOPAMINE-DEXTROSE 3.2-5 MG/ML-% IV SOLN: 0.0000 ug/kg/min | INTRAVENOUS | Status: DC

## 2014-10-23 MED ORDER — LACTATED RINGERS IV SOLN
INTRAVENOUS | Status: DC
  Administered 2014-10-23: 15:00:00 via INTRAVENOUS

## 2014-10-23 MED ORDER — VECURONIUM BROMIDE 10 MG IV SOLR
INTRAVENOUS | Status: AC
  Filled 2014-10-23: qty 10

## 2014-10-23 MED ORDER — LACTATED RINGERS IV SOLN: INTRAVENOUS | Status: DC

## 2014-10-23 MED ORDER — VANCOMYCIN HCL IN DEXTROSE 1-5 GM/200ML-% IV SOLN
1000.0000 mg | Freq: Once | INTRAVENOUS | Status: AC
  Administered 2014-10-23: 1000 mg via INTRAVENOUS
  Filled 2014-10-23: qty 200

## 2014-10-23 MED ORDER — SODIUM CHLORIDE 0.9 % IV SOLN
INTRAVENOUS | Status: DC
Start: 2014-10-23 — End: 2014-10-26
  Administered 2014-10-23: 15:00:00 via INTRAVENOUS

## 2014-10-23 MED ORDER — ACETAMINOPHEN 160 MG/5ML PO SOLN
1000.0000 mg | Freq: Four times a day (QID) | ORAL | Status: AC
  Administered 2014-10-24: 1000 mg

## 2014-10-23 MED ORDER — ASPIRIN EC 325 MG PO TBEC
325.0000 mg | DELAYED_RELEASE_TABLET | Freq: Every day | ORAL | Status: DC
  Administered 2014-10-24 – 2014-11-04 (×12): 325 mg via ORAL
  Filled 2014-10-23 (×12): qty 1

## 2014-10-23 MED ORDER — SODIUM CHLORIDE 0.9 % IJ SOLN
3.0000 mL | INTRAMUSCULAR | Status: DC | PRN
  Administered 2014-10-28: 3 mL via INTRAVENOUS
  Filled 2014-10-23: qty 3

## 2014-10-23 MED ORDER — PHENYLEPHRINE HCL 10 MG/ML IJ SOLN
10.0000 mg | INTRAVENOUS | Status: DC | PRN
  Administered 2014-10-23: 150 ug/min via INTRAVENOUS
  Administered 2014-10-23: 30 ug/min via INTRAVENOUS

## 2014-10-23 MED ORDER — BISACODYL 5 MG PO TBEC
10.0000 mg | DELAYED_RELEASE_TABLET | Freq: Every day | ORAL | Status: DC
  Administered 2014-10-24 – 2014-11-04 (×9): 10 mg via ORAL
  Filled 2014-10-23 (×9): qty 2

## 2014-10-23 MED ORDER — POTASSIUM CHLORIDE 10 MEQ/50ML IV SOLN: 10.0000 meq | INTRAVENOUS | Status: AC

## 2014-10-23 MED ORDER — EPINEPHRINE HCL 0.1 MG/ML IJ SOSY
PREFILLED_SYRINGE | INTRAMUSCULAR | Status: AC
  Filled 2014-10-23: qty 10

## 2014-10-23 MED ORDER — EPINEPHRINE HCL 1 MG/ML IJ SOLN: INTRAMUSCULAR | Status: DC | PRN

## 2014-10-23 MED ORDER — SODIUM CHLORIDE 0.9 % IJ SOLN
3.0000 mL | Freq: Two times a day (BID) | INTRAMUSCULAR | Status: DC
  Administered 2014-10-24 – 2014-11-02 (×14): 3 mL via INTRAVENOUS

## 2014-10-23 MED ORDER — 0.9 % SODIUM CHLORIDE (POUR BTL) OPTIME
TOPICAL | Status: DC | PRN
  Administered 2014-10-23: 2000 mL

## 2014-10-23 MED ORDER — MORPHINE SULFATE 2 MG/ML IJ SOLN
2.0000 mg | INTRAMUSCULAR | Status: DC | PRN
  Administered 2014-10-23 – 2014-10-24 (×2): 2 mg via INTRAVENOUS
  Administered 2014-10-24 (×2): 4 mg via INTRAVENOUS
  Administered 2014-10-24 (×2): 2 mg via INTRAVENOUS
  Filled 2014-10-23 (×2): qty 2

## 2014-10-23 MED ORDER — ASPIRIN 81 MG PO CHEW
324.0000 mg | CHEWABLE_TABLET | Freq: Every day | ORAL | Status: DC
  Filled 2014-10-23: qty 4

## 2014-10-23 MED ORDER — MILRINONE IN DEXTROSE 20 MG/100ML IV SOLN: 0.3000 ug/kg/min | INTRAVENOUS | Status: DC

## 2014-10-23 MED ORDER — HEMOSTATIC AGENTS (NO CHARGE) OPTIME
TOPICAL | Status: DC | PRN
  Administered 2014-10-23: 3 via TOPICAL

## 2014-10-23 MED ORDER — OXYCODONE HCL 5 MG PO TABS
5.0000 mg | ORAL_TABLET | ORAL | Status: DC | PRN
  Administered 2014-10-24 – 2014-10-26 (×10): 10 mg via ORAL
  Administered 2014-10-26 (×3): 5 mg via ORAL
  Administered 2014-10-26 – 2014-10-28 (×11): 10 mg via ORAL
  Filled 2014-10-23 (×4): qty 2
  Filled 2014-10-23: qty 1
  Filled 2014-10-23 (×3): qty 2
  Filled 2014-10-23: qty 1
  Filled 2014-10-23 (×4): qty 2
  Filled 2014-10-23: qty 1
  Filled 2014-10-23 (×10): qty 2

## 2014-10-23 MED ORDER — HEPARIN SODIUM (PORCINE) 1000 UNIT/ML IJ SOLN
INTRAMUSCULAR | Status: AC
  Filled 2014-10-23: qty 1

## 2014-10-23 MED ORDER — LACTATED RINGERS IV SOLN
INTRAVENOUS | Status: DC | PRN
  Administered 2014-10-23 (×2): via INTRAVENOUS

## 2014-10-23 MED ORDER — MIDAZOLAM HCL 2 MG/2ML IJ SOLN: 2.0000 mg | INTRAMUSCULAR | Status: DC | PRN

## 2014-10-23 MED ORDER — ARTIFICIAL TEARS OP OINT
TOPICAL_OINTMENT | OPHTHALMIC | Status: DC | PRN
  Administered 2014-10-23: 1 via OPHTHALMIC

## 2014-10-23 MED ORDER — EPHEDRINE SULFATE 50 MG/ML IJ SOLN
INTRAMUSCULAR | Status: AC
  Filled 2014-10-23: qty 1

## 2014-10-23 MED ORDER — MORPHINE SULFATE 2 MG/ML IJ SOLN
1.0000 mg | INTRAMUSCULAR | Status: AC | PRN
  Administered 2014-10-23 (×4): 2 mg via INTRAVENOUS
  Filled 2014-10-23 (×4): qty 2

## 2014-10-23 MED ORDER — ACETAMINOPHEN 650 MG RE SUPP
650.0000 mg | Freq: Once | RECTAL | Status: AC
  Administered 2014-10-23: 650 mg via RECTAL

## 2014-10-23 MED ORDER — ALBUMIN HUMAN 5 % IV SOLN
250.0000 mL | INTRAVENOUS | Status: AC | PRN
  Administered 2014-10-23 (×2): 250 mL via INTRAVENOUS

## 2014-10-23 MED ORDER — METOPROLOL TARTRATE 1 MG/ML IV SOLN
2.5000 mg | INTRAVENOUS | Status: DC | PRN
  Administered 2014-10-27 – 2014-10-28 (×6): 5 mg via INTRAVENOUS
  Filled 2014-10-23 (×6): qty 5

## 2014-10-23 MED ORDER — MIDAZOLAM HCL 10 MG/2ML IJ SOLN
INTRAMUSCULAR | Status: AC
  Filled 2014-10-23: qty 2

## 2014-10-23 MED ORDER — PROTAMINE SULFATE 10 MG/ML IV SOLN
INTRAVENOUS | Status: AC
  Filled 2014-10-23: qty 25

## 2014-10-23 MED ORDER — MIDAZOLAM HCL 2 MG/2ML IJ SOLN
INTRAMUSCULAR | Status: AC
  Filled 2014-10-23: qty 2

## 2014-10-23 MED ORDER — LEVALBUTEROL HCL 0.63 MG/3ML IN NEBU: 0.6300 mg | INHALATION_SOLUTION | Freq: Four times a day (QID) | RESPIRATORY_TRACT | Status: DC

## 2014-10-23 MED ORDER — DOCUSATE SODIUM 100 MG PO CAPS
200.0000 mg | ORAL_CAPSULE | Freq: Every day | ORAL | Status: DC
  Administered 2014-10-24 – 2014-11-04 (×10): 200 mg via ORAL
  Filled 2014-10-23 (×10): qty 2

## 2014-10-23 MED ORDER — SODIUM CHLORIDE 0.9 % IV SOLN: 250.0000 mL | INTRAVENOUS | Status: DC

## 2014-10-23 MED ORDER — SUFENTANIL CITRATE 50 MCG/ML IV SOLN
INTRAVENOUS | Status: DC | PRN
  Administered 2014-10-23: 5 ug via INTRAVENOUS
  Administered 2014-10-23: 20 ug via INTRAVENOUS
  Administered 2014-10-23: 25 ug via INTRAVENOUS
  Administered 2014-10-23: 20 ug via INTRAVENOUS

## 2014-10-23 MED ORDER — METOPROLOL TARTRATE 12.5 MG HALF TABLET
12.5000 mg | ORAL_TABLET | Freq: Two times a day (BID) | ORAL | Status: DC
  Administered 2014-10-24 (×2): 12.5 mg via ORAL
  Filled 2014-10-23 (×5): qty 1

## 2014-10-23 MED ORDER — HEPARIN SODIUM (PORCINE) 1000 UNIT/ML IJ SOLN
INTRAMUSCULAR | Status: DC | PRN
  Administered 2014-10-23: 2000 [IU] via INTRAVENOUS
  Administered 2014-10-23: 31000 [IU] via INTRAVENOUS

## 2014-10-23 MED ORDER — LEVALBUTEROL HCL 0.63 MG/3ML IN NEBU
0.6300 mg | INHALATION_SOLUTION | Freq: Four times a day (QID) | RESPIRATORY_TRACT | Status: DC
  Administered 2014-10-23 – 2014-10-25 (×7): 0.63 mg via RESPIRATORY_TRACT
  Filled 2014-10-23 (×14): qty 3

## 2014-10-23 MED ORDER — SODIUM BICARBONATE 8.4 % IV SOLN
INTRAVENOUS | Status: AC
  Filled 2014-10-23: qty 50

## 2014-10-23 MED ORDER — PHENYLEPHRINE HCL 10 MG/ML IJ SOLN
0.0000 ug/min | INTRAMUSCULAR | Status: DC
  Administered 2014-10-23: 20 ug/min via INTRAVENOUS
  Filled 2014-10-23: qty 2

## 2014-10-23 MED ORDER — SODIUM CHLORIDE 0.45 % IV SOLN
INTRAVENOUS | Status: DC | PRN
  Administered 2014-10-23 – 2014-10-25 (×2): via INTRAVENOUS

## 2014-10-23 MED ORDER — HEMOSTATIC AGENTS (NO CHARGE) OPTIME
TOPICAL | Status: DC | PRN
  Administered 2014-10-23: 1 via TOPICAL

## 2014-10-23 MED ORDER — PROTAMINE SULFATE 10 MG/ML IV SOLN
INTRAVENOUS | Status: DC | PRN
  Administered 2014-10-23: 10 mg via INTRAVENOUS
  Administered 2014-10-23: 240 mg via INTRAVENOUS

## 2014-10-23 MED ORDER — SODIUM CHLORIDE 0.9 % IV SOLN
INTRAVENOUS | Status: DC
  Administered 2014-10-23: 2.5 [IU]/h via INTRAVENOUS
  Filled 2014-10-23: qty 2.5

## 2014-10-23 MED ORDER — DEXMEDETOMIDINE HCL IN NACL 200 MCG/50ML IV SOLN
0.0000 ug/kg/h | INTRAVENOUS | Status: DC
  Administered 2014-10-23: 0.3 ug/kg/h via INTRAVENOUS
  Filled 2014-10-23: qty 50

## 2014-10-23 MED ORDER — LACTATED RINGERS IV SOLN
INTRAVENOUS | Status: DC | PRN
  Administered 2014-10-23: 08:00:00 via INTRAVENOUS

## 2014-10-23 MED ORDER — MIDAZOLAM HCL 5 MG/5ML IJ SOLN
INTRAMUSCULAR | Status: DC | PRN
  Administered 2014-10-23 (×2): 2 mg via INTRAVENOUS
  Administered 2014-10-23: 4 mg via INTRAVENOUS
  Administered 2014-10-23 (×2): 2 mg via INTRAVENOUS

## 2014-10-23 MED ORDER — LACTATED RINGERS IV SOLN
500.0000 mL | Freq: Once | INTRAVENOUS | Status: AC | PRN
  Administered 2014-10-23: 500 mL via INTRAVENOUS

## 2014-10-23 MED ORDER — ACETAMINOPHEN 500 MG PO TABS
1000.0000 mg | ORAL_TABLET | Freq: Four times a day (QID) | ORAL | Status: AC
  Administered 2014-10-24 – 2014-10-28 (×18): 1000 mg via ORAL
  Filled 2014-10-23 (×18): qty 2

## 2014-10-23 MED ORDER — LACTATED RINGERS IV SOLN
INTRAVENOUS | Status: DC | PRN
  Administered 2014-10-23 (×2): via INTRAVENOUS

## 2014-10-23 MED ORDER — BISACODYL 10 MG RE SUPP: 10.0000 mg | Freq: Every day | RECTAL | Status: DC

## 2014-10-23 MED ORDER — FAMOTIDINE IN NACL 20-0.9 MG/50ML-% IV SOLN
20.0000 mg | Freq: Two times a day (BID) | INTRAVENOUS | Status: AC
  Administered 2014-10-23 (×2): 20 mg via INTRAVENOUS
  Filled 2014-10-23: qty 50

## 2014-10-23 MED ORDER — INSULIN REGULAR BOLUS VIA INFUSION
0.0000 [IU] | Freq: Three times a day (TID) | INTRAVENOUS | Status: DC
  Filled 2014-10-23: qty 10

## 2014-10-23 MED ORDER — ARTIFICIAL TEARS OP OINT
TOPICAL_OINTMENT | OPHTHALMIC | Status: AC
  Filled 2014-10-23: qty 3.5

## 2014-10-23 MED ORDER — SODIUM CHLORIDE 0.9 % IV SOLN
500.0000 mg | INTRAVENOUS | Status: DC
  Filled 2014-10-23: qty 500

## 2014-10-23 MED ORDER — TRAMADOL HCL 50 MG PO TABS
50.0000 mg | ORAL_TABLET | ORAL | Status: DC | PRN
  Administered 2014-10-24 – 2014-10-28 (×6): 100 mg via ORAL
  Administered 2014-10-29: 50 mg via ORAL
  Administered 2014-10-29 – 2014-10-30 (×5): 100 mg via ORAL
  Administered 2014-10-31: 50 mg via ORAL
  Administered 2014-10-31 – 2014-11-03 (×9): 100 mg via ORAL
  Administered 2014-11-03: 50 mg via ORAL
  Administered 2014-11-03: 100 mg via ORAL
  Administered 2014-11-04: 50 mg via ORAL
  Filled 2014-10-23: qty 2
  Filled 2014-10-23: qty 1
  Filled 2014-10-23 (×3): qty 2
  Filled 2014-10-23: qty 1
  Filled 2014-10-23 (×3): qty 2
  Filled 2014-10-23: qty 1
  Filled 2014-10-23 (×5): qty 2
  Filled 2014-10-23: qty 1
  Filled 2014-10-23 (×2): qty 2
  Filled 2014-10-23: qty 1
  Filled 2014-10-23 (×7): qty 2

## 2014-10-23 MED ORDER — INSULIN REGULAR HUMAN 100 UNIT/ML IJ SOLN
250.0000 [IU] | INTRAMUSCULAR | Status: DC | PRN
  Administered 2014-10-23: 1.8 [IU]/h via INTRAVENOUS

## 2014-10-23 SURGICAL SUPPLY — 83 items
BAG DECANTER FOR FLEXI CONT (MISCELLANEOUS) ×4 IMPLANT
BANDAGE ELASTIC 4 VELCRO ST LF (GAUZE/BANDAGES/DRESSINGS) ×4 IMPLANT
BANDAGE ELASTIC 6 VELCRO ST LF (GAUZE/BANDAGES/DRESSINGS) ×4 IMPLANT
BASKET HEART  (ORDER IN 25'S) (MISCELLANEOUS) ×1
BASKET HEART (ORDER IN 25'S) (MISCELLANEOUS) ×1
BASKET HEART (ORDER IN 25S) (MISCELLANEOUS) ×2 IMPLANT
BLADE STERNUM SYSTEM 6 (BLADE) ×4 IMPLANT
BNDG GAUZE ELAST 4 BULKY (GAUZE/BANDAGES/DRESSINGS) ×4 IMPLANT
CANISTER SUCTION 2500CC (MISCELLANEOUS) ×4 IMPLANT
CANNULA EZ GLIDE AORTIC 21FR (CANNULA) ×4 IMPLANT
CATH CPB KIT HENDRICKSON (MISCELLANEOUS) ×4 IMPLANT
CATH ROBINSON RED A/P 18FR (CATHETERS) ×4 IMPLANT
CATH THORACIC 36FR (CATHETERS) ×4 IMPLANT
CATH THORACIC 36FR RT ANG (CATHETERS) ×4 IMPLANT
CLIP TI MEDIUM 24 (CLIP) IMPLANT
CLIP TI WIDE RED SMALL 24 (CLIP) ×4 IMPLANT
CRADLE DONUT ADULT HEAD (MISCELLANEOUS) ×4 IMPLANT
DRAPE CARDIOVASCULAR INCISE (DRAPES) ×2
DRAPE SLUSH/WARMER DISC (DRAPES) ×4 IMPLANT
DRAPE SRG 135X102X78XABS (DRAPES) ×2 IMPLANT
DRSG COVADERM 4X14 (GAUZE/BANDAGES/DRESSINGS) ×4 IMPLANT
ELECT BLADE 6.5 EXT (BLADE) ×4 IMPLANT
ELECT REM PT RETURN 9FT ADLT (ELECTROSURGICAL) ×8
ELECTRODE REM PT RTRN 9FT ADLT (ELECTROSURGICAL) ×4 IMPLANT
GAUZE SPONGE 4X4 12PLY STRL (GAUZE/BANDAGES/DRESSINGS) ×8 IMPLANT
GLOVE SURG SIGNA 7.5 PF LTX (GLOVE) ×12 IMPLANT
GOWN STRL REUS W/ TWL LRG LVL3 (GOWN DISPOSABLE) ×8 IMPLANT
GOWN STRL REUS W/ TWL XL LVL3 (GOWN DISPOSABLE) ×4 IMPLANT
GOWN STRL REUS W/TWL LRG LVL3 (GOWN DISPOSABLE) ×8
GOWN STRL REUS W/TWL XL LVL3 (GOWN DISPOSABLE) ×4
HEMOSTAT POWDER SURGIFOAM 1G (HEMOSTASIS) ×12 IMPLANT
HEMOSTAT SURGICEL 2X14 (HEMOSTASIS) ×4 IMPLANT
INSERT FOGARTY XLG (MISCELLANEOUS) IMPLANT
KIT BASIN OR (CUSTOM PROCEDURE TRAY) ×4 IMPLANT
KIT ROOM TURNOVER OR (KITS) ×4 IMPLANT
KIT SUCTION CATH 14FR (SUCTIONS) ×8 IMPLANT
KIT VASOVIEW W/TROCAR VH 2000 (KITS) ×4 IMPLANT
MARKER GRAFT CORONARY BYPASS (MISCELLANEOUS) ×12 IMPLANT
NS IRRIG 1000ML POUR BTL (IV SOLUTION) ×20 IMPLANT
PACK OPEN HEART (CUSTOM PROCEDURE TRAY) ×4 IMPLANT
PAD ARMBOARD 7.5X6 YLW CONV (MISCELLANEOUS) ×8 IMPLANT
PAD ELECT DEFIB RADIOL ZOLL (MISCELLANEOUS) ×4 IMPLANT
PENCIL BUTTON HOLSTER BLD 10FT (ELECTRODE) ×4 IMPLANT
PUNCH AORTIC ROTATE 4.0MM (MISCELLANEOUS) IMPLANT
PUNCH AORTIC ROTATE 4.5MM 8IN (MISCELLANEOUS) ×4 IMPLANT
PUNCH AORTIC ROTATE 5MM 8IN (MISCELLANEOUS) IMPLANT
SPONGE GAUZE 4X4 12PLY STER LF (GAUZE/BANDAGES/DRESSINGS) ×8 IMPLANT
SPONGE INTESTINAL PEANUT (DISPOSABLE) ×4 IMPLANT
SUT BONE WAX W31G (SUTURE) ×4 IMPLANT
SUT ETHIBOND 2 0 SH (SUTURE) ×2
SUT ETHIBOND 2 0 SH 36X2 (SUTURE) ×2 IMPLANT
SUT MNCRL AB 4-0 PS2 18 (SUTURE) IMPLANT
SUT PROLENE 3 0 SH DA (SUTURE) ×4 IMPLANT
SUT PROLENE 4 0 RB 1 (SUTURE)
SUT PROLENE 4 0 SH DA (SUTURE) IMPLANT
SUT PROLENE 4-0 RB1 .5 CRCL 36 (SUTURE) IMPLANT
SUT PROLENE 6 0 C 1 30 (SUTURE) ×12 IMPLANT
SUT PROLENE 7 0 BV 1 (SUTURE) ×12 IMPLANT
SUT PROLENE 7 0 BV1 MDA (SUTURE) ×12 IMPLANT
SUT PROLENE 8 0 BV175 6 (SUTURE) IMPLANT
SUT STEEL 6MS V (SUTURE) ×4 IMPLANT
SUT STEEL STERNAL CCS#1 18IN (SUTURE) IMPLANT
SUT STEEL SZ 6 DBL 3X14 BALL (SUTURE) ×4 IMPLANT
SUT VIC AB 1 CTX 36 (SUTURE) ×4
SUT VIC AB 1 CTX36XBRD ANBCTR (SUTURE) ×4 IMPLANT
SUT VIC AB 2-0 CT1 27 (SUTURE)
SUT VIC AB 2-0 CT1 TAPERPNT 27 (SUTURE) IMPLANT
SUT VIC AB 2-0 CTX 27 (SUTURE) IMPLANT
SUT VIC AB 3-0 SH 27 (SUTURE)
SUT VIC AB 3-0 SH 27X BRD (SUTURE) IMPLANT
SUT VIC AB 3-0 X1 27 (SUTURE) ×4 IMPLANT
SUT VICRYL 4-0 PS2 18IN ABS (SUTURE) IMPLANT
SUTURE E-PAK OPEN HEART (SUTURE) ×4 IMPLANT
SYSTEM SAHARA CHEST DRAIN ATS (WOUND CARE) ×4 IMPLANT
TAPE CLOTH SURG 4X10 WHT LF (GAUZE/BANDAGES/DRESSINGS) ×8 IMPLANT
TAPE PAPER 3X10 WHT MICROPORE (GAUZE/BANDAGES/DRESSINGS) ×4 IMPLANT
TOWEL OR 17X24 6PK STRL BLUE (TOWEL DISPOSABLE) ×8 IMPLANT
TOWEL OR 17X26 10 PK STRL BLUE (TOWEL DISPOSABLE) ×8 IMPLANT
TRAY FOLEY IC TEMP SENS 16FR (CATHETERS) ×4 IMPLANT
TUBE FEEDING 8FR 16IN STR KANG (MISCELLANEOUS) ×4 IMPLANT
TUBING INSUFFLATION (TUBING) ×4 IMPLANT
UNDERPAD 30X30 INCONTINENT (UNDERPADS AND DIAPERS) ×4 IMPLANT
WATER STERILE IRR 1000ML POUR (IV SOLUTION) ×8 IMPLANT

## 2014-10-23 NOTE — Procedures (Signed)
Extubation Procedure Note  Patient Details:   Name: Travis Romero DOB: [birth year] MRN: [id number]   Airway Documentation:  Airway 8.5 mm (Active)  Secured at (cm) 24 cm 10/23/2014 10:46 PM  Measured From Lips 10/23/2014 10:46 PM  Secured Location Right 10/23/2014 10:46 PM  Secured By Rana Snare Tape 10/23/2014 10:46 PM  Site Condition Dry 10/23/2014 10:46 PM    Evaluation  O2 sats: stable throughout Complications: No apparent complications Patient did tolerate procedure well. Bilateral Breath Sounds: Rhonchi Suctioning: Oral, Airway Yes  RT extubated patient per protocol. NIF -23, VC 1.0. Positive cuff leak, patient able to lift head off bed. Extubated to 6L [place].  RN to get ABG in one hour.  Gave patient IS and RT coached patient throughout.  Bobbye Charleston 10/23/2014, 11:49 PM

## 2014-10-23 NOTE — Significant Event (Signed)
Dr. Roxan Hockey made aware of chest tube air leak.

## 2014-10-23 NOTE — H&P (View-Only) (Signed)
Reason for Consult:3 vessel CAD with ischemic cardiomyopathy Referring Physician: Dr. Vilma Prader Travis Romero is an 64 y.o. male.  HPI: 64 yo man presented last Friday with a cc/o shortness of breath.  He is a 64 yo man with a history of tobacco abuse, COPD, PAD, BPH, hypertension and adjustment disorder. He has no prior cardiac history. He had been getting progressively more short of breath for about a week PTA. He was at work last Friday and a co-worker noted him laboring to breathe. EMS was called and he was brought to the ED. He had progressive acute on chronic respiratory failure and was intubated. It was determined he had acute systolic and diastolic CHF. He was weaned and extubated. An echo showed severe LV dysfunction with an EF of 20-25% and mild MR. Yesterday he had cardiac catheterization and was found to have severe 3 vessel CAD. He had Severe LV dysfunction. There was 2+ MR in the setting of a PVC.   He denies any chest pain, pressure or tightness. He does have exertional shortness of breath with "not much" exertion. He has claudication if he walks too fast. He denies peripheral edema but did have scrotal swelling PTA.  Past Medical History  Diagnosis Date  . Hypertension   . COPD (chronic obstructive pulmonary disease)   . BPH (benign prostatic hyperplasia)   . Back pain 2007    after fall down steps  Peripheral arterial disease  Past Surgical History  Procedure Laterality Date  . Tonsillectomy    . Pyloromyotomy      pyloric stenosis  . Cardiac catheterization N/A 10/17/2014    Procedure: Right/Left Heart Cath and Coronary Angiography;  Surgeon: Troy Sine, MD;  Location: [facility];  Service: Cardiovascular;  Laterality: N/A;    Family History  Problem Relation Age of Onset  . Hypertension Mother   . Heart disease Father     Social History:  reports that he has been smoking Cigarettes.  He has smoked for the past 20 years. He has never used smokeless  tobacco. He reports that he drinks about 1.2 - 1.8 oz of alcohol per week. He reports that he does not use illicit drugs.  Allergies:  Allergies  Allergen Reactions  . Advair Diskus [Fluticasone-Salmeterol] Other (See Comments)    Exacerbated BPH   . Flexeril [Cyclobenzaprine] Other (See Comments)    Caused shortness of breath and swelling in scrotum    Medications:  Prior to Admission:  Prescriptions prior to admission  Medication Sig Dispense Refill Last Dose  . ALPRAZolam (XANAX) 0.5 MG tablet TAKE 1 TABLET BY MOUTH 3 TIMES A DAY AS NEEDED FOR 90 DAYS  0 10/10/2014 at Unknown time  . benzonatate (TESSALON) 100 MG capsule Take 100-200 mg by mouth 3 (three) times daily as needed for cough.   10/10/2014 at Unknown time  . cloNIDine (CATAPRES) 0.2 MG tablet Take 0.2 mg by mouth 4 (four) times daily as needed.    10/10/2014 at Unknown time  . dutasteride (AVODART) 0.5 MG capsule Take 0.5 mg by mouth daily.  0 10/10/2014 at Unknown time  . Ipratropium-Albuterol (COMBIVENT IN) Inhale into the lungs.   Taking  . isradipine (DYNACIRC) 5 MG capsule Take 10 mg by mouth 2 (two) times daily.  1 10/10/2014 at Unknown time  . mirtazapine (REMERON) 45 MG tablet Take 45 mg by mouth at bedtime.   10/10/2014 at Unknown time  . spironolactone (ALDACTONE) 50 MG tablet Take 75 mg by  mouth daily.   10/10/2014 at Unknown time  . telmisartan (MICARDIS) 80 MG tablet Take 80 mg by mouth daily.  1 10/10/2014 at Unknown time  . traMADol (ULTRAM) 50 MG tablet Take 1 tablet (50 mg total) by mouth every 8 (eight) hours as needed. 30 tablet 0 10/10/2014 at Unknown time  . cyclobenzaprine (FLEXERIL) 10 MG tablet Take 1 tablet (10 mg total) by mouth 3 (three) times daily as needed for muscle spasms. (Patient not taking: Reported on 10/11/2014) 30 tablet 0 Not Taking at Unknown time    Results for orders placed or performed during the hospital encounter of 10/11/14 (from the past 48 hour(s))  Protein, pleural or peritoneal fluid      Status: None   Collection Time: 10/16/14  2:10 PM  Result Value Ref Range   Total protein, fluid <3.0 g/dL    Comment: REPEATED TO VERIFY (NOTE) No normal range established for this test Results should be evaluated in conjunction with serum values Performed at [facility]: RIGHT CORRECTED ON 07/13 AT 1419: PREVIOUSLY REPORTED AS Pleural R   pH, body fluid     Status: None   Collection Time: 10/16/14  2:10 PM  Result Value Ref Range   pH, Fluid Type      CORRECTED ON 07/13 AT 1420: PREVIOUSLY REPORTED AS PLEURAL R    Comment: CORRECTED ON 07/13 AT 2152: PREVIOUSLY REPORTED AS PLEURAL RIGHT, CORRECTED ON 07/13 AT 1420: PREVIOUSLY REPORTED AS Pleural R   pH, Fluid 8.00     Comment: Performed at [facility], body fluid-bottle     Status: None (Preliminary result)   Collection Time: 10/16/14  2:10 PM  Result Value Ref Range   Specimen Description FLUID PLEURAL    Special Requests BOTTLES DRAWN AEROBIC ONLY 5CC    Culture      NO GROWTH < 24 HOURS Performed at [facility]    Report Status PENDING   Gram stain     Status: None (Preliminary result)   Collection Time: 10/16/14  2:10 PM  Result Value Ref Range   Specimen Description FLUID PLEURAL    Special Requests BOTTLES DRAWN AEROBIC ONLY 5CC    Gram Stain      WBC PRESENT,BOTH PMN AND MONONUCLEAR NO ORGANISMS SEEN CYTOSPIN SLIDE Performed at [facility]    Report Status PENDING   Lactate dehydrogenase (CSF, pleural or peritoneal fluid)     Status: Abnormal   Collection Time: 10/16/14  2:13 PM  Result Value Ref Range   LD, Fluid 104 (H) 3 - 23 U/L    Comment: (NOTE) Results should be evaluated in conjunction with serum values Performed at [facility]    Fluid Type-FLDH PLEURAL     Comment: RIGHT CORRECTED ON 07/13 AT 1419: PREVIOUSLY REPORTED AS Pleural R   Body fluid cell count with differential     Status: Abnormal   Collection  Time: 10/16/14  2:13 PM  Result Value Ref Range   Fluid Type-FCT PLEURAL     Comment: RIGHT CORRECTED ON 07/13 AT 1419: PREVIOUSLY REPORTED AS Pleural R    Color, Fluid YELLOW YELLOW   Appearance, Fluid HAZY (A) CLEAR   WBC, Fluid 418 0 - 1000 cu mm   Neutrophil Count, Fluid 15 0 - 25 %   Lymphs, Fluid 40 %   Monocyte-Macrophage-Serous Fluid 45 (L) 50 - 90 %   Eos, Fluid  0 %   Other Cells, Fluid CORRELATE WITH CYTOLOGY. %  Basic metabolic panel     Status: Abnormal   Collection Time: 10/17/14 10:42 AM  Result Value Ref Range   Sodium 130 (L) 135 - 145 mmol/L   Potassium 5.1 3.5 - 5.1 mmol/L   Chloride 95 (L) 101 - 111 mmol/L   CO2 26 22 - 32 mmol/L   Glucose, Bld 179 (H) 65 - 99 mg/dL   BUN 20 6 - 20 mg/dL   Creatinine, Ser 1.12 0.61 - 1.24 mg/dL   Calcium 8.8 (L) 8.9 - 10.3 mg/dL   GFR calc non Af Amer >60 >60 mL/min   GFR calc Af Amer >60 >60 mL/min    Comment: (NOTE) The eGFR has been calculated using the CKD EPI equation. This calculation has not been validated in all clinical situations. eGFR's persistently <60 mL/min signify possible Chronic Kidney Disease.    Anion gap 9 5 - 15  CBC with Differential/Platelet     Status: Abnormal   Collection Time: 10/17/14 10:42 AM  Result Value Ref Range   WBC 22.9 (H) 4.0 - 10.5 K/uL   RBC 4.30 4.22 - 5.81 MIL/uL   Hemoglobin 15.3 13.0 - 17.0 g/dL   HCT 46.1 39.0 - 52.0 %   MCV 107.2 (H) 78.0 - 100.0 fL   MCH 35.6 (H) 26.0 - 34.0 pg   MCHC 33.2 30.0 - 36.0 g/dL   RDW 14.0 11.5 - 15.5 %   Platelets 279 150 - 400 K/uL   Neutrophils Relative % 82 (H) 43 - 77 %   Neutro Abs 18.9 (H) 1.7 - 7.7 K/uL   Lymphocytes Relative 6 (L) 12 - 46 %   Lymphs Abs 1.4 0.7 - 4.0 K/uL   Monocytes Relative 11 3 - 12 %   Monocytes Absolute 2.6 (H) 0.1 - 1.0 K/uL   Eosinophils Relative 0 0 - 5 %   Eosinophils Absolute 0.0 0.0 - 0.7 K/uL   Basophils Relative 0 0 - 1 %   Basophils Absolute 0.0 0.0 - 0.1 K/uL  I-STAT 3, venous blood gas (G3P V)      Status: Abnormal   Collection Time: 10/17/14  1:53 PM  Result Value Ref Range   pH, Ven 7.312 (H) 7.250 - 7.300   pCO2, Ven 56.5 (H) 45.0 - 50.0 mmHg   pO2, Ven 40.0 30.0 - 45.0 mmHg   Bicarbonate 28.6 (H) 20.0 - 24.0 mEq/L   TCO2 30 0 - 100 mmol/L   O2 Saturation 68.0 %   Acid-Base Excess 1.0 0.0 - 2.0 mmol/L   Sample type VENOUS   I-STAT 3, arterial blood gas (G3+)     Status: Abnormal   Collection Time: 10/17/14  1:58 PM  Result Value Ref Range   pH, Arterial 7.364 7.350 - 7.450   pCO2 arterial 48.0 (H) 35.0 - 45.0 mmHg   pO2, Arterial 69.0 (L) 80.0 - 100.0 mmHg   Bicarbonate 27.4 (H) 20.0 - 24.0 mEq/L   TCO2 29 0 - 100 mmol/L   O2 Saturation 93.0 %   Acid-Base Excess 1.0 0.0 - 2.0 mmol/L   Sample type ARTERIAL   Basic metabolic panel     Status: Abnormal   Collection Time: 10/18/14  3:25 AM  Result Value Ref Range   Sodium 131 (L) 135 - 145 mmol/L   Potassium 5.1 3.5 - 5.1 mmol/L   Chloride 96 (L) 101 - 111 mmol/L   CO2 28 22 - 32 mmol/L  Glucose, Bld 131 (H) 65 - 99 mg/dL   BUN 22 (H) 6 - 20 mg/dL   Creatinine, Ser 1.13 0.61 - 1.24 mg/dL   Calcium 8.7 (L) 8.9 - 10.3 mg/dL   GFR calc non Af Amer >60 >60 mL/min   GFR calc Af Amer >60 >60 mL/min    Comment: (NOTE) The eGFR has been calculated using the CKD EPI equation. This calculation has not been validated in all clinical situations. eGFR's persistently <60 mL/min signify possible Chronic Kidney Disease.    Anion gap 7 5 - 15  CBC     Status: Abnormal   Collection Time: 10/18/14  3:25 AM  Result Value Ref Range   WBC 15.9 (H) 4.0 - 10.5 K/uL   RBC 3.84 (L) 4.22 - 5.81 MIL/uL   Hemoglobin 13.8 13.0 - 17.0 g/dL   HCT 41.7 39.0 - 52.0 %   MCV 108.6 (H) 78.0 - 100.0 fL   MCH 35.9 (H) 26.0 - 34.0 pg   MCHC 33.1 30.0 - 36.0 g/dL   RDW 14.0 11.5 - 15.5 %   Platelets 270 150 - 400 K/uL    No results found.  Review of Systems  Constitutional: Positive for malaise/fatigue. Negative for fever and chills.   Respiratory: Positive for cough, shortness of breath and wheezing.   Cardiovascular: Positive for claudication. Negative for chest pain, palpitations and leg swelling.  Gastrointestinal: Negative for nausea and blood in stool.  Genitourinary: Positive for urgency and frequency. Negative for hematuria.       Scrotal swelling  Musculoskeletal: Positive for back pain and neck pain.  Skin: Positive for itching.       "cellulitis" of legs  Endo/Heme/Allergies: Bruises/bleeds easily.  All other systems reviewed and are negative.  Blood pressure 140/76, pulse 91, temperature 98 F (36.7 C), temperature source Oral, resp. rate 17, height 5' 7"  (1.702 m), weight 135 lb 5.8 oz (61.4 kg), SpO2 98 %. Physical Exam  Vitals reviewed. Constitutional: He is oriented to person, place, and time.  Thin appears older than stated age  HENT:  Head: Normocephalic and atraumatic.  Eyes: Conjunctivae and EOM are normal. No scleral icterus.  Neck: Neck supple. No thyromegaly present.  No carotid bruits  Cardiovascular: Normal rate and regular rhythm.  Exam reveals gallop and S4.   No murmur heard. Pulses:      Radial pulses are 2+ on the right side, and 2+ on the left side.       Dorsalis pedis pulses are 0 on the right side, and 0 on the left side.       Posterior tibial pulses are 0 on the right side, and 0 on the left side.  Abnormal Allen's test on left  Respiratory: He has no wheezes. He has rales (faint).  Diminished breath sounds bilaterally  GI: Soft. There is no tenderness.  Musculoskeletal: He exhibits no edema.  Lymphadenopathy:    He has no cervical adenopathy.  Neurological: He is alert and oriented to person, place, and time. No cranial nerve deficit. Coordination normal.  No focal motor deficit  Skin: Skin is warm and dry.  Severe discoloration of both legs below knees with multiple open wounds   ECHOCARDIOGRAM Study Conclusions  - Left ventricle: The cavity size was normal. Wall  thickness was increased in a pattern of moderate to severe LVH. Systolic function was severely reduced. The estimated ejection fraction was in the range of 20% to 25%. Severe diffuse hypokinesis. Although no diagnostic regional wall  motion abnormality was identified, this possibility cannot be completely excluded on the basis of this study. Doppler parameters are consistent with abnormal left ventricular relaxation (grade 1 diastolic dysfunction). - Aortic valve: Trileaflet; normal thickness, mildly calcified leaflets. - Mitral valve: There was mild regurgitation. - Left atrium: The atrium was mildly to moderately dilated. - Pulmonary arteries: Systolic pressure was mildly increased. PA peak pressure: 35 mm Hg (S).   CARDIAC CATHETERIZATION  Prox RCA lesion, 100% stenosed.  Prox Cx lesion, 100% stenosed.  Dist LAD lesion, 100% stenosed.  2nd Diag lesion, 75% stenosed.  Prox LAD lesion, 60% stenosed.  There is severe left ventricular systolic dysfunction.  Severe LV dysfunction with an ejection fraction of approximately 25% with diffuse hypocontractility and more pronounced hypokinesis of the midinferior wall. There is at least 2+ and angiographic mitral regurgitation being more pronounced post ectopy beats.  Significant coronary calcification with severe multivessel CAD.  Proximal 60% LAD stenosis with a twin like LAD with 70% stenosis in the more superior diagonal like branch and total occlusion of the mid LAD. Other twin vessel after septal perforating artery with faint distal filling of this distal segment to the apex.  Normal large ramus intermediate vessel.  Total occlusion of the proximal left circumflex vessel.  Total occlusion of the proximal RCA with left to right collateralization to the distal RCA.  Hemo Data       Most Recent Value   Fick Cardiac Output  5.24 L/min   Fick Cardiac Output Index  3.08 (L/min)/BSA   Thermal Cardiac  Output  4.67 L/min   Thermal Cardiac Output Index  2.75 (L/min)/BSA   RA A Wave  5 mmHg   RA V Wave  4 mmHg   RA Mean  4 mmHg   RV Systolic Pressure  18 mmHg   RV Diastolic Pressure  4 mmHg   RV EDP  5 mmHg   PA Systolic Pressure  20 mmHg   PA Diastolic Pressure  12 mmHg   PA Mean  15 mmHg   PW A Wave  10 mmHg   PW V Wave  12 mmHg   PW Mean  12 mmHg   AO Systolic Pressure  92 mmHg   AO Diastolic Pressure  61 mmHg   AO Mean  74 mmHg   LV Systolic Pressure  88 mmHg   LV Diastolic Pressure  11 mmHg   LV EDP  16 mmHg   Arterial Occlusion Pressure Extended Systolic Pressure  79 mmHg   Arterial Occlusion Pressure Extended Diastolic Pressure  51 mmHg   Arterial Occlusion Pressure Extended Mean Pressure  63 mmHg   Left Ventricular Apex Extended Systolic Pressure  80 mmHg   Left Ventricular Apex Extended Diastolic Pressure  4 mmHg   Left Ventricular Apex Extended EDP Pressure  14 mmHg   TPVR Index  5.83 HRUI   TSVR Index  26.94 HRUI   PVR SVR Ratio  0.06   TPVR/TSVR Ratio  0.22   PFTs  FVC= 2.22 pre- 2.45(59%) postbronchodilator FEV1 0.96 pre- 1.06(34%) FEV1/FVC= 0.57  Assessment/Plan: 64 yo man with a history of tobacco abuse, severe COPD, hypertension and severe PAD presents with acute on chronic respiratory failure due to acute systolic and diastolic left heart failure. He has severe 3 vessel CAD with impaired LV function. There was 2+ MR at cath but only mild by echo and no significant murmur, so I suspect the MR was due to PVC +/- catheter entangled with the subvalvular apparatus.  Revascularization is indicated for survival benefit and relief of symptoms.   CABG is high risk due to severe LV dysfunction and severe PAD and COPD. Conduit availability is limited due to PAD. The left radial is not usable and we cannot go below the knee on either leg. He has severe LV dysfunction but PA pressures and right heart hemodynamics are OK.  I discussed the  option of CABG with the patient. I reviewed the general nature of the procedure, the need for general anesthesia, the use of cardiopulmonary bypass and the incisions to be used. We discussed the expected hospital stay, overall recovery and short and long term outcomes. He understands the risks include, but are not limited to death, stroke, MI, DVT/PE, bleeding, possible need for transfusion, infections, cardiac arrhythmias, leg ischemia and other organ system dysfunction including respiratory, renal, or GI complications.   He accepts the risks and agrees to proceed.  He has improved clinically since admission but needs continued medical optimization before surgery.  Tentatively plan CABG on Wednesday 10/23/2014  Melrose Nakayama 10/18/2014, 1:40 PM

## 2014-10-23 NOTE — Anesthesia Procedure Notes (Signed)
Procedure Name: Intubation Date/Time: 10/23/2014 8:58 AM Performed by: Melina Copa, Masayuki Sakai R Pre-anesthesia Checklist: Patient identified, Emergency Drugs available, Suction available, Patient being monitored and Timeout performed Patient Re-evaluated:Patient Re-evaluated prior to inductionOxygen Delivery Method: Circle system utilized Preoxygenation: Pre-oxygenation with 100% oxygen Intubation Type: IV induction Ventilation: Mask ventilation with difficulty, Two handed mask ventilation required and Oral airway inserted - appropriate to patient size Laryngoscope Size: Miller and 3 Grade View: Grade I Tube type: Oral Tube size: 8.5 mm Number of attempts: 1 Airway Equipment and Method: Stylet Placement Confirmation: ETT inserted through vocal cords under direct vision,  positive ETCO2 and breath sounds checked- equal and bilateral Secured at: 23 cm Tube secured with: Tape Dental Injury: Teeth and Oropharynx as per pre-operative assessment

## 2014-10-23 NOTE — Significant Event (Addendum)
Attempted to wean on SIMV of 4. Patient is awake, following commands, precedex on 0.56mcg at this time. Noted O2 saturations in mid 80s less than 5 minutes into weaning on SIMV of 4. Dr. Roxan Hockey is at bedside. Verbal order for ABG from MD. Patient is placed back on full ventilator support with FIO2 100%.   ABG results are as following. Results for ROB, MCIVER (MRN [id number]) as of 10/23/2014 17:12.     Ref. Range 10/23/2014 16:57  Sample type Unknown ARTERIAL  pH, Arterial Latest Ref Range: 7.350-7.450  7.352  pCO2 arterial Latest Ref Range: 35.0-45.0 mmHg 44.9  pO2, Arterial Latest Ref Range: 80.0-100.0 mmHg 42.0 (L)  Bicarbonate Latest Ref Range: 20.0-24.0 mEq/L 24.9 (H)  TCO2 Latest Ref Range: 0-100 mmol/L 26  Acid-base deficit Latest Ref Range: 0.0-2.0 mmol/L 1.0  O2 Saturation Latest Units: % 75.0  Patient temperature Unknown 36.7 C  Collection site Unknown ARTERIAL LINE   Charge RN Elmyra Ricks showed this result to incoming MD-Dr.Owen who gave verbal orders to do recruitments. Patient remains on full support. Will attempt to wean again later. Catalaya Garr, Therapist, sports.

## 2014-10-23 NOTE — Brief Op Note (Addendum)
10/11/2014 - 10/23/2014      [address]       [place] [address]             [phone]     10/11/2014 - 10/23/2014  12:48 PM  PATIENT:  Travis Romero  64 y.o. male  PRE-OPERATIVE DIAGNOSIS: 3 VESSEL CAD, ISCHEMIC CARDIOMYOPATHY  POST-OPERATIVE DIAGNOSIS:  3 VESSEL CAD, ISCHEMIC CARDIOMYOPATHY  PROCEDURE:  Procedure(s): CORONARY ARTERY BYPASS GRAFTING (CABG)X3   LIMA to LAD  SVG toOM2  SVG to DISTAL RCA TRANSESOPHAGEAL ECHOCARDIOGRAM (TEE) ENDOSCOPIC GREATER SAPH VEIN HARVEST RIGHT THIGH  SURGEON:  Surgeon(s): Melrose Nakayama, MD  PHYSICIAN ASSISTANT: WAYNE GOLD PA-C  ANESTHESIA:   general  PATIENT CONDITION:  ICU - intubated and hemodynamically stable.  PRE-OPERATIVE WEIGHT: 59kg  EBL: SEE ANEST/PERFUSION RECORDS  COMPLICATIONS: NO KNOWN  XC= 77 min CPB= 130 min- off with dopamine at 3 and milrinone 0.2  TEE- impaired LV function, EF ~ 30%. Trace MR  Diffusely diseased targets.  LIMA good, vein fair.

## 2014-10-23 NOTE — Progress Notes (Signed)
Patient underwent a CABG procedure today and was moved from [place] to 2[facility].  Written handoff will be provided to Evette Cristal, LCSWA for information purposes. Current plan remains the same:  CIR or will go home with his sister(s) to stay with him at home. He does not want SNF placement nor does his sister.  This CSW will sign off. Lorie Phenix. Pauline Good, [place]

## 2014-10-23 NOTE — Interval H&P Note (Signed)
History and Physical Interval Note:  10/23/2014 8:34 AM  Travis Romero  has presented today for surgery, with the diagnosis of severe 3 vessel CAD   The various methods of treatment have been discussed with the patient and family. After consideration of risks, benefits and other options for treatment, the patient has consented to  Procedure(s): CORONARY ARTERY BYPASS GRAFTING (CABG) (N/A) TRANSESOPHAGEAL ECHOCARDIOGRAM (TEE) (N/A) as a surgical intervention .  The patient's history has been reviewed, patient examined, no change in status, stable for surgery.  I have reviewed the patient's chart and labs.  Questions were answered to the patient's satisfaction.     Melrose Nakayama

## 2014-10-23 NOTE — Progress Notes (Signed)
Patient ID: Travis Romero, male   DOB: January 03, 1951, 64 y.o.   MRN: [id number]   SICU Evening Rounds:   Hemodynamically stable  CI = 2.5  Has started to wake up on vent. While starting weaning he desaturated and was recruited. Sats now 97%. Audible air leak from left lung. Urine output good  CT output low, air leak present  CBC    Component Value Date/Time   WBC 29.3* 10/23/2014 1440   RBC 3.17* 10/23/2014 1440   RBC 3.54* 10/13/2014 1736   HGB 11.9* 10/23/2014 1445   HCT 35.0* 10/23/2014 1445   PLT 292 10/23/2014 1440   MCV 107.6* 10/23/2014 1440   MCH 35.3* 10/23/2014 1440   MCHC 32.8 10/23/2014 1440   RDW 13.7 10/23/2014 1440   LYMPHSABS 1.4 10/17/2014 1042   MONOABS 2.6* 10/17/2014 1042   EOSABS 0.0 10/17/2014 1042   BASOSABS 0.0 10/17/2014 1042     BMET    Component Value Date/Time   NA 130* 10/23/2014 1445   K 4.9 10/23/2014 1445   CL 96* 10/23/2014 1328   CO2 33* 10/22/2014 1522   GLUCOSE 135* 10/23/2014 1445   BUN 24* 10/23/2014 1328   CREATININE 1.00 10/23/2014 1328   CALCIUM 8.8* 10/22/2014 1522   GFRNONAA 54* 10/22/2014 1522   GFRAA >60 10/22/2014 1522     A/P:  Stable postop course. Continue current plans

## 2014-10-23 NOTE — Progress Notes (Signed)
Report called to anesthesia, report given to day shift RN.

## 2014-10-23 NOTE — Transfer of Care (Signed)
Immediate Anesthesia Transfer of Care Note  Patient: Travis Romero  Procedure(s) Performed: Procedure(s):   CORONARY ARTERY BYPASS GRAFTING (CABG)X3 LIMA-LAD;SVG to OM,SVG to Distal RCA (N/A) TRANSESOPHAGEAL ECHOCARDIOGRAM (TEE) (N/A)  Patient Location: SICU  Anesthesia Type:General  Level of Consciousness: sedated, unresponsive and Patient remains intubated per anesthesia plan  Airway & Oxygen Therapy: Patient remains intubated per anesthesia plan and Patient placed on Ventilator (see vital sign flow sheet for setting)  Post-op Assessment: Report given to RN and Post -op Vital signs reviewed and stable  Post vital signs: Reviewed and stable  Last Vitals:  Filed Vitals:   10/23/14 0511  BP:   Pulse: 93  Temp:   Resp:     Complications: No apparent anesthesia complications

## 2014-10-23 NOTE — Progress Notes (Signed)
  Echocardiogram Echocardiogram Transesophageal has been performed.  Diamond Nickel 10/23/2014, 9:52 AM

## 2014-10-23 NOTE — Anesthesia Postprocedure Evaluation (Signed)
  Anesthesia Post-op Note  Patient: Travis Romero  Procedure(s) Performed: Procedure(s):   CORONARY ARTERY BYPASS GRAFTING (CABG)X3 LIMA-LAD;SVG to OM,SVG to Distal RCA (N/A) TRANSESOPHAGEAL ECHOCARDIOGRAM (TEE) (N/A)  Patient Location: SICU  Anesthesia Type:General  Level of Consciousness: sedated and Patient remains intubated per anesthesia plan  Airway and Oxygen Therapy: Patient remains intubated per anesthesia plan and Patient placed on Ventilator (see vital sign flow sheet for setting)  Post-op Pain: none  Post-op Assessment: Post-op Vital signs reviewed, Patient's Cardiovascular Status Stable, Respiratory Function Stable and Pain level controlled              Post-op Vital Signs: stable  Last Vitals:  Filed Vitals:   10/23/14 1725  BP: 90/58  Pulse: 99  Temp: 36.8 C  Resp: 18    Complications: No apparent anesthesia complications

## 2014-10-24 ENCOUNTER — Encounter (HOSPITAL_COMMUNITY): Payer: Self-pay | Admitting: Thoracic Surgery (Cardiothoracic Vascular Surgery)

## 2014-10-24 ENCOUNTER — Inpatient Hospital Stay (HOSPITAL_COMMUNITY): Payer: PRIVATE HEALTH INSURANCE

## 2014-10-24 DIAGNOSIS — Z951 Presence of aortocoronary bypass graft: Secondary | ICD-10-CM

## 2014-10-24 LAB — GLUCOSE, CAPILLARY
GLUCOSE-CAPILLARY: 108 mg/dL — AB (ref 65–99)
GLUCOSE-CAPILLARY: 104 mg/dL — AB (ref 65–99)
GLUCOSE-CAPILLARY: 120 mg/dL — AB (ref 65–99)
GLUCOSE-CAPILLARY: 124 mg/dL — AB (ref 65–99)
GLUCOSE-CAPILLARY: 131 mg/dL — AB (ref 65–99)
GLUCOSE-CAPILLARY: 134 mg/dL — AB (ref 65–99)
Glucose-Capillary: 103 mg/dL — ABNORMAL HIGH (ref 65–99)
Glucose-Capillary: 103 mg/dL — ABNORMAL HIGH (ref 65–99)
Glucose-Capillary: 104 mg/dL — ABNORMAL HIGH (ref 65–99)
Glucose-Capillary: 105 mg/dL — ABNORMAL HIGH (ref 65–99)
Glucose-Capillary: 108 mg/dL — ABNORMAL HIGH (ref 65–99)
Glucose-Capillary: 108 mg/dL — ABNORMAL HIGH (ref 65–99)
Glucose-Capillary: 114 mg/dL — ABNORMAL HIGH (ref 65–99)
Glucose-Capillary: 116 mg/dL — ABNORMAL HIGH (ref 65–99)
Glucose-Capillary: 121 mg/dL — ABNORMAL HIGH (ref 65–99)
Glucose-Capillary: 143 mg/dL — ABNORMAL HIGH (ref 65–99)
Glucose-Capillary: 96 mg/dL (ref 65–99)

## 2014-10-24 LAB — POCT I-STAT 4, (NA,K, GLUC, HGB,HCT)
Glucose, Bld: 118 mg/dL — ABNORMAL HIGH (ref 65–99)
HCT: 33 % — ABNORMAL LOW (ref 39.0–52.0)
HEMOGLOBIN: 11.2 g/dL — AB (ref 13.0–17.0)
Potassium: 5.2 mmol/L — ABNORMAL HIGH (ref 3.5–5.1)
Sodium: 131 mmol/L — ABNORMAL LOW (ref 135–145)

## 2014-10-24 LAB — BASIC METABOLIC PANEL
Anion gap: 8 (ref 5–15)
BUN: 21 mg/dL — ABNORMAL HIGH (ref 6–20)
CHLORIDE: 101 mmol/L (ref 101–111)
CO2: 23 mmol/L (ref 22–32)
Calcium: 8.4 mg/dL — ABNORMAL LOW (ref 8.9–10.3)
Creatinine, Ser: 1.07 mg/dL (ref 0.61–1.24)
Glucose, Bld: 112 mg/dL — ABNORMAL HIGH (ref 65–99)
POTASSIUM: 5.1 mmol/L (ref 3.5–5.1)
SODIUM: 132 mmol/L — AB (ref 135–145)

## 2014-10-24 LAB — POCT I-STAT 3, ART BLOOD GAS (G3+)
ACID-BASE DEFICIT: 2 mmol/L (ref 0.0–2.0)
Acid-base deficit: 2 mmol/L (ref 0.0–2.0)
Acid-base deficit: 2 mmol/L (ref 0.0–2.0)
BICARBONATE: 23.4 meq/L (ref 20.0–24.0)
Bicarbonate: 22.8 mEq/L (ref 20.0–24.0)
Bicarbonate: 22.4 mEq/L (ref 20.0–24.0)
O2 Saturation: 92 %
O2 Saturation: 93 %
O2 Saturation: 96 %
PH ART: 7.342 — AB (ref 7.350–7.450)
Patient temperature: 37.5
TCO2: 24 mmol/L (ref 0–100)
TCO2: 24 mmol/L (ref 0–100)
TCO2: 25 mmol/L (ref 0–100)
pCO2 arterial: 37.8 mmHg (ref 35.0–45.0)
pCO2 arterial: 38.1 mmHg (ref 35.0–45.0)
pCO2 arterial: 43.4 mmHg (ref 35.0–45.0)
pH, Arterial: 7.391 (ref 7.350–7.450)
pH, Arterial: 7.38 (ref 7.350–7.450)
pO2, Arterial: 71 mmHg — ABNORMAL LOW (ref 80.0–100.0)
pO2, Arterial: 72 mmHg — ABNORMAL LOW (ref 80.0–100.0)
pO2, Arterial: 84 mmHg (ref 80.0–100.0)

## 2014-10-24 LAB — POCT I-STAT, CHEM 8
BUN: 27 mg/dL — ABNORMAL HIGH (ref 6–20)
BUN: 27 mg/dL — ABNORMAL HIGH (ref 6–20)
CHLORIDE: 96 mmol/L — AB (ref 101–111)
CHLORIDE: 98 mmol/L — AB (ref 101–111)
CREATININE: 1.4 mg/dL — AB (ref 0.61–1.24)
Calcium, Ion: 1.3 mmol/L (ref 1.13–1.30)
Calcium, Ion: 1.26 mmol/L (ref 1.13–1.30)
Creatinine, Ser: 1.3 mg/dL — ABNORMAL HIGH (ref 0.61–1.24)
Glucose, Bld: 109 mg/dL — ABNORMAL HIGH (ref 65–99)
Glucose, Bld: 121 mg/dL — ABNORMAL HIGH (ref 65–99)
HCT: 30 % — ABNORMAL LOW (ref 39.0–52.0)
HEMATOCRIT: 29 % — AB (ref 39.0–52.0)
HEMOGLOBIN: 9.9 g/dL — AB (ref 13.0–17.0)
Hemoglobin: 10.2 g/dL — ABNORMAL LOW (ref 13.0–17.0)
POTASSIUM: 5.1 mmol/L (ref 3.5–5.1)
Potassium: 5.4 mmol/L — ABNORMAL HIGH (ref 3.5–5.1)
Sodium: 129 mmol/L — ABNORMAL LOW (ref 135–145)
Sodium: 129 mmol/L — ABNORMAL LOW (ref 135–145)
TCO2: 25 mmol/L (ref 0–100)
TCO2: 26 mmol/L (ref 0–100)

## 2014-10-24 LAB — CBC
HCT: 29.1 % — ABNORMAL LOW (ref 39.0–52.0)
HEMATOCRIT: 30.7 % — AB (ref 39.0–52.0)
HEMOGLOBIN: 9.4 g/dL — AB (ref 13.0–17.0)
Hemoglobin: 10.1 g/dL — ABNORMAL LOW (ref 13.0–17.0)
MCH: 35.3 pg — ABNORMAL HIGH (ref 26.0–34.0)
MCH: 35.1 pg — AB (ref 26.0–34.0)
MCHC: 32.9 g/dL (ref 30.0–36.0)
MCHC: 32.3 g/dL (ref 30.0–36.0)
MCV: 107.3 fL — AB (ref 78.0–100.0)
MCV: 108.6 fL — AB (ref 78.0–100.0)
Platelets: 241 10*3/uL (ref 150–400)
Platelets: 245 10*3/uL (ref 150–400)
RBC: 2.86 MIL/uL — ABNORMAL LOW (ref 4.22–5.81)
RBC: 2.68 MIL/uL — AB (ref 4.22–5.81)
RDW: 13.8 % (ref 11.5–15.5)
RDW: 14 % (ref 11.5–15.5)
WBC: 17.8 10*3/uL — ABNORMAL HIGH (ref 4.0–10.5)
WBC: 16.9 10*3/uL — AB (ref 4.0–10.5)

## 2014-10-24 LAB — MAGNESIUM
MAGNESIUM: 2.7 mg/dL — AB (ref 1.7–2.4)
Magnesium: 2.3 mg/dL (ref 1.7–2.4)

## 2014-10-24 LAB — GRAM STAIN

## 2014-10-24 LAB — CREATININE, SERUM
Creatinine, Ser: 1.32 mg/dL — ABNORMAL HIGH (ref 0.61–1.24)
GFR calc non Af Amer: 56 mL/min — ABNORMAL LOW (ref >60)

## 2014-10-24 MED ORDER — MIRTAZAPINE 45 MG PO TABS
45.0000 mg | ORAL_TABLET | Freq: Every day | ORAL | Status: DC
  Administered 2014-10-24 – 2014-10-28 (×5): 45 mg via ORAL
  Filled 2014-10-24 (×6): qty 1

## 2014-10-24 MED ORDER — INSULIN ASPART 100 UNIT/ML ~~LOC~~ SOLN
0.0000 [IU] | SUBCUTANEOUS | Status: DC
  Administered 2014-10-24 (×3): 2 [IU] via SUBCUTANEOUS

## 2014-10-24 MED ORDER — INSULIN DETEMIR 100 UNIT/ML ~~LOC~~ SOLN
10.0000 [IU] | Freq: Once | SUBCUTANEOUS | Status: AC
  Administered 2014-10-24: 10 [IU] via SUBCUTANEOUS
  Filled 2014-10-24: qty 0.1

## 2014-10-24 MED ORDER — FUROSEMIDE 10 MG/ML IJ SOLN
40.0000 mg | Freq: Once | INTRAMUSCULAR | Status: AC
  Administered 2014-10-24: 40 mg via INTRAVENOUS
  Filled 2014-10-24: qty 4

## 2014-10-24 MED ORDER — ENOXAPARIN SODIUM 40 MG/0.4ML ~~LOC~~ SOLN
40.0000 mg | Freq: Every day | SUBCUTANEOUS | Status: DC
  Administered 2014-10-24 – 2014-11-03 (×11): 40 mg via SUBCUTANEOUS
  Filled 2014-10-24 (×12): qty 0.4

## 2014-10-24 MED ORDER — INSULIN DETEMIR 100 UNIT/ML ~~LOC~~ SOLN
5.0000 [IU] | Freq: Every day | SUBCUTANEOUS | Status: DC
  Filled 2014-10-24: qty 0.05

## 2014-10-24 MED ORDER — DUTASTERIDE 0.5 MG PO CAPS
0.5000 mg | ORAL_CAPSULE | Freq: Every day | ORAL | Status: DC
Start: 2014-10-24 — End: 2014-11-04
  Administered 2014-10-24 – 2014-11-04 (×12): 0.5 mg via ORAL
  Filled 2014-10-24 (×13): qty 1

## 2014-10-24 MED ORDER — BENZONATATE 100 MG PO CAPS
100.0000 mg | ORAL_CAPSULE | Freq: Three times a day (TID) | ORAL | Status: DC | PRN
  Filled 2014-10-24: qty 2

## 2014-10-24 MED ORDER — INSULIN ASPART 100 UNIT/ML ~~LOC~~ SOLN
0.0000 [IU] | SUBCUTANEOUS | Status: DC
  Administered 2014-10-24: 2 [IU] via SUBCUTANEOUS

## 2014-10-24 MED ORDER — ALPRAZOLAM 0.5 MG PO TABS
0.5000 mg | ORAL_TABLET | Freq: Three times a day (TID) | ORAL | Status: DC | PRN
Start: 2014-10-24 — End: 2014-11-04
  Administered 2014-10-24 – 2014-11-04 (×16): 0.5 mg via ORAL
  Filled 2014-10-24 (×18): qty 1

## 2014-10-24 MED FILL — Sodium Chloride IV Soln 0.9%: INTRAVENOUS | Qty: 2000 | Status: AC

## 2014-10-24 MED FILL — Sodium Bicarbonate IV Soln 8.4%: INTRAVENOUS | Qty: 50 | Status: AC

## 2014-10-24 MED FILL — Heparin Sodium (Porcine) Inj 1000 Unit/ML: INTRAMUSCULAR | Qty: 30 | Status: AC

## 2014-10-24 MED FILL — Potassium Chloride Inj 2 mEq/ML: INTRAVENOUS | Qty: 40 | Status: AC

## 2014-10-24 MED FILL — Dexmedetomidine HCl in NaCl 0.9% IV Soln 400 MCG/100ML: INTRAVENOUS | Qty: 100 | Status: AC

## 2014-10-24 MED FILL — Electrolyte-R (PH 7.4) Solution: INTRAVENOUS | Qty: 4000 | Status: AC

## 2014-10-24 MED FILL — Lidocaine HCl IV Inj 20 MG/ML: INTRAVENOUS | Qty: 5 | Status: AC

## 2014-10-24 MED FILL — Magnesium Sulfate Inj 50%: INTRAMUSCULAR | Qty: 10 | Status: AC

## 2014-10-24 MED FILL — Mannitol IV Soln 20%: INTRAVENOUS | Qty: 500 | Status: AC

## 2014-10-24 NOTE — Progress Notes (Signed)
MD made aware of K increasing from am labs K is 5.4; MD gave orders to get repeat labs at 2200.  Rowe Pavy, RN

## 2014-10-24 NOTE — Addendum Note (Signed)
Addendum  created 10/24/14 1027 by Roberts Gaudy, MD   Modules edited: Notes Section   Notes Section:  Pend: [phone]; [place]: [phone]

## 2014-10-24 NOTE — CV Procedure (Signed)
Intra-operative Transesophageal Echocardiography Report:  Mr. Travis Romero is a 64 year old man with history of smoking, COPD, peripheral arterial disease, BPH, and hypertension who presented with a one-week history of progressive shortness of breath. At work he was noted to have severe labored breathing and EMS was called. He was brought to the [facility] Emergency Department and intubated. He was determined to be in congestive heart failure and an echocardiogram showed severe left ventricular dysfunction with an ejection fraction of 20-25%. Cardiac catheterization revealed  severe three-vessel coronary disease and severe left ventricular dysfunction. He was scheduled to undergo coronary artery bypass grafting by Dr. Roxan Hockey. Intraoperative transesophageal echocardiography was indicated to assess the right and left ventricular function, to assess for any valvular pathology, and to serve as a monitor for intraoperative volume status and  cardiac function.  The patient was brought to the operating room at [facility] and general anesthesia was induced without difficulty. Following endotracheal intubation and orogastric suctioning, the transesophageal echocardiography probe was inserted into the esophagus without difficulty.  Impression: Pre-bypass findings:  1. Aortic valve: The aortic valve was trileaflet. The leaflets opened normally without restriction. There was no thickening of the leaflets. There was no aortic insufficiency.   2. Mitral valve: The mitral annulus appeared dilated. However the leaflets coapted adequately and there was trace to 1+ mitral insufficiency. There were no prolapsing or flail segments noted. There was no significant mitral annular calcification or leaflet thickening noted.   3. Left ventricle: There was moderate to severe left ventricular dysfunction. There was diffuse left ventricular hypokinesis. Left ventricular ejection fraction was estimated at  30-35%. There was moderate left ventricular hypertrophy. Left ventricular wall thickness measured 1.19 cm at the anterior wall and 1.25 cm of the posterior wall at end diastole at the mid-papillary level. Left ventricular end-diastolic diameter measured 4.04 cm  at the mid-papillary level in the transgastric short axis view. There were no thinned and/or akinetic segments noted.  4. Right ventricle: The RV was of normal  size. There was normal appearing contractility of the right ventricular free wall and overall normal right appearing right ventricular function.  5. Tricuspid valve: The tricuspid valve appeared structurally normal and there was 1+ tricuspid insufficiency.  6. Interatrial septum: The interatrial septum was intact without evidence of patent foramen ovale or atrial septal defect by color Doppler and bubble study.  7. Left atrium: There was no thrombus noted within left atrial cavity or left atrial appendage.  8.  Ascending aorta: There was a well-defined aortic root and and sinuses of Valsalva without dilatation or effacement. There was moderate thickening of the wall of the ascending aorta. There was  a mobile plaque noted approximately 4.2 cm distal to the aortic annulus distal to  the right coronary cusp.  9 Descending aorta: There was diffuse concentric atherosclerotic disease noted within the descending aorta. This was graded  as 3-4. The descending aorta measured 2.5 cm in diameter. The plaque measured 1.0-2.4 mm in diameter.  Post-bypass findings:  1. Aortic valve: The aortic valve appeared unchanged from the pre-bypass study. The leaflets opened normally without restriction. There was no aortic insufficiency.  2. Mitral valve: The mitral leaflets appeared to open normally. There was trace mitral insufficiency. There were no prolapsing or flail segments leaflet segments noted.   3. Left ventricle: There was again diffuse left ventricular hypokinesis. This appeared unchanged  or slightly improved from the pre-bypass study. Ejection fraction was estimated at 30-35%.   4. Right ventricle:  Right ventricular size was normal. There was normal appearing  contractility the right ventricular free wall.  5. Tricuspid valve: The tricuspid valve appeared structurally intact. There was 1+ tricuspid insufficiency.  Roberts Gaudy

## 2014-10-24 NOTE — Addendum Note (Signed)
Addendum  created 10/24/14 0744 by Roberts Gaudy, MD   Modules edited: Notes Section   Notes Section:  File: [id number]; Pend: [phone]; Pend: [id number]; Raelyn Number: [id number]; Raelyn Number: [id number]; Pend: [id number]; Pend: [id number]

## 2014-10-24 NOTE — Progress Notes (Signed)
1 Day Post-Op Procedure(s) (LRB):   CORONARY ARTERY BYPASS GRAFTING (CABG)X3 LIMA-LAD;SVG to OM,SVG to Distal RCA (N/A) TRANSESOPHAGEAL ECHOCARDIOGRAM (TEE) (N/A) Subjective: Complains of pain "all over" C/o numbness lateral aspect left hand and 4th/ 5th fingers   Objective: Vital signs in last 24 hours: Temp:  [97.2 F (36.2 C)-99.7 F (37.6 C)] 98.4 F (36.9 C) (07/21 0700) Pulse Rate:  [93-110] 102 (07/21 0700) Cardiac Rhythm:  [-] Sinus tachycardia (07/21 0745) Resp:  [11-32] 18 (07/21 0700) BP: (90-126)/(53-109) 117/63 mmHg (07/21 0700) SpO2:  [92 %-100 %] 99 % (07/21 0700) Arterial Line BP: (77-159)/(44-69) 126/57 mmHg (07/21 0700) FiO2 (%):  [40 %-100 %] 40 % (07/20 2300) Weight:  [142 lb 6.7 oz (64.6 kg)] 142 lb 6.7 oz (64.6 kg) (07/21 0500)  Hemodynamic parameters for last 24 hours: PAP: (23-41)/(13-22) 31/18 mmHg CO:  [3.8 L/min-7.4 L/min] 4.2 L/min CI:  [2.3 L/min/m2-4.4 L/min/m2] 2.5 L/min/m2  Intake/Output from previous day: 07/20 0701 - 07/21 0700 In: 8603.9 [I.V.:6493.9; Blood:350; NG/GT:60; IV Piggyback:1700] Out: 4200 [Urine:2900; Blood:750; Chest Tube:550] Intake/Output this shift:    General appearance: alert, cooperative and no distress Neurologic: motor intact , decreased sensation left hand Heart: regular rate and rhythm Lungs: diminished breath sounds bilaterally Abdomen: normal findings: soft, non-tender Extremities: pulses intact LUE  Lab Results:  Recent Labs  10/23/14 2000 10/23/14 2012 10/24/14 0408  WBC 23.9*  --  17.8*  HGB 10.7* 11.2* 10.1*  HCT 32.6* 33.0* 30.7*  PLT 266  --  241   BMET:  Recent Labs  10/22/14 1522  10/23/14 1328  10/23/14 2000 10/23/14 2012 10/24/14 0408  NA 135  < > 131*  < >  --  131* 132*  K 4.6  < > 4.9  < >  --  5.2* 5.1  CL 95*  < > 96*  --   --   --  101  CO2 33*  --   --   --   --   --  23  GLUCOSE 87  < > 133*  < >  --  118* 112*  BUN 33*  < > 24*  --   --   --  21*  CREATININE 1.35*  < >  1.00  --  1.00  --  1.07  CALCIUM 8.8*  --   --   --   --   --  8.4*  < > = values in this interval not displayed.  PT/INR:  Recent Labs  10/23/14 1440  LABPROT 15.6*  INR 1.23   ABG    Component Value Date/Time   PHART 7.342* 10/24/2014 0050   HCO3 23.4 10/24/2014 0050   TCO2 25 10/24/2014 0050   ACIDBASEDEF 2.0 10/24/2014 0050   O2SAT 92.0 10/24/2014 0050   CBG (last 3)   Recent Labs  10/24/14 0255 10/24/14 0351 10/24/14 0741  GLUCAP 96 103* 121*    Assessment/Plan: S/P Procedure(s) (LRB):   CORONARY ARTERY BYPASS GRAFTING (CABG)X3 LIMA-LAD;SVG to OM,SVG to Distal RCA (N/A) TRANSESOPHAGEAL ECHOCARDIOGRAM (TEE) (N/A) -  POD # 1 CABG x 3  CV- stable, off inotropes- dc swan  Beta blocker, statin, ASA  RESP- severe COPD, no air leak from CT  Looks like pulmonary edema on CXR  Bronchodilators/ IS/ diuresis  RENAL- creatinine below baseline- likely dilutional  Diuresis  ENDO- CBG well controlled, transition to SSI  Deconditioning- mobilize as tolerated  Restart xanax  DC CT  DVT prophylaxis- enoxaparin   LOS: 12 days    Travis Romero  Travis Romero 10/24/2014

## 2014-10-24 NOTE — Op Note (Signed)
NAMEROBIE, MCNIEL NO.:  [id number]  MEDICAL RECORD NO.:  [id number]  LOCATION:  2S02C                        FACILITY:  [facility]  PHYSICIAN:  Travis Romero, M.D.DATE OF BIRTH:  Apr 02, 1951  DATE OF PROCEDURE:  10/23/2014 DATE OF DISCHARGE:                              OPERATIVE REPORT   PREOPERATIVE DIAGNOSIS:  Severe three-vessel disease with ischemic cardiomyopathy.  POSTOPERATIVE DIAGNOSIS:  Severe three-vessel disease with ischemic cardiomyopathy.  PROCEDURE:   Median sternotomy, extracorporeal circulation Coronary artery bypass grafting x 3   Left internal mammary artery to left anterior descending  Saphenous vein graft to obtuse marginal 2  Saphenous vein graft to distal right coronary Endoscopic vein harvest right leg.  SURGEON:  Travis Romero, M.D.  FIRST ASSISTANT:  John Giovanni, PA-C.  ANESTHESIA:  General.  FINDINGS:  Prebypass: transesophageal echocardiography showed ejection fraction of 30%-35%% with global hypokinesis.  Trace mitral regurgitation.  Intraoperative: Sternal osteoporosis. Severe bullous emphysema.  Mammary good quality.  Vein fair quality.  Pericarditis with exudate.  Diffusely diseased target vessels.  Post bypass: transesophageal echocardiography revealed improved LV wall motion in the setting of inotropic support.  CLINICAL NOTE:  Travis Romero is a 64 year old man with a history of heavy tobacco abuse, COPD, peripheral arterial disease, and hypertension.  He presented with progressive shortness of breath and developed acute on chronic respiratory failure,  He required intubation. He was diagnosed with pulmonary edema.  An echocardiogram showed severe left ventricular dysfunction with ejection fraction of 20-25%.  There was mild mitral regurgitation.  Cardiac catheterization revealed severe three-vessel coronary artery disease.  There was 2+ MR in the setting of a premature ventricular contraction.   The patient was advised to undergo coronary artery bypass grafting and possible mitral valve repair depending on intraoperative findings.  It was felt that mitral repair was unlikely to be necessary.  The indications, risks, benefits, and alternatives were discussed in detail with the patient.  He understood and accepted the risks and agreed to proceed.  OPERATIVE NOTE:  The patient was brought to the preoperative holding area on October 23, 2014.  Anesthesia placed a Swan-Ganz catheter and an arterial blood pressure monitoring line.  He was taken to the operating room, anesthetized, and intubated.  Intravenous antibiotics were administered.  A Foley catheter was placed.  The chest, abdomen, and legs were prepped and draped in the usual sterile fashion. Transesophageal echocardiography was performed.  Findings were as noted above.  There was no significant mitral regurgitation.  Ejection fraction appeared better than it had on the preoperative echo, but was still diminished and estimated to be around 30%.  A median sternotomy was performed.  There was sternal osteoporosis.  The left internal mammary artery was harvested using standard technique. The patient was noted to have severe bullous emphysematous changes in the left lung particularly in the upper lobe.  There were multiple adhesions of the bulla in the left apex.  These ultimately had to be taken down with electrocautery to allow adequate passage of the mammary artery to reach the LAD.  The mammary itself was of good quality vessel and was harvested using standard technique.  There was excellent flow when divided distally.  Simultaneously with the mammary artery harvest, an incision made in the medial aspect of the right leg and the greater saphenous vein was harvested from the knee to the groin endoscopically, it was a fair quality vessel.  2000 units of heparin was administered during the vessel harvest.  The remainder of the full  heparin dose was given prior to opening the pericardium.  After harvesting the conduits, the remainder of the full heparin dose was given.  The pericardium was opened.  There was pericarditis with adhesions and exudate.  The ascending aorta was inspected.  There was some mobile plaque that had been noted on the anterior aspect of the ascending aorta on transesophageal echocardiography.  This area was avoided.  The aorta was cannulated just below the takeoff of the innominate artery.  A dual-stage venous cannula was placed via a pursestring suture in the right atrial appendage.  After confirming adequate anticoagulation with ACT measurement,  the patient was placed on cardiopulmonary bypass.  Flows were maintained per protocol.  He was cooled to 32 degrees Celsius.  The coronary arteries were inspected and anastomotic sites were chosen.  There were 2 OM branches, the smaller OM1 was not graftable and was diffusely diseased.  OM2 was also diffusely diseased, but was graftable.  The LAD likewise was diffusely diseased. The distal right coronary was the best of the 3 targets.  The conduits were inspected and cut to length.  A foam pad was placed in the pericardium to insulate the heart and protect the left phrenic nerve.  A temperature probe was placed in myocardial septum and a cardioplegia cannula was placed in the ascending aorta.  The aorta was crossclamped.  The left ventricle was emptied via the aortic root vent.  Cardiac arrest then was achieved with a combination of cold antegrade blood cardioplegia and topical iced saline. 1.5 L of cardioplegia was administered.  There was a rapid diastolic arrest and septal cooling to 10 degrees Celsius.  A reversed saphenous vein graft was placed end-to-side to the distal right coronary.  This vessel accepted a 1.5 mm probe.  It was grafted just beyond the acute margin of the heart at the level of the takeoff of the posterior descending. An  end-to-side anastomosis was performed with a running 7-0 Prolene suture.  All anastomoses were probed proximally and distally at their completion.  Cardioplegia was administered.  There was good flow and good hemostasis.  The heart was elevated.  OM1 was inspected closely and was a small diffusely diseased vessel that was not suitable for grafting.  OM2 was Inspected. A site was selected for possible bypass relatively proximal in the vessel and arteriotomy was made.  A 1 mm probe met an obstruction and would not pass just distal to the arteriotomy.  This site was oversewn. A 2nd arteriotomy was made more distally in the vessel and a probe passed further distally. An end-to-side anastomosis was performed with the vein graft using a running 7-0 Prolene suture.  Cardioplegia was administered, there was good flow and good hemostasis.  Additional cardioplegia was administered down the aortic root.  The pericardium was incised to allow passage of the left internal mammary artery.  The distal end of the mammary was beveled.  It was anastomosed end-to-side to the distal LAD with a running 8-0 Prolene suture.  The distal LAD was diffusely diseased.  A 1.5 mm probe did pass both proximally and distally from the anastomotic site.  The anastomosis was performed with a running  8-0 Prolene suture.  At the completion the mammary to LAD anastomosis, the bulldog clamp was removed.  Rapid septal rewarming was noted.  The bulldog clamp was replaced.  The mammary pedicle was tacked to the epicardial surface of the heart with 6-0 Prolene sutures.  The vein grafts were cut to length.  Additional cardioplegia was administered via the ascending aorta.  The cardioplegia cannula was removed and the proximal vein graft anastomoses were performed to 4.0 mm punch aortotomies with running 6-0 Prolene sutures.  At the completion of the final proximal anastomosis, the patient was placed in Trendelenburg position.   Lidocaine was administered.  The aortic root was de-aired.  The bulldog clamp was removed the left mammary artery and the aortic crossclamp was removed.  The total crossclamp time was 77 minutes.  The patient initially was in heart block, but spontaneously resumed sinus rhythm.  He did not require defibrillation.  A dopamine infusion was started at 3 mcg/kg per minute and the patient was loaded with milrinone.  While rewarming was completed, all proximal and distal anastomoses were inspected for hemostasis.  Epicardial pacing wires were placed on the right ventricle and right atrium.  When the patient had rewarmed to a core temperature of 37 degrees Celsius, he was weaned from cardiopulmonary bypass on the first attempt.  He was on dopamine at 3 mcg/kg per minute and milrinone at 0.2 mcg/kg/minute at the time of separation from bypass.  The total bypass time was 130 minutes.  The initial cardiac index was greater than 2 L/minute/sq and the patient remained hemodynamically stable throughout the postbypass. Postbypass transesophageal echocardiography showed improved wall motion.  A test dose of protamine was administered and was well tolerated.  The atrial and aortic cannulae were removed.  Remainder of the protamine was administered without incident.  The chest was irrigated with warm saline.  Hemostasis was achieved.  A left pleural and mediastinal chest tubes were placed through separate subcostal incisions.  The sternum was closed with a combination of single and double heavy gauge stainless steel wires.  The pectoralis fascia, subcutaneous tissue, and skin were closed in standard fashion.  All sponge, needle, and instrument counts were correct at the end of the procedure.  The patient was taken from the operating room to the surgical intensive care unit in good condition.     Travis Standard Roxan Romero, M.D.     SCH/MEDQ  D:  10/23/2014  T:  10/24/2014  Job:  [id number]

## 2014-10-24 NOTE — Progress Notes (Signed)
Patient ID: Travis Romero, male   DOB: 07-22-1950, 64 y.o.   MRN: [id number] EVENING ROUNDS NOTE :     [address]       [place] [address]             [phone]                 1 Day Post-Op Procedure(s) (LRB):   CORONARY ARTERY BYPASS GRAFTING (CABG)X3 LIMA-LAD;SVG to OM,SVG to Distal RCA (N/A) TRANSESOPHAGEAL ECHOCARDIOGRAM (TEE) (N/A)  Total Length of Stay:  LOS: 12 days  BP 124/73 mmHg  Pulse 95  Temp(Src) 98.3 F (36.8 C) (Oral)  Resp 22  Ht 5\' 7"  (1.702 m)  Wt 142 lb 6.7 oz (64.6 kg)  BMI 22.30 kg/m2  SpO2 100%  .Intake/Output      07/20 0701 - 07/21 0700 07/21 0701 - 07/22 0700   P.O.     I.V. (mL/kg) 6493.9 (100.5) 300 (4.6)   Blood 350    NG/GT 60    IV Piggyback 1700 50   Total Intake(mL/kg) 8603.9 (133.2) 350 (5.4)   Urine (mL/kg/hr) 2900 (1.9) 1070 (1.5)   Emesis/NG output 0 (0)    Stool     Blood 750 (0.5)    Chest Tube 550 (0.4) 20 (0)   Total Output 4200 1090   Net +4403.9 -740          . sodium chloride 20 mL/hr at 10/23/14 1500  . sodium chloride    . sodium chloride 20 mL/hr at 10/23/14 1507  . dexmedetomidine Stopped (10/24/14 0300)  . DOPamine Stopped (10/24/14 0100)  . lactated ringers Stopped (10/24/14 1200)  . lactated ringers    . milrinone Stopped (10/23/14 1445)  . nitroGLYCERIN    . phenylephrine (NEO-SYNEPHRINE) Adult infusion Stopped (10/23/14 2300)     Lab Results  Component Value Date   WBC 16.9* 10/24/2014   HGB 10.2* 10/24/2014   HCT 30.0* 10/24/2014   PLT 245 10/24/2014   GLUCOSE 109* 10/24/2014   CHOL 219* 10/16/2014   TRIG 73 10/12/2014   ALT 46 10/22/2014   AST 21 10/22/2014   NA 129* 10/24/2014   K 5.4* 10/24/2014   CL 96* 10/24/2014   CREATININE 1.40* 10/24/2014   BUN 27* 10/24/2014   CO2 23 10/24/2014   INR 1.23 10/23/2014   HGBA1C 5.9* 10/22/2014   k up to 5.4 cr 1.4 mildly confused   Grace Isaac MD  Beeper [phone] Office [phone] 10/24/2014 5:57 PM

## 2014-10-24 NOTE — Progress Notes (Signed)
DC CTs per MD order and protocol.  Rowe Pavy, RN

## 2014-10-25 ENCOUNTER — Inpatient Hospital Stay (HOSPITAL_COMMUNITY): Payer: PRIVATE HEALTH INSURANCE

## 2014-10-25 ENCOUNTER — Encounter (HOSPITAL_COMMUNITY): Payer: Self-pay | Admitting: Anesthesiology

## 2014-10-25 LAB — GLUCOSE, CAPILLARY
GLUCOSE-CAPILLARY: 97 mg/dL (ref 65–99)
GLUCOSE-CAPILLARY: 108 mg/dL — AB (ref 65–99)
Glucose-Capillary: 151 mg/dL — ABNORMAL HIGH (ref 65–99)
Glucose-Capillary: 121 mg/dL — ABNORMAL HIGH (ref 65–99)

## 2014-10-25 LAB — BASIC METABOLIC PANEL
ANION GAP: 8 (ref 5–15)
BUN: 24 mg/dL — AB (ref 6–20)
CO2: 27 mmol/L (ref 22–32)
Calcium: 8.8 mg/dL — ABNORMAL LOW (ref 8.9–10.3)
Chloride: 96 mmol/L — ABNORMAL LOW (ref 101–111)
Creatinine, Ser: 1.36 mg/dL — ABNORMAL HIGH (ref 0.61–1.24)
GFR calc Af Amer: >60 mL/min (ref >60)
GFR calc non Af Amer: 54 mL/min — ABNORMAL LOW (ref >60)
Glucose, Bld: 105 mg/dL — ABNORMAL HIGH (ref 65–99)
Potassium: 5.3 mmol/L — ABNORMAL HIGH (ref 3.5–5.1)
SODIUM: 131 mmol/L — AB (ref 135–145)

## 2014-10-25 LAB — CBC
HCT: 30.7 % — ABNORMAL LOW (ref 39.0–52.0)
HEMOGLOBIN: 9.8 g/dL — AB (ref 13.0–17.0)
MCH: 34.8 pg — ABNORMAL HIGH (ref 26.0–34.0)
MCHC: 31.9 g/dL (ref 30.0–36.0)
MCV: 108.9 fL — AB (ref 78.0–100.0)
PLATELETS: 260 10*3/uL (ref 150–400)
RBC: 2.82 MIL/uL — ABNORMAL LOW (ref 4.22–5.81)
RDW: 14.2 % (ref 11.5–15.5)
WBC: 17.2 10*3/uL — ABNORMAL HIGH (ref 4.0–10.5)

## 2014-10-25 MED ORDER — FUROSEMIDE 40 MG PO TABS
40.0000 mg | ORAL_TABLET | Freq: Every day | ORAL | Status: DC
  Filled 2014-10-25: qty 1

## 2014-10-25 MED ORDER — INSULIN ASPART 100 UNIT/ML ~~LOC~~ SOLN
0.0000 [IU] | Freq: Three times a day (TID) | SUBCUTANEOUS | Status: DC
  Administered 2014-10-25: 2 [IU] via SUBCUTANEOUS
  Administered 2014-10-25 – 2014-10-27 (×6): 1 [IU] via SUBCUTANEOUS
  Administered 2014-10-27: 3 [IU] via SUBCUTANEOUS
  Administered 2014-10-28 (×2): 1 [IU] via SUBCUTANEOUS
  Administered 2014-10-28: 2 [IU] via SUBCUTANEOUS
  Administered 2014-10-30 – 2014-11-04 (×10): 1 [IU] via SUBCUTANEOUS
  Administered 2014-11-04: 2 [IU] via SUBCUTANEOUS

## 2014-10-25 MED ORDER — LEVALBUTEROL HCL 0.63 MG/3ML IN NEBU
0.6300 mg | INHALATION_SOLUTION | Freq: Four times a day (QID) | RESPIRATORY_TRACT | Status: DC
  Administered 2014-10-25 – 2014-10-30 (×18): 0.63 mg via RESPIRATORY_TRACT
  Filled 2014-10-25 (×34): qty 3

## 2014-10-25 MED ORDER — CARVEDILOL 6.25 MG PO TABS
6.2500 mg | ORAL_TABLET | Freq: Two times a day (BID) | ORAL | Status: DC
  Administered 2014-10-25 – 2014-10-27 (×6): 6.25 mg via ORAL
  Filled 2014-10-25 (×9): qty 1

## 2014-10-25 MED ORDER — CLONIDINE HCL 0.2 MG PO TABS
0.2000 mg | ORAL_TABLET | Freq: Two times a day (BID) | ORAL | Status: DC
  Administered 2014-10-25 – 2014-10-31 (×14): 0.2 mg via ORAL
  Filled 2014-10-25 (×16): qty 1

## 2014-10-25 MED ORDER — FUROSEMIDE 10 MG/ML IJ SOLN
40.0000 mg | Freq: Once | INTRAMUSCULAR | Status: AC
  Administered 2014-10-25: 40 mg via INTRAVENOUS
  Filled 2014-10-25: qty 4

## 2014-10-25 NOTE — Progress Notes (Signed)
Patient ID: Travis Romero, male   DOB: 12-23-1950, 64 y.o.   MRN: [id number]  SICU Evening Rounds:  Hemodynamically stable  Diuresing well  No problems tonight

## 2014-10-25 NOTE — Progress Notes (Signed)
2 Days Post-Op Procedure(s) (LRB):   CORONARY ARTERY BYPASS GRAFTING (CABG)X3 LIMA-LAD;SVG to OM,SVG to Distal RCA (N/A) TRANSESOPHAGEAL ECHOCARDIOGRAM (TEE) (N/A) Subjective: Feels better today Less pain Still difficulty getting OOB Denies nausea and shortness of breath  Objective: Vital signs in last 24 hours: Temp:  [98.1 F (36.7 C)-98.4 F (36.9 C)] 98.3 F (36.8 C) (07/22 0710) Pulse Rate:  [46-104] 104 (07/22 0600) Cardiac Rhythm:  [-] Sinus tachycardia (07/22 0400) Resp:  [11-29] 21 (07/22 0600) BP: (98-157)/(54-112) 149/69 mmHg (07/22 0600) SpO2:  [91 %-100 %] 96 % (07/22 0710) Arterial Line BP: (128-136)/(60-75) 136/64 mmHg (07/21 1100) Weight:  [147 lb 11.3 oz (67 kg)] 147 lb 11.3 oz (67 kg) (07/22 0500)  Hemodynamic parameters for last 24 hours: PAP: (30-48)/(19-36) 36/20 mmHg CO:  [4.2 L/min] 4.2 L/min CI:  [2.5 L/min/m2] 2.5 L/min/m2  Intake/Output from previous day: 07/21 0701 - 07/22 0700 In: [place] [P.O.:1080; I.V.:540; IV Piggyback:100] Out: 2956 [Urine:1730; Chest Tube:20] Intake/Output this shift:    General appearance: alert, cooperative and no distress Neurologic: intact Heart: tachy, regular Lungs: diminished breath sounds bilaterally Abdomen: normal findings: soft, non-tender  Lab Results:  Recent Labs  10/24/14 1625  10/24/14 2126 10/25/14 0410  WBC 16.9*  --   --  17.2*  HGB 9.4*  < > 9.9* 9.8*  HCT 29.1*  < > 29.0* 30.7*  PLT 245  --   --  260  < > = values in this interval not displayed. BMET:  Recent Labs  10/24/14 0408  10/24/14 2126 10/25/14 0410  NA 132*  < > 129* 131*  K 5.1  < > 5.1 5.3*  CL 101  < > 98* 96*  CO2 23  --   --  27  GLUCOSE 112*  < > 121* 105*  BUN 21*  < > 27* 24*  CREATININE 1.07  < > 1.30* 1.36*  CALCIUM 8.4*  --   --  8.8*  < > = values in this interval not displayed.  PT/INR:  Recent Labs  10/23/14 1440  LABPROT 15.6*  INR 1.23   ABG    Component Value Date/Time   PHART 7.342* 10/24/2014  0050   HCO3 23.4 10/24/2014 0050   TCO2 26 10/24/2014 2126   ACIDBASEDEF 2.0 10/24/2014 0050   O2SAT 92.0 10/24/2014 0050   CBG (last 3)   Recent Labs  10/24/14 1924 10/24/14 2328 10/25/14 0334  GLUCAP 131* 134* 97    Assessment/Plan: S/P Procedure(s) (LRB):   CORONARY ARTERY BYPASS GRAFTING (CABG)X3 LIMA-LAD;SVG to OM,SVG to Distal RCA (N/A) TRANSESOPHAGEAL ECHOCARDIOGRAM (TEE) (N/A) POD # 2 CABG  CV- mildly tachy and hypertensive- will change beta blocker to coreg and increase relative dose  Restart clonidine for hypertension   RESP- severe COPD, CXR shows atelectasis and small effusion  Continue xopenex, IS  RENAL- creatinine is at his baseline, still volume overloaded- IV lasix this AM  Will dc Foley later today  ENDO_ CBG well controlled, dc levemir, change SSI to AC/HS  DVT prophylaxis- enoxaparin  Deconditioning- PT consult  Will keep in SICU today due to mobility issues     LOS: 13 days    Melrose Nakayama 10/25/2014

## 2014-10-25 NOTE — Clinical Social Work Note (Signed)
Discharge plan is to go to CIR or sister's house with 24 hour supervision.  PT is recommending SNF, however family do not want to have patient go to SNF, due to the cost and his limited amount of insurance coverage.  Patient has family who will be able to help provide 24 hour care for patient once he is medically ready for discharge.  CSW to continue to follow patient in case plan changes to go to SNF.  Jones Broom. [place], MSW, [place] 10/25/2014 4:58 PM

## 2014-10-25 NOTE — Progress Notes (Signed)
Anesthesiology Follow-up:  Awake and alert, neuro intact in good spirits. Having difficulty moving from bed to chair.  VS: T- 36.8 BP- 140/70 HR 108 (sinus tach) RR 16 O2 sat 98% on 3L  K-5.3,  BUN/Cr-24/1.36 glucose 105 H/H -9.8/30.7 platelets-28,48  64 year old male, with  Severe COPD, ischemic cardiomyopathy, moderate renal dysfunction  underwent CABG X 3 on 7/20  Extubated 9 hours post-op, patient appears deconditioned, persistent sinus tach, given carvedilol, renal function at baseline.  Roberts Gaudy

## 2014-10-26 ENCOUNTER — Inpatient Hospital Stay (HOSPITAL_COMMUNITY): Payer: PRIVATE HEALTH INSURANCE

## 2014-10-26 ENCOUNTER — Encounter (HOSPITAL_COMMUNITY): Payer: Self-pay | Admitting: *Deleted

## 2014-10-26 LAB — BASIC METABOLIC PANEL
Anion gap: 7 (ref 5–15)
BUN: 22 mg/dL — ABNORMAL HIGH (ref 6–20)
CALCIUM: 8.9 mg/dL (ref 8.9–10.3)
CHLORIDE: 95 mmol/L — AB (ref 101–111)
CO2: 30 mmol/L (ref 22–32)
Creatinine, Ser: 1.17 mg/dL (ref 0.61–1.24)
GFR calc Af Amer: >60 mL/min (ref >60)
Glucose, Bld: 154 mg/dL — ABNORMAL HIGH (ref 65–99)
Potassium: 4.7 mmol/L (ref 3.5–5.1)
Sodium: 132 mmol/L — ABNORMAL LOW (ref 135–145)

## 2014-10-26 LAB — CBC
HEMATOCRIT: 29.7 % — AB (ref 39.0–52.0)
HEMOGLOBIN: 9.6 g/dL — AB (ref 13.0–17.0)
MCH: 35.3 pg — ABNORMAL HIGH (ref 26.0–34.0)
MCHC: 32.3 g/dL (ref 30.0–36.0)
MCV: 109.2 fL — AB (ref 78.0–100.0)
PLATELETS: 287 10*3/uL (ref 150–400)
RBC: 2.72 MIL/uL — AB (ref 4.22–5.81)
RDW: 14.2 % (ref 11.5–15.5)
WBC: 14.8 10*3/uL — AB (ref 4.0–10.5)

## 2014-10-26 LAB — GLUCOSE, CAPILLARY
GLUCOSE-CAPILLARY: 129 mg/dL — AB (ref 65–99)
Glucose-Capillary: 131 mg/dL — ABNORMAL HIGH (ref 65–99)
Glucose-Capillary: 145 mg/dL — ABNORMAL HIGH (ref 65–99)
Glucose-Capillary: 139 mg/dL — ABNORMAL HIGH (ref 65–99)
Glucose-Capillary: 160 mg/dL — ABNORMAL HIGH (ref 65–99)

## 2014-10-26 LAB — CARBOXYHEMOGLOBIN
Carboxyhemoglobin: 1.9 % — ABNORMAL HIGH (ref 0.5–1.5)
Methemoglobin: 1.2 % (ref 0.0–1.5)
O2 SAT: 59.3 %
Total hemoglobin: 12.9 g/dL — ABNORMAL LOW (ref 13.5–18.0)

## 2014-10-26 MED ORDER — SODIUM CHLORIDE 0.9 % IV SOLN
INTRAVENOUS | Status: DC
  Administered 2014-10-26: 20 mL/h via INTRAVENOUS
  Administered 2014-10-31: 04:00:00 via INTRAVENOUS

## 2014-10-26 MED ORDER — HYDROCERIN EX CREA
TOPICAL_CREAM | CUTANEOUS | Status: DC | PRN
  Filled 2014-10-26: qty 113

## 2014-10-26 MED ORDER — SODIUM CHLORIDE 0.9 % IJ SOLN: 10.0000 mL | INTRAMUSCULAR | Status: DC | PRN

## 2014-10-26 MED ORDER — FUROSEMIDE 10 MG/ML IJ SOLN
40.0000 mg | Freq: Two times a day (BID) | INTRAMUSCULAR | Status: DC
  Administered 2014-10-26 – 2014-10-27 (×4): 40 mg via INTRAVENOUS
  Filled 2014-10-26 (×7): qty 4

## 2014-10-26 MED ORDER — SODIUM CHLORIDE 0.9 % IJ SOLN
10.0000 mL | Freq: Two times a day (BID) | INTRAMUSCULAR | Status: DC
  Administered 2014-10-27: 10 mL

## 2014-10-26 MED ORDER — CEFTRIAXONE SODIUM IN DEXTROSE 20 MG/ML IV SOLN
1.0000 g | INTRAVENOUS | Status: DC
  Administered 2014-10-26 – 2014-10-28 (×3): 1 g via INTRAVENOUS
  Filled 2014-10-26 (×3): qty 50

## 2014-10-26 NOTE — Progress Notes (Signed)
3 Days Post-Op Procedure(s) (LRB):   CORONARY ARTERY BYPASS GRAFTING (CABG)X3 LIMA-LAD;SVG to OM,SVG to Distal RCA (N/A) TRANSESOPHAGEAL ECHOCARDIOGRAM (TEE) (N/A) Subjective:  Says he feels weak  Objective: Vital signs in last 24 hours: Temp:  [97.4 F (36.3 C)-98.7 F (37.1 C)] 98.1 F (36.7 C) (07/23 1143) Pulse Rate:  [92-115] 100 (07/23 1400) Cardiac Rhythm:  [-] Normal sinus rhythm (07/23 1200) Resp:  [13-34] 30 (07/23 1400) BP: (103-164)/(56-94) 143/72 mmHg (07/23 1400) SpO2:  [88 %-100 %] 95 % (07/23 1400) Weight:  [66.7 kg (147 lb 0.8 oz)] 66.7 kg (147 lb 0.8 oz) (07/23 0500)  Hemodynamic parameters for last 24 hours:    Intake/Output from previous day: 07/22 0701 - 07/23 0700 In: 1370 [P.O.:840; I.V.:480; IV Piggyback:50] Out: 2450 [Urine:2450] Intake/Output this shift: Total I/O In: 1060 [I.V.:910; Other:150] Out: 1725 [Urine:1725]  General appearance: flat affect Neurologic: intact Heart: regular rate and rhythm, S1, S2 normal, no murmur, click, rub or gallop Lungs: rhonchi bibasilar Extremities: edema mild Wound: dressings dry  Lab Results:  Recent Labs  10/25/14 0410 10/26/14 1020  WBC 17.2* 14.8*  HGB 9.8* 9.6*  HCT 30.7* 29.7*  PLT 260 287   BMET:  Recent Labs  10/25/14 0410 10/26/14 1020  NA 131* 132*  K 5.3* 4.7  CL 96* 95*  CO2 27 30  GLUCOSE 105* 154*  BUN 24* 22*  CREATININE 1.36* 1.17  CALCIUM 8.8* 8.9    PT/INR: No results for input(s): LABPROT, INR in the last 72 hours. ABG    Component Value Date/Time   PHART 7.342* 10/24/2014 0050   HCO3 23.4 10/24/2014 0050   TCO2 26 10/24/2014 2126   ACIDBASEDEF 2.0 10/24/2014 0050   O2SAT 59.3 10/26/2014 1050   CBG (last 3)   Recent Labs  10/25/14 2355 10/26/14 0735 10/26/14 1139  GLUCAP 129* 131* 145*    Assessment/Plan: S/P Procedure(s) (LRB):   CORONARY ARTERY BYPASS GRAFTING (CABG)X3 LIMA-LAD;SVG to OM,SVG to Distal RCA (N/A) TRANSESOPHAGEAL ECHOCARDIOGRAM (TEE)  (N/A)  Hemodynamically stable. Preop EF 25% but had normal cardiac output postop on no inotropes. Co-ox this am 59%. Slow progress due to heavy smoking, severe COPD, general deconditioning. His CXR this am shows bibasilar infiltrate or atelectasis that is new. Will start Ceftriaxone in this heavy smoker with severe COPD who is at increased risk for pneumonia. Mobilize, PT Diuresis Diabetes control foley inserted for urinary retention     LOS: 14 days    Gaye Pollack 10/26/2014

## 2014-10-26 NOTE — Evaluation (Signed)
Physical Therapy Evaluation Patient Details Name: Travis Romero MRN: [id number] DOB: June 29, 1950 Today's Date: 10/26/2014   History of Present Illness  Pt was admitted with progressive dyspnea and dx'd with CHF exacerbation.  He was intubated 7/8 - 7/11.  Pt has a h/o HTN, COPD, and back pain. Pt with AMS since extubation. 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   Clinical Impression  Patient is s/p above surgery resulting in functional limitations due to the deficits listed below (see PT Problem List). Pt limited due to overall pain and anxiety with mobility.  Limited history from pt and unable to get home environment on evaluation.  Pt does c/o left hand numbness and tingling (reports having PTA) and RN aware.  Also recommend OT consult to address overall ADLs and left hand numbness and tingling.   Patient will benefit from skilled PT to increase their independence and safety with mobility to allow discharge to the venue listed below.       Follow Up Recommendations CIR    Equipment Recommendations   (TBD)    Recommendations for Other Services OT consult;Rehab consult     Precautions / Restrictions Precautions Precautions: Fall;Sternal Restrictions Weight Bearing Restrictions: No      Mobility  Bed Mobility Overal bed mobility: Needs Assistance Bed Mobility: Supine to Sit     Supine to sit: +2 for physical assistance;Total assist     General bed mobility comments: Pt needs total assist to complete sit > supine with no UE support due to sternal precautions. Max cues for LE and UE placement.   Transfers Overall transfer level: Needs assistance Equipment used: 2 person hand held assist Transfers: Stand Pivot Transfers Sit to Stand: +2 physical assistance;Max assist;Min assist Stand pivot transfers: +2 safety/equipment;Max assist       General transfer comment: Pt was able to stand from recliner with minimal assistance x 2 however increase anxiety upon standing and needed +2  maximal assistance to complete stand pivot transfer from recliner > bed with max cues.    Ambulation/Gait Ambulation/Gait assistance:  (unable to ambulate)              Stairs            Wheelchair Mobility    Modified Rankin (Stroke Patients Only)       Balance Overall balance assessment: Needs assistance Sitting-balance support: Feet supported Sitting balance-Leahy Scale: Fair     Standing balance support: Bilateral upper extremity supported Standing balance-Leahy Scale: Poor                               Pertinent Vitals/Pain Pain Assessment: Faces Pain Score: 6  Pain Location: sternum     Home Living Family/patient expects to be discharged to:: Unsure Living Arrangements: Alone                    Prior Function Level of Independence: Independent         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, newspaper        Extremity/Trunk Assessment               Lower Extremity Assessment: Generalized weakness         Communication   Communication: HOH  Cognition Arousal/Alertness: Awake/alert Behavior During Therapy: Flat affect Overall Cognitive Status: No family/caregiver present to determine baseline cognitive functioning Area of Impairment: Safety/judgement;Following commands;Problem solving;Attention   Current Attention Level: Sustained Memory: Decreased short-term  memory Following Commands: Follows one step commands with increased time Safety/Judgement: Decreased awareness of safety;Decreased awareness of deficits   Problem Solving: Slow processing;Difficulty sequencing General Comments: does better with less verbal cues    General Comments      Exercises        Assessment/Plan    PT Assessment Patient needs continued PT services  PT Diagnosis Generalized weakness;Acute pain;Difficulty walking   PT Problem List Decreased activity tolerance;Decreased mobility  PT Treatment Interventions Gait  training;Functional mobility training;Therapeutic activities;Therapeutic exercise;Balance training;Neuromuscular re-education;Patient/family education   PT Goals (Current goals can be found in the Care Plan section) Acute Rehab PT Goals Patient Stated Goal: none stated; agreeable to PT PT Goal Formulation: With patient Time For Goal Achievement: 11/02/14 Potential to Achieve Goals: Good    Frequency Min 3X/week   Barriers to discharge        Co-evaluation               End of Session Equipment Utilized During Treatment: Gait belt;Oxygen Activity Tolerance:  (Limited by overall anxiety) Patient left: in bed;with call bell/phone within reach;with nursing/sitter in room Nurse Communication: Mobility status;Precautions         Time: 3833-3832 PT Time Calculation (min) (ACUTE ONLY): 25 min   Charges:   PT Evaluation $Initial PT Evaluation Tier I: 1 Procedure PT Treatments $Therapeutic Activity: 8-22 mins   PT G Codes:        Travis Romero 11/11/14, 1:15 PM   Travis Romero, [place] DPT [phone]

## 2014-10-26 NOTE — Plan of Care (Signed)
Problem: Phase II - Intermediate Post-Op Goal: Activity Progressed Outcome: Not Progressing Pt is very deconditioned, slow progress, stand and pivot to chair, lift back to bed

## 2014-10-27 ENCOUNTER — Inpatient Hospital Stay (HOSPITAL_COMMUNITY): Payer: PRIVATE HEALTH INSURANCE

## 2014-10-27 LAB — GLUCOSE, CAPILLARY
GLUCOSE-CAPILLARY: 138 mg/dL — AB (ref 65–99)
GLUCOSE-CAPILLARY: 124 mg/dL — AB (ref 65–99)
Glucose-Capillary: 203 mg/dL — ABNORMAL HIGH (ref 65–99)
Glucose-Capillary: 132 mg/dL — ABNORMAL HIGH (ref 65–99)

## 2014-10-27 LAB — CBC
HCT: 28.3 % — ABNORMAL LOW (ref 39.0–52.0)
Hemoglobin: 9.1 g/dL — ABNORMAL LOW (ref 13.0–17.0)
MCH: 35.1 pg — ABNORMAL HIGH (ref 26.0–34.0)
MCHC: 32.2 g/dL (ref 30.0–36.0)
MCV: 109.3 fL — ABNORMAL HIGH (ref 78.0–100.0)
PLATELETS: 313 10*3/uL (ref 150–400)
RBC: 2.59 MIL/uL — ABNORMAL LOW (ref 4.22–5.81)
RDW: 14.1 % (ref 11.5–15.5)
WBC: 13.4 10*3/uL — AB (ref 4.0–10.5)

## 2014-10-27 LAB — BASIC METABOLIC PANEL
Anion gap: 8 (ref 5–15)
BUN: 21 mg/dL — ABNORMAL HIGH (ref 6–20)
CO2: 31 mmol/L (ref 22–32)
Calcium: 8.8 mg/dL — ABNORMAL LOW (ref 8.9–10.3)
Chloride: 95 mmol/L — ABNORMAL LOW (ref 101–111)
Creatinine, Ser: 1.14 mg/dL (ref 0.61–1.24)
Glucose, Bld: 118 mg/dL — ABNORMAL HIGH (ref 65–99)
Potassium: 4.7 mmol/L (ref 3.5–5.1)
SODIUM: 134 mmol/L — AB (ref 135–145)

## 2014-10-27 MED ORDER — IRBESARTAN 75 MG PO TABS
75.0000 mg | ORAL_TABLET | Freq: Every day | ORAL | Status: DC
  Administered 2014-10-27: 75 mg via ORAL
  Filled 2014-10-27 (×2): qty 1

## 2014-10-27 MED ORDER — NICOTINE 21 MG/24HR TD PT24
21.0000 mg | MEDICATED_PATCH | Freq: Every day | TRANSDERMAL | Status: DC
  Administered 2014-10-27 – 2014-11-04 (×9): 21 mg via TRANSDERMAL
  Filled 2014-10-27 (×9): qty 1

## 2014-10-27 NOTE — Progress Notes (Signed)
Lopressor 5 mg IV PRN given for SBP > 150, HR 94.

## 2014-10-27 NOTE — Progress Notes (Signed)
4 Days Post-Op Procedure(s) (LRB):   CORONARY ARTERY BYPASS GRAFTING (CABG)X3 LIMA-LAD;SVG to OM,SVG to Distal RCA (N/A) TRANSESOPHAGEAL ECHOCARDIOGRAM (TEE) (N/A) Subjective:  No complaints  Objective: Vital signs in last 24 hours: Temp:  [97.9 F (36.6 C)-98.4 F (36.9 C)] 98.4 F (36.9 C) (07/24 0739) Pulse Rate:  [91-103] 92 (07/24 1000) Cardiac Rhythm:  [-] Sinus tachycardia (07/24 0800) Resp:  [14-34] 24 (07/24 1000) BP: (103-183)/(63-107) 139/69 mmHg (07/24 1000) SpO2:  [92 %-100 %] 98 % (07/24 1000) Weight:  [63.2 kg (139 lb 5.3 oz)] 63.2 kg (139 lb 5.3 oz) (07/24 0515)  Hemodynamic parameters for last 24 hours:    Intake/Output from previous day: 07/23 0701 - 07/24 0700 In: 2739 [P.O.:960; I.V.:1479; IV Piggyback:50] Out: 3382 [Urine:3430] Intake/Output this shift: Total I/O In: 420 [P.O.:360; I.V.:60] Out: 450 [Urine:450]  General appearance: alert and cooperative Heart: regular rate and rhythm, S1, S2 normal, no murmur, click, rub or gallop Lungs: diminished breath sounds bibasilar Extremities: edema mild Wound: dressing dry  Lab Results:  Recent Labs  10/26/14 1020 10/27/14 0355  WBC 14.8* 13.4*  HGB 9.6* 9.1*  HCT 29.7* 28.3*  PLT 287 313   BMET:  Recent Labs  10/26/14 1020 10/27/14 0355  NA 132* 134*  K 4.7 4.7  CL 95* 95*  CO2 30 31  GLUCOSE 154* 118*  BUN 22* 21*  CREATININE 1.17 1.14  CALCIUM 8.9 8.8*    PT/INR: No results for input(s): LABPROT, INR in the last 72 hours. ABG    Component Value Date/Time   PHART 7.342* 10/24/2014 0050   HCO3 23.4 10/24/2014 0050   TCO2 26 10/24/2014 2126   ACIDBASEDEF 2.0 10/24/2014 0050   O2SAT 59.3 10/26/2014 1050   CBG (last 3)   Recent Labs  10/26/14 1700 10/26/14 2156 10/27/14 0731  GLUCAP 139* 160* 138*    Assessment/Plan: S/P Procedure(s) (LRB):   CORONARY ARTERY BYPASS GRAFTING (CABG)X3 LIMA-LAD;SVG to OM,SVG to Distal RCA (N/A) TRANSESOPHAGEAL ECHOCARDIOGRAM (TEE)  (N/A)  Hypertensive. Resume ARB. Volume excess: continue diuresis Continue foley for urinary retention Deconditioning: continue to work on mobilization. Is out of bed with a lot of help but not ambulating yet. PT is seeing patient. Severe COPD. Work on IS and get flutter valve.   LOS: 15 days    Gaye Pollack 10/27/2014

## 2014-10-27 NOTE — Progress Notes (Signed)
Lopressor 5mg  IV PRN given for SBP > 150.

## 2014-10-27 NOTE — Progress Notes (Addendum)
Lopressor 5mg  IV PRN given for SBP > 130

## 2014-10-28 ENCOUNTER — Inpatient Hospital Stay (HOSPITAL_COMMUNITY): Payer: PRIVATE HEALTH INSURANCE

## 2014-10-28 LAB — URINALYSIS, ROUTINE W REFLEX MICROSCOPIC
Bilirubin Urine: NEGATIVE
Glucose, UA: NEGATIVE mg/dL
Hgb urine dipstick: NEGATIVE
Ketones, ur: NEGATIVE mg/dL
LEUKOCYTES UA: NEGATIVE
Nitrite: NEGATIVE
Protein, ur: >300 mg/dL — AB
SPECIFIC GRAVITY, URINE: 1.024 (ref 1.005–1.030)
UROBILINOGEN UA: 0.2 mg/dL (ref 0.0–1.0)
pH: 5.5 (ref 5.0–8.0)

## 2014-10-28 LAB — CBC
HCT: 30.4 % — ABNORMAL LOW (ref 39.0–52.0)
Hemoglobin: 9.8 g/dL — ABNORMAL LOW (ref 13.0–17.0)
MCH: 35.8 pg — AB (ref 26.0–34.0)
MCHC: 32.2 g/dL (ref 30.0–36.0)
MCV: 110.9 fL — AB (ref 78.0–100.0)
PLATELETS: 355 10*3/uL (ref 150–400)
RBC: 2.74 MIL/uL — AB (ref 4.22–5.81)
RDW: 14.3 % (ref 11.5–15.5)
WBC: 15 10*3/uL — AB (ref 4.0–10.5)

## 2014-10-28 LAB — GLUCOSE, CAPILLARY
GLUCOSE-CAPILLARY: 137 mg/dL — AB (ref 65–99)
GLUCOSE-CAPILLARY: 131 mg/dL — AB (ref 65–99)
Glucose-Capillary: 165 mg/dL — ABNORMAL HIGH (ref 65–99)
Glucose-Capillary: 90 mg/dL (ref 65–99)

## 2014-10-28 LAB — BASIC METABOLIC PANEL
Anion gap: 8 (ref 5–15)
BUN: 21 mg/dL — ABNORMAL HIGH (ref 6–20)
CHLORIDE: 95 mmol/L — AB (ref 101–111)
CO2: 33 mmol/L — ABNORMAL HIGH (ref 22–32)
CREATININE: 1.13 mg/dL (ref 0.61–1.24)
Calcium: 8.9 mg/dL (ref 8.9–10.3)
Glucose, Bld: 149 mg/dL — ABNORMAL HIGH (ref 65–99)
POTASSIUM: 4.4 mmol/L (ref 3.5–5.1)
SODIUM: 136 mmol/L (ref 135–145)

## 2014-10-28 LAB — URINE MICROSCOPIC-ADD ON

## 2014-10-28 MED ORDER — IRBESARTAN 150 MG PO TABS
150.0000 mg | ORAL_TABLET | Freq: Every day | ORAL | Status: DC
  Administered 2014-10-28 – 2014-10-31 (×4): 150 mg via ORAL
  Filled 2014-10-28 (×5): qty 1

## 2014-10-28 MED ORDER — CARVEDILOL 12.5 MG PO TABS
12.5000 mg | ORAL_TABLET | Freq: Two times a day (BID) | ORAL | Status: DC
  Administered 2014-10-28 – 2014-10-31 (×7): 12.5 mg via ORAL
  Filled 2014-10-28 (×9): qty 1

## 2014-10-28 MED ORDER — FUROSEMIDE 40 MG PO TABS
40.0000 mg | ORAL_TABLET | Freq: Every day | ORAL | Status: DC
  Administered 2014-10-28 – 2014-10-29 (×2): 40 mg via ORAL
  Filled 2014-10-28 (×3): qty 1

## 2014-10-28 NOTE — Progress Notes (Signed)
Rehab Admissions Coordinator Note:  Patient was screened by Cleatrice Burke for appropriateness for an Inpatient Acute Rehab Consult per PT recommendation.  At this time, we are recommending Inpatient Rehab consult and OT eval. [facility] would have to approve any rehab venue as appropriate, but pt has expressed an interest in [facility] rehab admission preoperatively and the assessment may clarify for pt and his family expectations of rehab and assist in motivation.  Cleatrice Burke 10/28/2014, 8:01 AM  I can be reached at [phone].

## 2014-10-28 NOTE — Progress Notes (Signed)
Lopressor 5mg  IV PRN given for SBP > 160

## 2014-10-28 NOTE — Clinical Social Work Note (Signed)
CSW continuing to follow patient's progress throughout discharge planning.  Current plan is to discharge home with home health or inpatient rehab.  Patient and family do not want to go to SNF, CSW to follow in case patient changes his mind.  Jones Broom. [place], MSW, [place] 10/28/2014 6:04 PM

## 2014-10-28 NOTE — Progress Notes (Signed)
Physical Therapy Treatment Patient Details Name: Travis Romero MRN: [id number] DOB: 1950/12/27 Today's Date: 10/28/2014    History of Present Illness Pt was admitted with progressive dyspnea and dx'd with CHF exacerbation.  He was intubated 7/8 - 7/11.  Pt has a h/o HTN, COPD, and back pain. Pt with AMS since extubation. 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     PT Comments    Motivated however very limited by retropulsion in standing and inability to center himself over his feet. SaO2 decreases to 87% and RR 34 with exertion and requires prolonged sitting breaks to return to 90% with RR24 (pt is able to perform pursed lip breathing independently).    Follow Up Recommendations  CIR     Equipment Recommendations   (TBD)    Recommendations for Other Services OT consult;Rehab consult     Precautions / Restrictions Precautions Precautions: Fall;Sternal Restrictions Weight Bearing Restrictions: No    Mobility  Bed Mobility                  Transfers Overall transfer level: Needs assistance Equipment used: 2 person hand held assist;Rolling walker (2 wheeled) Transfers: Sit to/from Stand Sit to Stand: +2 physical assistance;Mod assist;+2 safety/equipment         General transfer comment: Initiates with max cues, almost immediatel;y begins retropulsion and does not sense he is leaning/pushing backwards; x 2  Ambulation/Gait             General Gait Details: unable   Stairs            Wheelchair Mobility    Modified Rankin (Stroke Patients Only)       Balance Overall balance assessment: Needs assistance   Sitting balance-Leahy Scale: Fair     Standing balance support: Bilateral upper extremity supported Standing balance-Leahy Scale: Zero Standing balance comment: stood x 45 seconds x1 (strong retropulsion); x 90 seconds with RW for pericare with progressive retropulsion as fatigued                    Cognition Arousal/Alertness:  Awake/alert Behavior During Therapy: Flat affect Overall Cognitive Status: No family/caregiver present to determine baseline cognitive functioning Area of Impairment: Following commands;Problem solving;Attention;Memory;Awareness   Current Attention Level: Sustained Memory: Decreased short-term memory;Decreased recall of precautions Following Commands: Follows one step commands with increased time Safety/Judgement: Decreased awareness of safety;Decreased awareness of deficits Awareness: Intellectual Problem Solving: Slow processing;Difficulty sequencing;Requires verbal cues;Requires tactile cues General Comments: no recall of sternal precautions; repeated cues necessary on repeated sit to stand    Exercises General Exercises - Lower Extremity Ankle Circles/Pumps: AROM;Both;10 reps Long Arc Quad: AROM;Both;5 reps Hip Flexion/Marching: AROM;Both;5 reps    General Comments        Pertinent Vitals/Pain Pain Assessment: No/denies pain    Home Living                      Prior Function            PT Goals (current goals can now be found in the care plan section) Acute Rehab PT Goals Patient Stated Goal: none stated; agreeable to PT PT Goal Formulation: With patient Time For Goal Achievement: 11/02/14 Potential to Achieve Goals: Good Progress towards PT goals: Progressing toward goals    Frequency  Min 3X/week    PT Plan Current plan remains appropriate    Co-evaluation             End of Session Equipment Utilized During Treatment:  Gait belt;Oxygen Activity Tolerance: Patient limited by fatigue;Treatment limited secondary to medical complications (Comment) (incr RR and decr SaO2) Patient left: with call bell/phone within reach;in chair     Time: 1450-1513 PT Time Calculation (min) (ACUTE ONLY): 23 min  Charges:  $Therapeutic Activity: 23-37 mins                    G Codes:      Travis Romero 11-14-14, 5:36 PM Pager [phone]

## 2014-10-28 NOTE — Plan of Care (Signed)
Problem: Phase II - Intermediate Post-Op Goal: Activity Progressed Outcome: Progressing Pt remains unable to do minimal physical activity. Doing very minimal work with PT.

## 2014-10-28 NOTE — Progress Notes (Signed)
5 Days Post-Op Procedure(s) (LRB):   CORONARY ARTERY BYPASS GRAFTING (CABG)X3 LIMA-LAD;SVG to OM,SVG to Distal RCA (N/A) TRANSESOPHAGEAL ECHOCARDIOGRAM (TEE) (N/A) Subjective: "I'm tired" Pain control better  Objective: Vital signs in last 24 hours: Temp:  [97.5 F (36.4 C)-98.4 F (36.9 C)] 98 F (36.7 C) (07/25 0300) Pulse Rate:  [87-101] 99 (07/25 0600) Cardiac Rhythm:  [-] Sinus tachycardia (07/24 2000) Resp:  [12-36] 17 (07/25 0600) BP: (119-182)/(61-113) 181/83 mmHg (07/25 0600) SpO2:  [88 %-100 %] 98 % (07/25 0600) Weight:  [132 lb 15 oz (60.3 kg)] 132 lb 15 oz (60.3 kg) (07/25 0600)  Hemodynamic parameters for last 24 hours:    Intake/Output from previous day: 07/24 0701 - 07/25 0700 In: 2593 [P.O.:2280; I.V.:263; IV Piggyback:50] Out: 1975 [Urine:1975] Intake/Output this shift:    General appearance: cooperative and no distress Neurologic: intact Heart: regular rate and rhythm Lungs: diminished breath sounds bibasilar Abdomen: mildly distended, nontender, + BS Extremities: no edema Wound: clean and dry  Lab Results:  Recent Labs  10/27/14 0355 10/28/14 0208  WBC 13.4* 15.0*  HGB 9.1* 9.8*  HCT 28.3* 30.4*  PLT 313 355   BMET:  Recent Labs  10/27/14 0355 10/28/14 0208  NA 134* 136  K 4.7 4.4  CL 95* 95*  CO2 31 33*  GLUCOSE 118* 149*  BUN 21* 21*  CREATININE 1.14 1.13  CALCIUM 8.8* 8.9    PT/INR: No results for input(s): LABPROT, INR in the last 72 hours. ABG    Component Value Date/Time   PHART 7.342* 10/24/2014 0050   HCO3 23.4 10/24/2014 0050   TCO2 26 10/24/2014 2126   ACIDBASEDEF 2.0 10/24/2014 0050   O2SAT 59.3 10/26/2014 1050   CBG (last 3)   Recent Labs  10/27/14 1150 10/27/14 1709 10/27/14 2142  GLUCAP 203* 132* 124*    Assessment/Plan: S/P Procedure(s) (LRB):   CORONARY ARTERY BYPASS GRAFTING (CABG)X3 LIMA-LAD;SVG to OM,SVG to Distal RCA (N/A) TRANSESOPHAGEAL ECHOCARDIOGRAM (TEE) (N/A) -  CV- hypertension-  increase coreg and irbesartan  RESP_ bibasilar atelectasis, small left effusion in setting of severe COPD  He was started on ceftriaxone over the weekend for presumed pneumonia, will continue for now  Continue nebs, IS, flutter  RENAL- creatinine OK, mildly alkalotic secondary to diuresis  Will dc IV lasix and start 40 mg PO daily  ENDO- CBG elevated at lunch yesterday, otherwise well controlled  Deconditioning, severe- variable patient effort with rehab, continue PT   LOS: 16 days    Travis Romero 10/28/2014

## 2014-10-28 NOTE — Progress Notes (Signed)
Dr. Roxan Hockey at bedside. Informed of I/O cath volume. Instructed to place foley at midnight if pt still not voiding.

## 2014-10-28 NOTE — Progress Notes (Signed)
      [address]       [place] [address]             [phone]      Feels OK this PM  Worked with PT  BP 150/77 mmHg  Pulse 102  Temp(Src) 98.1 F (36.7 C) (Oral)  Resp 24  Ht 5\' 7"  (1.702 m)  Wt 132 lb 15 oz (60.3 kg)  BMI 20.82 kg/m2  SpO2 100%   Intake/Output Summary (Last 24 hours) at 10/28/14 1849 Last data filed at 10/28/14 1500  Gross per 24 hour  Intake   2063 ml  Output   1000 ml  Net   1063 ml    Slow progress  Remo Lipps C. Roxan Hockey, MD [facility] [phone]

## 2014-10-29 ENCOUNTER — Inpatient Hospital Stay (HOSPITAL_COMMUNITY): Payer: PRIVATE HEALTH INSURANCE

## 2014-10-29 LAB — CBC
HCT: 30.9 % — ABNORMAL LOW (ref 39.0–52.0)
Hemoglobin: 9.8 g/dL — ABNORMAL LOW (ref 13.0–17.0)
MCH: 35.9 pg — ABNORMAL HIGH (ref 26.0–34.0)
MCHC: 31.7 g/dL (ref 30.0–36.0)
MCV: 113.2 fL — AB (ref 78.0–100.0)
Platelets: 375 10*3/uL (ref 150–400)
RBC: 2.73 MIL/uL — ABNORMAL LOW (ref 4.22–5.81)
RDW: 14.3 % (ref 11.5–15.5)
WBC: 12.6 10*3/uL — ABNORMAL HIGH (ref 4.0–10.5)

## 2014-10-29 LAB — BASIC METABOLIC PANEL
ANION GAP: 6 (ref 5–15)
BUN: 19 mg/dL (ref 6–20)
CHLORIDE: 94 mmol/L — AB (ref 101–111)
CO2: 36 mmol/L — ABNORMAL HIGH (ref 22–32)
Calcium: 9 mg/dL (ref 8.9–10.3)
Creatinine, Ser: 0.87 mg/dL (ref 0.61–1.24)
GFR calc non Af Amer: >60 mL/min (ref >60)
Glucose, Bld: 125 mg/dL — ABNORMAL HIGH (ref 65–99)
POTASSIUM: 5 mmol/L (ref 3.5–5.1)
Sodium: 136 mmol/L (ref 135–145)

## 2014-10-29 LAB — GLUCOSE, CAPILLARY
GLUCOSE-CAPILLARY: 97 mg/dL (ref 65–99)
GLUCOSE-CAPILLARY: 105 mg/dL — AB (ref 65–99)
GLUCOSE-CAPILLARY: 108 mg/dL — AB (ref 65–99)

## 2014-10-29 LAB — BRAIN NATRIURETIC PEPTIDE: B Natriuretic Peptide: 1031 pg/mL — ABNORMAL HIGH (ref 0.0–100.0)

## 2014-10-29 LAB — PROCALCITONIN

## 2014-10-29 MED ORDER — MORPHINE SULFATE 2 MG/ML IJ SOLN: 2.0000 mg | INTRAMUSCULAR | Status: DC | PRN

## 2014-10-29 MED ORDER — MIRTAZAPINE 30 MG PO TABS
30.0000 mg | ORAL_TABLET | Freq: Every day | ORAL | Status: DC
  Administered 2014-10-29 – 2014-11-03 (×6): 30 mg via ORAL
  Filled 2014-10-29 (×7): qty 1

## 2014-10-29 MED ORDER — ACETYLCYSTEINE 20 % IN SOLN
4.0000 mL | Freq: Four times a day (QID) | RESPIRATORY_TRACT | Status: DC
  Administered 2014-10-29 – 2014-10-30 (×4): 4 mL via RESPIRATORY_TRACT
  Filled 2014-10-29 (×8): qty 4

## 2014-10-29 MED ORDER — PIPERACILLIN-TAZOBACTAM 3.375 G IVPB
3.3750 g | Freq: Three times a day (TID) | INTRAVENOUS | Status: DC
  Administered 2014-10-29 – 2014-10-30 (×4): 3.375 g via INTRAVENOUS
  Filled 2014-10-29 (×5): qty 50

## 2014-10-29 MED ORDER — ISRADIPINE 5 MG PO CAPS
10.0000 mg | ORAL_CAPSULE | Freq: Two times a day (BID) | ORAL | Status: DC
  Filled 2014-10-29 (×2): qty 2

## 2014-10-29 MED ORDER — LIDOCAINE HCL (PF) 1 % IJ SOLN
INTRAMUSCULAR | Status: AC
  Filled 2014-10-29: qty 10

## 2014-10-29 MED ORDER — AMLODIPINE BESYLATE 10 MG PO TABS
10.0000 mg | ORAL_TABLET | Freq: Two times a day (BID) | ORAL | Status: DC
  Administered 2014-10-29 – 2014-10-31 (×6): 10 mg via ORAL
  Filled 2014-10-29 (×8): qty 1

## 2014-10-29 MED ORDER — OXYCODONE HCL 5 MG PO TABS
5.0000 mg | ORAL_TABLET | Freq: Four times a day (QID) | ORAL | Status: DC | PRN
  Administered 2014-10-29 – 2014-10-30 (×2): 5 mg via ORAL
  Filled 2014-10-29 (×2): qty 1
  Filled 2014-10-29: qty 2

## 2014-10-29 MED ORDER — VANCOMYCIN HCL IN DEXTROSE 750-5 MG/150ML-% IV SOLN
750.0000 mg | Freq: Two times a day (BID) | INTRAVENOUS | Status: DC
  Administered 2014-10-29 (×2): 750 mg via INTRAVENOUS
  Filled 2014-10-29 (×3): qty 150

## 2014-10-29 NOTE — Progress Notes (Signed)
6 Days Post-Op Procedure(s) (LRB):   CORONARY ARTERY BYPASS GRAFTING (CABG)X3 LIMA-LAD;SVG to OM,SVG to Distal RCA (N/A) TRANSESOPHAGEAL ECHOCARDIOGRAM (TEE) (N/A) Subjective: Feels short of breath this AM  Objective: Vital signs in last 24 hours: Temp:  [97.4 F (36.3 C)-98.1 F (36.7 C)] 97.4 F (36.3 C) (07/26 0717) Pulse Rate:  [93-106] 103 (07/26 0700) Cardiac Rhythm:  [-] Sinus tachycardia (07/25 2000) Resp:  [13-26] 19 (07/26 0700) BP: (111-174)/(59-91) 174/91 mmHg (07/26 0700) SpO2:  [87 %-100 %] 100 % (07/26 0720) FiO2 (%):  [100 %] 100 % (07/26 0720) Weight:  [140 lb 6.9 oz (63.7 kg)] 140 lb 6.9 oz (63.7 kg) (07/26 0600)  Hemodynamic parameters for last 24 hours:    Intake/Output from previous day: 07/25 0701 - 07/26 0700 In: 1210 [P.O.:1160; IV Piggyback:50] Out: 600 [Urine:600] Intake/Output this shift:    General appearance: alert and mild distress Neurologic: no focal Heart: regular rate and rhythm Lungs: diminished breath sounds bilaterally Abdomen: normal findings: soft, non-tender Extremities: no edema Wound: clean and dry  Lab Results:  Recent Labs  10/28/14 0208 10/29/14 0252  WBC 15.0* 12.6*  HGB 9.8* 9.8*  HCT 30.4* 30.9*  PLT 355 375   BMET:  Recent Labs  10/28/14 0208 10/29/14 0252  NA 136 136  K 4.4 5.0  CL 95* 94*  CO2 33* 36*  GLUCOSE 149* 125*  BUN 21* 19  CREATININE 1.13 0.87  CALCIUM 8.9 9.0    PT/INR: No results for input(s): LABPROT, INR in the last 72 hours. ABG    Component Value Date/Time   PHART 7.342* 10/24/2014 0050   HCO3 23.4 10/24/2014 0050   TCO2 26 10/24/2014 2126   ACIDBASEDEF 2.0 10/24/2014 0050   O2SAT 59.3 10/26/2014 1050   CBG (last 3)   Recent Labs  10/28/14 1233 10/28/14 1555 10/28/14 2140  GLUCAP 165* 131* 90    Assessment/Plan: S/P Procedure(s) (LRB):   CORONARY ARTERY BYPASS GRAFTING (CABG)X3 LIMA-LAD;SVG to OM,SVG to Distal RCA (N/A) TRANSESOPHAGEAL ECHOCARDIOGRAM (TEE) (N/A) -   Placed on NRB mask this AM for dyspnea- no note in chart and I was not called  He has severe COPD. His BS are diminished bilaterally but that is unchanged  He is on ceftriaxone for presumed pneumonia- afebrile and WBC decreasing  Will continue nebs, add mucomyst for secretions  Check stat CXR  CV- still hypertensive- on coreg, clonidine, irbesartan  Restart isradipine  Will check BNP to see if there is any CHF, appears relatively dry to exam  RENAL- has not voided overnight- will replace Foley catheter  Creatinine OK, still alkalotic  ENDO- CBG reasonable control  Deconditioning- severe   LOS: 17 days    [place] Nakayama 10/29/2014  Addendum- CXR shows worsening LLL atelectasis/ infiltrate +/- effusion Will change antibiotics Consult Pulmonary Consider US guided thoracentesis if radiology feels there is a significant effusion

## 2014-10-29 NOTE — Progress Notes (Addendum)
PULMONARY / CRITICAL CARE MEDICINE  Name: Travis Romero MRN: [id number] DOB: 1950/04/25    ADMISSION DATE:  10/11/2014  PRIMARY SERVICE: PCCM  CHIEF COMPLAINT:  Worsening PNA  BRIEF PATIENT DESCRIPTION:  64 y/o male smoker with hx of refractory HTN, COPD, chronic pain, & ETOH abuse admitted 7/8 with progressive dyspnea from pulmonary edema and combined acute on chronic heart failure. Underwent L/R heart cath with 3 vessel disease.  Course complicated by AKI with aggressive diuresis.    SIGNIFICANT EVENTS:  7/08  Admit to [facility] for progressive SOB 7/10  Cardiology consulted, aggressive diuresis  7/11  Extubated 7/12  Transferred to [facility] for pending cardiac cath 7/13  R Thoracentesis > 850 ml clear fluid, neg culture 7/14  R & L heart cath >> multivessel (3) CAD  7/16  Negative 16L since admit 7/18  Sr cr rising with diuresis, lasix held 7/20  CABG x3 per Dr. Roxan Hockey, TEE with impared LV fxn, EF 30%, trace MR.  Extubated.  7/22  Ongoing atelectasis, effusions on CXR, BP elevated with tachycardia 7/24  Hypertensive, volume overload/ongoing diuresis, deconditioned 7/26  Developed worsening dyspnea, NRB. Diminished breath sounds, hypertensive  STUDIES: 7/09 Echo >> severe LVH, EF 20 to 42%, grade 1 diastolic dysfx, PAS 35 mmHg    SUBJECTIVE: Pt denies productive cough, sputum production, chest pain.  Indicates mild shortness of breath.  Feels tired.  Denies fevers/ chills.  Drowsy during exam.     VITAL SIGNS: Temp:  [97.4 F (36.3 C)-98.1 F (36.7 C)] 97.4 F (36.3 C) (07/26 0717) Pulse Rate:  [93-106] 98 (07/26 1000) Resp:  [13-26] 22 (07/26 1000) BP: (111-174)/(55-91) 120/55 mmHg (07/26 1000) SpO2:  [85 %-100 %] 85 % (07/26 1000) FiO2 (%):  [100 %] 100 % (07/26 0720) Weight:  [140 lb 6.9 oz (63.7 kg)] 140 lb 6.9 oz (63.7 kg) (07/26 0600)   VENTILATOR SETTINGS: Vent Mode:  [-]  FiO2 (%):  [100 %] 100 %   INTAKE / OUTPUT: Intake/Output      07/25 0701 - 07/26 0700  07/26 0701 - 07/27 0700   P.O. 1160    I.V. (mL/kg)     IV Piggyback 50    Total Intake(mL/kg) 1210 (19)    Urine (mL/kg/hr) 600 (0.4) 600 (2.8)   Stool 0 (0)    Total Output 600 600   Net +610 -600        Stool Occurrence 1 x      PHYSICAL EXAMINATION: General:  Thin elderly man lying in bed in NAD Neuro: Alert and oriented, follows commands, generalized weakness. CNII-XII grossly intact HEENT: 14mm PERRL brisk bilaterally, MM pink/dry Neck: no appreciable JVD Cardiovascular:  RRR, no rubs, gallops, or murmurs. No appreciable LE edema.  Pacing wires in place.  Lungs:  Mild dyspnea, diminished R lower, few crackles.  Left pleural friction rub, coarse.  Moist / weak cough Abdomen:  Soft, non-distended, nontender Musculoskeletal:  No deformities Skin:  Warm/dry, no edema  LABS:  PULMONARY  Recent Labs Lab 10/23/14 1444 10/23/14 1657 10/23/14 2214 10/23/14 2311 10/24/14 0050 10/24/14 1628 10/24/14 2126 10/26/14 1050  PHART 7.273* 7.352 7.391 7.380 7.342*  --   --   --   PCO2ART 49.8* 44.9 37.8 38.1 43.4  --   --   --   PO2ART 70.0* 42.0* 84.0 72.0* 71.0*  --   --   --   HCO3 23.2 24.9* 22.8 22.4 23.4  --   --   --   TCO2 25  26 24 24 25 25 26   --   O2SAT 92.0 75.0 96.0 93.0 92.0  --   --  59.3   CBC  Recent Labs Lab 10/27/14 0355 10/28/14 0208 10/29/14 0252  HGB 9.1* 9.8* 9.8*  HCT 28.3* 30.4* 30.9*  WBC 13.4* 15.0* 12.6*  PLT 313 355 375   COAGULATION  Recent Labs Lab 10/22/14 1122 10/23/14 1440  INR 0.98 1.23   CARDIAC   No results for input(s): TROPONINI in the last 168 hours. No results for input(s): PROBNP in the last 168 hours.  CHEMISTRY  Recent Labs Lab 10/23/14 2000  10/24/14 0408 10/24/14 1625  10/25/14 0410 10/26/14 1020 10/27/14 0355 10/28/14 0208 10/29/14 0252  NA  --   < > 132*  --   < > 131* 132* 134* 136 136  K  --   < > 5.1  --   < > 5.3* 4.7 4.7 4.4 5.0  CL  --   --  101  --   < > 96* 95* 95* 95* 94*  CO2  --   --  23   --   --  27 30 31  33* 36*  GLUCOSE  --   < > 112*  --   < > 105* 154* 118* 149* 125*  BUN  --   --  21*  --   < > 24* 22* 21* 21* 19  CREATININE 1.00  --  1.07 1.32*  < > 1.36* 1.17 1.14 1.13 0.87  CALCIUM  --   --  8.4*  --   --  8.8* 8.9 8.8* 8.9 9.0  MG 3.2*  --  2.7* 2.3  --   --   --   --   --   --   < > = values in this interval not displayed. Estimated Creatinine Clearance: 78.3 mL/min (by C-G formula based on Cr of 0.87).  LIVER  Recent Labs Lab 10/22/14 1122 10/22/14 1522 10/23/14 1440  AST  --  21  --   ALT  --  46  --   ALKPHOS  --  85  --   BILITOT  --  0.3  --   PROT  --  5.1*  --   ALBUMIN  --  2.0*  --   INR 0.98  --  1.23   INFECTIOUS No results for input(s): LATICACIDVEN, PROCALCITON in the last 168 hours.  Imaging Dg Chest Port 1 View  10/29/2014   CLINICAL DATA:  Hypoxia, COPD, pneumonia and status post CABG.  EXAM: PORTABLE CHEST - 1 VIEW  COMPARISON:  10/28/2014  FINDINGS: There is some increase in interstitial edema since the prior study with component of bilateral small to moderate pleural effusions. Bibasilar opacities, right greater than left remain with potentially component of pneumonia. No pneumothorax identified. The heart size and mediastinal contours are stable.  IMPRESSION: Increase in interstitial edema. Small to moderate bilateral pleural effusions. Bibasilar opacities remain, right greater than left, with potentially component of pneumonia.   Electronically Signed   By: Aletta Edouard M.D.   On: 10/29/2014 08:17   Dg Chest Port 1 View  10/28/2014   CLINICAL DATA:  CABG  EXAM: PORTABLE CHEST - 1 VIEW  COMPARISON:  10/27/2014  FINDINGS: Mild improvement in bibasilar atelectasis/ infiltrate. Improvement in small pleural effusions bilaterally. Bleb overlying the right medial lung unchanged from prior studies. No pneumothorax.  Left subclavian central venous catheter has been removed.  IMPRESSION: Mild improvement in bibasilar atelectasis/infiltrate and  small pleural effusions. Improvement in vascular congestion.   Electronically Signed   By: Franchot Gallo M.D.   On: 10/28/2014 07:41     ASSESSMENT / PLAN:  PULMONARY ETT 7/08 >> 7/11, 7/20 >> 7/20 (CABG) A: Hypoxic Respiratory Failure - required NRB early am 7/26, suspect in setting of compressive atelectasis with bilateral pleural effusions Bilateral Pleural Effusions - s/p R thora 7/13, L thora 7/26 with 1.5 L removed.  CXR significantly improved post thoracentesis 7/26.  Suspect element of effusion on R.   Hx of COPD. Tobacco abuse. 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  P:   ASA Norvasc, Coreg, Clonidine, irbesartan for BP control Lasix 40 mg QD  Lipitor  PRN Lopressor  ICU monitoring  RENAL A: AKI - resolved.  P:   Trend BMP / UOP Replace electrolytes as indicated   GASTROINTESTINAL / GU A: BPH  At Risk Malnutrition - post op CABG, deconditioning  Constipation  P:   Heart healthy, low sodium diet  Colace daily  PRN dulcolax  Consider d/c protonix in am 7/27 as not  HEMATOLOGIC A: Leukocytosis >> improved. P:   Intermittent cbcs SQ heparin for DVT prevention  INFECTIOUS A: Possible HCAP - suspect effusions but can not exclude infiltrate on CXR.  WBC reducing.  Former smoker and debilitated.   P: Vanco, start date 7/26, D1/x Zosyn, start date 7/26, D1/x  Trend fever curve / leukocytosis PCT  Follow pleural fluid   ENDOCRINE A: Hyperglycemia  P:   SSI   NEUROLOGIC A: Hx of back pain. Hx  of ETOH. P:   Continue home xanax  Reduce frequency of narcotics Reduce dose of Remeron from 45 to 30 mg QHS 7/26 Minimize sedation as able, pt very drowsy on exam  Physical therapy  and occupational therapy   Noe Gens, NP-C [facility] Pgr: [phone] or if no answer [phone] 10/29/2014, 10:23 AM  Attending Note:  64 year old male smoker who only quit right before coming into the hospital who presented with chest pain to [place].  Transferred to [facility] where he underwent a cardiac cath followed by a CABG.  Now back to the ICU for hypoxemic respiratory failure and bilateral pleural effusion.  Patient underwent a left sided thoracentesis that yielded a 1.5 liter of bloody effusion that unfortunately was not sent for analysis.  CXR I reviewed myself and patient has a right sided effusion as well that will likely need to be tapped and sent for analysis.    The respiratory failure is likely a combination of pulmonary edema, large volume pleural effusions and COPD with very high volume of thick secretions that patient is unable to expectorate.  Pleural effusion that is bloody is usually either traumatic or post cardiotomy syndrome.  Recommendation:  - Hold in the ICU overnight given high risk for respiratory failure.  - Supplemental O2 for sat of 88-92%.  - Vibravest.  - Mucomyst nebs.  - COPD treatment as ordered with three inhaler.  - Will retap right and if  consistent with post cardiotomy syndrome will need NSAID treatment if ok with CVTS.  - Added vancomycin to zosyn for HCAP coverage.  - Will continue to follow.  - Diureses as ordered.  Discussed with Dr. Roxan Hockey and PCCM-NP.  The patient is critically ill with multiple organ systems failure and requires high complexity decision making for assessment and support, frequent evaluation and titration of therapies, application of advanced monitoring technologies and extensive interpretation of multiple  databases.   Critical Care Time devoted to patient care services described in this note is  35  Minutes. This time reflects time of care of this signee Dr Jennet Maduro. This critical care time does not reflect procedure time, or teaching time or supervisory time of PA/NP/Med student/Med Resident etc but could involve care discussion time.  Rush Farmer, M.D. [facility] Pulmonary/Critical Care Medicine. Pager: [phone]. After hours pager: [phone].

## 2014-10-29 NOTE — Progress Notes (Signed)
TCTS BRIEF SICU PROGRESS NOTE  6 Days Post-Op  S/P Procedure(s) (LRB):   CORONARY ARTERY BYPASS GRAFTING (CABG)X3 LIMA-LAD;SVG to OM,SVG to Distal RCA (N/A) TRANSESOPHAGEAL ECHOCARDIOGRAM (TEE) (N/A)   Stable day Asking for oxycodone NSR w/ BP under good control Breathing comfortably w/ sats 98-100% on 6 L/min UOP adequate  Plan: Continue current plan  Rexene Alberts 10/29/2014 8:10 PM

## 2014-10-29 NOTE — Progress Notes (Signed)
Pt c/o SOB, dyspnea at rest 5 minutes s/p tx. Placed on documented settings for increased SOB/WOB. Pt is resting comfortably at this time. Plan to reassess after some time and return to 6L  if possible. VSS and NAD noted at this time.

## 2014-10-29 NOTE — Progress Notes (Signed)
Nutrition Follow-up  DOCUMENTATION CODES:   Not applicable  INTERVENTION:   No nutrition intervention at this time --- patient declined  NUTRITION DIAGNOSIS:   Increased nutrient needs related to wound healing (surgery) as evidenced by estimated needs, ongoing  GOAL:   Patient will meet greater than or equal to 90% of their needs, progressing  MONITOR:   PO intake, Weight trends, Labs, I & O's  ASSESSMENT:   Pt with hx of severe COPD and CHF who was admitted with dyspnea.   Patient s/p procedure 7/20: CORONARY ARTERY BYPASS GRAFTING (CABG ) x 3   Pt reports his appetite OK.  Not happy with his lunch meal.  Stated it was "garbage".  PO intake variable at 25-100% per flowsheet records.  Nutrient needs increased given post-op state.  RD offered oral nutrition supplements, however, pt declined.  Diet Order:  Diet heart healthy/carb modified Room service appropriate?: Yes; Fluid consistency:: Thin  Skin:  Wound (see comment) (blister, excoriated, skin tear to leg)  Last BM:  7/25  Height:   Ht Readings from Last 1 Encounters:  10/12/14 5\' 7"  (1.702 m)    Weight:   Wt Readings from Last 1 Encounters:  10/29/14 140 lb 6.9 oz (63.7 kg)    Ideal Body Weight:  67.3 kg (kg)  Wt Readings from Last 10 Encounters:  10/29/14 140 lb 6.9 oz (63.7 kg)  10/07/14 151 lb (68.493 kg)    BMI:  Body mass index is 21.99 kg/(m^2).  Estimated Nutritional Needs:   Kcal:  1700-1900  Protein:  90-100 gm  Fluid:  1.7-1.9 L  EDUCATION NEEDS:   No education needs identified at this time  Arthur Holms, RD, LDN Pager #: [phone] After-Hours Pager #: [phone]

## 2014-10-29 NOTE — Progress Notes (Signed)
Utilization Review Completed.  

## 2014-10-29 NOTE — Progress Notes (Signed)
ANTIBIOTIC CONSULT NOTE - INITIAL  Pharmacy Consult for vancomycin Indication: possible HCAP  Allergies  Allergen Reactions  . Advair Diskus [Fluticasone-Salmeterol] Other (See Comments)    Exacerbated BPH   . Flexeril [Cyclobenzaprine] Other (See Comments)    Caused shortness of breath and swelling in scrotum    Patient Measurements: Height: 5\' 7"  (170.2 cm) Weight: 140 lb 6.9 oz (63.7 kg) IBW/kg (Calculated) : 66.1   Vital Signs: Temp: 97.9 F (36.6 C) (07/26 1123) Temp Source: Axillary (07/26 1123) BP: 120/55 mmHg (07/26 1000) Pulse Rate: 98 (07/26 1000) Intake/Output from previous day: 07/25 0701 - 07/26 0700 In: 1210 [P.O.:1160; IV Piggyback:50] Out: 600 [Urine:600] Intake/Output from this shift: Total I/O In: 170 [P.O.:120; IV Piggyback:50] Out: 600 [Urine:600]  Labs:  Recent Labs  10/27/14 0355 10/28/14 0208 10/29/14 0252  WBC 13.4* 15.0* 12.6*  HGB 9.1* 9.8* 9.8*  PLT 313 355 375  CREATININE 1.14 1.13 0.87   Estimated Creatinine Clearance: 78.3 mL/min (by C-G formula based on Cr of 0.87). No results for input(s): VANCOTROUGH, VANCOPEAK, VANCORANDOM, GENTTROUGH, GENTPEAK, GENTRANDOM, TOBRATROUGH, TOBRAPEAK, TOBRARND, AMIKACINPEAK, AMIKACINTROU, AMIKACIN in the last 72 hours.   Microbiology: Recent Results (from the past 720 hour(s))  MRSA PCR Screening     Status: None   Collection Time: 10/12/14  5:29 AM  Result Value Ref Range Status   MRSA by PCR NEGATIVE NEGATIVE Final    Comment:        The GeneXpert MRSA Assay (FDA approved for NASAL specimens only), is one component of a comprehensive MRSA colonization surveillance program. It is not intended to diagnose MRSA infection nor to guide or monitor treatment for MRSA infections.   Culture, body fluid-bottle     Status: None   Collection Time: 10/16/14  2:10 PM  Result Value Ref Range Status   Specimen Description FLUID PLEURAL  Final   Special Requests BOTTLES DRAWN AEROBIC ONLY 5CC   Final   Culture   Final    NO GROWTH 5 DAYS Performed at [facility]    Report Status 10/21/2014 FINAL  Final  Gram stain     Status: None   Collection Time: 10/16/14  2:10 PM  Result Value Ref Range Status   Specimen Description FLUID PLEURAL  Final   Special Requests BOTTLES DRAWN AEROBIC ONLY 5CC  Final   Gram Stain   Final    WBC PRESENT,BOTH PMN AND MONONUCLEAR NO ORGANISMS SEEN CYTOSPIN SLIDE Performed at [facility]    Report Status 10/24/2014 FINAL  Final  Surgical pcr screen     Status: None   Collection Time: 10/23/14  3:09 AM  Result Value Ref Range Status   MRSA, PCR NEGATIVE NEGATIVE Final   Staphylococcus aureus NEGATIVE NEGATIVE Final    Comment:        The Xpert SA Assay (FDA approved for NASAL specimens in patients over 3 years of age), is one component of a comprehensive surveillance program.  Test performance has been validated by [facility] for patients greater than or equal to 22 year old. It is not intended to diagnose infection nor to guide or monitor treatment.     Medical History: Past Medical History  Diagnosis Date  . Hypertension   . COPD (chronic obstructive pulmonary disease)   . BPH (benign prostatic hyperplasia)   . Back pain 2007    after fall down steps    Medications:  Prescriptions prior to admission  Medication Sig Dispense Refill Last Dose  .  ALPRAZolam (XANAX) 0.5 MG tablet TAKE 1 TABLET BY MOUTH 3 TIMES A DAY AS NEEDED FOR 90 DAYS  0 10/10/2014 at Unknown time  . benzonatate (TESSALON) 100 MG capsule Take 100-200 mg by mouth 3 (three) times daily as needed for cough.   10/10/2014 at Unknown time  . cloNIDine (CATAPRES) 0.2 MG tablet Take 0.2 mg by mouth 4 (four) times daily as needed.    10/10/2014 at Unknown time  . dutasteride (AVODART) 0.5 MG capsule Take 0.5 mg by mouth daily.  0 10/10/2014 at Unknown time  . Ipratropium-Albuterol (COMBIVENT IN) Inhale into the lungs.   Taking  . isradipine (DYNACIRC) 5 MG  capsule Take 10 mg by mouth 2 (two) times daily.  1 10/10/2014 at Unknown time  . mirtazapine (REMERON) 45 MG tablet Take 45 mg by mouth at bedtime.   10/10/2014 at Unknown time  . spironolactone (ALDACTONE) 50 MG tablet Take 75 mg by mouth daily.   10/10/2014 at Unknown time  . telmisartan (MICARDIS) 80 MG tablet Take 80 mg by mouth daily.  1 10/10/2014 at Unknown time  . traMADol (ULTRAM) 50 MG tablet Take 1 tablet (50 mg total) by mouth every 8 (eight) hours as needed. 30 tablet 0 10/10/2014 at Unknown time  . cyclobenzaprine (FLEXERIL) 10 MG tablet Take 1 tablet (10 mg total) by mouth 3 (three) times daily as needed for muscle spasms. (Patient not taking: Reported on 10/11/2014) 30 tablet 0 Not Taking at Unknown time   Assessment: 64 yo M POD #6 s/p  3V CABG.  On day #4 of Rocephin for PNA.  Developed worsening dyspnea am 7/26 requiring NRB in setting of compressive atelectasis w/ B pleural effusions.  S/p R thora 7/13 and L thora today 7/26 with 1.5 L removed.  CXR significantly improved s/p thora 7/26.  Pharmacy consulted to dose vanc/zosyn for possible HCAP - MD suspect effusions but cannot exclude infiltrate on CXR.   Wt 63.7 kg, WBC trending down, 12.6 today, creat 0.87.  AF.   7/23 Ceftriaxone >>7/26 7/26 zosyn>> 7/26 vanc>>   7/9 and again 7/20 MRSA screen neg  7/13 pleural fluid - neg  Goal of Therapy:  Vancomycin trough level 15-20 mcg/ml  Plan:  -vancomycin 750 mg IV q12 -zosyn 3.375 gm IV q8h, infuse each dose over 4 hours -f/u renal fxn, wbc, temp, culture data -vancomycin trough as needed -we have substituted amlodipine 10 mg bid for isradipine 10 mg bid - FYI  Eudelia Bunch, Pharm.D. [phone] 10/29/2014 1:24 PM

## 2014-10-29 NOTE — Progress Notes (Signed)
Physical Therapy Treatment Patient Details Name: Travis Romero MRN: [id number] DOB: [birth year] Today's Date: 10/29/2014    History of Present Illness Pt was admitted with progressive dyspnea and dx'd with CHF exacerbation.  He was intubated 7/8 - 7/11.  Pt has a h/o HTN, COPD, and back pain. Pt with AMS since extubation. 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     PT Comments    From respiratory standpoint he tolerated activity better today with fewer and shorter rest periods. Remains confused, slow to process, difficulty following multi-step commands and severe retropulsion with standing (pt pushing backwards as he feels he is falling forwards). Can see this behavior with pts who have been supine for prolonged period or neuro deficits (ex. Cerebellar CVA). His mobility is currently severely impaired due to retropulsion causing decr safety (for pt and staff) with attempts to stand or walk.  Follow Up Recommendations  CIR     Equipment Recommendations   (TBD)    Recommendations for Other Services OT consult;Rehab consult     Precautions / Restrictions Precautions Precautions: Fall;Sternal Restrictions Weight Bearing Restrictions: No    Mobility  Bed Mobility Overal bed mobility: Needs Assistance;+2 for physical assistance Bed Mobility: Supine to Sit     Supine to sit: Mod assist;+2 for safety/equipment;HOB elevated     General bed mobility comments: HOB elevated on arrival; due to respiratory status, chose not to lie flat and pivoted to EOB from supine; Assisted patient to sit on the edge of the bed by providing facilitation to activate the patient's trunk and extremities.    Transfers Overall transfer level: Needs assistance Equipment used: Rolling walker (2 wheeled) Transfers: Sit to/from [facility] Sit to Stand: +2 physical assistance;Mod assist;+2 safety/equipment Stand pivot transfers: +2 physical assistance;Max assist       General transfer comment:  Initiates with max cues, initially min assist for anterior translation, however as begins to extend hips and knees immediately begins retropulsion and does not sense he is leaning/pushing backwards; x 2  Ambulation/Gait Ambulation/Gait assistance: Max assist;+2 physical assistance;+2 safety/equipment Ambulation Distance (Feet): 2 Feet Assistive device: Rolling walker (2 wheeled) Gait Pattern/deviations: Shuffle (retroopulsion)     General Gait Details: retropulsion requires 2 person assist and constant cues to work towards upright/vertical over his BOS   Stairs            Wheelchair Mobility    Modified Rankin (Stroke Patients Only)       Balance Overall balance assessment: Needs assistance Sitting-balance support: No upper extremity supported;Feet supported Sitting balance-Leahy Scale: Fair Sitting balance - Comments: feet unsupported initially and mod assist to maintain balance (posterior lean) Postural control: Posterior lean Standing balance support: Bilateral upper extremity supported Standing balance-Leahy Scale: Zero Standing balance comment: stood x 60 seconds for pericare with RW progressing from mod assist of one to max assist of 2 due to retropulsion                    Cognition Arousal/Alertness: Awake/alert Behavior During Therapy: Anxious Overall Cognitive Status: No family/caregiver present to determine baseline cognitive functioning Area of Impairment: Following commands;Problem solving;Attention;Memory;Awareness   Current Attention Level: Sustained Memory: Decreased short-term memory Following Commands: Follows one step commands with increased time (short instructions better) Safety/Judgement: Decreased awareness of safety;Decreased awareness of deficits Awareness: Intellectual Problem Solving: Slow processing;Difficulty sequencing;Requires verbal cues;Requires tactile cues General Comments: able to state sternal precautions; slow processing;  cannot follow multi-step or complex instructions    Exercises  General Comments        Pertinent Vitals/Pain RR22-30 SaO2 89-96% on 5L throughout (most of time >90%)  Pain Assessment: No/denies pain    Home Living                      Prior Function            PT Goals (current goals can now be found in the care plan section) Acute Rehab PT Goals Patient Stated Goal: none stated; agreeable to PT PT Goal Formulation: With patient Time For Goal Achievement: 11/02/14 Potential to Achieve Goals: Good Progress towards PT goals: Progressing toward goals    Frequency  Min 3X/week    PT Plan Current plan remains appropriate    Co-evaluation             End of Session Equipment Utilized During Treatment: Gait belt;Oxygen Activity Tolerance: Patient limited by fatigue Patient left: with call bell/phone within reach;in chair     Time: 1451-1517 PT Time Calculation (min) (ACUTE ONLY): 26 min  Charges:  $Therapeutic Activity: 23-37 mins                    G Codes:      [id number] 11-26-14, 3:37 PM Pager [phone]

## 2014-10-29 NOTE — Progress Notes (Signed)
OT Cancellation Note  Patient Details Name: Travis Romero MRN: [id number] DOB: 1951/04/03   Cancelled Treatment:    Reason Eval/Treat Not Completed: Patient at procedure or test/ unavailable. Per pt's nurse he is going for thorocentesis any time now. Will try to see later as schedule allows.  Almon Register [phone] 10/29/2014, 9:11 AM

## 2014-10-29 NOTE — Procedures (Signed)
Successful US guided left thoracentesis. Yielded 1.5 Liters of dark bloody fluid. Pt tolerated procedure well. No immediate complications.  Specimen was not sent for labs. CXR ordered.  Kimbree Casanas S Beckam Abdulaziz PA-C 10/29/2014 11:13 AM

## 2014-10-30 ENCOUNTER — Inpatient Hospital Stay (HOSPITAL_COMMUNITY): Payer: PRIVATE HEALTH INSURANCE

## 2014-10-30 DIAGNOSIS — I509 Heart failure, unspecified: Secondary | ICD-10-CM

## 2014-10-30 LAB — GLUCOSE, CAPILLARY
GLUCOSE-CAPILLARY: 104 mg/dL — AB (ref 65–99)
GLUCOSE-CAPILLARY: 126 mg/dL — AB (ref 65–99)
GLUCOSE-CAPILLARY: 137 mg/dL — AB (ref 65–99)
Glucose-Capillary: 79 mg/dL (ref 65–99)
Glucose-Capillary: 167 mg/dL — ABNORMAL HIGH (ref 65–99)

## 2014-10-30 LAB — CBC
HEMATOCRIT: 29.1 % — AB (ref 39.0–52.0)
HEMOGLOBIN: 9.2 g/dL — AB (ref 13.0–17.0)
MCH: 35.8 pg — AB (ref 26.0–34.0)
MCHC: 31.6 g/dL (ref 30.0–36.0)
MCV: 113.2 fL — ABNORMAL HIGH (ref 78.0–100.0)
Platelets: 425 10*3/uL — ABNORMAL HIGH (ref 150–400)
RBC: 2.57 MIL/uL — AB (ref 4.22–5.81)
RDW: 14.7 % (ref 11.5–15.5)
WBC: 12.8 10*3/uL — ABNORMAL HIGH (ref 4.0–10.5)

## 2014-10-30 LAB — COMPREHENSIVE METABOLIC PANEL
ALBUMIN: 2 g/dL — AB (ref 3.5–5.0)
ALK PHOS: 66 U/L (ref 38–126)
ALT: 22 U/L (ref 17–63)
ANION GAP: 4 — AB (ref 5–15)
AST: 15 U/L (ref 15–41)
BUN: 21 mg/dL — ABNORMAL HIGH (ref 6–20)
CO2: 37 mmol/L — ABNORMAL HIGH (ref 22–32)
CREATININE: 1.1 mg/dL (ref 0.61–1.24)
Calcium: 8.7 mg/dL — ABNORMAL LOW (ref 8.9–10.3)
Chloride: 92 mmol/L — ABNORMAL LOW (ref 101–111)
GFR calc Af Amer: >60 mL/min (ref >60)
GFR calc non Af Amer: >60 mL/min (ref >60)
Glucose, Bld: 155 mg/dL — ABNORMAL HIGH (ref 65–99)
Potassium: 4.8 mmol/L (ref 3.5–5.1)
Sodium: 133 mmol/L — ABNORMAL LOW (ref 135–145)
TOTAL PROTEIN: 4.8 g/dL — AB (ref 6.5–8.1)
Total Bilirubin: 0.6 mg/dL (ref 0.3–1.2)

## 2014-10-30 LAB — BODY FLUID CELL COUNT WITH DIFFERENTIAL
Lymphs, Fluid: 47 %
Monocyte-Macrophage-Serous Fluid: 40 % — ABNORMAL LOW (ref 50–90)
Neutrophil Count, Fluid: 13 % (ref 0–25)
Total Nucleated Cell Count, Fluid: 758 cu mm (ref 0–1000)

## 2014-10-30 LAB — LACTATE DEHYDROGENASE, PLEURAL OR PERITONEAL FLUID: LD FL: 174 U/L — AB (ref 3–23)

## 2014-10-30 LAB — PROTEIN, TOTAL: Total Protein: 5.3 g/dL — ABNORMAL LOW (ref 6.5–8.1)

## 2014-10-30 LAB — PROTEIN, BODY FLUID: Total protein, fluid: <3 g/dL

## 2014-10-30 LAB — CHOLESTEROL, TOTAL: CHOLESTEROL: 101 mg/dL (ref 0–200)

## 2014-10-30 LAB — LACTATE DEHYDROGENASE: LDH: 212 U/L — AB (ref 98–192)

## 2014-10-30 LAB — PROCALCITONIN: Procalcitonin: <0.1 ng/mL

## 2014-10-30 MED ORDER — FUROSEMIDE 10 MG/ML IJ SOLN
40.0000 mg | Freq: Two times a day (BID) | INTRAMUSCULAR | Status: DC
  Administered 2014-10-30 – 2014-10-31 (×3): 40 mg via INTRAVENOUS
  Filled 2014-10-30 (×5): qty 4

## 2014-10-30 MED ORDER — FLEET ENEMA 7-19 GM/118ML RE ENEM
1.0000 | ENEMA | Freq: Once | RECTAL | Status: AC
  Administered 2014-10-30: 1 via RECTAL
  Filled 2014-10-30: qty 1

## 2014-10-30 MED ORDER — BUDESONIDE 0.5 MG/2ML IN SUSP
0.5000 mg | Freq: Two times a day (BID) | RESPIRATORY_TRACT | Status: DC
  Administered 2014-10-30 – 2014-11-04 (×11): 0.5 mg via RESPIRATORY_TRACT
  Filled 2014-10-30 (×13): qty 2

## 2014-10-30 MED ORDER — LEVALBUTEROL HCL 0.63 MG/3ML IN NEBU: 0.6300 mg | INHALATION_SOLUTION | Freq: Four times a day (QID) | RESPIRATORY_TRACT | Status: DC | PRN

## 2014-10-30 MED ORDER — ARFORMOTEROL TARTRATE 15 MCG/2ML IN NEBU
15.0000 ug | INHALATION_SOLUTION | Freq: Two times a day (BID) | RESPIRATORY_TRACT | Status: DC
  Administered 2014-10-30 – 2014-11-04 (×11): 15 ug via RESPIRATORY_TRACT
  Filled 2014-10-30 (×13): qty 2

## 2014-10-30 NOTE — Progress Notes (Signed)
CT surgery p.m. Rounds  Patient's pulmonary status improved, currently at 100% saturation Breath sounds only slightly coarse to exam Maintaining sinus rhythm stable vital signs Appreciate input of critical care-pulmonary medicine

## 2014-10-30 NOTE — Progress Notes (Signed)
Patient stated he had a bowel movement at 1230, upon turning the patient over there was a smear but he continually yelled "get it out".  Surgilube was obtained and the patient was digitally disimpacted, but the disimpaction was incomplete.  At 1300 the patient again complained he had a bowel movement, upon turning the patient had another smear and asked to "get it out".  Again, the patient was digitally disimpacted with Surgilube, but the disimpaction was incomplete.

## 2014-10-30 NOTE — Care Management Note (Signed)
Case Management Note  Patient Details  Name: Travis Romero MRN: [id number] Date of Birth: [birth year]  Subjective/Objective:   TC to sister, Lyda Jester ([phone]), to discuss d/c plan per pt request.  Also talked with Carrington Clamp, pt's cousin.  [name] lives in [place] and is staying with cousin temporarily while pt is in hospital.  Both state they expect pt to need rehab before returning home as he lives in a second floor apt with no elevator.  Both also state they will not be able to provide 24/7 assistance to pt.  CSW notified.                 Action/Plan: CIR vs SNF for rehab   In-House Referral:  Clinical Social Work  Discharge [place]   [name], [place] T, [place] 10/30/2014, 7:20 AM

## 2014-10-30 NOTE — Procedures (Signed)
Thoracentesis Procedure Note  Pre-operative Diagnosis: R > L pleural effusion  Post-operative Diagnosis: Same  Indications: Shortness of breath  Procedure Details  Consent: Informed consent was obtained. Risks of the procedure were discussed including: infection, bleeding, pain, pneumothorax.  Under sterile conditions the patient was positioned. Betadine solution and sterile drapes were utilized.  1% buffered lidocaine was used to anesthetize the 8th rib space. Fluid was obtained without any difficulties and minimal blood loss.  A dressing was applied to the wound and wound care instructions were provided.   Findings 1200 ml of cloudy amber pleural fluid was obtained. A sample was sent to Pathology for cytogenetics, flow, and cell counts, as well as for infection analysis.  Complications:  None; patient tolerated the procedure well.          Condition: stable  Plan A follow up chest x-ray was ordered. Tylenol 650 mg. for pain.   Georgann Housekeeper, AGACNP-BC [facility] Pulmonology/Critical Care Pager [phone] or [phone]  10/30/2014 11:31 AM   Attending:  We performed the procedure together  Roselie Awkward, MD [place] PCCM Pager: [phone] Cell: [phone] After 3pm or if no response, call [phone]

## 2014-10-30 NOTE — Progress Notes (Signed)
PULMONARY / CRITICAL CARE MEDICINE  Name: Travis Romero MRN: [id number] DOB: Sep 03, 1950    ADMISSION DATE:  10/11/2014  PRIMARY SERVICE: PCCM  CHIEF COMPLAINT:  Worsening PNA  BRIEF PATIENT DESCRIPTION:  64 y/o male smoker with hx of refractory HTN, COPD, chronic pain, & ETOH abuse admitted 7/8 with progressive dyspnea from pulmonary edema and combined acute on chronic heart failure. Underwent L/R heart cath with 3 vessel disease.  Course complicated by AKI with aggressive diuresis.    SIGNIFICANT EVENTS:  7/08  Admit to [facility] for progressive SOB 7/10  Cardiology consulted, aggressive diuresis  7/11  Extubated 7/12  Transferred to [facility] for pending cardiac cath 7/13  R Thoracentesis > 850 ml clear fluid, neg culture 7/14  R & L heart cath >> multivessel (3) CAD  7/16  Negative 16L since admit 7/18  Sr cr rising with diuresis, lasix held 7/20  CABG x3 per Dr. Roxan Hockey, TEE with impared LV fxn, EF 30%, trace MR.  Extubated.  7/22  Ongoing atelectasis, effusions on CXR, BP elevated with tachycardia 7/24  Hypertensive, volume overload/ongoing diuresis, deconditioned 7/26  Developed worsening dyspnea, NRB. Diminished breath sounds, hypertensive; L thoracentesis performed with 1.5 L removed, described as "dark bloody fluid" by radiology  STUDIES: 7/09 Echo >> severe LVH, EF 20 to 03%, grade 1 diastolic dysfx, PAS 35 mmHg    SUBJECTIVE: Had thoracentesis yesterday, breathing better today   VITAL SIGNS: Temp:  [97.9 F (36.6 C)-98.1 F (36.7 C)] 98 F (36.7 C) (07/27 0822) Pulse Rate:  [40-101] 96 (07/27 0600) Resp:  [13-27] 13 (07/27 0600) BP: (94-148)/(55-75) 142/71 mmHg (07/27 0600) SpO2:  [85 %-100 %] 100 % (07/27 0815) Weight:  [61.9 kg (136 lb 7.4 oz)] 61.9 kg (136 lb 7.4 oz) (07/27 0500)   VENTILATOR SETTINGS:     INTAKE / OUTPUT: Intake/Output      07/26 0701 - 07/27 0700 07/27 0701 - 07/28 0700   P.O. 300    IV Piggyback 250    Total Intake(mL/kg) 550 (8.9)    Urine (mL/kg/hr) 1945 (1.3)    Stool     Total Output 1945     Net -1395            PHYSICAL EXAMINATION:  Gen: chronically ill appearing HENT: OP clear, TM's clear, neck supple PULM: Diminished R base, clear left, good air movement, no wheezing CV: RRR, midline scar well healed, no mgr, trace edema GI: BS+, soft, nontender Derm: no cyanosis or rash Psyche: normal mood and affect   LABS:  PULMONARY  Recent Labs Lab 10/23/14 1444 10/23/14 1657 10/23/14 2214 10/23/14 2311 10/24/14 0050 10/24/14 1628 10/24/14 2126 10/26/14 1050  PHART 7.273* 7.352 7.391 7.380 7.342*  --   --   --   PCO2ART 49.8* 44.9 37.8 38.1 43.4  --   --   --   PO2ART 70.0* 42.0* 84.0 72.0* 71.0*  --   --   --   HCO3 23.2 24.9* 22.8 22.4 23.4  --   --   --   TCO2 25 26 24 24 25 25 26   --   O2SAT 92.0 75.0 96.0 93.0 92.0  --   --  59.3   CBC  Recent Labs Lab 10/28/14 0208 10/29/14 0252 10/30/14 0225  HGB 9.8* 9.8* 9.2*  HCT 30.4* 30.9* 29.1*  WBC 15.0* 12.6* 12.8*  PLT 355 375 425*   COAGULATION  Recent Labs Lab 10/23/14 1440  INR 1.23   CARDIAC   No results for  input(s): TROPONINI in the last 168 hours. No results for input(s): PROBNP in the last 168 hours.  CHEMISTRY  Recent Labs Lab 10/23/14 2000  10/24/14 0408 10/24/14 1625  10/26/14 1020 10/27/14 0355 10/28/14 0208 10/29/14 0252 10/30/14 0225  NA  --   < > 132*  --   < > 132* 134* 136 136 133*  K  --   < > 5.1  --   < > 4.7 4.7 4.4 5.0 4.8  CL  --   --  101  --   < > 95* 95* 95* 94* 92*  CO2  --   --  23  --   < > 30 31 33* 36* 37*  GLUCOSE  --   < > 112*  --   < > 154* 118* 149* 125* 155*  BUN  --   --  21*  --   < > 22* 21* 21* 19 21*  CREATININE 1.00  --  1.07 1.32*  < > 1.17 1.14 1.13 0.87 1.10  CALCIUM  --   --  8.4*  --   < > 8.9 8.8* 8.9 9.0 8.7*  MG 3.2*  --  2.7* 2.3  --   --   --   --   --   --   < > = values in this interval not displayed. Estimated Creatinine Clearance: 60.2 mL/min (by C-G formula  based on Cr of 1.1).  LIVER  Recent Labs Lab 10/23/14 1440 10/30/14 0225  AST  --  15  ALT  --  22  ALKPHOS  --  66  BILITOT  --  0.6  PROT  --  4.8*  ALBUMIN  --  2.0*  INR 1.23  --    INFECTIOUS  Recent Labs Lab 10/29/14 1202 10/30/14 0225  PROCALCITON <0.10 <0.10    Imaging  7/27 CXR personally reviewed: there is atelectasis R>L, perhaps RLL effusion, no clear airspace disease   ASSESSMENT / PLAN:  PULMONARY ETT 7/08 >> 7/11, 7/20 >> 7/20 (CABG) A: Acute respiratory failure with hypoxemia 7/26 > suspect combination of mucus plugging and atelectasis, better post draining effusion Bilateral effusions> R on 7/13 was a transudate related to heart failure; L on 7/26 was bloody, so probably inflammatory/post op related COPD> contributing to mucus plugging P:   Aggressive pulmonary hygiene:  PT, push IS/Flutter valve Oxygen as needed for saturations 90-95% Minimize sedation Change bronchodilators to brovana/pulmicort, monitor for BPH symptoms (Advair supposedly made this worse) D/c mucomyst Get OUT OF BED, into chair Will assess R chest with ultrasound today for possible thoracentesis Chest PT via bed Q4 while awake See ID Smoking cessation counseling   CARDIOVASCULAR A: 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

## 2014-10-30 NOTE — Clinical Social Work Note (Signed)
CSW attempted to meet with patient to discuss SNF options and to explain process of SNF placement, patient was sleeping and did not want to be disturbed will try again tomorrow.  CSW still continuing to follow patient throughout discharge planning.  Jones Broom. [place], MSW, [place] 10/30/2014 2:44 PM

## 2014-10-30 NOTE — Progress Notes (Signed)
7 Days Post-Op Procedure(s) (LRB):   CORONARY ARTERY BYPASS GRAFTING (CABG)X3 LIMA-LAD;SVG to OM,SVG to Distal RCA (N/A) TRANSESOPHAGEAL ECHOCARDIOGRAM (TEE) (N/A) Subjective: Feels better this AM Still has a lot of incisional pain. Breathing easier  Objective: Vital signs in last 24 hours: Temp:  [97.9 F (36.6 C)-98.1 F (36.7 C)] 98 F (36.7 C) (07/27 0345) Pulse Rate:  [40-101] 96 (07/27 0600) Cardiac Rhythm:  [-] Normal sinus rhythm (07/27 0400) Resp:  [13-27] 13 (07/27 0600) BP: (94-167)/(55-86) 142/71 mmHg (07/27 0600) SpO2:  [85 %-100 %] 100 % (07/27 0600) Weight:  [136 lb 7.4 oz (61.9 kg)] 136 lb 7.4 oz (61.9 kg) (07/27 0500)  Hemodynamic parameters for last 24 hours:    Intake/Output from previous day: 07/26 0701 - 07/27 0700 In: 550 [P.O.:300; IV Piggyback:250] Out: 1945 [Urine:1945] Intake/Output this shift:    General appearance: alert, cooperative and no distress Neurologic: generally weak, no focal deficit Heart: regular rate and rhythm Lungs: diminished breath sounds bibasilar and R>L Abdomen: normal findings: soft, non-tender Wound: clean and dry  Lab Results:  Recent Labs  10/29/14 0252 10/30/14 0225  WBC 12.6* 12.8*  HGB 9.8* 9.2*  HCT 30.9* 29.1*  PLT 375 425*   BMET:  Recent Labs  10/29/14 0252 10/30/14 0225  NA 136 133*  K 5.0 4.8  CL 94* 92*  CO2 36* 37*  GLUCOSE 125* 155*  BUN 19 21*  CREATININE 0.87 1.10  CALCIUM 9.0 8.7*    PT/INR: No results for input(s): LABPROT, INR in the last 72 hours. ABG    Component Value Date/Time   PHART 7.342* 10/24/2014 0050   HCO3 23.4 10/24/2014 0050   TCO2 26 10/24/2014 2126   ACIDBASEDEF 2.0 10/24/2014 0050   O2SAT 59.3 10/26/2014 1050   CBG (last 3)   Recent Labs  10/29/14 1121 10/29/14 1536 10/29/14 2100  GLUCAP 105* 108* 104*    Assessment/Plan: S/P Procedure(s) (LRB):   CORONARY ARTERY BYPASS GRAFTING (CABG)X3 LIMA-LAD;SVG to OM,SVG to Distal RCA (N/A) TRANSESOPHAGEAL  ECHOCARDIOGRAM (TEE) (N/A) -  CV- stable rhythm  BP better with current regimen  Acute on chronic systolic heart failure- BNP elevated- diuresis with IV lasix  RESP- respiratory status improved after draining left effusion yesterday  CCM to check right side with [place] today  DAY 2 Zosyn for presumed pneumonia  Continue nebs, IS, mucomyst  RENAL- creatinine stable, IV diuresis  Urinary retention- keep Foley in place for now  ENDO- CBG well controlled  Deconditioning- severe, continue to mobilize/ PT  DVT prophylaxis- enoxaparin   LOS: 18 days    Melrose Nakayama 10/30/2014

## 2014-10-30 NOTE — Evaluation (Signed)
Occupational Therapy Re-Evaluation Patient Details Name: Travis Romero MRN: [id number] DOB: 10-14-1950 Today's Date: 10/30/2014    History of Present Illness Pt was admitted with progressive dyspnea and dx'd with CHF exacerbation.  He was intubated 7/8 - 7/11.  Pt has a h/o HTN, COPD, and back pain. Pt with AMS since extubation. 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    Clinical Impression   Pt was independent prior to admission.  Presents with impaired cognition, decreased balance and poor activity tolerance interfering with ability to perform ADL and ADL transfers.  Pt with new onset of L hand/wrist weakness and edema since initial evaluation.  RN made aware.  Will follow. Recommend post acute rehab in SNF.   Follow Up Recommendations  SNF;Supervision/Assistance - 24 hour    Equipment Recommendations       Recommendations for Other Services       Precautions / Restrictions Precautions Precautions: Fall;Sternal Precaution Comments: watch sats Restrictions Weight Bearing Restrictions: Yes (sternal precautions)      Mobility Bed Mobility Overal bed mobility: Needs Assistance;+2 for physical assistance Bed Mobility: Supine to Sit;Sit to Supine     Supine to sit: Mod assist;+2 for safety/equipment;HOB elevated Sit to supine: +2 for physical assistance;Max assist      Transfers                      Balance     Sitting balance-Leahy Scale: Fair                                      ADL Overall ADL's : Needs assistance/impaired Eating/Feeding: Set up;Bed level   Grooming: Wash/dry hands;Wash/dry face;Set up;Bed level   Upper Body Bathing: Moderate assistance;Sitting   Lower Body Bathing: Total assistance;Bed level   Upper Body Dressing : Minimal assistance;Sitting   Lower Body Dressing: Total assistance;Bed level                 General ADL Comments: decreased thoroughness and task persistence     Vision     Perception      Praxis      Pertinent Vitals/Pain Pain Assessment: Faces Faces Pain Scale: Hurts little more Pain Location: chest incision Pain Descriptors / Indicators: Operative site guarding;Grimacing Pain Intervention(s): Monitored during session (RN aware)     Hand Dominance Right   Extremity/Trunk Assessment Upper Extremity Assessment Upper Extremity Assessment: LUE deficits/detail LUE Deficits / Details: mild edema in hand with 4-/5 gross grasp, 4/5 wrist extension and inability to extend 3rd to 5th fingers fully LUE Coordination: decreased fine motor;decreased gross motor   Lower Extremity Assessment Lower Extremity Assessment: Defer to PT evaluation       Communication Communication Communication: HOH   Cognition Arousal/Alertness: Awake/alert Behavior During Therapy: Anxious Overall Cognitive Status: No family/caregiver present to determine baseline cognitive functioning Area of Impairment: Following commands;Problem solving;Attention;Memory;Awareness   Current Attention Level: Sustained Memory: Decreased short-term memory Following Commands: Follows one step commands with increased time Safety/Judgement: Decreased awareness of safety;Decreased awareness of deficits Awareness: Intellectual Problem Solving: Slow processing;Difficulty sequencing;Requires verbal cues;Requires tactile cues General Comments: unable to state sternal precautions   General Comments       Exercises       Shoulder Instructions      Home Living Family/patient expects to be discharged to:: Skilled nursing facility Living Arrangements: Alone  Prior Functioning/Environment Level of Independence: Independent        Comments: was working    OT Diagnosis: Generalized weakness;Cognitive deficits;Acute pain   OT Problem List: Decreased strength;Decreased activity tolerance;Impaired balance (sitting and/or standing);Decreased  cognition;Cardiopulmonary status limiting activity;Pain;Increased edema;Decreased coordination;Decreased knowledge of use of DME or AE;Decreased knowledge of precautions;Impaired UE functional use   OT Treatment/Interventions: Self-care/ADL training;DME and/or AE instruction;Balance training;Patient/family education;Therapeutic activities;Cognitive remediation/compensation    OT Goals(Current goals can be found in the care plan section) Acute Rehab OT Goals OT Goal Formulation: With patient Time For Goal Achievement: 11/13/14 Potential to Achieve Goals: Good  OT Frequency: Min 2X/week   Barriers to D/C: Decreased caregiver support          Co-evaluation              End of Session Equipment Utilized During Treatment: Oxygen Nurse Communication: Other (comment) (aware of change in function of L UE)  Activity Tolerance: Patient limited by fatigue Patient left: in bed;with call bell/phone within reach   Time: 1440-1500 OT Time Calculation (min): 20 min Charges:  OT General Charges $OT Visit: 1 Procedure OT Evaluation $OT Re-eval: 1 Procedure G-Codes:    [name] 10/30/2014, 3:46 PM  [phone]

## 2014-10-31 ENCOUNTER — Inpatient Hospital Stay (HOSPITAL_COMMUNITY): Payer: PRIVATE HEALTH INSURANCE

## 2014-10-31 DIAGNOSIS — R5381 Other malaise: Secondary | ICD-10-CM

## 2014-10-31 DIAGNOSIS — J432 Centrilobular emphysema: Secondary | ICD-10-CM

## 2014-10-31 LAB — BASIC METABOLIC PANEL
Anion gap: 8 (ref 5–15)
BUN: 16 mg/dL (ref 6–20)
CALCIUM: 8.6 mg/dL — AB (ref 8.9–10.3)
CO2: 38 mmol/L — ABNORMAL HIGH (ref 22–32)
Chloride: 91 mmol/L — ABNORMAL LOW (ref 101–111)
Creatinine, Ser: 1.06 mg/dL (ref 0.61–1.24)
GFR calc Af Amer: >60 mL/min (ref >60)
GFR calc non Af Amer: >60 mL/min (ref >60)
Glucose, Bld: 95 mg/dL (ref 65–99)
POTASSIUM: 3.7 mmol/L (ref 3.5–5.1)
SODIUM: 137 mmol/L (ref 135–145)

## 2014-10-31 LAB — GLUCOSE, CAPILLARY
GLUCOSE-CAPILLARY: 104 mg/dL — AB (ref 65–99)
GLUCOSE-CAPILLARY: 125 mg/dL — AB (ref 65–99)
Glucose-Capillary: 149 mg/dL — ABNORMAL HIGH (ref 65–99)
Glucose-Capillary: 131 mg/dL — ABNORMAL HIGH (ref 65–99)

## 2014-10-31 LAB — CBC
HEMATOCRIT: 28.4 % — AB (ref 39.0–52.0)
Hemoglobin: 9.1 g/dL — ABNORMAL LOW (ref 13.0–17.0)
MCH: 35.7 pg — AB (ref 26.0–34.0)
MCHC: 32 g/dL (ref 30.0–36.0)
MCV: 111.4 fL — ABNORMAL HIGH (ref 78.0–100.0)
PLATELETS: 470 10*3/uL — AB (ref 150–400)
RBC: 2.55 MIL/uL — ABNORMAL LOW (ref 4.22–5.81)
RDW: 14.5 % (ref 11.5–15.5)
WBC: 12.2 10*3/uL — AB (ref 4.0–10.5)

## 2014-10-31 LAB — PROCALCITONIN

## 2014-10-31 LAB — RHEUMATOID FACTORS, FLUID: Rheumatoid Arthritis, Qn/Fluid: NEGATIVE

## 2014-10-31 MED ORDER — CARVEDILOL 25 MG PO TABS
25.0000 mg | ORAL_TABLET | Freq: Two times a day (BID) | ORAL | Status: DC
  Administered 2014-10-31 – 2014-11-01 (×2): 25 mg via ORAL
  Filled 2014-10-31 (×4): qty 1

## 2014-10-31 MED ORDER — POTASSIUM CHLORIDE CRYS ER 20 MEQ PO TBCR
20.0000 meq | EXTENDED_RELEASE_TABLET | ORAL | Status: AC | PRN
  Administered 2014-10-31 (×3): 20 meq via ORAL
  Filled 2014-10-31 (×3): qty 1

## 2014-10-31 MED ORDER — FUROSEMIDE 40 MG PO TABS
40.0000 mg | ORAL_TABLET | Freq: Two times a day (BID) | ORAL | Status: DC
  Administered 2014-10-31 – 2014-11-04 (×8): 40 mg via ORAL
  Filled 2014-10-31 (×9): qty 1

## 2014-10-31 NOTE — Progress Notes (Signed)
8 Days Post-Op Procedure(s) (LRB):   CORONARY ARTERY BYPASS GRAFTING (CABG)X3 LIMA-LAD;SVG to OM,SVG to Distal RCA (N/A) TRANSESOPHAGEAL ECHOCARDIOGRAM (TEE) (N/A) Subjective: Feels better today Less short of breath BM after enema and disimpaction yesterday OT noted issues with left hand weakness, pronation, suppination yesterday- he says has been present since admission  Objective: Vital signs in last 24 hours: Temp:  [97.3 F (36.3 C)-98.6 F (37 C)] 98.1 F (36.7 C) (07/28 0400) Pulse Rate:  [88-109] 95 (07/28 0800) Cardiac Rhythm:  [-] Normal sinus rhythm (07/28 0800) Resp:  [15-30] 19 (07/28 0800) BP: (102-155)/(55-108) 148/71 mmHg (07/28 0800) SpO2:  [91 %-100 %] 94 % (07/28 0800) Weight:  [130 lb 1.1 oz (59 kg)] 130 lb 1.1 oz (59 kg) (07/28 0500)  Hemodynamic parameters for last 24 hours:    Intake/Output from previous day: 07/27 0701 - 07/28 0700 In: 530 [P.O.:180; I.V.:350] Out: 2355 [Urine:2355] Intake/Output this shift: Total I/O In: 10 [I.V.:10] Out: 60 [Urine:60]  General appearance: alert, cooperative and no distress Neurologic: generally weak, weakness Left 4th and 5th fingers Heart: regular rate and rhythm Lungs: diminished breath sounds bilaterally Wound: minimal bloody drainage, no erythema  Lab Results:  Recent Labs  10/30/14 0225 10/31/14 0231  WBC 12.8* 12.2*  HGB 9.2* 9.1*  HCT 29.1* 28.4*  PLT 425* 470*   BMET:  Recent Labs  10/30/14 0225 10/31/14 0231  NA 133* 137  K 4.8 3.7  CL 92* 91*  CO2 37* 38*  GLUCOSE 155* 95  BUN 21* 16  CREATININE 1.10 1.06  CALCIUM 8.7* 8.6*    PT/INR: No results for input(s): LABPROT, INR in the last 72 hours. ABG    Component Value Date/Time   PHART 7.342* 10/24/2014 0050   HCO3 23.4 10/24/2014 0050   TCO2 26 10/24/2014 2126   ACIDBASEDEF 2.0 10/24/2014 0050   O2SAT 59.3 10/26/2014 1050   CBG (last 3)   Recent Labs  10/30/14 1241 10/30/14 1712 10/30/14 2153  GLUCAP 126* 137* 167*     Assessment/Plan: S/P Procedure(s) (LRB):   CORONARY ARTERY BYPASS GRAFTING (CABG)X3 LIMA-LAD;SVG to OM,SVG to Distal RCA (N/A) TRANSESOPHAGEAL ECHOCARDIOGRAM (TEE) (N/A) -  CV- maintaining SR, BP still high- increase coreg to 25 BID  Will ask heart failure team to see  RESP- improved after bilateral thoracentesis  Still has left lower lobe atelectasis  Day 3 zosyn-   RENAL/ GU- creatinine stable, will change to PO lasix beginning this evening  Keep Foley due to urinary retention  GI- tolerating POs  Anemia secondary to ABL- mild, follow  Deconditioning/ balance issues- continue PT/OT, will ask Rehab medicine to see him re: possible inpatient rehab candidate, if not will need SNF   LOS: 19 days    Melrose Nakayama 10/31/2014

## 2014-10-31 NOTE — Progress Notes (Signed)
PULMONARY / CRITICAL CARE MEDICINE  Name: Travis Romero MRN: [id number] DOB: 10-15-1950    ADMISSION DATE:  10/11/2014  PRIMARY SERVICE: PCCM  CHIEF COMPLAINT:  Worsening PNA  BRIEF PATIENT DESCRIPTION:  64 y/o male smoker with hx of refractory HTN, COPD, chronic pain, & ETOH abuse admitted 7/8 with progressive dyspnea from pulmonary edema and combined acute on chronic heart failure. Underwent L/R heart cath with 3 vessel disease.  Course complicated by AKI with aggressive diuresis.    SIGNIFICANT EVENTS:  7/08  Admit to [facility] for progressive SOB 7/10  Cardiology consulted, aggressive diuresis  7/11  Extubated 7/12  Transferred to [facility] for pending cardiac cath 7/13  R Thoracentesis > 850 ml clear fluid, neg culture 7/14  R & L heart cath >> multivessel (3) CAD  7/16  Negative 16L since admit 7/18  Sr cr rising with diuresis, lasix held 7/20  CABG x3 per Dr. Roxan Hockey, TEE with impared LV fxn, EF 30%, trace MR.  Extubated.  7/22  Ongoing atelectasis, effusions on CXR, BP elevated with tachycardia 7/24  Hypertensive, volume overload/ongoing diuresis, deconditioned 7/26  Developed worsening dyspnea, NRB. Diminished breath sounds, hypertensive; L thoracentesis performed with 1.5 L removed, described as "dark bloody fluid" by radiology 7/27 R thoracentesis > 1.2 L removed  STUDIES: 7/09 Echo >> severe LVH, EF 20 to 30%, grade 1 diastolic dysfx, PAS 35 mmHg    SUBJECTIVE: Thora again today   VITAL SIGNS: Temp:  [97.3 F (36.3 C)-98.6 F (37 C)] 98 F (36.7 C) (07/28 0900) Pulse Rate:  [88-100] 95 (07/28 0800) Resp:  [15-30] 19 (07/28 0800) BP: (102-155)/(55-108) 148/71 mmHg (07/28 0800) SpO2:  [91 %-100 %] 94 % (07/28 0800) Weight:  [130 lb 1.1 oz (59 kg)] 130 lb 1.1 oz (59 kg) (07/28 0500)   VENTILATOR SETTINGS:     INTAKE / OUTPUT: Intake/Output      07/27 0701 - 07/28 0700 07/28 0701 - 07/29 0700   P.O. 600    I.V. (mL/kg) 350 (5.9) 10 (0.2)   IV Piggyback      Total Intake(mL/kg) 950 (16.1) 10 (0.2)   Urine (mL/kg/hr) 2355 (1.7) 60 (0.4)   Stool 0 (0)    Total Output 2355 60   Net -1405 -50        Stool Occurrence 3 x      PHYSICAL EXAMINATION:  Gen: chronically ill appearing HENT: OP clear, TM's clear, neck supple PULM: CTA B today, normal effort CV: RRR, midline scar well healed, no mgr, trace edema GI: BS+, soft, nontender Derm: no cyanosis or rash Psyche: normal mood and affect   LABS:  PULMONARY  Recent Labs Lab 10/24/14 1628 10/24/14 2126 10/26/14 1050  TCO2 25 26  --   O2SAT  --   --  59.3   CBC  Recent Labs Lab 10/29/14 0252 10/30/14 0225 10/31/14 0231  HGB 9.8* 9.2* 9.1*  HCT 30.9* 29.1* 28.4*  WBC 12.6* 12.8* 12.2*  PLT 375 425* 470*   COAGULATION No results for input(s): INR in the last 168 hours. CARDIAC   No results for input(s): TROPONINI in the last 168 hours. No results for input(s): PROBNP in the last 168 hours.  CHEMISTRY  Recent Labs Lab 10/24/14 1625  10/27/14 0355 10/28/14 0208 10/29/14 0252 10/30/14 0225 10/31/14 0231  NA  --   < > 134* 136 136 133* 137  K  --   < > 4.7 4.4 5.0 4.8 3.7  CL  --   < >  95* 95* 94* 92* 91*  CO2  --   < > 31 33* 36* 37* 38*  GLUCOSE  --   < > 118* 149* 125* 155* 95  BUN  --   < > 21* 21* 19 21* 16  CREATININE 1.32*  < > 1.14 1.13 0.87 1.10 1.06  CALCIUM  --   < > 8.8* 8.9 9.0 8.7* 8.6*  MG 2.3  --   --   --   --   --   --   < > = values in this interval not displayed. Estimated Creatinine Clearance: 59.5 mL/min (by C-G formula based on Cr of 1.06).  LIVER  Recent Labs Lab 10/30/14 0225 10/30/14 1335  AST 15  --   ALT 22  --   ALKPHOS 66  --   BILITOT 0.6  --   PROT 4.8* 5.3*  ALBUMIN 2.0*  --    INFECTIOUS  Recent Labs Lab 10/29/14 1202 10/30/14 0225 10/31/14 0231  PROCALCITON <0.10 <0.10 <0.10    Imaging  7/28 CXR personally reviewed: small left effusion, R atelectasis and effusion improved   ASSESSMENT /  PLAN:  PULMONARY ETT 7/08 >> 7/11, 7/20 >> 7/20 (CABG) A/P: Acute respiratory failure with hypoxemia 7/26 > suspect combination of mucus plugging and atelectasis, greatly improved -continue flutter, incentive spirometry -d/c chest pt -out of bed as able -agree with rehab  Bilateral effusions> R effusion tapped 7/28 looks exudative, but I suspect it is related to both heart failure and mild inflammation post op.  Fluid analysis not consistent with infection.   -Will follow up culture and cytology.   -keep him as dry as possible -repeat CXR PRN in hospital and on hospital f/u in pulmonary clinic  COPD> contributing to mucus plugging, not in exacerbation, better with current inhaled regimen History of BPH, avoid Spiriva -in hospital maintain on Brovana/Pulmicort + prn xopenex  -at hospital discharge arrange f/u with pulmonary and change inhaled therapy to Symbicort 80/4.5 2 puffs bid + prn xopenex  PCCM will sign off, call if questions    Roselie Awkward, MD [place] PCCM Pager: [phone] Cell: [phone] After 3pm or if no response, call [phone]

## 2014-10-31 NOTE — Clinical Social Work Note (Addendum)
CSW spoke with patient and his sister about SNF offers.  CSW informed patient that according to his insurance company there is only one facility that is in network which is [facility] in [place].  CSW contacted [facility] and left message on admissions voice mail to see if they would be able to take patient if he does not go to inpatient rehab.  CSW awaiting call back from [facility].  CSW also contacted [facility], who said they need to contact insurance company to see if they would approve placement for patient in a SNF, awaiting response back from [employer] and [facility].  Jones Broom. [place], MSW, [place] 10/31/2014 4:45 PM

## 2014-10-31 NOTE — Plan of Care (Signed)
Problem: Phase I Progression Outcomes Goal: Voiding-avoid urinary catheter unless indicated Outcome: Not Met (add Reason) Foley remains  Problem: Phase II Progression Outcomes Goal: Progress activities as ordered Outcome: Not Met (add Reason) High fall risk-not ambulating yet.  OOB to chair only.  Problem: Phase III Progression Outcomes Goal: Activity advanced as tolerated Outcome: Not Met (add Reason) OOB to chair only.  Unable to ambulate

## 2014-10-31 NOTE — Progress Notes (Signed)
Rehab admissions - Evaluated for possible admission.  I met with patient.  He referred me back to his sister to discuss discharge plans.  I called his Sister, Lyda Jester and left a message.  Patient tells me that he prefers to come to rehab here at the hospital and not to go out to a SNF for rehab.  Call me for questions.  #[phone]

## 2014-10-31 NOTE — Progress Notes (Signed)
Patient ID: Travis Romero, male   DOB: 1950/04/10, 64 y.o.   MRN: [id number]  SICU Evening Rounds:  Hemodynamically stable  Urine output ok  Ambulated a short distance today. Very slow progress.

## 2014-10-31 NOTE — Progress Notes (Signed)
Physical Therapy Treatment Patient Details Name: Travis Romero MRN: [id number] DOB: 1950-08-07 Today's Date: 10/31/2014    History of Present Illness Pt was admitted with progressive dyspnea and dx'd with CHF exacerbation.  He was intubated 7/8 - 7/11.  Pt has a h/o HTN, COPD, and back pain. Pt with AMS since extubation. 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     PT Comments    Retropulsion continues to be an issue safely mobilizing pt. Nursing reports successfully used stedy lift for transfers. PT able to successfully transfer sit to stand and walk 5 ft forward with pt "pushing" therapist away from him (PT facing pt, his hands lightly on PT's shoulders, PT walking backwards as he "push(ed) me away." Respiratory status better today, however continues to desaturate to 88% on 5L with RR up to 34. Able to recover to RR24 and SaO2 94% in less than 2 minutes   Follow Up Recommendations  CIR     Equipment Recommendations   (TBD)    Recommendations for Other Services OT consult;Rehab consult     Precautions / Restrictions Precautions Precautions: Fall;Sternal Restrictions Weight Bearing Restrictions: No    Mobility  Bed Mobility Overal bed mobility: Needs Assistance;+2 for physical assistance Bed Mobility: Rolling;Sidelying to Sit Rolling: Min assist Sidelying to sit: Min assist       General bed mobility comments: HOB flat, no rail; step by step cues for sequencing and maintaining precautions  Transfers Overall transfer level: Needs assistance Equipment used: Rolling walker (2 wheeled);None Transfers: Sit to/from Stand Sit to Stand: +2 physical assistance;+2 safety/equipment;Min assist;Max assist         General transfer comment: with RW and use of gait belt, as begins to extend hips and knees immediately begins retropulsion and does not sense he is leaning/pushing backwards; removed RW and stood in front of pt, placed his hands on PTs shoulders and cued him to "push me away"  repeated cue as came to standing and as he walked forward (pt with very light pressure through his hands on PTs shoulders)  Ambulation/Gait Ambulation/Gait assistance: Min assist;+2 physical assistance;+2 safety/equipment Ambulation Distance (Feet): 5 Feet Assistive device: None Gait Pattern/deviations: Step-through pattern;Decreased stride length;Wide base of support;Trunk flexed Gait velocity: very slow   General Gait Details: retropulsion minimized when PT in front of pt, his hands on PTs shoulders with constant cues to "push me away" as pt walked forward and PT walked backward; light pressure through pt's hands; chair brought behind pt to sit   Stairs            Wheelchair Mobility    Modified Rankin (Stroke Patients Only)       Balance     Sitting balance-Leahy Scale: Fair Sitting balance - Comments: feet unsupported initially and mod assist to maintain balance (posterior lean)   Standing balance support: Bilateral upper extremity supported Standing balance-Leahy Scale: Zero                      Cognition Arousal/Alertness: Awake/alert Behavior During Therapy: Flat affect Overall Cognitive Status: No family/caregiver present to determine baseline cognitive functioning Area of Impairment: Following commands;Problem solving;Attention;Memory;Awareness   Current Attention Level: Sustained Memory: Decreased short-term memory;Decreased recall of precautions Following Commands: Follows one step commands with increased time (short instructions better) Safety/Judgement: Decreased awareness of safety;Decreased awareness of deficits Awareness: Intellectual Problem Solving: Slow processing;Difficulty sequencing;Requires verbal cues;Requires tactile cues General Comments: able to state sternal precautions; slow processing; cannot follow multi-step or complex instructions  Exercises General Exercises - Lower Extremity Ankle Circles/Pumps: Both;10 reps;AAROM (very  sleepy) Other Exercises Other Exercises: seated rest with practice forward flexing at hips to reach to his socks; pt did with no difficulty or retropulsion    General Comments        Pertinent Vitals/Pain Pain Assessment: Faces Faces Pain Scale: Hurts little more Pain Location: chest Pain Descriptors / Indicators: Operative site guarding Pain Intervention(s): Limited activity within patient's tolerance;Monitored during session;Repositioned    Home Living                      Prior Function            PT Goals (current goals can now be found in the care plan section) Acute Rehab PT Goals Patient Stated Goal: none stated; agreeable to PT PT Goal Formulation: With patient Time For Goal Achievement: 11/02/14 Potential to Achieve Goals: Good Progress towards PT goals: Progressing toward goals    Frequency  Min 3X/week    PT Plan Current plan remains appropriate    Co-evaluation             End of Session Equipment Utilized During Treatment: Gait belt;Oxygen Activity Tolerance: Patient limited by fatigue;Other (comment) (and retropulsion) Patient left: with call bell/phone within reach;in chair     Time: 1202-1224 PT Time Calculation (min) (ACUTE ONLY): 22 min  Charges:  $Therapeutic Activity: 8-22 mins                    G Codes:      Emanie Behan 11/05/14, 12:44 PM  Pager [phone]

## 2014-10-31 NOTE — Progress Notes (Signed)
Pt refusing BiPAP at this time, pt resting well on 3L nasal cannula. Not SOB or in distress. RT informed pt to call for RT if changes his mind during the night.

## 2014-10-31 NOTE — Consult Note (Signed)
Physical Medicine and Rehabilitation Consult  Reason for Consult: Deconditioning due to acute respiratory failure, CHF, and recent CABG.  Referring Physician: Dr. Roxan Hockey.    HPI: Travis Romero is a 65 y.o. male with history of HTN, COPD, tobacco abuse who was admitted on 10/11/14 with progressive SOB due to acute on chronic respiratory failure and was intubation at admission. He was found to have acute systolic CHF requiring aggressive diuresis and 2 D echo done revealing EF 20-25% with mild MR. 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  Post op extubated without difficulty and volume excess treated with diuresis. Foley placed due to urinary retention and he was started on IV antibiotics due ongoing hypoxia and  concerns of COPD exacerbation/presumed PNA.   He underwent thoracocentesis of 1.5 L of dark blood fluid on 07/26 and PCCM consulted for input.  Aggressive pulmonary toilet, increase in activity as well as chest PT recommended. He underwent right thoracocentesis on 07/27 with improvement in respiratory status.  Patient has had issues with confusion, processing deficits, poor safety awareness as well as left hand/wrist weakness. Therapy ongoing and CIR recommended due to severe deconditioning.    Review of Systems  Constitutional: Negative for fever.  Eyes: Negative for blurred vision.  Respiratory: Positive for cough and shortness of breath.   Cardiovascular: Positive for palpitations.  Gastrointestinal: Negative for heartburn.  Genitourinary: Negative for dysuria.  Musculoskeletal: Negative for myalgias.  Skin: Negative for rash.  Neurological: Positive for focal weakness. Negative for headaches.  Endo/Heme/Allergies: Negative for polydipsia.      Past Medical History  Diagnosis Date  . Hypertension   . COPD (chronic obstructive pulmonary disease)   . BPH (benign  prostatic hyperplasia)   . Back pain 2007    after fall down steps    Past Surgical History  Procedure Laterality Date  . Tonsillectomy    . Pyloromyotomy      pyloric stenosis  . Cardiac catheterization N/A 10/17/2014    Procedure: Right/Left Heart Cath and Coronary Angiography;  Surgeon: Troy Sine, MD;  Location: [facility];  Service: Cardiovascular;  Laterality: N/A;  . Coronary artery bypass graft N/A 10/23/2014    Procedure:   CORONARY ARTERY BYPASS GRAFTING (CABG)X3 LIMA-LAD;SVG to OM,SVG to Distal RCA;  Surgeon: Melrose Nakayama, MD;  Location: [facility];  Service: Open Heart Surgery;  Laterality: N/A;  . Tee without cardioversion N/A 10/23/2014    Procedure: TRANSESOPHAGEAL ECHOCARDIOGRAM (TEE);  Surgeon: Melrose Nakayama, MD;  Location: [facility];  Service: Open Heart Surgery;  Laterality: N/A;    Family History  Problem Relation Age of Onset  . Hypertension Mother   . Heart disease Father     Social History:  Lives alone and independent PTA.  reports that he has been smoking Cigarettes.  He has smoked for the past 20 years. He has never used smokeless tobacco. He reports that he drinks about 1.2 - 1.8 oz of alcohol per week. He reports that he does not use illicit drugs.     Allergies  Allergen Reactions  . Advair Diskus [Fluticasone-Salmeterol] Other (See Comments)    Exacerbated BPH   . Flexeril [Cyclobenzaprine] Other (See Comments)    Caused shortness of breath and swelling in scrotum     Medications Prior to Admission  Medication Sig Dispense Refill  . ALPRAZolam (XANAX) 0.5 MG tablet TAKE 1  TABLET BY MOUTH 3 TIMES A DAY AS NEEDED FOR 90 DAYS  0  . benzonatate (TESSALON) 100 MG capsule Take 100-200 mg by mouth 3 (three) times daily as needed for cough.    . cloNIDine (CATAPRES) 0.2 MG tablet Take 0.2 mg by mouth 4 (four) times daily as needed.     . dutasteride (AVODART) 0.5 MG capsule Take 0.5 mg by mouth daily.  0  . Ipratropium-Albuterol  (COMBIVENT IN) Inhale into the lungs.    . isradipine (DYNACIRC) 5 MG capsule Take 10 mg by mouth 2 (two) times daily.  1  . mirtazapine (REMERON) 45 MG tablet Take 45 mg by mouth at bedtime.    Marland Kitchen spironolactone (ALDACTONE) 50 MG tablet Take 75 mg by mouth daily.    Marland Kitchen telmisartan (MICARDIS) 80 MG tablet Take 80 mg by mouth daily.  1  . traMADol (ULTRAM) 50 MG tablet Take 1 tablet (50 mg total) by mouth every 8 (eight) hours as needed. 30 tablet 0  . cyclobenzaprine (FLEXERIL) 10 MG tablet Take 1 tablet (10 mg total) by mouth 3 (three) times daily as needed for muscle spasms. (Patient not taking: Reported on 10/11/2014) 30 tablet 0    Home: Home Living Family/patient expects to be discharged to:: Skilled nursing facility Living Arrangements: Alone  Functional History: Prior Function Level of Independence: Independent Comments: was working Functional Status:  Mobility: Bed Mobility Overal bed mobility: Needs Assistance, +2 for physical assistance Bed Mobility: Supine to Sit, Sit to Supine Rolling: Total assist, +2 for physical assistance Supine to sit: Mod assist, +2 for safety/equipment, HOB elevated Sit to supine: +2 for physical assistance, Max assist General bed mobility comments: HOB elevated on arrival; due to respiratory status, chose not to lie flat and pivoted to EOB from supine;  Transfers Overall transfer level: Needs assistance Equipment used: Rolling walker (2 wheeled) Transfers: Sit to/from Stand, Stand Pivot Transfers Sit to Stand: +2 physical assistance, Mod assist, +2 safety/equipment Stand pivot transfers: +2 physical assistance, Max assist Squat pivot transfers: Max assist General transfer comment: Initiates with max cues, initially min assist for anterior translation, however as begins to extend hips and knees immediately begins retropulsion and does not sense he is leaning/pushing backwards; x 2 Ambulation/Gait Ambulation/Gait assistance: Max assist, +2 physical  assistance, +2 safety/equipment Ambulation Distance (Feet): 2 Feet Assistive device: Rolling walker (2 wheeled) Gait Pattern/deviations: Shuffle (retroopulsion) General Gait Details: retropulsion requires 2 person assist and constant cues to work towards upright/vertical over his BOS    ADL: ADL Overall ADL's : Needs assistance/impaired Eating/Feeding: Set up, Bed level Grooming: Wash/dry hands, Wash/dry face, Set up, Bed level Grooming Details (indicate cue type and reason): decreased thoroughness, needs cues for task persistence Upper Body Bathing: Moderate assistance, Sitting Lower Body Bathing: Total assistance, Bed level Upper Body Dressing : Minimal assistance, Sitting Lower Body Dressing: Total assistance, Bed level General ADL Comments: decreased thoroughness and task persistence  Cognition: Cognition Overall Cognitive Status: No family/caregiver present to determine baseline cognitive functioning Orientation Level: Oriented X4 Cognition Arousal/Alertness: Awake/alert Behavior During Therapy: Anxious Overall Cognitive Status: No family/caregiver present to determine baseline cognitive functioning Area of Impairment: Following commands, Problem solving, Attention, Memory, Awareness Current Attention Level: Sustained Memory: Decreased short-term memory Following Commands: Follows one step commands with increased time Safety/Judgement: Decreased awareness of safety, Decreased awareness of deficits Awareness: Intellectual Problem Solving: Slow processing, Difficulty sequencing, Requires verbal cues, Requires tactile cues General Comments: unable to state sternal precautions   Blood pressure 148/71, pulse  95, temperature 98.1 F (36.7 C), temperature source Oral, resp. rate 19, height 5\' 7"  (1.702 m), weight 59 kg (130 lb 1.1 oz), SpO2 94 %. Physical Exam  Constitutional:  Fatigued appearing  HENT:  Head: Normocephalic and atraumatic.  Right Ear: External ear normal.    Left Ear: External ear normal.  Eyes: Conjunctivae are normal. Pupils are equal, round, and reactive to light.  Neck: No JVD present. No tracheal deviation present. No thyromegaly present.  Cardiovascular:  No murmur heard. tachycardic  Respiratory: No respiratory distress. He has no wheezes. He has no rales.  GI: He exhibits no distension. There is no tenderness.  Musculoskeletal: He exhibits no edema.  Lymphadenopathy:    He has no cervical adenopathy.  Neurological:  UE: 3+ deltoid, 4- bicep, tricep, wrist, hi, LE: 3 hf, 3+ ke and 4- adf/apf  Skin:  Chronic vascular changes either leg  Psychiatric:  Flat but cooperative    Results for orders placed or performed during the hospital encounter of 10/11/14 (from the past 24 hour(s))  Lactate dehydrogenase (CSF, pleural or peritoneal fluid)     Status: Abnormal   Collection Time: 10/30/14 11:30 AM  Result Value Ref Range   LD, Fluid 174 (H) 3 - 23 U/L   Fluid Type-FLDH Pleural R   Protein, pleural or peritoneal fluid     Status: None   Collection Time: 10/30/14 11:30 AM  Result Value Ref Range   Total protein, fluid <3.0 g/dL   Fluid Type-FTP Pleural R   Body fluid cell count with differential     Status: Abnormal   Collection Time: 10/30/14 11:30 AM  Result Value Ref Range   Fluid Type-FCT Pleural R    Color, Fluid YELLOW (A) YELLOW   Appearance, Fluid BLOODY (A) CLEAR   WBC, Fluid 758 0 - 1000 cu mm   Neutrophil Count, Fluid 13 0 - 25 %   Lymphs, Fluid 47 %   Monocyte-Macrophage-Serous Fluid 40 (L) 50 - 90 %   Other Cells, Fluid MESOTHELIAL CELLS %  Body fluid culture     Status: None (Preliminary result)   Collection Time: 10/30/14 11:30 AM  Result Value Ref Range   Specimen Description FLUID PLEURAL RIGHT    Special Requests NONE    Gram Stain      FEW WBC PRESENT, PREDOMINANTLY PMN NO ORGANISMS SEEN    Culture PENDING    Report Status PENDING   Rheumatoid factors, fluid     Status: None   Collection Time:  10/30/14 11:30 AM  Result Value Ref Range   Rheumatoid Arthritis, Qn/Fluid Negative Neg:<1:10  Glucose, capillary     Status: Abnormal   Collection Time: 10/30/14 12:41 PM  Result Value Ref Range   Glucose-Capillary 126 (H) 65 - 99 mg/dL   Comment 1 Capillary Specimen    Comment 2 Notify RN   Lactate dehydrogenase     Status: Abnormal   Collection Time: 10/30/14  1:35 PM  Result Value Ref Range   LDH 212 (H) 98 - 192 U/L  Protein, total     Status: Abnormal   Collection Time: 10/30/14  1:35 PM  Result Value Ref Range   Total Protein 5.3 (L) 6.5 - 8.1 g/dL  Cholesterol, total     Status: None   Collection Time: 10/30/14  1:35 PM  Result Value Ref Range   Cholesterol 101 0 - 200 mg/dL  Glucose, capillary     Status: Abnormal   Collection Time: 10/30/14  5:12  PM  Result Value Ref Range   Glucose-Capillary 137 (H) 65 - 99 mg/dL   Comment 1 Capillary Specimen    Comment 2 Notify RN   Glucose, capillary     Status: Abnormal   Collection Time: 10/30/14  9:53 PM  Result Value Ref Range   Glucose-Capillary 167 (H) 65 - 99 mg/dL  Procalcitonin     Status: None   Collection Time: 10/31/14  2:31 AM  Result Value Ref Range   Procalcitonin <0.10 ng/mL  CBC     Status: Abnormal   Collection Time: 10/31/14  2:31 AM  Result Value Ref Range   WBC 12.2 (H) 4.0 - 10.5 K/uL   RBC 2.55 (L) 4.22 - 5.81 MIL/uL   Hemoglobin 9.1 (L) 13.0 - 17.0 g/dL   HCT 28.4 (L) 39.0 - 52.0 %   MCV 111.4 (H) 78.0 - 100.0 fL   MCH 35.7 (H) 26.0 - 34.0 pg   MCHC 32.0 30.0 - 36.0 g/dL   RDW 14.5 11.5 - 15.5 %   Platelets 470 (H) 150 - 400 K/uL  Basic metabolic panel     Status: Abnormal   Collection Time: 10/31/14  2:31 AM  Result Value Ref Range   Sodium 137 135 - 145 mmol/L   Potassium 3.7 3.5 - 5.1 mmol/L   Chloride 91 (L) 101 - 111 mmol/L   CO2 38 (H) 22 - 32 mmol/L   Glucose, Bld 95 65 - 99 mg/dL   BUN 16 6 - 20 mg/dL   Creatinine, Ser 1.06 0.61 - 1.24 mg/dL   Calcium 8.6 (L) 8.9 - 10.3 mg/dL    GFR calc non Af Amer >60 >60 mL/min   GFR calc Af Amer >60 >60 mL/min   Anion gap 8 5 - 15   Dg Chest 1 View  10/29/2014   CLINICAL DATA:  Thoracentesis.  Evaluate for pneumothorax.  EXAM: CHEST  1 VIEW  COMPARISON:  Prior chest radiographs dating back to 10/12/2014.  FINDINGS: Interval decrease in the LEFT pleural effusion associated with LEFT thoracentesis. RIGHT pleural effusion appears unchanged. Basilar predominant atelectasis.  No pneumothorax is present. Median sternotomy/CABG. Cardiomegaly. Monitoring leads project over the chest.  IMPRESSION: Uncomplicated LEFT thoracentesis with decrease LEFT pleural effusion. Exam otherwise unchanged.   Electronically Signed   By: Dereck Ligas M.D.   On: 10/29/2014 10:32   Dg Chest Port 1 View  10/31/2014   CLINICAL DATA:  Status post coronary bypass grafting  EXAM: PORTABLE CHEST - 1 VIEW  COMPARISON:  10/30/2014  FINDINGS: Cardiac shadow is again mildly enlarged. Postsurgical changes are again seen. Aortic calcifications are noted. No significant right-sided pleural effusion is seen. Emphysematous changes are noted in the right apex. Mild persistent left retrocardiac atelectasis is seen. A small left pleural effusion is noted.  IMPRESSION: Persistent left retrocardiac density.  Small left pleural effusion.   Electronically Signed   By: Inez Catalina M.D.   On: 10/31/2014 07:51   Dg Chest Port 1 View  10/30/2014   CLINICAL DATA:  Pleural effusion.  EXAM: PORTABLE CHEST - 1 VIEW  COMPARISON:  Same day.  FINDINGS: Stable cardiomegaly. Status post coronary artery bypass graft. Emphysematous disease is noted in the right upper lobe. No pneumothorax is noted. Right pleural effusion noted on prior exam is significantly smaller. Mild right basilar subsegmental atelectasis is noted. Stable left basilar opacity is noted concerning for edema or atelectasis with possible associated effusion. Bony thorax is intact.  IMPRESSION: No definite pneumothorax is  noted. Right  pleural effusion is significantly smaller status post thoracentesis. Emphysematous changes noted in right upper lobe. Mild right basilar subsegmental atelectasis is noted. Left basilar opacity is noted concerning for edema or atelectasis with associated pleural effusion.   Electronically Signed   By: Marijo Conception, M.D.   On: 10/30/2014 12:32   Dg Chest Port 1 View  10/30/2014   CLINICAL DATA:  Status post CABG on July 20th  EXAM: PORTABLE CHEST - 1 VIEW  COMPARISON:  Portable chest x-ray of October 29, 2014  FINDINGS: The lungs are slightly less well inflated today. There is persistent bibasilar atelectasis. The left costophrenic angle is excluded from the study. There is no pneumothorax. The cardiac silhouette is mildly enlarged but stable. The pulmonary vascularity is not clearly engorged. The sternal wires are intact. The bony thorax exhibits no acute abnormality.  IMPRESSION: Inflation is decreased today. There is persistent bibasilar atelectasis. No significant re-accumulation of the left pleural effusion is observed today.   Electronically Signed   By: David  Martinique M.D.   On: 10/30/2014 07:39   US Thoracentesis Asp Pleural Space W/img Guide  10/29/2014   CLINICAL DATA:  Left pleural effusion, status post coronary artery bypass graft x 3 vessels  EXAM: ULTRASOUND GUIDED LEFT THORACENTESIS  COMPARISON:  None.  PROCEDURE: An ultrasound guided thoracentesis was thoroughly discussed with the patient and questions answered. The benefits, risks, alternatives and complications were also discussed. The patient understands and wishes to proceed with the procedure. Written consent was obtained.  Ultrasound was performed to localize and mark an adequate pocket of fluid in the left chest. The area was then prepped and draped in the normal sterile fashion. 1% Lidocaine was used for local anesthesia. Under ultrasound guidance a Safe T Centesis catheter was introduced. Thoracentesis was performed. The catheter was  removed and a dressing applied.  COMPLICATIONS: None immediate.  FINDINGS: A total of approximately 1.5 liters of dark bloody fluid was removed. A fluid sample was notsent for laboratory analysis.  IMPRESSION: Successful ultrasound guided left thoracentesis yielding 1.5 liters of pleural fluid.  Read by:  Gareth Eagle, PA-C   Electronically Signed   By: Sandi Mariscal M.D.   On: 10/29/2014 13:20    Assessment/Plan: Diagnosis: debility after respiratory failure, CHF exacerbation 1. Does the need for close, 24 hr/day medical supervision in concert with the patient's rehab needs make it unreasonable for this patient to be served in a less intensive setting? Yes 2. Co-Morbidities requiring supervision/potential complications: hx MI/CAD, pvd 3. Due to bladder management, bowel management, safety, skin/wound care, disease management, medication administration, pain management and patient education, does the patient require 24 hr/day rehab nursing? Yes 4. Does the patient require coordinated care of a physician, rehab nurse, PT (1-2 hrs/day, 5 days/week) and OT (1-2 hrs/day, 5 days/week) to address physical and functional deficits in the context of the above medical diagnosis(es)? Yes Addressing deficits in the following areas: balance, endurance, locomotion, strength, transferring, bowel/bladder control, bathing, dressing, feeding, grooming, toileting and psychosocial support 5. Can the patient actively participate in an intensive therapy program of at least 3 hrs of therapy per day at least 5 days per week? Yes 6. The potential for patient to make measurable gains while on inpatient rehab is excellent 7. Anticipated functional outcomes upon discharge from inpatient rehab are modified independent and supervision  with PT, modified independent and supervision with OT, n/a with SLP. 8. Estimated rehab length of stay to reach the above functional goals  is: 13-18 days 9. Does the patient have adequate social  supports and living environment to accommodate these discharge functional goals? Potentially 10. Anticipated D/C setting: Home 11. Anticipated post D/C treatments: HH therapy and Outpatient therapy 12. Overall Rehab/Functional Prognosis: good  RECOMMENDATIONS: This patient's condition is appropriate for continued rehabilitative care in the following setting: CIR Patient has agreed to participate in recommended program. Yes Note that insurance prior authorization may be required for reimbursement for recommended care.  Comment: Need to follow up on social supports.   Meredith Staggers, MD, [facility] 10/31/2014     10/31/2014

## 2014-11-01 ENCOUNTER — Inpatient Hospital Stay (HOSPITAL_COMMUNITY): Payer: PRIVATE HEALTH INSURANCE

## 2014-11-01 LAB — CBC
HEMATOCRIT: 27.7 % — AB (ref 39.0–52.0)
HEMOGLOBIN: 9.1 g/dL — AB (ref 13.0–17.0)
MCH: 36.4 pg — ABNORMAL HIGH (ref 26.0–34.0)
MCHC: 32.9 g/dL (ref 30.0–36.0)
MCV: 110.8 fL — ABNORMAL HIGH (ref 78.0–100.0)
PLATELETS: 477 10*3/uL — AB (ref 150–400)
RBC: 2.5 MIL/uL — AB (ref 4.22–5.81)
RDW: 14.8 % (ref 11.5–15.5)
WBC: 11 10*3/uL — ABNORMAL HIGH (ref 4.0–10.5)

## 2014-11-01 LAB — GLUCOSE, CAPILLARY
GLUCOSE-CAPILLARY: 109 mg/dL — AB (ref 65–99)
GLUCOSE-CAPILLARY: 130 mg/dL — AB (ref 65–99)
GLUCOSE-CAPILLARY: 147 mg/dL — AB (ref 65–99)
Glucose-Capillary: 138 mg/dL — ABNORMAL HIGH (ref 65–99)

## 2014-11-01 LAB — BASIC METABOLIC PANEL
Anion gap: 6 (ref 5–15)
BUN: 17 mg/dL (ref 6–20)
CO2: 35 mmol/L — ABNORMAL HIGH (ref 22–32)
Calcium: 8.6 mg/dL — ABNORMAL LOW (ref 8.9–10.3)
Chloride: 95 mmol/L — ABNORMAL LOW (ref 101–111)
Creatinine, Ser: 1.22 mg/dL (ref 0.61–1.24)
GFR calc non Af Amer: >60 mL/min (ref >60)
GLUCOSE: 117 mg/dL — AB (ref 65–99)
Potassium: 4.3 mmol/L (ref 3.5–5.1)
Sodium: 136 mmol/L (ref 135–145)

## 2014-11-01 MED ORDER — CHLORHEXIDINE GLUCONATE 0.12 % MT SOLN: 15.0000 mL | Freq: Two times a day (BID) | OROMUCOSAL | Status: DC

## 2014-11-01 MED ORDER — CETYLPYRIDINIUM CHLORIDE 0.05 % MT LIQD: 7.0000 mL | Freq: Four times a day (QID) | OROMUCOSAL | Status: DC

## 2014-11-01 MED ORDER — SPIRONOLACTONE 25 MG PO TABS
25.0000 mg | ORAL_TABLET | Freq: Every day | ORAL | Status: DC
  Administered 2014-11-01 – 2014-11-04 (×4): 25 mg via ORAL
  Filled 2014-11-01 (×4): qty 1

## 2014-11-01 MED ORDER — SODIUM CHLORIDE 0.9 % IJ SOLN: 10.0000 mL | INTRAMUSCULAR | Status: DC | PRN

## 2014-11-01 MED ORDER — CARVEDILOL 6.25 MG PO TABS
6.2500 mg | ORAL_TABLET | Freq: Two times a day (BID) | ORAL | Status: DC
  Administered 2014-11-01 – 2014-11-04 (×6): 6.25 mg via ORAL
  Filled 2014-11-01 (×8): qty 1

## 2014-11-01 MED ORDER — SODIUM CHLORIDE 0.9 % IJ SOLN
10.0000 mL | Freq: Two times a day (BID) | INTRAMUSCULAR | Status: DC
Start: 2014-11-01 — End: 2014-11-01

## 2014-11-01 MED ORDER — SACUBITRIL-VALSARTAN 49-51 MG PO TABS
1.0000 | ORAL_TABLET | Freq: Two times a day (BID) | ORAL | Status: DC
  Administered 2014-11-01 – 2014-11-04 (×7): 1 via ORAL
  Filled 2014-11-01 (×8): qty 1

## 2014-11-01 NOTE — Progress Notes (Signed)
Physical Therapy Treatment Patient Details Name: Travis Romero MRN: [id number] DOB: 1950-11-09 Today's Date: 11/01/2014    History of Present Illness Pt was admitted with progressive dyspnea and dx'd with CHF exacerbation.  He was intubated 7/8 - 7/11.  Pt has a h/o HTN, COPD, and back pain. Pt with AMS since extubation. 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     PT Comments    Pt progressing slowly towards physical therapy goals. Would benefit from building the chair up with pillows in the seat to allow the pt to sit higher. He was not able to power-up to full stand from the low recliner chair, even with +2 total assist. Pt asking to push against therapist's shoulders during standing to assist with anterior lean and balance, however we were not able to make it that far to attempt it this session. Will continue to follow and progress as able per POC.   Follow Up Recommendations  CIR     Equipment Recommendations  Wheelchair (measurements PT)    Recommendations for Other Services OT consult;Rehab consult     Precautions / Restrictions Precautions Precautions: Fall;Sternal Restrictions Weight Bearing Restrictions: Yes (Sternal precautions)    Mobility  Bed Mobility               General bed mobility comments: Pt sitting up in recliner upon PT arrival.   Transfers Overall transfer level: Needs assistance Equipment used: Rolling walker (2 wheeled) Transfers: Sit to/from Stand Sit to Stand: Total assist;+2 physical assistance         General transfer comment: Attempted sit<>stand x2 at edge of chair. Attempted first with one person on each side of him for support, and again with therapist in front of him and tech in the back to assist with hip extension. We were unable to achieve full standing, and pt with increased difficulty extending both hips and knees.   Ambulation/Gait             General Gait Details: Unable to attempt this session as pt with increased difficulty  with standing activity from the low recliner chair.    Stairs            Wheelchair Mobility    Modified Rankin (Stroke Patients Only)       Balance Overall balance assessment: Needs assistance Sitting-balance support: Feet supported;No upper extremity supported Sitting balance-Leahy Scale: Fair     Standing balance support: Bilateral upper extremity supported;During functional activity Standing balance-Leahy Scale: Zero                      Cognition Arousal/Alertness: Awake/alert Behavior During Therapy: Flat affect Overall Cognitive Status: No family/caregiver present to determine baseline cognitive functioning Area of Impairment: Following commands;Problem solving;Attention;Memory;Awareness   Current Attention Level: Sustained Memory: Decreased short-term memory;Decreased recall of precautions Following Commands: Follows one step commands with increased time Safety/Judgement: Decreased awareness of safety;Decreased awareness of deficits Awareness: Intellectual Problem Solving: Slow processing;Difficulty sequencing;Requires verbal cues;Requires tactile cues General Comments: Pt requires step-by-step instructions (basic cues better).     Exercises General Exercises - Lower Extremity Long Arc Quad: 15 reps Hip ABduction/ADduction: 15 reps Other Exercises Other Exercises: x8 anterior leans with mod assist from therapist/tech and no UE support to maintain sternal precautions.     General Comments        Pertinent Vitals/Pain Pain Assessment: Faces Faces Pain Scale: Hurts a little bit Pain Location: Chest/incision area. Pt rated pain 8/10 however pt appeared to be in no distress  or pain and faces pain scale was at a 2.  Pain Descriptors / Indicators: Operative site guarding Pain Intervention(s): Limited activity within patient's tolerance;Monitored during session;Repositioned    Home Living                      Prior Function             PT Goals (current goals can now be found in the care plan section) Acute Rehab PT Goals Patient Stated Goal: none stated; agreeable to PT PT Goal Formulation: With patient Time For Goal Achievement: 11/02/14 Potential to Achieve Goals: Good Progress towards PT goals: Progressing toward goals    Frequency  Min 3X/week    PT Plan Current plan remains appropriate    Co-evaluation             End of Session Equipment Utilized During Treatment: Gait belt;Oxygen Activity Tolerance: Patient limited by fatigue Patient left: in chair;with call bell/phone within reach     Time: 0825-0850 PT Time Calculation (min) (ACUTE ONLY): 25 min  Charges:  $Gait Training: 8-22 mins $Therapeutic Exercise: 8-22 mins                    G Codes:      Rolinda Roan 11/22/14, 10:53 AM   Rolinda Roan, PT, DPT Acute Rehabilitation Services Pager: [phone]

## 2014-11-01 NOTE — Discharge Summary (Signed)
Physician Discharge Summary  Patient ID: Travis Romero MRN: [id number] DOB/AGE: [birth year] 64 y.o.  Admit date: 10/11/2014 Discharge date: 11/04/2014   Admission Diagnoses: Acute on chronic systolic and diastolic CHF Patient Active Problem List   Diagnosis Date Noted  . Urinary retention   . Pleural effusion   . BPH (benign prostatic hypertrophy) with urinary retention   . Coronary artery disease involving native coronary artery of native heart without angina pectoris   . SOB (shortness of breath)   . Tobacco abuse   . HLD (hyperlipidemia)   . Severe peripheral arterial disease 10/18/2014  . NSTEMI (non-ST elevated myocardial infarction)   . Acute on chronic combined systolic and diastolic CHF (congestive heart failure)   . Shortness of breath   . Adjustment disorder with anxious mood   . Acute respiratory failure with hypoxia 10/13/2014  . Acute combined systolic and diastolic CHF, NYHA class 4 10/13/2014  . Acute pulmonary edema   . Endotracheal tube present   . CHF exacerbation 10/12/2014  . Benign essential HTN 10/07/2014  . BPH (benign prostatic hyperplasia) 10/07/2014  . COPD (chronic obstructive pulmonary disease) 10/07/2014  . Smoker 10/07/2014   Discharge Diagnoses:  Acute on chronic systolic and diastolic CHF Patient Active Problem List   Diagnosis Date Noted  . 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 Urinary retention   . Pleural effusion   . BPH (benign prostatic hypertrophy) with urinary retention   . Coronary artery disease involving native coronary artery of native heart without angina pectoris   . SOB (shortness of breath)   . Tobacco abuse   . HLD (hyperlipidemia)   . Severe peripheral arterial disease 10/18/2014  . NSTEMI (non-ST elevated myocardial infarction)   . Acute on chronic combined systolic and diastolic CHF (congestive heart failure)   . Shortness of breath   . Adjustment disorder with anxious mood   . Acute respiratory failure with hypoxia 10/13/2014   . Acute combined systolic and diastolic CHF, NYHA class 4 10/13/2014  . Acute pulmonary edema   . Endotracheal tube present   . CHF exacerbation 10/12/2014  . Benign essential HTN 10/07/2014  . BPH (benign prostatic hyperplasia) 10/07/2014  . COPD (chronic obstructive pulmonary disease) 10/07/2014  . Smoker 10/07/2014   Discharged Condition: good  History of Present Illness:  Travis Romero is a 64 yo male with known history of COPD, PAD, BPH, HTN, Adjustment disorder, and tobacco abuse.  The patient noticed he was becoming more short of breath over the past week.  He states he was at work at which time a co-worker noticed he was having difficulty breathing.  EMS was called and the patient was transported to the ED.  Workup in the ED revealed progressive acute on chronic respiratory failure and required intubation.  Further results revealed the patient to be in acute systolic and diastolic CHF.  He was admitted for further management.   Hospital Course:   The patient was weaned and extubated.  He was agreesively diuresed.  An Echocardiogram was obtained and showed a reduced EF of 20-25% with mild MR.  He also underwent cardiac catheterization which revealed 3 vessel CAD.  It was felt coronary artery bypass  would be indicated and TCTS was consulted.  He was evaluated by Dr. Roxan Hockey on 10/18/2014 at which time he was in agreement the patient would require bypass surgery.  The risks and benefits of the procedure were explained to the patient and he was agreeable to proceed.  The patient  was further managed by Cardiology over the next several days.   He was felt medically stable for surgery and was taken to the operating room on 10/23/2014.  He underwent CABG x 3 utilizing LIMA to LAD, SVG to OM2, and SVG to distal RCA.  He also underwent endoscopic harvest of the greater saphenous vein from the right leg.  He tolerated the procedure and was taken to the SICU in stable condition.  He was  extubated the evening of surgery.  During his stay in the SICU, the patient was weaned off inotropes as tolerated.  His chest tubes and arterial lines were removed without difficulty.  He was tachycardic and hypertensive and his lopressor was changed to Coreg and he was restarted on Clonidine and amlodipine for hypertension.  He has been followed by the Advanced Heart Failure Team and it was decided to stop the clonidine and amlodipine and start Entresto due to his low EF. He was agressively diuresed for CHF exacerbation and hypervolemia with good response to Lasix. Co-ox on 8/1 was 63 with BNP of 699.  His pulmonary status has been an issue postop. The patient's chest x-ray showed bibasilar infiltrates and atelectasis.  Due to his severe history of COPD, he was treated with aggressive pulmonary toilet measures and nebulizer treatments. He was also started on empiric antibiotics for presumed pneumonia with leukocytosis. The patient's respiratory status continued to decline and chest x-ray showed worsening of infiltrates and atelectasis. Pulmonary consult was obtained for further treatment assistance.  He underwent ultrasound-guided thoracentesis on 10/30/2014 which removed 1.5L of bloody fluid on the left.  A bedside right thoracentesis on 10/31/2014 which removed 1200 ml of cloudy fluid. Cultures were obtained, which have been negative so far.    The patient developed severe constipation.  He was treated with disimpaction and enema which was successful.  He also had postop urinary retention requiring replacement of his Foley catheter on 7/26. We will plan to leave the Foley in place and plan a voiding trial next week per the rehab MD.  Incisions are healing well.  He remains on supplemental oxygen. He is tolerating a regular diet. He has completed a course of antibiotics and these have been discontinued.  The patient's main postoperative issue has been related to mobility. He is participating with PT and OT,  but remains difficult to mobilize and get out of bed.  Recommendation is for transfer to inpatient rehab for further therapies prior to discharge home. At the present time, he is medically stable for transfer once a bed is available.    Consults: pulmonary/intensive care and rehabilitation medicine  Significant Diagnostic Studies: angiography:    Prox RCA lesion, 100% stenosed.  Prox Cx lesion, 100% stenosed.  Dist LAD lesion, 100% stenosed.  2nd Diag lesion, 75% stenosed.  Prox LAD lesion, 60% stenosed.  There is severe left ventricular systolic dysfunction.  Treatments: procedures: thoracentesis- culture negative to date, cytology-no evidence of malignant cells  Surgery:  Median sternotomy, extracorporeal circulation, coronary artery bypass grafting x3 (left internal mammary artery to left anterior descending, saphenous vein graft to obtuse marginal 2, saphenous vein graft to distal right coronary), endoscopic vein harvest, right leg.   Disposition: CIR   Discharge medications:   Medication List    STOP taking these medications        cloNIDine 0.2 MG tablet  Commonly known as:  CATAPRES     COMBIVENT IN     cyclobenzaprine 10 MG tablet  Commonly known as:  FLEXERIL     isradipine 5 MG capsule  Commonly known as:  DYNACIRC     telmisartan 80 MG tablet  Commonly known as:  MICARDIS      TAKE these medications        ALPRAZolam 0.5 MG tablet  Commonly known as:  XANAX  Take 1 tablet (0.5 mg total) by mouth 3 (three) times daily as needed for anxiety.     arformoterol 15 MCG/2ML Nebu  Commonly known as:  BROVANA  Take 2 mLs (15 mcg total) by nebulization 2 (two) times daily.     aspirin 325 MG EC tablet  Take 1 tablet (325 mg total) by mouth daily.     atorvastatin 80 MG tablet  Commonly known as:  LIPITOR  Take 1 tablet (80 mg total) by mouth daily at 6 PM.     benzonatate 100 MG capsule  Commonly known as:  TESSALON  Take 100-200 mg by mouth 3  (three) times daily as needed for cough.     budesonide 0.5 MG/2ML nebulizer solution  Commonly known as:  PULMICORT  Take 2 mLs (0.5 mg total) by nebulization 2 (two) times daily.     carvedilol 6.25 MG tablet  Commonly known as:  COREG  Take 1 tablet (6.25 mg total) by mouth 2 (two) times daily with a meal.     docusate sodium 100 MG capsule  Commonly known as:  COLACE  Take 2 capsules (200 mg total) by mouth daily.     dutasteride 0.5 MG capsule  Commonly known as:  AVODART  Take 0.5 mg by mouth daily.     furosemide 40 MG tablet  Commonly known as:  LASIX  Take 1 tablet (40 mg total) by mouth 2 (two) times daily.     levalbuterol 0.63 MG/3ML nebulizer solution  Commonly known as:  XOPENEX  Take 3 mLs (0.63 mg total) by nebulization every 6 (six) hours as needed for wheezing or shortness of breath.     mirtazapine 30 MG tablet  Commonly known as:  REMERON  Take 1 tablet (30 mg total) by mouth at bedtime.     nicotine 21 mg/24hr patch  Commonly known as:  NICODERM CQ - dosed in mg/24 hours  Place 1 patch (21 mg total) onto the skin daily.     oxyCODONE 5 MG immediate release tablet  Commonly known as:  Oxy IR/ROXICODONE  Take 1-2 tablets (5-10 mg total) by mouth every 6 (six) hours as needed for severe pain.     pantoprazole 40 MG tablet  Commonly known as:  PROTONIX  Take 1 tablet (40 mg total) by mouth daily.     sacubitril-valsartan 49-51 MG  Commonly known as:  ENTRESTO  Take 1 tablet by mouth 2 (two) times daily.     spironolactone 25 MG tablet  Commonly known as:  ALDACTONE  Take 1 tablet (25 mg total) by mouth daily.     traMADol 50 MG tablet  Commonly known as:  ULTRAM  Take 1-2 tablets (50-100 mg total) by mouth every 4 (four) hours as needed for moderate pain.        The patient has been discharged on:  1.Beta Blocker:  Yes [x   ]                              No   [   ]  If No, reason:  2.Ace Inhibitor/ARB: Yes [ x   ]                                     No  [    ]                                     If No, reason:  3.Statin:   Yes [ x  ]                  No  [   ]                  If No, reason:  4.Ecasa:  Yes  [ x  ]                  No   [   ]                  If No, reason:   Signed: BARRETT, ERIN 11/01/2014, 9:59 AM

## 2014-11-01 NOTE — Progress Notes (Signed)
Rehab admissions - I am opening the case with [facility] for acute inpatient rehab admission.  Patient tells me that he prefers rehab here at [facility].  I spoke with social worker who tells me that we have very limited options if we pursue SNF.  I will update all after I hear back from [facility] case manager.  Call me for questions.  #[phone]

## 2014-11-01 NOTE — Progress Notes (Signed)
Occupational Therapy Treatment Patient Details Name: Travis Romero MRN: [id number] DOB: 05/05/1950 Today's Date: 11/01/2014    History of present illness Pt was admitted with progressive dyspnea and dx'd with CHF exacerbation.  He was intubated 7/8 - 7/11.  Pt has a h/o HTN, COPD, and back pain. Pt with AMS since extubation. 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    OT comments  Pt performed exercises for hand, wrist and elbow to increase ROM and strength.  Instructed in initial HEP  Follow Up Recommendations  CIR    Equipment Recommendations       Recommendations for Other Services      Precautions / Restrictions Precautions Precautions: Fall;Sternal       Mobility Bed Mobility                  Transfers                      Balance                                   ADL                                         General ADL Comments: pt very concerned about Lt hand weakness.  Focus of session was on Lt UE exercises       Vision                     Perception     Praxis      Cognition   Behavior During Therapy: Flat affect Overall Cognitive Status: No family/caregiver present to determine baseline cognitive functioning Area of Impairment: Following commands;Problem solving;Attention;Memory;Awareness   Current Attention Level: Sustained Memory: Decreased short-term memory;Decreased recall of precautions  Following Commands: Follows one step commands with increased time Safety/Judgement: Decreased awareness of safety;Decreased awareness of deficits Awareness: Intellectual Problem Solving: Slow processing;Difficulty sequencing;Requires verbal cues;Requires tactile cues      Extremity/Trunk Assessment               Exercises Other Exercises Other Exercises: elbow extension AAROM for end range isloated ROM.   Other Exercises: AAROM wrist flexion x 10 with focus on isometric hold, eccentric and concentric  contractions to build strength  Other Exercises: AAROM MCP extension x 10 x 2 sets  Other Exercises: PIP blocking exercises x 2 sets 10 Other Exercises: Mass finger flexion x 15 AROM   Shoulder Instructions       General Comments      Pertinent Vitals/ Pain       Pain Assessment: Faces Faces Pain Scale: Hurts little more Pain Location: dorsum of hand  Pain Descriptors / Indicators: Aching Pain Intervention(s): Monitored during session  Home Living                                          Prior Functioning/Environment              Frequency Min 2X/week     Progress Toward Goals  OT Goals(current goals can now be found in the care plan section)  Progress towards OT goals: Progressing toward goals  ADL Goals Pt Will Transfer to Toilet: bedside commode;stand  pivot transfer;with mod assist Pt Will Perform Toileting - Clothing Manipulation and hygiene: with max assist;sit to/from stand Additional ADL Goal #1: Will perform UB ADL at EOB with supervision. Additional ADL Goal #2: Will perform LB ADL with max assist sit to stand with AE as needed. Additional ADL Goal #3: Pt will perform AROM of L hand and wrist with supervision. Additional ADL Goal #4: Pt will recall sternal precautions verbally.  Plan Discharge plan needs to be updated    Co-evaluation                 End of Session Equipment Utilized During Treatment: Oxygen   Activity Tolerance Patient tolerated treatment well   Patient Left in chair;with call bell/phone within reach   Nurse Communication          Time: 4270-6237 OT Time Calculation (min): 84 min  Charges: OT General Charges $OT Visit: 1 Procedure OT Treatments $Therapeutic Exercise: 23-37 mins  Aundrea Horace M 11/01/2014, 6:24 PM

## 2014-11-01 NOTE — Consult Note (Signed)
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   He has no prior cardiac history admitted with progressive dyspnea. Developed acute respiratory failure requiring intubation. New onset acute systolic heart failure with EF 20-25% Had LHC with 3 vessel CAD. Had CABG x3 10/23/14.   Post operative course complicated by respiratory distress, hypertension, pleural effusion, and deconditioning. Has been on carvedilol 25 mg twice a day, amlodipine 10 mg twice a day, clonidine 0.2 mg twice a day.  SBP 110-150 .   Denies pain. Denies SOB. Complaining fatigue. Requires extensive assistance to get from bed to chair.   10/31/14 Carvedilol increased to 25 mg twice a day. Transitioned to po lasix.  10/30/14: L pleural effusion with thoracentesis  1.2 liters removed.  7/28 CABG X3 LIMA-LAD;SVG to OM,SVG to Distal RCA (N/A) 10/14/14 Extubated 10/12/2014 - ECHO EF 20-25%. Grade IDD Intubated  Review of Systems: [y] = yes, [ ]  = no   General: Weight gain [ ] ; Weight loss [Y ]; Anorexia [ ] ; Fatigue [Y ]; Fever [ ] ; Chills [ ] ; Weakness [ Y]  Cardiac: Chest pain/pressure [ ] ; Resting SOB [ ] ; Exertional SOB [Y ]; Orthopnea [ ] ; Pedal Edema [ ] ; Palpitations [ ] ; Syncope [ ] ; Presyncope [ ] ; Paroxysmal nocturnal dyspnea[ ]   Pulmonary: Cough [ ] ; Wheezing[ ] ; Hemoptysis[ ] ; Sputum [ ] ; Snoring [ ]   GI: Vomiting[ ] ; Dysphagia[ ] ; Melena[ ] ; Hematochezia [ ] ; Heartburn[ ] ; Abdominal pain [ ] ; Constipation [ ] ; Diarrhea [ ] ; BRBPR [ ]   GU: Hematuria[ ] ; Dysuria [ ] ; Nocturia[ ]   Vascular: Pain in legs with walking [ ] ; Pain in feet with lying flat [ ] ; Non-healing  sores [ ] ; Stroke [ ] ; TIA [ ] ; Slurred speech [ ] ;  Neuro: Headaches[ ] ; Vertigo[ ] ; Seizures[ ] ; Paresthesias[ ] ;Blurred vision [ ] ; Diplopia [ ] ; Vision changes [ ]   Ortho/Skin: Arthritis [ ] ; Joint pain [ ] ; Muscle pain [ ] ; Joint swelling [ ] ; Back Pain [ ] ; Rash [ ]   Psych: Depression[ ] ; Anxiety[ ]   Heme: Bleeding problems [ ] ; Clotting disorders [ ] ; Anemia [ ]   Endocrine: Diabetes [ ] ; Thyroid dysfunction[ ]   Home Medications Prior to Admission medications   Medication Sig Start Date End Date Taking? Authorizing Provider  ALPRAZolam (XANAX) 0.5 MG tablet TAKE 1 TABLET BY MOUTH 3 TIMES A DAY AS NEEDED FOR 90 DAYS 10/02/14  Yes Historical Provider, MD  benzonatate (TESSALON) 100 MG capsule Take 100-200 mg by mouth 3 (three) times daily as needed for cough.   Yes Historical Provider, MD  cloNIDine (CATAPRES) 0.2 MG tablet Take 0.2 mg by mouth 4 (four) times daily as needed.    Yes Historical Provider, MD  dutasteride (AVODART) 0.5 MG capsule Take 0.5 mg by mouth daily. 07/29/14  Yes Historical Provider, MD  Ipratropium-Albuterol ([place]) Inhale into the lungs.   Yes Historical Provider, MD  isradipine (DYNACIRC) 5 MG capsule Take 10 mg by mouth 2 (two) times daily. 08/19/14  Yes Historical Provider, MD  mirtazapine (REMERON) 45 MG tablet Take 45 mg by mouth at bedtime.   Yes Historical Provider, MD  spironolactone (ALDACTONE) 50 MG  tablet Take 75 mg by mouth daily.   Yes Historical Provider, MD  telmisartan (MICARDIS) 80 MG tablet Take 80 mg by mouth daily. 08/17/14  Yes Historical Provider, MD  traMADol (ULTRAM) 50 MG tablet Take 1 tablet (50 mg total) by mouth every 8 (eight) hours as needed. 10/07/14  Yes Chelle Jeffery, PA-C  cyclobenzaprine (FLEXERIL) 10 MG tablet Take 1 tablet (10 mg total) by mouth 3 (three) times daily as needed for muscle spasms. Patient not taking: Reported on 10/11/2014 10/07/14   Harrison Mons, PA-C    Past Medical History: Past Medical History  Diagnosis  Date  . Hypertension   . COPD (chronic obstructive pulmonary disease)   . BPH (benign prostatic hyperplasia)   . Back pain 2007    after fall down steps    Past Surgical History: Past Surgical History  Procedure Laterality Date  . Tonsillectomy    . Pyloromyotomy      pyloric stenosis  . Cardiac catheterization N/A 10/17/2014    Procedure: Right/Left Heart Cath and Coronary Angiography;  Surgeon: Troy Sine, MD;  Location: [facility];  Service: Cardiovascular;  Laterality: N/A;  . Coronary artery bypass graft N/A 10/23/2014    Procedure:   CORONARY ARTERY BYPASS GRAFTING (CABG)X3 LIMA-LAD;SVG to OM,SVG to Distal RCA;  Surgeon: Melrose Nakayama, MD;  Location: [facility];  Service: Open Heart Surgery;  Laterality: N/A;  . Tee without cardioversion N/A 10/23/2014    Procedure: TRANSESOPHAGEAL ECHOCARDIOGRAM (TEE);  Surgeon: Melrose Nakayama, MD;  Location: [facility];  Service: Open Heart Surgery;  Laterality: N/A;    Family History: Family History  Problem Relation Age of Onset  . Hypertension Mother   . Heart disease Father     Social History: History   Social History  . Marital Status: Divorced    Spouse Name: n/a  . Number of Children: 0  . Years of Education: master's   Occupational History  . licensed clinical social worker     Warden/ranger   Social History Main Topics  . Smoking status: Current Every Day Smoker -- 20 years    Types: Cigarettes  . Smokeless tobacco: Never Used     Comment: 10/2014 I am using vapor cigarettes   . Alcohol Use: 1.2 - 1.8 oz/week    2-3 Standard drinks or equivalent per week     Comment: 10/2014 " I drink everyday after supper " 2 glasses of wine   . Drug Use: No  . Sexual Activity: Not on file   Other Topics Concern  . None   Social History Narrative   Lives alone.   A cousin, Travis Romero, lives nearby.    Allergies:  Allergies  Allergen Reactions  . Advair Diskus [Fluticasone-Salmeterol] Other (See Comments)     Exacerbated BPH   . Flexeril [Cyclobenzaprine] Other (See Comments)    Caused shortness of breath and swelling in scrotum    Objective:    Scheduled Meds: . arformoterol  15 mcg Nebulization BID  . aspirin EC  325 mg Oral Daily   Or  . aspirin  324 mg Per Tube Daily  . atorvastatin  80 mg Oral q1800  . bisacodyl  10 mg Oral Daily   Or  . bisacodyl  10 mg Rectal Daily  . budesonide (PULMICORT) nebulizer solution  0.5 mg Nebulization BID  . carvedilol  6.25 mg Oral BID WC  . docusate sodium  200 mg Oral Daily  . dutasteride  0.5 mg Oral Daily  .  enoxaparin (LOVENOX) injection  40 mg Subcutaneous QHS  . furosemide  40 mg Oral BID  . insulin aspart  0-9 Units Subcutaneous TID WC  . mirtazapine  30 mg Oral QHS  . nicotine  21 mg Transdermal Daily  . pantoprazole  40 mg Oral Daily  . sacubitril-valsartan  1 tablet Oral BID  . sodium chloride  3 mL Intravenous Q12H  . spironolactone  25 mg Oral Daily   Continuous Infusions: . sodium chloride 20 mL/hr at 10/25/14 2130  . sodium chloride Stopped (10/31/14 1000)   PRN Meds:.sodium chloride, ALPRAZolam, benzonatate, hydrocerin, levalbuterol, metoprolol, morphine injection, ondansetron (ZOFRAN) IV, oxyCODONE, sodium chloride, traMADol Vital Signs:   Temp:  [96.7 F (35.9 C)-98 F (36.7 C)] 98 F (36.7 C) (07/29 0829) Pulse Rate:  [87-104] 94 (07/29 0900) Resp:  [14-27] 23 (07/29 0900) BP: (110-151)/(55-89) 110/89 mmHg (07/29 0900) SpO2:  [88 %-100 %] 93 % (07/29 0900) Weight:  [125 lb 10.6 oz (57 kg)] 125 lb 10.6 oz (57 kg) (07/29 0500) Last BM Date: 11/01/14  Weight change: Filed Weights   10/30/14 0500 10/31/14 0500 11/01/14 0500  Weight: 136 lb 7.4 oz (61.9 kg) 130 lb 1.1 oz (59 kg) 125 lb 10.6 oz (57 kg)    Intake/Output:   Intake/Output Summary (Last 24 hours) at 11/01/14 0918 Last data filed at 11/01/14 0600  Gross per 24 hour  Intake    490 ml  Output   1480 ml  Net   -990 ml     Physical Exam: General:   Weak appearing.Looks older than stated age No resp difficulty. In chair.  HEENT: normal Neck: supple. JVP 7-8  . Carotids 2+ bilat; no bruits. No lymphadenopathy or thryomegaly appreciated. Cor: PMI nondisplaced. Regular rate & rhythm. No rubs or murmurs. +S3 . Sternal Dressing CDI  Lungs: clear. On 2 liters [place] Abdomen: soft, nontender, nondistended. No hepatosplenomegaly. No bruits or masses. Good bowel sounds. Extremities: no cyanosis, clubbing, rash, edema Neuro: alert & orientedx3, cranial nerves grossly intact. moves all 4 extremities w/o difficulty. Affect pleasant  Telemetry: NSR 80s   Labs: Basic Metabolic Panel:  Recent Labs Lab 10/28/14 0208 10/29/14 0252 10/30/14 0225 10/31/14 0231 11/01/14 0219  NA 136 136 133* 137 136  K 4.4 5.0 4.8 3.7 4.3  CL 95* 94* 92* 91* 95*  CO2 33* 36* 37* 38* 35*  GLUCOSE 149* 125* 155* 95 117*  BUN 21* 19 21* 16 17  CREATININE 1.13 0.87 1.10 1.06 1.22  CALCIUM 8.9 9.0 8.7* 8.6* 8.6*    Liver Function Tests:  Recent Labs Lab 10/30/14 0225 10/30/14 1335  AST 15  --   ALT 22  --   ALKPHOS 66  --   BILITOT 0.6  --   PROT 4.8* 5.3*  ALBUMIN 2.0*  --    No results for input(s): LIPASE, AMYLASE in the last 168 hours. No results for input(s): AMMONIA in the last 168 hours.  CBC:  Recent Labs Lab 10/28/14 0208 10/29/14 0252 10/30/14 0225 10/31/14 0231 11/01/14 0219  WBC 15.0* 12.6* 12.8* 12.2* 11.0*  HGB 9.8* 9.8* 9.2* 9.1* 9.1*  HCT 30.4* 30.9* 29.1* 28.4* 27.7*  MCV 110.9* 113.2* 113.2* 111.4* 110.8*  PLT 355 375 425* 470* 477*    Cardiac Enzymes: No results for input(s): CKTOTAL, CKMB, CKMBINDEX, TROPONINI in the last 168 hours.  BNP: BNP (last 3 results)  Recent Labs  10/11/14 1959 10/12/14 1337 10/29/14 0812  BNP 1073.2* 981.7* 1031.0*    ProBNP (last  3 results) No results for input(s): PROBNP in the last 8760 hours.   CBG:  Recent Labs Lab 10/31/14 0857 10/31/14 1233 10/31/14 1610  10/31/14 2153 11/01/14 0827  GLUCAP 104* 149* 131* 125* 109*    Coagulation Studies: No results for input(s): LABPROT, INR in the last 72 hours.  Other results:   Imaging: Dg Chest Port 1 View  11/01/2014   CLINICAL DATA:  Coronary artery bypass graft  EXAM: PORTABLE CHEST - 1 VIEW  COMPARISON:  10/31/2014  FINDINGS: Bibasilar airspace disease increased. Cardiomegaly. Postoperative changes. Right upper lobe emphysema. No pneumothorax.  IMPRESSION: Worsening bibasilar airspace disease.   Electronically Signed   By: Marybelle Killings M.D.   On: 11/01/2014 07:50   Dg Chest Port 1 View  10/31/2014   CLINICAL DATA:  Status post coronary bypass grafting  EXAM: PORTABLE CHEST - 1 VIEW  COMPARISON:  10/30/2014  FINDINGS: Cardiac shadow is again mildly enlarged. Postsurgical changes are again seen. Aortic calcifications are noted. No significant right-sided pleural effusion is seen. Emphysematous changes are noted in the right apex. Mild persistent left retrocardiac atelectasis is seen. A small left pleural effusion is noted.  IMPRESSION: Persistent left retrocardiac density.  Small left pleural effusion.   Electronically Signed   By: Inez Catalina M.D.   On: 10/31/2014 07:51   Dg Chest Port 1 View  10/30/2014   CLINICAL DATA:  Pleural effusion.  EXAM: PORTABLE CHEST - 1 VIEW  COMPARISON:  Same day.  FINDINGS: Stable cardiomegaly. Status post coronary artery bypass graft. Emphysematous disease is noted in the right upper lobe. No pneumothorax is noted. Right pleural effusion noted on prior exam is significantly smaller. Mild right basilar subsegmental atelectasis is noted. Stable left basilar opacity is noted concerning for edema or atelectasis with possible associated effusion. Bony thorax is intact.  IMPRESSION: No definite pneumothorax is noted. Right pleural effusion is significantly smaller status post thoracentesis. Emphysematous changes noted in right upper lobe. Mild right basilar subsegmental  atelectasis is noted. Left basilar opacity is noted concerning for edema or atelectasis with associated pleural effusion.   Electronically Signed   By: Marijo Conception, M.D.   On: 10/30/2014 12:32      Medications:     Current Medications: . amLODipine  10 mg Oral BID  . arformoterol  15 mcg Nebulization BID  . aspirin EC  325 mg Oral Daily   Or  . aspirin  324 mg Per Tube Daily  . atorvastatin  80 mg Oral q1800  . bisacodyl  10 mg Oral Daily   Or  . bisacodyl  10 mg Rectal Daily  . budesonide (PULMICORT) nebulizer solution  0.5 mg Nebulization BID  . carvedilol  25 mg Oral BID WC  . cloNIDine  0.2 mg Oral BID  . docusate sodium  200 mg Oral Daily  . dutasteride  0.5 mg Oral Daily  . enoxaparin (LOVENOX) injection  40 mg Subcutaneous QHS  . furosemide  40 mg Oral BID  . insulin aspart  0-9 Units Subcutaneous TID WC  . irbesartan  150 mg Oral Daily  . mirtazapine  30 mg Oral QHS  . nicotine  21 mg Transdermal Daily  . pantoprazole  40 mg Oral Daily  . sodium chloride  3 mL Intravenous Q12H     Infusions: . sodium chloride 20 mL/hr at 10/25/14 2130  . sodium chloride Stopped (10/31/14 1000)      Assessment:  1. Hypoxia- Acute Respiratory Failure- -> Short Term Intubation--on  2 liters [place] 2. ICM---> CABG x3 7/20 IMA-LAD;SVG to OM,SVG to Distal RCA (N/A). On statin, bb, aspirin  3. Acute Systolic Heart Failure- ECHO 10/12/14 EF 20% ICM per LHC   Volume status stable. Continue lasix 40 mg bid. Add 25 mg spiro daily. Cut back carvedilol to 6.25 mg twice a day. Start entresto 49-51 mg twice a day.  Stop clonidine and amlodipine.  4. HTN- Stop clonidine and amlodipine. Start entresto 49-51 mg twice a day.  5. L Pleural Effusion- S/P L thoracentesis 7/28  6. Deconditioning.--> PT following.    7. Severe protein-calorie malnutrition  Length of Stay: 20  CLEGG,AMY NP-C    11/01/2014, 9:18 AM  Advanced Heart Failure Team Pager [phone] (M-F; 7a - 4p)  Please contact  [facility] for night-coverage after hours (4p -7a ) and weekends on [email or web address]  Patient seen and examined with Darrick Grinder, NP. We discussed all aspects of the encounter. I agree with the assessment and plan as stated above.   He is markedly debilitated and frail post CABG. His EF 20% but has struggled with hypertension and weakness. Agree with stopping clonidine and amlodipine. Switch to [facility]. Can titrate as BP and renal function tolerate. Cut carvedilol back. Will place PICC line and follow CVP and co-ox. Needs aggressive PT/OT and likely SNF vs CIR.   Luella Gardenhire,MD 6:07 PM

## 2014-11-01 NOTE — Progress Notes (Signed)
      [address]       [place] [address]             [phone]       Up in chair eating- appetite is good  BP 132/66 mmHg  Pulse 93  Temp(Src) 97.6 F (36.4 C) (Oral)  Resp 20  Ht 5\' 7"  (1.702 m)  Wt 125 lb 10.6 oz (57 kg)  BMI 19.68 kg/m2  SpO2 97%   Intake/Output Summary (Last 24 hours) at 11/01/14 1844 Last data filed at 11/01/14 1100  Gross per 24 hour  Intake      0 ml  Output   1095 ml  Net  -1095 ml    Making progress slowly  Hopefully ready for inpatient rehab on Monday  Remo Lipps C. Roxan Hockey, MD [facility] [phone]

## 2014-11-01 NOTE — Progress Notes (Signed)
Rehab admissions - Spoke with attending MD and patient not ready for acute inpatient rehab today.  I do have authorization from insurance case manager for acute inpatient rehab admission and can admit on Monday, 11/04/14, if patient is medically ready.  I did speak with patient's sister and cousin today and I have updated them.  Call me for questions.  #[phone]

## 2014-11-01 NOTE — Discharge Instructions (Signed)
Coronary Artery Bypass Grafting, Care After °Refer to this sheet in the next few weeks. These instructions provide you with information on caring for yourself after your procedure. Your health care provider may also give you more specific instructions. Your treatment has been planned according to current medical practices, but problems sometimes occur. Call your health care provider if you have any problems or questions after your procedure. °WHAT TO EXPECT AFTER THE PROCEDURE °Recovery from surgery will be different for everyone. Some people feel well after 3 or 4 weeks, while for others it takes longer. After your procedure, it is typical to have the following: °· Nausea and a lack of appetite.   °· Constipation. °· Weakness and fatigue.   °· Depression or irritability.   °· Pain or discomfort at your incision site. °HOME CARE INSTRUCTIONS °· Take medicines only as directed by your health care provider. Do not stop taking medicines or start any new medicines without first checking with your health care provider. °· Take your pulse as directed by your health care provider. °· Perform deep breathing as directed by your health care provider. If you were given a device called an incentive spirometer, use it to practice deep breathing several times a day. Support your chest with a pillow or your arms when you take deep breaths or cough. °· Keep incision areas clean, dry, and protected. Remove or change any bandages (dressings) only as directed by your health care provider. You may have skin adhesive strips over the incision areas. Do not take the strips off. They will fall off on their own. °· Check incision areas daily for any swelling, redness, or drainage. °· If incisions were made in your legs, do the following: °¨ Avoid crossing your legs.   °¨ Avoid sitting for long periods of time. Change positions every 30 minutes.   °¨ Elevate your legs when you are sitting. °· Wear compression stockings as directed by your  health care provider. These stockings help keep blood clots from forming in your legs. °· Take showers once your health care provider approves. Until then, only take sponge baths. Pat incisions dry. Do not rub incisions with a washcloth or towel. Do not take baths, swim, or use a hot tub until your health care provider approves. °· Eat foods that are high in fiber, such as raw fruits and vegetables, whole grains, beans, and nuts. Meats should be lean cut. Avoid canned, processed, and fried foods. °· Drink enough fluid to keep your urine clear or pale yellow. °· Weigh yourself every day. This helps identify if you are retaining fluid that may make your heart and lungs work harder. °· Rest and limit activity as directed by your health care provider. You may be instructed to: °¨ Stop any activity at once if you have chest pain, shortness of breath, irregular heartbeats, or dizziness. Get help right away if you have any of these symptoms. °¨ Move around frequently for short periods or take short walks as directed by your health care provider. Increase your activities gradually. You may need physical therapy or cardiac rehabilitation to help strengthen your muscles and build your endurance. °¨ Avoid lifting, pushing, or pulling anything heavier than 10 lb (4.5 kg) for at least 6 weeks after surgery. °· Do not drive until your health care provider approves.  °· Ask your health care provider when you may return to work. °· Ask your health care provider when you may resume sexual activity. °· Keep all follow-up visits as directed by your health care   provider. This is important. °SEEK MEDICAL CARE IF: °· You have swelling, redness, increasing pain, or drainage at the site of an incision. °· You have a fever. °· You have swelling in your ankles or legs. °· You have pain in your legs.   °· You gain 2 or more pounds (0.9 kg) a day. °· You are nauseous or vomit. °· You have diarrhea.  °SEEK IMMEDIATE MEDICAL CARE IF: °· You have  chest pain that goes to your jaw or arms. °· You have shortness of breath.   °· You have a fast or irregular heartbeat.   °· You notice a "clicking" in your breastbone (sternum) when you move.   °· You have numbness or weakness in your arms or legs. °· You feel dizzy or light-headed.   °MAKE SURE YOU: °· Understand these instructions. °· Will watch your condition. °· Will get help right away if you are not doing well or get worse. °Document Released: 10/09/2004 Document Revised: 08/06/2013 Document Reviewed: 08/29/2012 °Exit[employer]® Patient Information ©2015 [employer]. This information is not intended to replace advice given to you by your health care provider. Make sure you discuss any questions you have with your health care provider. ° °Endoscopic Saphenous Vein Harvesting °Care After °Refer to this sheet in the next few weeks. These instructions provide you with information on caring for yourself after your procedure. Your health care provider may also give you more specific instructions. Your treatment has been planned according to current medical practices, but problems sometimes occur. Call your health care provider if you have any problems or questions after your procedure. °HOME CARE INSTRUCTIONS °Medicine °· Take whatever pain medicine your surgeon prescribes. Follow the directions carefully. Do not take over-the-counter pain medicine unless your surgeon says it is okay. Some pain medicine can cause bleeding problems for several weeks after surgery. °· Follow your surgeon's instructions about driving. You will probably not be permitted to drive after heart surgery. °· Take any medicines your surgeon prescribes. Any medicines you took before your heart surgery should be checked with your health care provider before you start taking them again. °Wound care °· If your surgeon has prescribed an elastic bandage or stocking, ask how long you should wear it. °· Check the area around your surgical cuts  (incisions) whenever your bandages (dressings) are changed. Look for any redness or swelling. °· You will need to return to have the stitches (sutures) or staples taken out. Ask your surgeon when to do that. °· Ask your surgeon when you can shower or bathe. °Activity °· Try to keep your legs raised when you are sitting. °· Do any exercises your health care providers have given you. These may include deep breathing exercises, coughing, walking, or other exercises. °SEEK MEDICAL CARE IF: °· You have any questions about your medicines. °· You have more leg pain, especially if your pain medicine stops working. °· New or growing bruises develop on your leg. °· Your leg swells, feels tight, or becomes red. °· You have numbness in your leg. °SEEK IMMEDIATE MEDICAL CARE IF: °· Your pain gets much worse. °· Blood or fluid leaks from any of the incisions. °· Your incisions become warm, swollen, or red. °· You have chest pain. °· You have trouble breathing. °· You have a fever. °· You have more pain near your leg incision. °MAKE SURE YOU: °· Understand these instructions. °· Will watch your condition. °· Will get help right away if you are not doing well or get worse. °Document Released: 12/02/2010   Document Revised: 03/27/2013 Document Reviewed: 12/02/2010 °ExitCare® Patient Information ©2015 [employer]. This information is not intended to replace advice given to you by your health care provider. Make sure you discuss any questions you have with your health care provider. ° ° °

## 2014-11-01 NOTE — Progress Notes (Signed)
RT not available, RN student did flutter with patient.

## 2014-11-01 NOTE — Progress Notes (Signed)
Pt not in distress at this time. Tolerating 2L nasal cannula well, does not need BiPAP at this time.

## 2014-11-01 NOTE — PMR Pre-admission (Signed)
PMR Admission Coordinator Pre-Admission Assessment  Patient: Travis Romero is an 64 y.o., male MRN: [id number] DOB: Jul 17, 1950 Height: 5' 7"  (170.2 cm) Weight: 56.2 kg (123 lb 14.4 oz)              Insurance Information HMO:      PPO: Yes     PCP:       IPA:       80/20:       OTHER:  Group # [id number] PRIMARY: [employer]      Policy#: [id number]      Subscriber: Wende Bushy CM Name: Dudley Major (with [facility])      Phone#: [phone]     Fax#: [phone] Pre-Cert#: [id number] X 7 days with update due 11/11/14      Employer: [employer] Benefits:  Phone #: [phone] (Key Benefit Admin.)     Name: Helene Kelp [facility] services) Irene Shipper. Date: 11/04/10     Deduct: $3000 (met $1987.91)      Out of Pocket Max: [phone] (met [phone])      Life Max: None CIR: 60% w/auth      SNF: 60% with 70 days per year Outpatient: 30 visits/year     Co-Pay: $40 copay Home Health: 60%      Co-Pay: 40% DME: 60%     Co-Pay: 40% Providers: in network  Emergency Contact Information Contact Information    Name Relation Home Work Mobile   Francis Sister   [phone]   Inda Castle [phone]  [phone]     Current Medical History  Patient Admitting Diagnosis: Debility after respiratory failure, CHF exacerbation   History of Present Illness: A 63 y.o. male with history of HTN, COPD, tobacco abuse who was admitted on 10/11/14 with progressive SOB due to acute on chronic respiratory failure and was intubation at admission. He was found to have acute systolic CHF requiring aggressive diuresis and 2 D echo done revealing EF 20-25% with mild MR. 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 Post op extubated without difficulty and volume excess treated with diuresis. Foley placed due to urinary retention and he was started on IV antibiotics due ongoing hypoxia and concerns of COPD exacerbation/presumed PNA. He underwent thoracocentesis  of 1.5 L of dark blood fluid on 07/26 and PCCM consulted for input. Aggressive pulmonary toilet, increase in activity as well as chest PT recommended. He underwent right thoracocentesis on 07/27 with improvement in respiratory status. Patient has had issues with confusion, processing deficits, poor safety awareness as well as left hand/wrist weakness. Therapy ongoing and CIR recommended due to severe deconditioning.     Past Medical History  Past Medical History  Diagnosis Date  . Hypertension   . COPD (chronic obstructive pulmonary disease)   . BPH (benign prostatic hyperplasia)   . Back pain 2007    after fall down steps    Family History  family history includes Heart disease in his father; Hypertension in his mother.  Prior Rehab/Hospitalizations:  Has the patient had major surgery during 100 days prior to admission? No  Current Medications   Current facility-administered medications:  .  0.45 % sodium chloride infusion, , Intravenous, Continuous PRN, John Giovanni, PA-C, Last Rate: 20 mL/hr at 10/25/14 2130 .  0.9 %  sodium chloride infusion, , Intravenous, Continuous, Melrose Nakayama, MD, Stopped at 10/31/14 1000 .  ALPRAZolam Duanne Moron) tablet 0.5 mg, 0.5 mg, Oral, TID PRN, Melrose Nakayama,  MD, 0.5 mg at 11/03/14 2240 .  arformoterol (BROVANA) nebulizer solution 15 mcg, 15 mcg, Nebulization, BID, Juanito Doom, MD, 15 mcg at 11/04/14 0904 .  aspirin EC tablet 325 mg, 325 mg, Oral, Daily, 325 mg at 11/03/14 1049 **OR** aspirin chewable tablet 324 mg, 324 mg, Per Tube, Daily, Wayne E Gold, PA-C .  atorvastatin (LIPITOR) tablet 80 mg, 80 mg, Oral, q1800, Troy Sine, MD, 80 mg at 11/03/14 1819 .  benzonatate (TESSALON) capsule 100-200 mg, 100-200 mg, Oral, TID PRN, Melrose Nakayama, MD .  bisacodyl (DULCOLAX) EC tablet 10 mg, 10 mg, Oral, Daily, 10 mg at 11/03/14 1048 **OR** bisacodyl (DULCOLAX) suppository 10 mg, 10 mg, Rectal, Daily, Wayne E Gold, PA-C .   budesonide (PULMICORT) nebulizer solution 0.5 mg, 0.5 mg, Nebulization, BID, Juanito Doom, MD, 0.5 mg at 11/04/14 0904 .  carvedilol (COREG) tablet 9.375 mg, 9.375 mg, Oral, BID WC, Amy D Clegg, NP .  digoxin (LANOXIN) tablet 0.125 mg, 0.125 mg, Oral, Daily, Amy D Clegg, NP .  docusate sodium (COLACE) capsule 200 mg, 200 mg, Oral, Daily, Wayne E Gold, PA-C, 200 mg at 11/03/14 1049 .  dutasteride (AVODART) capsule 0.5 mg, 0.5 mg, Oral, Daily, Melrose Nakayama, MD, 0.5 mg at 11/03/14 1049 .  enoxaparin (LOVENOX) injection 40 mg, 40 mg, Subcutaneous, QHS, Melrose Nakayama, MD, 40 mg at 11/03/14 2228 .  [START ON 11/05/2014] furosemide (LASIX) tablet 40 mg, 40 mg, Oral, Daily, Amy D Clegg, NP .  gi cocktail (Maalox,Lidocaine,Donnatal), 30 mL, Oral, TID PRN, Melrose Nakayama, MD .  hydrocerin (EUCERIN) cream, , Topical, PRN, Gaye Pollack, MD .  insulin aspart (novoLOG) injection 0-9 Units, 0-9 Units, Subcutaneous, TID WC, Melrose Nakayama, MD, 1 Units at 11/04/14 [phone]  levalbuterol (XOPENEX) nebulizer solution 0.63 mg, 0.63 mg, Nebulization, Q6H PRN, Juanito Doom, MD .  metoprolol (LOPRESSOR) injection 2.5-5 mg, 2.5-5 mg, Intravenous, Q2H PRN, Wayne E Gold, PA-C, 5 mg at 10/28/14 0207 .  mirtazapine (REMERON) tablet 30 mg, 30 mg, Oral, QHS, Brandi L Ollis, NP, 30 mg at 11/03/14 2231 .  morphine 2 MG/ML injection 2-5 mg, 2-5 mg, Intravenous, Q2H PRN, Donita Brooks, NP .  nicotine (NICODERM CQ - dosed in mg/24 hours) patch 21 mg, 21 mg, Transdermal, Daily, Gaye Pollack, MD, 21 mg at 11/03/14 1049 .  ondansetron (ZOFRAN) injection 4 mg, 4 mg, Intravenous, Q6H PRN, Wayne E Gold, PA-C .  oxyCODONE (Oxy IR/ROXICODONE) immediate release tablet 5-10 mg, 5-10 mg, Oral, Q6H PRN, Donita Brooks, NP, 5 mg at 10/30/14 0820 .  pantoprazole (PROTONIX) EC tablet 40 mg, 40 mg, Oral, Daily, Wayne E Gold, PA-C, 40 mg at 11/03/14 1049 .  sacubitril-valsartan (ENTRESTO) 49-51 mg per tablet, 1  tablet, Oral, BID, Melrose Nakayama, MD, 1 tablet at 11/03/14 2230 .  sodium chloride 0.9 % injection 10-40 mL, 10-40 mL, Intracatheter, Q12H, Melrose Nakayama, MD, 10 mL at 11/03/14 2200 .  sodium chloride 0.9 % injection 10-40 mL, 10-40 mL, Intracatheter, PRN, Melrose Nakayama, MD .  spironolactone (ALDACTONE) tablet 25 mg, 25 mg, Oral, Daily, Melrose Nakayama, MD, 25 mg at 11/03/14 1049 .  traMADol (ULTRAM) tablet 50-100 mg, 50-100 mg, Oral, Q4H PRN, Wayne E Gold, PA-C, 50 mg at 11/04/14 8295 .  zolpidem (AMBIEN) tablet 5 mg, 5 mg, Oral, QHS PRN, Melrose Nakayama, MD, 5 mg at 11/03/14 2231  Patients Current Diet: Diet heart healthy/carb modified Room service  appropriate?: Yes; Fluid consistency:: Thin  Precautions / Restrictions Precautions Precautions: Fall, Sternal Precaution Comments: watch sats Restrictions Weight Bearing Restrictions: No   Has the patient had 2 or more falls or a fall with injury in the past year?No.  Has had 1 fall which resulted in soreness.  Prior Activity Level Community (5-7x/wk): Went out daily.  Worked [employer]. Was driving.  Home Assistive Devices / Equipment Home Assistive Devices/Equipment: None  Prior Device Use: Indicate devices/aids used by the patient prior to current illness, exacerbation or injury? None of the above.  No devices used PTA>  Prior Functional Level Prior Function Level of Independence: Independent Comments: was working  Self Care: Did the patient need help bathing, dressing, using the toilet or eating?  Independent  Indoor Mobility: Did the patient need assistance with walking from room to room (with or without device)? Independent  Stairs: Did the patient need assistance with internal or external stairs (with or without device)? Independent  Functional Cognition: Did the patient need help planning regular tasks such as shopping or remembering to take medications? Independent  Current Functional  Level Cognition  Overall Cognitive Status: No family/caregiver present to determine baseline cognitive functioning Current Attention Level: Sustained Orientation Level: Oriented X4 Following Commands: Follows one step commands with increased time Safety/Judgement: Decreased awareness of safety, Decreased awareness of deficits General Comments: Pt requires step-by-step instructions (basic cues better).     Extremity Assessment (includes Sensation/Coordination)  Upper Extremity Assessment: LUE deficits/detail LUE Deficits / Details: mild edema in hand with 4-/5 gross grasp, 4/5 wrist extension and inability to extend 3rd to 5th fingers fully LUE Coordination: decreased fine motor, decreased gross motor  Lower Extremity Assessment: Defer to PT evaluation    ADLs  Overall ADL's : Needs assistance/impaired Eating/Feeding: Set up, Bed level Grooming: Wash/dry hands, Wash/dry face, Set up, Bed level Grooming Details (indicate cue type and reason): decreased thoroughness, needs cues for task persistence Upper Body Bathing: Moderate assistance, Sitting Lower Body Bathing: Total assistance, Bed level Upper Body Dressing : Minimal assistance, Sitting Lower Body Dressing: Total assistance, Bed level General ADL Comments: pt very concerned about Lt hand weakness.  Focus of session was on Lt UE exercises     Mobility  Overal bed mobility: Needs Assistance, +2 for physical assistance Bed Mobility: Rolling, Sidelying to Sit Rolling: Min assist Sidelying to sit: Min assist Supine to sit: Mod assist, +2 for safety/equipment, HOB elevated Sit to supine: +2 for physical assistance, Max assist General bed mobility comments: Pt sitting up in recliner upon PT arrival.     Transfers  Overall transfer level: Needs assistance Equipment used: Rolling walker (2 wheeled) Transfers: Sit to/from Stand Sit to Stand: Total assist, +2 physical assistance Stand pivot transfers: +2 physical assistance, Max  assist Squat pivot transfers: Max assist General transfer comment: Attempted sit<>stand x2 at edge of chair. Attempted first with one person on each side of him for support, and again with therapist in front of him and tech in the back to assist with hip extension. We were unable to achieve full standing, and pt with increased difficulty extending both hips and knees.     Ambulation / Gait / Stairs / Wheelchair Mobility  Ambulation/Gait Ambulation/Gait assistance: Min assist, +2 physical assistance, +2 safety/equipment Ambulation Distance (Feet): 5 Feet Assistive device: None Gait Pattern/deviations: Step-through pattern, Decreased stride length, Wide base of support, Trunk flexed General Gait Details: Unable to attempt this session as pt with increased difficulty with standing activity from the low  recliner chair.  Gait velocity: very slow    Posture / Balance Dynamic Sitting Balance Sitting balance - Comments: feet unsupported initially and mod assist to maintain balance (posterior lean) Balance Overall balance assessment: Needs assistance Sitting-balance support: Feet supported, No upper extremity supported Sitting balance-Leahy Scale: Fair Sitting balance - Comments: feet unsupported initially and mod assist to maintain balance (posterior lean) Postural control: Posterior lean Standing balance support: Bilateral upper extremity supported, During functional activity Standing balance-Leahy Scale: Zero Standing balance comment: stood x 60 seconds for pericare with RW progressing from mod assist of one to max assist of 2 due to retropulsion    Special needs/care consideration BiPAP/CPAP No CPM No Continuous Drip IV KVO Dialysis No      Life Vest No Oxygen Yes, 02 at 2L/[place] Special Bed No Trach Size  Wound Vac (area) No     Skin Had a rash on his legs PTA. Post op dressings right thigh and mid chest.                         Bowel mgmt: Last BM 11/03/14 Bladder mgmt: Urinary  catheter Diabetic mgmt No    Previous Home Environment Living Arrangements: Alone Home Care Services: No  Discharge Living Setting Plans for Discharge Living Setting: Alone, Apartment Type of Home at Discharge: Apartment Discharge Home Layout: One level (Apartment is on the second level.) Discharge Home Access: Stairs to enter Entrance Stairs-Number of Steps: at least 10 steps to 2nd level apartment and stairs are rusty and old. Does the patient have any problems obtaining your medications?: No  Social/Family/Support Systems Patient Roles: Other (Comment) (Has a sister and a cousin.) Contact Information: Achille Rich - sister [phone] Anticipated Caregiver: sister for a week after rehab stay Anticipated Caregiver's Contact Information: Sister is staying with cousin, Carrington Clamp, [phone] Ability/Limitations of Caregiver: Sister can provide supervision and stay in town for a week to assist after discharge from rehab. Caregiver Availability: Other (Comment) (Has help for 1 week after discharge.) Discharge Plan Discussed with Primary Caregiver: Yes Is Caregiver In Agreement with Plan?: Yes Does Caregiver/Family have Issues with Lodging/Transportation while Pt is in Rehab?: No (Sister from out of town staying with cousin.)  Goals/Additional Needs Patient/Family Goal for Rehab: PT/OT mod I/supervision goals Expected length of stay: 13-18 days Cultural Considerations: None Dietary Needs: Heart healthy, carb mod, thin liquids Equipment Needs: TBD Pt/Family Agrees to Admission and willing to participate: Yes Program Orientation Provided & Reviewed with Pt/Caregiver Including Roles  & Responsibilities: Yes  Decrease burden of Care through IP rehab admission:  N/A  Possible need for SNF placement upon discharge: Yes, if patient does not progress to point where he can discharge home with short term follow up care.  Patient Condition: This patient's medical and functional status  has changed since the consult dated: 10/31/14 in which the Rehabilitation Physician determined and documented that the patient's condition is appropriate for intensive rehabilitative care in an inpatient rehabilitation facility. See "History of Present Illness" (above) for medical update. Functional changes are: Currently requiring total assist + 2 for transfers. Patient's medical and functional status update has been discussed with the Rehabilitation physician and patient remains appropriate for inpatient rehabilitation. Will admit to inpatient rehab today.  Preadmission Screen Completed By:  Retta Diones, 11/04/2014 9:29 AM ______________________________________________________________________   Discussed status with Dr. Naaman Plummer on 11/04/14 at  [phone] and received telephone approval for admission today.  Admission Coordinator:  Retta Diones,  time0929/Date08/01/16

## 2014-11-02 LAB — GLUCOSE, CAPILLARY
GLUCOSE-CAPILLARY: 106 mg/dL — AB (ref 65–99)
GLUCOSE-CAPILLARY: 102 mg/dL — AB (ref 65–99)
Glucose-Capillary: 136 mg/dL — ABNORMAL HIGH (ref 65–99)
Glucose-Capillary: 133 mg/dL — ABNORMAL HIGH (ref 65–99)

## 2014-11-02 LAB — BODY FLUID CULTURE: Culture: NO GROWTH

## 2014-11-02 LAB — ADENOSIDE DEAMINASE, PLEURAL FL: ADENOSIDE DEAMINASE, PLEURAL FL: 5.9 U/L (ref 0.0–9.4)

## 2014-11-02 MED ORDER — GI COCKTAIL ~~LOC~~
30.0000 mL | Freq: Three times a day (TID) | ORAL | Status: DC | PRN
  Filled 2014-11-02 (×2): qty 30

## 2014-11-02 MED ORDER — SODIUM CHLORIDE 0.9 % IJ SOLN
10.0000 mL | Freq: Two times a day (BID) | INTRAMUSCULAR | Status: DC
  Administered 2014-11-02 – 2014-11-04 (×4): 10 mL

## 2014-11-02 MED ORDER — SODIUM CHLORIDE 0.9 % IJ SOLN: 10.0000 mL | INTRAMUSCULAR | Status: DC | PRN

## 2014-11-02 MED ORDER — ZOLPIDEM TARTRATE 5 MG PO TABS
5.0000 mg | ORAL_TABLET | Freq: Every evening | ORAL | Status: DC | PRN
  Administered 2014-11-03: 5 mg via ORAL
  Filled 2014-11-02: qty 1

## 2014-11-02 NOTE — Progress Notes (Signed)
      [address]       [place] [address]             [phone]      Just had PICC placed  BP 139/75 mmHg  Pulse 106  Temp(Src) 97.3 F (36.3 C) (Oral)  Resp 20  Ht 5\' 7"  (1.702 m)  Wt 128 lb 15.5 oz (58.5 kg)  BMI 20.19 kg/m2  SpO2 97%   Intake/Output Summary (Last 24 hours) at 11/02/14 1808 Last data filed at 11/02/14 1300  Gross per 24 hour  Intake   1080 ml  Output   1700 ml  Net   -620 ml    Stable day  Aiming for Rehab Monday  Remo Lipps C. Roxan Hockey, MD [facility] [phone]

## 2014-11-02 NOTE — Progress Notes (Signed)
Patient ID: Travis Romero, male   DOB: [birth year], 64 y.o.   MRN: [id number]    Subjective:  Sitting in chair no dyspnea weak  Objective:  Filed Vitals:   11/02/14 0925 11/02/14 1000 11/02/14 1100 11/02/14 1200  BP:  134/75 132/71 134/78  Pulse:  97 100 102  Temp:    97.7 F (36.5 C)  TempSrc:    Oral  Resp:  23 16 24   Height:      Weight:      SpO2: 96% 87% 98% 94%    Intake/Output from previous day:  Intake/Output Summary (Last 24 hours) at 11/02/14 1245 Last data filed at 11/02/14 0900  Gross per 24 hour  Intake    840 ml  Output   2050 ml  Net  -1210 ml    Physical Exam: Affect appropriate Chronically ill  HEENT: normal Neck supple with no adenopathy JVP normal no bruits no thyromegaly Lungs clear with no wheezing and good diaphragmatic motion Heart:  S1/S2 post CABG sternotomy  PMI normal Abdomen: benighn, BS positve, no tenderness, no AAA no bruit.  No HSM or HJR Distal pulses intact with no bruits No edema Neuro non-focal Skin warm and dry No muscular weakness   Lab Results: Basic Metabolic Panel:  Recent Labs  10/31/14 0231 11/01/14 0219  NA 137 136  K 3.7 4.3  CL 91* 95*  CO2 38* 35*  GLUCOSE 95 117*  BUN 16 17  CREATININE 1.06 1.22  CALCIUM 8.6* 8.6*   Liver Function Tests:  Recent Labs  10/30/14 1335  PROT 5.3*   CBC:  Recent Labs  10/31/14 0231 11/01/14 0219  WBC 12.2* 11.0*  HGB 9.1* 9.1*  HCT 28.4* 27.7*  MCV 111.4* 110.8*  PLT 470* 477*   Fasting Lipid Panel:  Recent Labs  10/30/14 1335  CHOL 101    Imaging: Dg Chest Port 1 View  11/01/2014   CLINICAL DATA:  Coronary artery bypass graft  EXAM: PORTABLE CHEST - 1 VIEW  COMPARISON:  10/31/2014  FINDINGS: Bibasilar airspace disease increased. Cardiomegaly. Postoperative changes. Right upper lobe emphysema. No pneumothorax.  IMPRESSION: Worsening bibasilar airspace disease.   Electronically Signed   By: Marybelle Killings M.D.   On: 11/01/2014 07:50    Cardiac  Studies:  ECG:  ST RBBB IMI     Telemetry:  SR/ST  No PAF  11/02/2014   Echo:  7/9  Reviewed  EF 20-25%    Medications:   . arformoterol  15 mcg Nebulization BID  . aspirin EC  325 mg Oral Daily   Or  . aspirin  324 mg Per Tube Daily  . atorvastatin  80 mg Oral q1800  . bisacodyl  10 mg Oral Daily   Or  . bisacodyl  10 mg Rectal Daily  . budesonide (PULMICORT) nebulizer solution  0.5 mg Nebulization BID  . carvedilol  6.25 mg Oral BID WC  . docusate sodium  200 mg Oral Daily  . dutasteride  0.5 mg Oral Daily  . enoxaparin (LOVENOX) injection  40 mg Subcutaneous QHS  . furosemide  40 mg Oral BID  . insulin aspart  0-9 Units Subcutaneous TID WC  . mirtazapine  30 mg Oral QHS  . nicotine  21 mg Transdermal Daily  . pantoprazole  40 mg Oral Daily  . sacubitril-valsartan  1 tablet Oral BID  . sodium chloride  3 mL Intravenous Q12H  . spironolactone  25 mg Oral Daily     . sodium chloride  20 mL/hr at 10/25/14 2130  . sodium chloride Stopped (10/31/14 1000)    Assessment/Plan:  Ischemic DCM:  See note by Dr Jeffie Pollock.  On coreg and entresto  Will need to be titrated in CHF clinic.  Low EF complicated by significant smoking and COPD Continue lasix and aldactone  Inpatient rehab appropriate  Cr stable good output 1.2 L  Jenkins Rouge 11/02/2014, 12:45 PM

## 2014-11-02 NOTE — Progress Notes (Signed)
10 Days Post-Op Procedure(s) (LRB):   CORONARY ARTERY BYPASS GRAFTING (CABG)X3 LIMA-LAD;SVG to OM,SVG to Distal RCA (N/A) TRANSESOPHAGEAL ECHOCARDIOGRAM (TEE) (N/A) Subjective: Still has incisional pain  Objective: Vital signs in last 24 hours: Temp:  [97.6 F (36.4 C)-98.1 F (36.7 C)] 97.6 F (36.4 C) (07/30 0000) Pulse Rate:  [85-99] 96 (07/30 0600) Cardiac Rhythm:  [-] Normal sinus rhythm (07/30 0000) Resp:  [13-23] 15 (07/30 0700) BP: (110-151)/(59-97) 142/72 mmHg (07/30 0700) SpO2:  [91 %-100 %] 97 % (07/30 0600) Weight:  [128 lb 15.5 oz (58.5 kg)] 128 lb 15.5 oz (58.5 kg) (07/30 0500)  Hemodynamic parameters for last 24 hours:    Intake/Output from previous day: 07/29 0701 - 07/30 0700 In: 360 [P.O.:360] Out: 2500 [Urine:2500] Intake/Output this shift:    Alert Neuro unchanged Cardiac: RRR Lungs, diminished throughout, no wheezing No edema Wound clean and dry  Lab Results:  Recent Labs  10/31/14 0231 11/01/14 0219  WBC 12.2* 11.0*  HGB 9.1* 9.1*  HCT 28.4* 27.7*  PLT 470* 477*   BMET:  Recent Labs  10/31/14 0231 11/01/14 0219  NA 137 136  K 3.7 4.3  CL 91* 95*  CO2 38* 35*  GLUCOSE 95 117*  BUN 16 17  CREATININE 1.06 1.22  CALCIUM 8.6* 8.6*    PT/INR: No results for input(s): LABPROT, INR in the last 72 hours. ABG    Component Value Date/Time   PHART 7.342* 10/24/2014 0050   HCO3 23.4 10/24/2014 0050   TCO2 26 10/24/2014 2126   ACIDBASEDEF 2.0 10/24/2014 0050   O2SAT 59.3 10/26/2014 1050   CBG (last 3)   Recent Labs  11/01/14 1228 11/01/14 1720 11/01/14 2203  GLUCAP 130* 138* 147*    Assessment/Plan: S/P Procedure(s) (LRB):   CORONARY ARTERY BYPASS GRAFTING (CABG)X3 LIMA-LAD;SVG to OM,SVG to Distal RCA (N/A) TRANSESOPHAGEAL ECHOCARDIOGRAM (TEE) (N/A) -  CV- s/p CABG, low EF, systolic heart failure  Stable  RESP- severe COPD- respiratory status improved after bilateral thoracentesis  RENAL_ no issues, recheck labs in  AM  ENDO- CBG well controlled  Deconditioning- plan is inpatient rehab on Monday if remains stable   LOS: 21 days    Melrose Nakayama 11/02/2014

## 2014-11-02 NOTE — Progress Notes (Signed)
Peripherally Inserted Central Catheter/Midline Placement  The IV Nurse has discussed with the patient and/or persons authorized to consent for the patient, the purpose of this procedure and the potential benefits and risks involved with this procedure.  The benefits include less needle sticks, lab draws from the catheter and patient may be discharged home with the catheter.  Risks include, but not limited to, infection, bleeding, blood clot (thrombus formation), and puncture of an artery; nerve damage and irregular heat beat.  Alternatives to this procedure were also discussed.  PICC/Midline Placement Documentation  PICC / Midline Double Lumen 97/58/83 PICC Right Basilic 40 cm 0 cm (Active)  Indication for Insertion or Continuance of Line Prolonged intravenous therapies;Poor Vasculature-patient has had multiple peripheral attempts or PIVs lasting less than 24 hours 11/02/2014  6:26 PM  Exposed Catheter (cm) 0 cm 11/02/2014  6:26 PM  Site Assessment Clean;Dry;Intact 11/02/2014  6:26 PM  Lumen #1 Status Flushed;Saline locked;Blood return noted 11/02/2014  6:26 PM  Lumen #2 Status Flushed;Saline locked;Blood return noted 11/02/2014  6:26 PM  Dressing Type Transparent 11/02/2014  6:26 PM  Dressing Status Clean;Dry;Intact;Antimicrobial disc in place 11/02/2014  6:26 PM  Line Care Connections checked and tightened 11/02/2014  6:26 PM  Line Adjustment (NICU/IV Team Only) No 11/02/2014  6:26 PM  Dressing Intervention New dressing 11/02/2014  6:26 PM  Dressing Change Due 11/09/14 11/02/2014  6:26 PM       Rolena Infante 11/02/2014, 6:28 PM

## 2014-11-03 ENCOUNTER — Inpatient Hospital Stay (HOSPITAL_COMMUNITY): Payer: PRIVATE HEALTH INSURANCE

## 2014-11-03 LAB — BASIC METABOLIC PANEL
ANION GAP: 6 (ref 5–15)
BUN: 18 mg/dL (ref 6–20)
CO2: 35 mmol/L — AB (ref 22–32)
Calcium: 8.9 mg/dL (ref 8.9–10.3)
Chloride: 95 mmol/L — ABNORMAL LOW (ref 101–111)
Creatinine, Ser: 1.05 mg/dL (ref 0.61–1.24)
GFR calc Af Amer: >60 mL/min (ref >60)
GFR calc non Af Amer: >60 mL/min (ref >60)
Glucose, Bld: 99 mg/dL (ref 65–99)
Potassium: 4.6 mmol/L (ref 3.5–5.1)
SODIUM: 136 mmol/L (ref 135–145)

## 2014-11-03 LAB — GLUCOSE, CAPILLARY
Glucose-Capillary: 115 mg/dL — ABNORMAL HIGH (ref 65–99)
Glucose-Capillary: 149 mg/dL — ABNORMAL HIGH (ref 65–99)
Glucose-Capillary: 110 mg/dL — ABNORMAL HIGH (ref 65–99)
Glucose-Capillary: 116 mg/dL — ABNORMAL HIGH (ref 65–99)
Glucose-Capillary: 126 mg/dL — ABNORMAL HIGH (ref 65–99)

## 2014-11-03 LAB — CBC
HCT: 32.9 % — ABNORMAL LOW (ref 39.0–52.0)
Hemoglobin: 10.4 g/dL — ABNORMAL LOW (ref 13.0–17.0)
MCH: 34.3 pg — ABNORMAL HIGH (ref 26.0–34.0)
MCHC: 31.6 g/dL (ref 30.0–36.0)
MCV: 108.6 fL — AB (ref 78.0–100.0)
PLATELETS: 500 10*3/uL — AB (ref 150–400)
RBC: 3.03 MIL/uL — ABNORMAL LOW (ref 4.22–5.81)
RDW: 14.7 % (ref 11.5–15.5)
WBC: 10.7 10*3/uL — AB (ref 4.0–10.5)

## 2014-11-03 LAB — BRAIN NATRIURETIC PEPTIDE: B NATRIURETIC PEPTIDE 5: 699.7 pg/mL — AB (ref 0.0–100.0)

## 2014-11-03 LAB — CARBOXYHEMOGLOBIN
CARBOXYHEMOGLOBIN: 1.9 % — AB (ref 0.5–1.5)
METHEMOGLOBIN: 0.8 % (ref 0.0–1.5)
O2 Saturation: 54 %
Total hemoglobin: 10.6 g/dL — ABNORMAL LOW (ref 13.5–18.0)

## 2014-11-03 LAB — ANA, BODY FLUID: Anti-Nuclear Ab, IgG: NOT DETECTED

## 2014-11-03 NOTE — Progress Notes (Signed)
11 Days Post-Op Procedure(s) (LRB):   CORONARY ARTERY BYPASS GRAFTING (CABG)X3 LIMA-LAD;SVG to OM,SVG to Distal RCA (N/A) TRANSESOPHAGEAL ECHOCARDIOGRAM (TEE) (N/A) Subjective: Still complains of 7-8/ 10 incisional pain, has not changed since surgery Denies shortness of breath  Objective: Vital signs in last 24 hours: Temp:  [97.3 F (36.3 C)-98.1 F (36.7 C)] 98.1 F (36.7 C) (07/30 2000) Pulse Rate:  [95-111] 101 (07/31 0700) Cardiac Rhythm:  [-] Sinus tachycardia (07/30 2000) Resp:  [16-28] 18 (07/31 0700) BP: (121-155)/(66-87) 155/75 mmHg (07/31 0600) SpO2:  [87 %-100 %] 99 % (07/31 0700) Weight:  [124 lb 1.9 oz (56.3 kg)] 124 lb 1.9 oz (56.3 kg) (07/31 0600)  Hemodynamic parameters for last 24 hours: CVP:  [3 mmHg-4 mmHg] 3 mmHg  Intake/Output from previous day: 07/30 0701 - 07/31 0700 In: 1470 [P.O.:1440; I.V.:30] Out: 2750 [Urine:2750] Intake/Output this shift:    General appearance: cooperative and no distress Heart: regular rate and rhythm Lungs: diminished breath sounds bibasilar Wound: clean and dry  Lab Results:  Recent Labs  11/01/14 0219 11/03/14 0358  WBC 11.0* 10.7*  HGB 9.1* 10.4*  HCT 27.7* 32.9*  PLT 477* 500*   BMET:  Recent Labs  11/01/14 0219 11/03/14 0358  NA 136 136  K 4.3 4.6  CL 95* 95*  CO2 35* 35*  GLUCOSE 117* 99  BUN 17 18  CREATININE 1.22 1.05  CALCIUM 8.6* 8.9    PT/INR: No results for input(s): LABPROT, INR in the last 72 hours. ABG    Component Value Date/Time   PHART 7.342* 10/24/2014 0050   HCO3 23.4 10/24/2014 0050   TCO2 26 10/24/2014 2126   ACIDBASEDEF 2.0 10/24/2014 0050   O2SAT 54.0 11/03/2014 0350   CBG (last 3)   Recent Labs  11/02/14 1211 11/02/14 1612 11/02/14 2142  GLUCAP 136* 133* 102*    Assessment/Plan: S/P Procedure(s) (LRB):   CORONARY ARTERY BYPASS GRAFTING (CABG)X3 LIMA-LAD;SVG to OM,SVG to Distal RCA (N/A) TRANSESOPHAGEAL ECHOCARDIOGRAM (TEE) (N/A) -  CV - maintaining SR  Co-ox  is 54  On ASA, lipitor, coreg, entresto, aldactone  RESP- severe COPD- on brovana, pulmicort, xopenex  CXR shows some reaccumulation of pleural effusions  nicoderm patches for nicotine addiction  RENAL- creatinine OK, mild metabolic alkalosis, K Ok  Continue BID Lasix  Deconditioning- severe, PT/OT  Hopefully can go to inpatient rehab tomorrow  ENDO- CBG well controlled  DVT prophylaxis- on enoxaparin   LOS: 22 days    Melrose Nakayama 11/03/2014

## 2014-11-04 ENCOUNTER — Inpatient Hospital Stay (HOSPITAL_COMMUNITY)
Admission: RE | Admit: 2014-11-04 | Discharge: 2014-11-26 | DRG: 947 | Disposition: A | Discharge status: Home Health | Payer: PRIVATE HEALTH INSURANCE | Source: Intra-hospital | Attending: Physical Medicine & Rehabilitation | Admitting: Physical Medicine & Rehabilitation

## 2014-11-04 DIAGNOSIS — Z79899 Other long term (current) drug therapy: Secondary | ICD-10-CM | POA: Diagnosis not present

## 2014-11-04 DIAGNOSIS — N400 Enlarged prostate without lower urinary tract symptoms: Secondary | ICD-10-CM | POA: Diagnosis not present

## 2014-11-04 DIAGNOSIS — R531 Weakness: Secondary | ICD-10-CM | POA: Diagnosis present

## 2014-11-04 DIAGNOSIS — F4320 Adjustment disorder, unspecified: Secondary | ICD-10-CM | POA: Diagnosis present

## 2014-11-04 DIAGNOSIS — Z888 Allergy status to other drugs, medicaments and biological substances status: Secondary | ICD-10-CM

## 2014-11-04 DIAGNOSIS — Z951 Presence of aortocoronary bypass graft: Secondary | ICD-10-CM

## 2014-11-04 DIAGNOSIS — D62 Acute posthemorrhagic anemia: Secondary | ICD-10-CM | POA: Diagnosis present

## 2014-11-04 DIAGNOSIS — I2510 Atherosclerotic heart disease of native coronary artery without angina pectoris: Secondary | ICD-10-CM | POA: Diagnosis present

## 2014-11-04 DIAGNOSIS — I5043 Acute on chronic combined systolic (congestive) and diastolic (congestive) heart failure: Secondary | ICD-10-CM | POA: Diagnosis present

## 2014-11-04 DIAGNOSIS — G4700 Insomnia, unspecified: Secondary | ICD-10-CM | POA: Diagnosis present

## 2014-11-04 DIAGNOSIS — F419 Anxiety disorder, unspecified: Secondary | ICD-10-CM | POA: Diagnosis present

## 2014-11-04 DIAGNOSIS — J449 Chronic obstructive pulmonary disease, unspecified: Secondary | ICD-10-CM | POA: Diagnosis present

## 2014-11-04 DIAGNOSIS — Z6820 Body mass index (BMI) 20.0-20.9, adult: Secondary | ICD-10-CM | POA: Diagnosis not present

## 2014-11-04 DIAGNOSIS — F17210 Nicotine dependence, cigarettes, uncomplicated: Secondary | ICD-10-CM | POA: Diagnosis present

## 2014-11-04 DIAGNOSIS — N390 Urinary tract infection, site not specified: Secondary | ICD-10-CM | POA: Diagnosis present

## 2014-11-04 DIAGNOSIS — I5022 Chronic systolic (congestive) heart failure: Secondary | ICD-10-CM | POA: Diagnosis not present

## 2014-11-04 DIAGNOSIS — I5041 Acute combined systolic (congestive) and diastolic (congestive) heart failure: Secondary | ICD-10-CM | POA: Diagnosis not present

## 2014-11-04 DIAGNOSIS — J438 Other emphysema: Secondary | ICD-10-CM | POA: Diagnosis not present

## 2014-11-04 DIAGNOSIS — J42 Unspecified chronic bronchitis: Secondary | ICD-10-CM | POA: Diagnosis not present

## 2014-11-04 DIAGNOSIS — R5381 Other malaise: Secondary | ICD-10-CM | POA: Diagnosis present

## 2014-11-04 DIAGNOSIS — E43 Unspecified severe protein-calorie malnutrition: Secondary | ICD-10-CM | POA: Diagnosis present

## 2014-11-04 DIAGNOSIS — I10 Essential (primary) hypertension: Secondary | ICD-10-CM | POA: Diagnosis present

## 2014-11-04 LAB — URINALYSIS, ROUTINE W REFLEX MICROSCOPIC
BILIRUBIN URINE: NEGATIVE
Glucose, UA: NEGATIVE mg/dL
Ketones, ur: NEGATIVE mg/dL
Leukocytes, UA: NEGATIVE
Nitrite: NEGATIVE
Protein, ur: 100 mg/dL — AB
SPECIFIC GRAVITY, URINE: 1.012 (ref 1.005–1.030)
UROBILINOGEN UA: 1 mg/dL (ref 0.0–1.0)
pH: 7.5 (ref 5.0–8.0)

## 2014-11-04 LAB — GLUCOSE, CAPILLARY
GLUCOSE-CAPILLARY: 151 mg/dL — AB (ref 65–99)
GLUCOSE-CAPILLARY: 159 mg/dL — AB (ref 65–99)
Glucose-Capillary: 123 mg/dL — ABNORMAL HIGH (ref 65–99)
Glucose-Capillary: 130 mg/dL — ABNORMAL HIGH (ref 65–99)

## 2014-11-04 LAB — BASIC METABOLIC PANEL
Anion gap: 8 (ref 5–15)
BUN: 23 mg/dL — AB (ref 6–20)
CALCIUM: 9.1 mg/dL (ref 8.9–10.3)
CHLORIDE: 92 mmol/L — AB (ref 101–111)
CO2: 34 mmol/L — AB (ref 22–32)
CREATININE: 1.13 mg/dL (ref 0.61–1.24)
GFR calc Af Amer: >60 mL/min (ref >60)
Glucose, Bld: 172 mg/dL — ABNORMAL HIGH (ref 65–99)
Potassium: 4.5 mmol/L (ref 3.5–5.1)
SODIUM: 134 mmol/L — AB (ref 135–145)

## 2014-11-04 LAB — CARBOXYHEMOGLOBIN
Carboxyhemoglobin: 2 % — ABNORMAL HIGH (ref 0.5–1.5)
Methemoglobin: 1 % (ref 0.0–1.5)
O2 Saturation: 62.2 %
Total hemoglobin: 10.7 g/dL — ABNORMAL LOW (ref 13.5–18.0)

## 2014-11-04 LAB — URINE MICROSCOPIC-ADD ON

## 2014-11-04 MED ORDER — FUROSEMIDE 40 MG PO TABS
40.0000 mg | ORAL_TABLET | Freq: Every day | ORAL | Status: DC
  Administered 2014-11-05: 40 mg via ORAL
  Filled 2014-11-04 (×2): qty 1

## 2014-11-04 MED ORDER — SACUBITRIL-VALSARTAN 49-51 MG PO TABS
1.0000 | ORAL_TABLET | Freq: Two times a day (BID) | ORAL | Status: DC
  Administered 2014-11-04 – 2014-11-13 (×18): 1 via ORAL
  Filled 2014-11-04 (×22): qty 1

## 2014-11-04 MED ORDER — HYDROCERIN EX CREA
TOPICAL_CREAM | Freq: Two times a day (BID) | CUTANEOUS | Status: DC
  Administered 2014-11-04 – 2014-11-23 (×36): via TOPICAL
  Administered 2014-11-24: 1 via TOPICAL
  Administered 2014-11-25 – 2014-11-26 (×3): via TOPICAL
  Filled 2014-11-04: qty 113

## 2014-11-04 MED ORDER — NICOTINE 21 MG/24HR TD PT24: 21.0000 mg | MEDICATED_PATCH | Freq: Every day | TRANSDERMAL | Status: DC

## 2014-11-04 MED ORDER — ARFORMOTEROL TARTRATE 15 MCG/2ML IN NEBU
15.0000 ug | INHALATION_SOLUTION | Freq: Two times a day (BID) | RESPIRATORY_TRACT | Status: DC
  Administered 2014-11-04 – 2014-11-26 (×39): 15 ug via RESPIRATORY_TRACT
  Filled 2014-11-04 (×49): qty 2

## 2014-11-04 MED ORDER — NICOTINE 21 MG/24HR TD PT24
21.0000 mg | MEDICATED_PATCH | Freq: Every day | TRANSDERMAL | Status: DC
  Administered 2014-11-05 – 2014-11-26 (×22): 21 mg via TRANSDERMAL
  Filled 2014-11-04 (×22): qty 1

## 2014-11-04 MED ORDER — PANTOPRAZOLE SODIUM 40 MG PO TBEC: 40.0000 mg | DELAYED_RELEASE_TABLET | Freq: Every day | ORAL | Status: DC

## 2014-11-04 MED ORDER — ASPIRIN 81 MG PO CHEW
324.0000 mg | CHEWABLE_TABLET | Freq: Every day | ORAL | Status: DC
  Administered 2014-11-06 – 2014-11-26 (×3): 324 mg
  Filled 2014-11-04 (×10): qty 4

## 2014-11-04 MED ORDER — ACETAMINOPHEN 325 MG PO TABS: 325.0000 mg | ORAL_TABLET | ORAL | Status: DC | PRN

## 2014-11-04 MED ORDER — MIRTAZAPINE 30 MG PO TABS: 30.0000 mg | ORAL_TABLET | Freq: Every day | ORAL | Status: DC

## 2014-11-04 MED ORDER — METHOCARBAMOL 500 MG PO TABS
500.0000 mg | ORAL_TABLET | Freq: Four times a day (QID) | ORAL | Status: DC | PRN
  Administered 2014-11-20 – 2014-11-26 (×18): 500 mg via ORAL
  Filled 2014-11-04 (×18): qty 1

## 2014-11-04 MED ORDER — SODIUM CHLORIDE 0.9 % IJ SOLN
10.0000 mL | Freq: Two times a day (BID) | INTRAMUSCULAR | Status: DC
Start: 2014-11-04 — End: 2014-11-26
  Administered 2014-11-05: 10 mL
  Administered 2014-11-05: 20 mL
  Administered 2014-11-23: 10 mL

## 2014-11-04 MED ORDER — ARFORMOTEROL TARTRATE 15 MCG/2ML IN NEBU: 15.0000 ug | INHALATION_SOLUTION | Freq: Two times a day (BID) | RESPIRATORY_TRACT | Status: DC

## 2014-11-04 MED ORDER — ALUM & MAG HYDROXIDE-SIMETH 200-200-20 MG/5ML PO SUSP: 30.0000 mL | ORAL | Status: DC | PRN

## 2014-11-04 MED ORDER — INSULIN ASPART 100 UNIT/ML ~~LOC~~ SOLN
0.0000 [IU] | Freq: Three times a day (TID) | SUBCUTANEOUS | Status: DC
  Administered 2014-11-04 – 2014-11-05 (×2): 1 [IU] via SUBCUTANEOUS
  Administered 2014-11-05: 2 [IU] via SUBCUTANEOUS
  Administered 2014-11-05 – 2014-11-08 (×4): 1 [IU] via SUBCUTANEOUS
  Administered 2014-11-09 – 2014-11-10 (×3): 2 [IU] via SUBCUTANEOUS
  Administered 2014-11-10 – 2014-11-11 (×3): 1 [IU] via SUBCUTANEOUS
  Administered 2014-11-12: 2 [IU] via SUBCUTANEOUS
  Administered 2014-11-13 – 2014-11-20 (×7): 1 [IU] via SUBCUTANEOUS

## 2014-11-04 MED ORDER — ALPRAZOLAM 0.5 MG PO TABS: 0.5000 mg | ORAL_TABLET | Freq: Three times a day (TID) | ORAL | Status: DC | PRN

## 2014-11-04 MED ORDER — PRO-STAT SUGAR FREE PO LIQD
30.0000 mL | Freq: Two times a day (BID) | ORAL | Status: DC
  Administered 2014-11-04 – 2014-11-26 (×44): 30 mL via ORAL
  Filled 2014-11-04 (×43): qty 30

## 2014-11-04 MED ORDER — TRAMADOL HCL 50 MG PO TABS: 50.0000 mg | ORAL_TABLET | ORAL | Status: DC | PRN

## 2014-11-04 MED ORDER — ATORVASTATIN CALCIUM 80 MG PO TABS: 80.0000 mg | ORAL_TABLET | Freq: Every day | ORAL | Status: DC

## 2014-11-04 MED ORDER — TRAZODONE HCL 50 MG PO TABS
25.0000 mg | ORAL_TABLET | Freq: Every evening | ORAL | Status: DC | PRN
Start: 2014-11-04 — End: 2014-11-08
  Administered 2014-11-04 – 2014-11-07 (×2): 50 mg via ORAL
  Filled 2014-11-04 (×2): qty 1

## 2014-11-04 MED ORDER — PANTOPRAZOLE SODIUM 40 MG PO TBEC
40.0000 mg | DELAYED_RELEASE_TABLET | Freq: Every day | ORAL | Status: DC
  Administered 2014-11-05 – 2014-11-26 (×22): 40 mg via ORAL
  Filled 2014-11-04 (×22): qty 1

## 2014-11-04 MED ORDER — ALPRAZOLAM 0.5 MG PO TABS
0.5000 mg | ORAL_TABLET | Freq: Three times a day (TID) | ORAL | Status: DC | PRN
  Administered 2014-11-04 – 2014-11-05 (×3): 0.5 mg via ORAL
  Filled 2014-11-04 (×3): qty 1

## 2014-11-04 MED ORDER — OXYCODONE HCL 5 MG PO TABS: 5.0000 mg | ORAL_TABLET | Freq: Four times a day (QID) | ORAL | Status: DC | PRN

## 2014-11-04 MED ORDER — LEVALBUTEROL HCL 0.63 MG/3ML IN NEBU: 0.6300 mg | INHALATION_SOLUTION | Freq: Four times a day (QID) | RESPIRATORY_TRACT | Status: DC | PRN

## 2014-11-04 MED ORDER — FUROSEMIDE 40 MG PO TABS: 40.0000 mg | ORAL_TABLET | Freq: Two times a day (BID) | ORAL | Status: DC

## 2014-11-04 MED ORDER — SPIRONOLACTONE 25 MG PO TABS
25.0000 mg | ORAL_TABLET | Freq: Every day | ORAL | Status: DC
  Administered 2014-11-05 – 2014-11-10 (×6): 25 mg via ORAL
  Filled 2014-11-04 (×6): qty 1

## 2014-11-04 MED ORDER — DIGOXIN 125 MCG PO TABS
0.1250 mg | ORAL_TABLET | Freq: Every day | ORAL | Status: DC
  Administered 2014-11-05 – 2014-11-26 (×22): 0.125 mg via ORAL
  Filled 2014-11-04 (×22): qty 1

## 2014-11-04 MED ORDER — BUDESONIDE 0.5 MG/2ML IN SUSP
0.5000 mg | Freq: Two times a day (BID) | RESPIRATORY_TRACT | Status: DC
  Administered 2014-11-04 – 2014-11-26 (×40): 0.5 mg via RESPIRATORY_TRACT
  Filled 2014-11-04 (×44): qty 2

## 2014-11-04 MED ORDER — SPIRONOLACTONE 25 MG PO TABS: 25.0000 mg | ORAL_TABLET | Freq: Every day | ORAL | Status: DC

## 2014-11-04 MED ORDER — CARVEDILOL 6.25 MG PO TABS
9.3750 mg | ORAL_TABLET | Freq: Two times a day (BID) | ORAL | Status: DC
  Filled 2014-11-04 (×2): qty 1

## 2014-11-04 MED ORDER — HYDROCERIN EX CREA
TOPICAL_CREAM | Freq: Every day | CUTANEOUS | Status: DC
  Filled 2014-11-04: qty 113

## 2014-11-04 MED ORDER — ASPIRIN EC 325 MG PO TBEC
325.0000 mg | DELAYED_RELEASE_TABLET | Freq: Every day | ORAL | Status: DC
  Administered 2014-11-05 – 2014-11-25 (×19): 325 mg via ORAL
  Filled 2014-11-04 (×21): qty 1

## 2014-11-04 MED ORDER — SACUBITRIL-VALSARTAN 49-51 MG PO TABS: 1.0000 | ORAL_TABLET | Freq: Two times a day (BID) | ORAL | Status: DC

## 2014-11-04 MED ORDER — ASPIRIN 325 MG PO TBEC: 325.0000 mg | DELAYED_RELEASE_TABLET | Freq: Every day | ORAL | Status: AC

## 2014-11-04 MED ORDER — SENNOSIDES-DOCUSATE SODIUM 8.6-50 MG PO TABS
2.0000 | ORAL_TABLET | Freq: Every day | ORAL | Status: DC
  Administered 2014-11-04 – 2014-11-25 (×22): 2 via ORAL
  Filled 2014-11-04 (×22): qty 2

## 2014-11-04 MED ORDER — ATORVASTATIN CALCIUM 80 MG PO TABS
80.0000 mg | ORAL_TABLET | Freq: Every day | ORAL | Status: DC
Start: 2014-11-04 — End: 2014-11-26
  Administered 2014-11-04 – 2014-11-25 (×22): 80 mg via ORAL
  Filled 2014-11-04 (×23): qty 1

## 2014-11-04 MED ORDER — ONDANSETRON HCL 4 MG PO TABS: 4.0000 mg | ORAL_TABLET | Freq: Four times a day (QID) | ORAL | Status: DC | PRN

## 2014-11-04 MED ORDER — ONDANSETRON HCL 4 MG/2ML IJ SOLN: 4.0000 mg | Freq: Four times a day (QID) | INTRAMUSCULAR | Status: DC | PRN

## 2014-11-04 MED ORDER — ENOXAPARIN SODIUM 40 MG/0.4ML ~~LOC~~ SOLN
40.0000 mg | SUBCUTANEOUS | Status: DC
  Administered 2014-11-04 – 2014-11-25 (×22): 40 mg via SUBCUTANEOUS
  Filled 2014-11-04 (×22): qty 0.4

## 2014-11-04 MED ORDER — BUDESONIDE 0.5 MG/2ML IN SUSP: 0.5000 mg | Freq: Two times a day (BID) | RESPIRATORY_TRACT | Status: DC

## 2014-11-04 MED ORDER — OXYCODONE HCL 5 MG PO TABS
5.0000 mg | ORAL_TABLET | ORAL | Status: DC | PRN
  Administered 2014-11-23 – 2014-11-24 (×3): 10 mg via ORAL
  Filled 2014-11-04 (×4): qty 2

## 2014-11-04 MED ORDER — BENZONATATE 100 MG PO CAPS: 100.0000 mg | ORAL_CAPSULE | Freq: Three times a day (TID) | ORAL | Status: DC | PRN

## 2014-11-04 MED ORDER — CARVEDILOL 6.25 MG PO TABS: 6.2500 mg | ORAL_TABLET | Freq: Two times a day (BID) | ORAL | Status: DC

## 2014-11-04 MED ORDER — FLEET ENEMA 7-19 GM/118ML RE ENEM: 1.0000 | ENEMA | Freq: Once | RECTAL | Status: AC | PRN

## 2014-11-04 MED ORDER — DIGOXIN 125 MCG PO TABS
0.1250 mg | ORAL_TABLET | Freq: Every day | ORAL | Status: DC
  Administered 2014-11-04: 0.125 mg via ORAL
  Filled 2014-11-04: qty 1

## 2014-11-04 MED ORDER — CARVEDILOL 6.25 MG PO TABS
9.3750 mg | ORAL_TABLET | Freq: Two times a day (BID) | ORAL | Status: DC
  Administered 2014-11-04 – 2014-11-17 (×27): 9.375 mg via ORAL
  Filled 2014-11-04 (×54): qty 1

## 2014-11-04 MED ORDER — DUTASTERIDE 0.5 MG PO CAPS
0.5000 mg | ORAL_CAPSULE | Freq: Every day | ORAL | Status: DC
  Administered 2014-11-05 – 2014-11-26 (×22): 0.5 mg via ORAL
  Filled 2014-11-04 (×26): qty 1

## 2014-11-04 MED ORDER — FUROSEMIDE 40 MG PO TABS: 40.0000 mg | ORAL_TABLET | Freq: Every day | ORAL | Status: DC

## 2014-11-04 MED ORDER — MIRTAZAPINE 15 MG PO TABS
45.0000 mg | ORAL_TABLET | Freq: Every day | ORAL | Status: DC
  Administered 2014-11-04 – 2014-11-25 (×22): 45 mg via ORAL
  Filled 2014-11-04 (×22): qty 3

## 2014-11-04 MED ORDER — GUAIFENESIN-DM 100-10 MG/5ML PO SYRP: 5.0000 mL | ORAL_SOLUTION | Freq: Four times a day (QID) | ORAL | Status: DC | PRN

## 2014-11-04 MED ORDER — BISACODYL 10 MG RE SUPP
10.0000 mg | Freq: Every day | RECTAL | Status: DC | PRN
  Administered 2014-11-09: 10 mg via RECTAL
  Filled 2014-11-04: qty 1

## 2014-11-04 MED ORDER — DOCUSATE SODIUM 100 MG PO CAPS: 200.0000 mg | ORAL_CAPSULE | Freq: Every day | ORAL | Status: DC

## 2014-11-04 MED ORDER — TRAMADOL HCL 50 MG PO TABS
50.0000 mg | ORAL_TABLET | ORAL | Status: DC | PRN
  Administered 2014-11-04: 50 mg via ORAL
  Administered 2014-11-05: 100 mg via ORAL
  Administered 2014-11-05: 50 mg via ORAL
  Administered 2014-11-05 – 2014-11-12 (×27): 100 mg via ORAL
  Administered 2014-11-13: 50 mg via ORAL
  Administered 2014-11-13 – 2014-11-24 (×44): 100 mg via ORAL
  Administered 2014-11-25 (×2): 50 mg via ORAL
  Administered 2014-11-25 – 2014-11-26 (×3): 100 mg via ORAL
  Filled 2014-11-04 (×26): qty 2
  Filled 2014-11-04: qty 1
  Filled 2014-11-04 (×54): qty 2

## 2014-11-04 NOTE — Progress Notes (Signed)
Report called to [facility]. Transferred to inpatient Rehab [phone] via bed. Portable O2 on. No changes

## 2014-11-04 NOTE — Progress Notes (Signed)
12 Days Post-Op Procedure(s) (LRB):   CORONARY ARTERY BYPASS GRAFTING (CABG)X3 LIMA-LAD;SVG to OM,SVG to Distal RCA (N/A) TRANSESOPHAGEAL ECHOCARDIOGRAM (TEE) (N/A) Subjective: Still complains of incisional pain Breathing is Ok  Objective: Vital signs in last 24 hours: Temp:  [96.8 F (36 C)-98.3 F (36.8 C)] 98.1 F (36.7 C) (08/01 0200) Pulse Rate:  [93-112] 112 (08/01 0700) Cardiac Rhythm:  [-] Sinus tachycardia (07/31 1600) Resp:  [13-27] 24 (08/01 0700) BP: (97-142)/(57-84) 124/72 mmHg (08/01 0700) SpO2:  [90 %-100 %] 96 % (08/01 0700) Weight:  [123 lb 14.4 oz (56.2 kg)] 123 lb 14.4 oz (56.2 kg) (08/01 0600)  Hemodynamic parameters for last 24 hours: CVP:  [3 mmHg] 3 mmHg  Intake/Output from previous day: 07/31 0701 - 08/01 0700 In: 800 [P.O.:800] Out: 3225 [Urine:3225] Intake/Output this shift:    General appearance: alert and no distress Neurologic: intact Heart: regular rate and rhythm Lungs: diminished breath sounds bilaterally Wound: clean and dry  Lab Results:  Recent Labs  11/03/14 0358  WBC 10.7*  HGB 10.4*  HCT 32.9*  PLT 500*   BMET:  Recent Labs  11/03/14 0358  NA 136  K 4.6  CL 95*  CO2 35*  GLUCOSE 99  BUN 18  CREATININE 1.05  CALCIUM 8.9    PT/INR: No results for input(s): LABPROT, INR in the last 72 hours. ABG    Component Value Date/Time   PHART 7.342* 10/24/2014 0050   HCO3 23.4 10/24/2014 0050   TCO2 26 10/24/2014 2126   ACIDBASEDEF 2.0 10/24/2014 0050   O2SAT 62.2 11/04/2014 0420   CBG (last 3)   Recent Labs  11/03/14 1540 11/03/14 1915 11/03/14 2113  GLUCAP 110* 116* 126*    Assessment/Plan: S/P Procedure(s) (LRB):   CORONARY ARTERY BYPASS GRAFTING (CABG)X3 LIMA-LAD;SVG to OM,SVG to Distal RCA (N/A) TRANSESOPHAGEAL ECHOCARDIOGRAM (TEE) (N/A) -  CV- stable, co-ox 62 this AM  BNP still elevated yesterday but trending down  Dc EPW  RESP- severe COPD, CXR yesterday showed some reaccumulation of pleural  effusions, continue nebs, diuresis  RENAL_ creatinine and lytes OK, still has a mild metabolic alkalosis, needs continued diuresis  ENDO- CBG OK  I think he is OK to go to inpatient rehab when bed available, will need close monitoring of his volume status   LOS: 23 days    Melrose Nakayama 11/04/2014

## 2014-11-04 NOTE — Progress Notes (Signed)
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   He has no prior cardiac history admitted with progressive dyspnea. Developed acute respiratory failure requiring intubation. New onset acute systolic heart failure with EF 20-25% Had LHC with 3 vessel CAD. Had CABG x3 10/23/14.   Post operative course complicated by respiratory distress, hypertension, pleural effusion, and deconditioning.  Over the weekend he continued on carvedilol 6.25 mg twice a day, entresto 49-51 mg twice a day, lasix 40 mg po twice a day, and. spiro 25 mg daily. Weight down another pound. BMET pending.   Denies SOB/Orthopnea.   Objective:   Weight Range:  Vital Signs:   Temp:  [97.5 F (36.4 C)-98.3 F (36.8 C)] 97.5 F (36.4 C) (08/01 0816) Pulse Rate:  [93-112] 103 (08/01 0800) Resp:  [13-27] 21 (08/01 0800) BP: (97-142)/(57-84) 125/81 mmHg (08/01 0800) SpO2:  [90 %-100 %] 93 % (08/01 0800) Weight:  [123 lb 14.4 oz (56.2 kg)] 123 lb 14.4 oz (56.2 kg) (08/01 0600) Last BM Date: 11/03/14  Weight change: Filed Weights   11/02/14 0500 11/03/14 0600 11/04/14 0600  Weight: 128 lb 15.5 oz (58.5 kg) 124 lb 1.9 oz (56.3 kg) 123 lb 14.4 oz (56.2 kg)    Intake/Output:   Intake/Output Summary (Last 24 hours) at 11/04/14 0903 Last data filed at 11/04/14 0800  Gross per 24 hour  Intake    560 ml  Output   3100 ml  Net  -2540 ml     Physical Exam: General: . No resp difficulty. Sitting in the chair eating breakfast HEENT: normal Neck: supple. JVP flat . Carotids 2+ bilat; no bruits. No lymphadenopathy or thryomegaly appreciated. Cor: PMI nondisplaced. Tachy Regular rate & rhythm. No rubs, gallops or murmurs. Sternal incision approximated. Lungs: clear Abdomen: soft, nontender, nondistended. No  hepatosplenomegaly. No bruits or masses. Good bowel sounds. Extremities: no cyanosis, clubbing, rash, edema. RUE PICC Neuro: alert & orientedx3, cranial nerves grossly intact. moves all 4 extremities w/o difficulty. Affect pleasant GU: Foley   Telemetry: Sinus Tach low 100s    Labs: Basic Metabolic Panel:  Recent Labs Lab 10/29/14 0252 10/30/14 0225 10/31/14 0231 11/01/14 0219 11/03/14 0358  NA 136 133* 137 136 136  K 5.0 4.8 3.7 4.3 4.6  CL 94* 92* 91* 95* 95*  CO2 36* 37* 38* 35* 35*  GLUCOSE 125* 155* 95 117* 99  BUN 19 21* 16 17 18   CREATININE 0.87 1.10 1.06 1.22 1.05  CALCIUM 9.0 8.7* 8.6* 8.6* 8.9    Liver Function Tests:  Recent Labs Lab 10/30/14 0225 10/30/14 1335  AST 15  --   ALT 22  --   ALKPHOS 66  --   BILITOT 0.6  --   PROT 4.8* 5.3*  ALBUMIN 2.0*  --    No results for input(s): LIPASE, AMYLASE in the last 168 hours. No results for input(s): AMMONIA in the last 168 hours.  CBC:  Recent Labs Lab 10/29/14 0252 10/30/14 0225 10/31/14 0231 11/01/14 0219 11/03/14 0358  WBC 12.6* 12.8* 12.2* 11.0* 10.7*  HGB 9.8* 9.2* 9.1* 9.1* 10.4*  HCT 30.9* 29.1* 28.4* 27.7* 32.9*  MCV 113.2* 113.2* 111.4* 110.8* 108.6*  PLT 375 425* 470* 477* 500*    Cardiac Enzymes: No results for input(s): CKTOTAL, CKMB, CKMBINDEX, TROPONINI in the last  168 hours.  BNP: BNP (last 3 results)  Recent Labs  10/12/14 1337 10/29/14 0812 11/03/14 0358  BNP 981.7* 1031.0* 699.7*    ProBNP (last 3 results) No results for input(s): PROBNP in the last 8760 hours.    Other results:  Imaging: Dg Chest Port 1 View  11/03/2014   CLINICAL DATA:  Pleural effusions, short of breath  EXAM: PORTABLE CHEST - 1 VIEW  COMPARISON:  Radiograph 11/01/2014  FINDINGS: Interval placement of a RIGHT PICC line with tip at the cavoatrial junction. Midline sternotomy the overlies large cardiac silhouette. There is bibasilar airspace disease suggesting pulmonary edema. There is  bilateral small effusions slightly increased.  IMPRESSION: 1. No interval change in basilar pulmonary edema pattern. 2. Slight increase in pleural effusions. 3. Introduction of RIGHT PICC line with tip at the cavoatrial junction.   Electronically Signed   By: Suzy Bouchard M.D.   On: 11/03/2014 07:53      Medications:     Scheduled Medications: . arformoterol  15 mcg Nebulization BID  . aspirin EC  325 mg Oral Daily   Or  . aspirin  324 mg Per Tube Daily  . atorvastatin  80 mg Oral q1800  . bisacodyl  10 mg Oral Daily   Or  . bisacodyl  10 mg Rectal Daily  . budesonide (PULMICORT) nebulizer solution  0.5 mg Nebulization BID  . carvedilol  6.25 mg Oral BID WC  . docusate sodium  200 mg Oral Daily  . dutasteride  0.5 mg Oral Daily  . enoxaparin (LOVENOX) injection  40 mg Subcutaneous QHS  . furosemide  40 mg Oral BID  . insulin aspart  0-9 Units Subcutaneous TID WC  . mirtazapine  30 mg Oral QHS  . nicotine  21 mg Transdermal Daily  . pantoprazole  40 mg Oral Daily  . sacubitril-valsartan  1 tablet Oral BID  . sodium chloride  10-40 mL Intracatheter Q12H  . spironolactone  25 mg Oral Daily     Infusions: . sodium chloride 20 mL/hr at 10/25/14 2130  . sodium chloride Stopped (10/31/14 1000)     PRN Medications:  sodium chloride, ALPRAZolam, benzonatate, gi cocktail, hydrocerin, levalbuterol, metoprolol, morphine injection, ondansetron (ZOFRAN) IV, oxyCODONE, sodium chloride, traMADol, zolpidem   Assessment:  1. Hypoxia- Acute Respiratory Failure- -> Short Term Intubation--on 2 liters [place] 2. ICM---> CABG x3 7/20 IMA-LAD;SVG to OM,SVG to Distal RCA (N/A). On statin, bb, aspirin  3. Acute Systolic Heart Failure- ECHO 10/12/14 EF 20% ICM per LHC  4. HTN-  5. L Pleural Effusion- S/P L thoracentesis 7/28  6. Deconditioning.--> PT following.  7. Severe protein-calorie malnutrition 8. Urinary Retention- Foley in place.    Plan/Discussion:    Slowly improving.  CO-OX today 61%. Volume status low. Cut back lasix to 40 mg daily and continue spironolactone 25 mg daily. Weight down another pound. Increase carvedilol 9.375 mg twice daily. Add 0.125 mg dig daily. Continue entresto 49/51 twice daily. Leave PICC for now for lab draws on rehab.   Renal function stable.   HF team will follow on CIR.  Length of Stay: 23   CLEGG,AMY NP-C  11/04/2014, 9:03 AM  Advanced Heart Failure Team Pager [phone] (M-F; 7a - 4p)  Please contact [facility] for night-coverage after hours (4p -7a ) and weekends on [email or web address]   Patient seen and examined with Darrick Grinder, NP. We discussed all aspects of the encounter. I agree with the assessment and plan as stated above.   Improving  slowly. Co-ox better. On good meds. BP ok. He remains quite debilitated. Ok to transfer to [facility] today. The HF team will continue to follow.   Bensimhon, Daniel,MD 9:46 AM

## 2014-11-04 NOTE — Progress Notes (Signed)
Meredith Staggers, MD Physician Signed Physical Medicine and Rehabilitation Consult Note 10/31/2014 8:54 AM  Related encounter: ED to Hosp-Admission (Discharged) from 10/11/2014 in [facility] 2S CVT SURGICAL ICU    Expand All Collapse All        Physical Medicine and Rehabilitation Consult  Reason for Consult: Deconditioning due to acute respiratory failure, CHF, and recent CABG.  Referring Physician: Dr. Roxan Hockey.    HPI: Travis Romero is a 64 y.o. male with history of HTN, COPD, tobacco abuse who was admitted on 10/11/14 with progressive SOB due to acute on chronic respiratory failure and was intubation at admission. He was found to have acute systolic CHF requiring aggressive diuresis and 2 D echo done revealing EF 20-25% with mild MR. 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 Post op extubated without difficulty and volume excess treated with diuresis. Foley placed due to urinary retention and he was started on IV antibiotics due ongoing hypoxia and concerns of COPD exacerbation/presumed PNA. He underwent thoracocentesis of 1.5 L of dark blood fluid on 07/26 and PCCM consulted for input. Aggressive pulmonary toilet, increase in activity as well as chest PT recommended. He underwent right thoracocentesis on 07/27 with improvement in respiratory status. Patient has had issues with confusion, processing deficits, poor safety awareness as well as left hand/wrist weakness. Therapy ongoing and CIR recommended due to severe deconditioning.    Review of Systems  Constitutional: Negative for fever.  Eyes: Negative for blurred vision.  Respiratory: Positive for cough and shortness of breath.  Cardiovascular: Positive for palpitations.  Gastrointestinal: Negative for heartburn.  Genitourinary: Negative for dysuria.  Musculoskeletal: Negative for myalgias.  Skin: Negative for  rash.  Neurological: Positive for focal weakness. Negative for headaches.  Endo/Heme/Allergies: Negative for polydipsia.      Past Medical History  Diagnosis Date  . Hypertension   . COPD (chronic obstructive pulmonary disease)   . BPH (benign prostatic hyperplasia)   . Back pain 2007    after fall down steps    Past Surgical History  Procedure Laterality Date  . Tonsillectomy    . Pyloromyotomy      pyloric stenosis  . Cardiac catheterization N/A 10/17/2014    Procedure: Right/Left Heart Cath and Coronary Angiography; Surgeon: Troy Sine, MD; Location: [facility]; Service: Cardiovascular; Laterality: N/A;  . Coronary artery bypass graft N/A 10/23/2014    Procedure: CORONARY ARTERY BYPASS GRAFTING (CABG)X3 LIMA-LAD;SVG to OM,SVG to Distal RCA; Surgeon: Melrose Nakayama, MD; Location: [facility]; Service: Open Heart Surgery; Laterality: N/A;  . Tee without cardioversion N/A 10/23/2014    Procedure: TRANSESOPHAGEAL ECHOCARDIOGRAM (TEE); Surgeon: Melrose Nakayama, MD; Location: [facility]; Service: Open Heart Surgery; Laterality: N/A;    Family History  Problem Relation Age of Onset  . Hypertension Mother   . Heart disease Father     Social History: Lives alone and independent PTA. reports that he has been smoking Cigarettes. He has smoked for the past 20 years. He has never used smokeless tobacco. He reports that he drinks about 1.2 - 1.8 oz of alcohol per week. He reports that he does not use illicit drugs.     Allergies  Allergen Reactions  . Advair Diskus [Fluticasone-Salmeterol] Other (See Comments)    Exacerbated BPH   . Flexeril [Cyclobenzaprine] Other (See Comments)    Caused shortness of breath and swelling in scrotum  Medications Prior to Admission  Medication Sig Dispense Refill  . ALPRAZolam (XANAX) 0.5 MG tablet TAKE 1 TABLET BY MOUTH  3 TIMES A DAY AS NEEDED FOR 90 DAYS  0  . benzonatate (TESSALON) 100 MG capsule Take 100-200 mg by mouth 3 (three) times daily as needed for cough.    . cloNIDine (CATAPRES) 0.2 MG tablet Take 0.2 mg by mouth 4 (four) times daily as needed.     . dutasteride (AVODART) 0.5 MG capsule Take 0.5 mg by mouth daily.  0  . Ipratropium-Albuterol (COMBIVENT IN) Inhale into the lungs.    . isradipine (DYNACIRC) 5 MG capsule Take 10 mg by mouth 2 (two) times daily.  1  . mirtazapine (REMERON) 45 MG tablet Take 45 mg by mouth at bedtime.    Marland Kitchen spironolactone (ALDACTONE) 50 MG tablet Take 75 mg by mouth daily.    Marland Kitchen telmisartan (MICARDIS) 80 MG tablet Take 80 mg by mouth daily.  1  . traMADol (ULTRAM) 50 MG tablet Take 1 tablet (50 mg total) by mouth every 8 (eight) hours as needed. 30 tablet 0  . cyclobenzaprine (FLEXERIL) 10 MG tablet Take 1 tablet (10 mg total) by mouth 3 (three) times daily as needed for muscle spasms. (Patient not taking: Reported on 10/11/2014) 30 tablet 0    Home: Home Living Family/patient expects to be discharged to:: Skilled nursing facility Living Arrangements: Alone  Functional History: Prior Function Level of Independence: Independent Comments: was working Functional Status:  Mobility: Bed Mobility Overal bed mobility: Needs Assistance, +2 for physical assistance Bed Mobility: Supine to Sit, Sit to Supine Rolling: Total assist, +2 for physical assistance Supine to sit: Mod assist, +2 for safety/equipment, HOB elevated Sit to supine: +2 for physical assistance, Max assist General bed mobility comments: HOB elevated on arrival; due to respiratory status, chose not to lie flat and pivoted to EOB from supine;  Transfers Overall transfer level: Needs assistance Equipment used: Rolling walker (2 wheeled) Transfers: Sit to/from Stand, Stand Pivot Transfers Sit to Stand: +2 physical assistance, Mod assist, +2  safety/equipment Stand pivot transfers: +2 physical assistance, Max assist Squat pivot transfers: Max assist General transfer comment: Initiates with max cues, initially min assist for anterior translation, however as begins to extend hips and knees immediately begins retropulsion and does not sense he is leaning/pushing backwards; x 2 Ambulation/Gait Ambulation/Gait assistance: Max assist, +2 physical assistance, +2 safety/equipment Ambulation Distance (Feet): 2 Feet Assistive device: Rolling walker (2 wheeled) Gait Pattern/deviations: Shuffle (retroopulsion) General Gait Details: retropulsion requires 2 person assist and constant cues to work towards upright/vertical over his BOS    ADL: ADL Overall ADL's : Needs assistance/impaired Eating/Feeding: Set up, Bed level Grooming: Wash/dry hands, Wash/dry face, Set up, Bed level Grooming Details (indicate cue type and reason): decreased thoroughness, needs cues for task persistence Upper Body Bathing: Moderate assistance, Sitting Lower Body Bathing: Total assistance, Bed level Upper Body Dressing : Minimal assistance, Sitting Lower Body Dressing: Total assistance, Bed level General ADL Comments: decreased thoroughness and task persistence  Cognition: Cognition Overall Cognitive Status: No family/caregiver present to determine baseline cognitive functioning Orientation Level: Oriented X4 Cognition Arousal/Alertness: Awake/alert Behavior During Therapy: Anxious Overall Cognitive Status: No family/caregiver present to determine baseline cognitive functioning Area of Impairment: Following commands, Problem solving, Attention, Memory, Awareness Current Attention Level: Sustained Memory: Decreased short-term memory Following Commands: Follows one step commands with increased time Safety/Judgement: Decreased awareness of safety, Decreased awareness of deficits Awareness: Intellectual Problem Solving: Slow processing, Difficulty  sequencing,  Requires verbal cues, Requires tactile cues General Comments: unable to state sternal precautions   Blood pressure 148/71, pulse 95, temperature 98.1 F (36.7 C), temperature source Oral, resp. rate 19, height 5\' 7"  (1.702 m), weight 59 kg (130 lb 1.1 oz), SpO2 94 %. Physical Exam  Constitutional:  Fatigued appearing  HENT:  Head: Normocephalic and atraumatic.  Right Ear: External ear normal.  Left Ear: External ear normal.  Eyes: Conjunctivae are normal. Pupils are equal, round, and reactive to light.  Neck: No JVD present. No tracheal deviation present. No thyromegaly present.  Cardiovascular:  No murmur heard. tachycardic  Respiratory: No respiratory distress. He has no wheezes. He has no rales.  GI: He exhibits no distension. There is no tenderness.  Musculoskeletal: He exhibits no edema.  Lymphadenopathy:   He has no cervical adenopathy.  Neurological:  UE: 3+ deltoid, 4- bicep, tricep, wrist, hi, LE: 3 hf, 3+ ke and 4- adf/apf  Skin:  Chronic vascular changes either leg  Psychiatric:  Flat but cooperative     Lab Results Last 24 Hours    Results for orders placed or performed during the hospital encounter of 10/11/14 (from the past 24 hour(s))  Lactate dehydrogenase (CSF, pleural or peritoneal fluid) Status: Abnormal   Collection Time: 10/30/14 11:30 AM  Result Value Ref Range   LD, Fluid 174 (H) 3 - 23 U/L   Fluid Type-FLDH Pleural R   Protein, pleural or peritoneal fluid Status: None   Collection Time: 10/30/14 11:30 AM  Result Value Ref Range   Total protein, fluid <3.0 g/dL   Fluid Type-FTP Pleural R   Body fluid cell count with differential Status: Abnormal   Collection Time: 10/30/14 11:30 AM  Result Value Ref Range   Fluid Type-FCT Pleural R    Color, Fluid YELLOW (A) YELLOW   Appearance, Fluid BLOODY (A) CLEAR   WBC, Fluid 758 0 - 1000 cu mm   Neutrophil Count, Fluid 13 0  - 25 %   Lymphs, Fluid 47 %   Monocyte-Macrophage-Serous Fluid 40 (L) 50 - 90 %   Other Cells, Fluid MESOTHELIAL CELLS %  Body fluid culture Status: None (Preliminary result)   Collection Time: 10/30/14 11:30 AM  Result Value Ref Range   Specimen Description FLUID PLEURAL RIGHT    Special Requests NONE    Gram Stain      FEW WBC PRESENT, PREDOMINANTLY PMN NO ORGANISMS SEEN    Culture PENDING    Report Status PENDING   Rheumatoid factors, fluid Status: None   Collection Time: 10/30/14 11:30 AM  Result Value Ref Range   Rheumatoid Arthritis, Qn/Fluid Negative Neg:<1:10  Glucose, capillary Status: Abnormal   Collection Time: 10/30/14 12:41 PM  Result Value Ref Range   Glucose-Capillary 126 (H) 65 - 99 mg/dL   Comment 1 Capillary Specimen    Comment 2 Notify RN   Lactate dehydrogenase Status: Abnormal   Collection Time: 10/30/14 1:35 PM  Result Value Ref Range   LDH 212 (H) 98 - 192 U/L  Protein, total Status: Abnormal   Collection Time: 10/30/14 1:35 PM  Result Value Ref Range   Total Protein 5.3 (L) 6.5 - 8.1 g/dL  Cholesterol, total Status: None   Collection Time: 10/30/14 1:35 PM  Result Value Ref Range   Cholesterol 101 0 - 200 mg/dL  Glucose, capillary Status: Abnormal   Collection Time: 10/30/14 5:12 PM  Result Value Ref Range   Glucose-Capillary 137 (H) 65 - 99 mg/dL   Comment 1  Capillary Specimen    Comment 2 Notify RN   Glucose, capillary Status: Abnormal   Collection Time: 10/30/14 9:53 PM  Result Value Ref Range   Glucose-Capillary 167 (H) 65 - 99 mg/dL  Procalcitonin Status: None   Collection Time: 10/31/14 2:31 AM  Result Value Ref Range   Procalcitonin <0.10 ng/mL  CBC Status: Abnormal   Collection Time: 10/31/14 2:31 AM  Result Value Ref Range   WBC 12.2 (H)  4.0 - 10.5 K/uL   RBC 2.55 (L) 4.22 - 5.81 MIL/uL   Hemoglobin 9.1 (L) 13.0 - 17.0 g/dL   HCT 28.4 (L) 39.0 - 52.0 %   MCV 111.4 (H) 78.0 - 100.0 fL   MCH 35.7 (H) 26.0 - 34.0 pg   MCHC 32.0 30.0 - 36.0 g/dL   RDW 14.5 11.5 - 15.5 %   Platelets 470 (H) 150 - 400 K/uL  Basic metabolic panel Status: Abnormal   Collection Time: 10/31/14 2:31 AM  Result Value Ref Range   Sodium 137 135 - 145 mmol/L   Potassium 3.7 3.5 - 5.1 mmol/L   Chloride 91 (L) 101 - 111 mmol/L   CO2 38 (H) 22 - 32 mmol/L   Glucose, Bld 95 65 - 99 mg/dL   BUN 16 6 - 20 mg/dL   Creatinine, Ser 1.06 0.61 - 1.24 mg/dL   Calcium 8.6 (L) 8.9 - 10.3 mg/dL   GFR calc non Af Amer >60 >60 mL/min   GFR calc Af Amer >60 >60 mL/min   Anion gap 8 5 - 15      Imaging Results (Last 48 hours)    Dg Chest 1 View  10/29/2014 CLINICAL DATA: Thoracentesis. Evaluate for pneumothorax. EXAM: CHEST 1 VIEW COMPARISON: Prior chest radiographs dating back to 10/12/2014. FINDINGS: Interval decrease in the LEFT pleural effusion associated with LEFT thoracentesis. RIGHT pleural effusion appears unchanged. Basilar predominant atelectasis. No pneumothorax is present. Median sternotomy/CABG. Cardiomegaly. Monitoring leads project over the chest. IMPRESSION: Uncomplicated LEFT thoracentesis with decrease LEFT pleural effusion. Exam otherwise unchanged. Electronically Signed By: Dereck Ligas M.D. On: 10/29/2014 10:32   Dg Chest Port 1 View  10/31/2014 CLINICAL DATA: Status post coronary bypass grafting EXAM: PORTABLE CHEST - 1 VIEW COMPARISON: 10/30/2014 FINDINGS: Cardiac shadow is again mildly enlarged. Postsurgical changes are again seen. Aortic calcifications are noted. No significant right-sided pleural effusion is seen. Emphysematous changes are noted in the right apex. Mild persistent left retrocardiac atelectasis is seen. A small left  pleural effusion is noted. IMPRESSION: Persistent left retrocardiac density. Small left pleural effusion. Electronically Signed By: Inez Catalina M.D. On: 10/31/2014 07:51   Dg Chest Port 1 View  10/30/2014 CLINICAL DATA: Pleural effusion. EXAM: PORTABLE CHEST - 1 VIEW COMPARISON: Same day. FINDINGS: Stable cardiomegaly. Status post coronary artery bypass graft. Emphysematous disease is noted in the right upper lobe. No pneumothorax is noted. Right pleural effusion noted on prior exam is significantly smaller. Mild right basilar subsegmental atelectasis is noted. Stable left basilar opacity is noted concerning for edema or atelectasis with possible associated effusion. Bony thorax is intact. IMPRESSION: No definite pneumothorax is noted. Right pleural effusion is significantly smaller status post thoracentesis. Emphysematous changes noted in right upper lobe. Mild right basilar subsegmental atelectasis is noted. Left basilar opacity is noted concerning for edema or atelectasis with associated pleural effusion. Electronically Signed By: Marijo Conception, M.D. On: 10/30/2014 12:32   Dg Chest Port 1 View  10/30/2014 CLINICAL DATA: Status post CABG on July 20th EXAM: PORTABLE CHEST -  1 VIEW COMPARISON: Portable chest x-ray of October 29, 2014 FINDINGS: The lungs are slightly less well inflated today. There is persistent bibasilar atelectasis. The left costophrenic angle is excluded from the study. There is no pneumothorax. The cardiac silhouette is mildly enlarged but stable. The pulmonary vascularity is not clearly engorged. The sternal wires are intact. The bony thorax exhibits no acute abnormality. IMPRESSION: Inflation is decreased today. There is persistent bibasilar atelectasis. No significant re-accumulation of the left pleural effusion is observed today. Electronically Signed By: David Martinique M.D. On: 10/30/2014 07:39   US Thoracentesis Asp Pleural Space W/img  Guide  10/29/2014 CLINICAL DATA: Left pleural effusion, status post coronary artery bypass graft x 3 vessels EXAM: ULTRASOUND GUIDED LEFT THORACENTESIS COMPARISON: None. PROCEDURE: An ultrasound guided thoracentesis was thoroughly discussed with the patient and questions answered. The benefits, risks, alternatives and complications were also discussed. The patient understands and wishes to proceed with the procedure. Written consent was obtained. Ultrasound was performed to localize and mark an adequate pocket of fluid in the left chest. The area was then prepped and draped in the normal sterile fashion. 1% Lidocaine was used for local anesthesia. Under ultrasound guidance a Safe T Centesis catheter was introduced. Thoracentesis was performed. The catheter was removed and a dressing applied. COMPLICATIONS: None immediate. FINDINGS: A total of approximately 1.5 liters of dark bloody fluid was removed. A fluid sample was notsent for laboratory analysis. IMPRESSION: Successful ultrasound guided left thoracentesis yielding 1.5 liters of pleural fluid. Read by: Gareth Eagle, PA-C Electronically Signed By: Sandi Mariscal M.D. On: 10/29/2014 13:20     Assessment/Plan: Diagnosis: debility after respiratory failure, CHF exacerbation 1. Does the need for close, 24 hr/day medical supervision in concert with the patient's rehab needs make it unreasonable for this patient to be served in a less intensive setting? Yes 2. Co-Morbidities requiring supervision/potential complications: hx MI/CAD, pvd 3. Due to bladder management, bowel management, safety, skin/wound care, disease management, medication administration, pain management and patient education, does the patient require 24 hr/day rehab nursing? Yes 4. Does the patient require coordinated care of a physician, rehab nurse, PT (1-2 hrs/day, 5 days/week) and OT (1-2 hrs/day, 5 days/week) to address physical and functional deficits in the context of the  above medical diagnosis(es)? Yes Addressing deficits in the following areas: balance, endurance, locomotion, strength, transferring, bowel/bladder control, bathing, dressing, feeding, grooming, toileting and psychosocial support 5. Can the patient actively participate in an intensive therapy program of at least 3 hrs of therapy per day at least 5 days per week? Yes 6. The potential for patient to make measurable gains while on inpatient rehab is excellent 7. Anticipated functional outcomes upon discharge from inpatient rehab are modified independent and supervision with PT, modified independent and supervision with OT, n/a with SLP. 8. Estimated rehab length of stay to reach the above functional goals is: 13-18 days 9. Does the patient have adequate social supports and living environment to accommodate these discharge functional goals? Potentially 10. Anticipated D/C setting: Home 11. Anticipated post D/C treatments: HH therapy and Outpatient therapy 12. Overall Rehab/Functional Prognosis: good  RECOMMENDATIONS: This patient's condition is appropriate for continued rehabilitative care in the following setting: CIR Patient has agreed to participate in recommended program. Yes Note that insurance prior authorization may be required for reimbursement for recommended care.  Comment: Need to follow up on social supports.   Meredith Staggers, MD, [facility] 10/31/2014     10/31/2014  Revision History     Date/Time User Provider Type Action   10/31/2014 9:44 AM Meredith Staggers, MD Physician Sign   10/31/2014 9:11 AM Bary Leriche, PA-C Physician Assistant Pend   View Details Report       Routing History     Date/Time From To Method   10/31/2014 9:44 AM Meredith Staggers, MD Meredith Staggers, MD In Basket   10/31/2014 9:44 AM Meredith Staggers, MD Orpah Melter, MD Fax

## 2014-11-04 NOTE — Progress Notes (Signed)
Pt transferred to Rehab from 2S20. Alert and orientated x3. Orientated to rehab expectations regarding therapy schedule, visiting hours, etc. Diagnostic specific information provided. Pt to begin rehab in the morning.

## 2014-11-04 NOTE — H&P (Signed)
Physical Medicine and Rehabilitation Admission H&P   Chief Complaint  Patient presents with  . Debility and encephalopathy after acute respiratory failure with CHF and severe CAD requiring CABG.    HPI: Travis Romero is a 64 y.o. male with history of HTN, COPD, tobacco abuse who was admitted on 10/11/14 with progressive SOB due to acute on chronic respiratory failure and was intubation at admission. He was found to have acute systolic CHF requiring aggressive diuresis and 2 D echo done revealing EF 20-25% with mild MR. 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 Post op extubated without difficulty and volume excess treated with diuresis. Foley placed due to urinary retention and he was started on IV antibiotics due ongoing hypoxia and concerns of COPD exacerbation/presumed PNA. He underwent thoracocentesis of 1.5 L of dark blood fluid on 07/26 and PCCM consulted for input. Aggressive pulmonary toilet, increase in activity as well as chest PT recommended. He underwent right thoracocentesis on 07/27 with improvement in respiratory status. Dr. Haroldine Laws was consulted for input on heart failure management and recommended entrestro bid with aggressive diuresis and Co-ox monitoring. Digoxin added today and coreg titrated upwards. Therapy ongoing and patient noted to have difficulty with processing and sequencing, poor safety awareness as well as worsening of left hand/wrist weakness. OT working on ROM and strengthening exercise for LUE weakness. Patient continues to be limited by sternal precautions, LUE weakness as well as deconditioning therefore CIR recommended for follow up therapy.    Review of Systems  HENT: Negative for hearing loss.  Eyes: Negative for blurred vision and double vision.  Respiratory: Positive for shortness of breath (acute on chronic but improving. ). Negative for  cough.  Cardiovascular: Positive for chest pain (chesst wall pain). Negative for palpitations and leg swelling.  Gastrointestinal: Positive for constipation. Negative for nausea, vomiting and abdominal pain.  Genitourinary:   Foley in place  Musculoskeletal: Positive for myalgias. Negative for back pain and joint pain.  Neurological: Positive for focal weakness (left hand) and weakness. Negative for dizziness and headaches.  Psychiatric/Behavioral: Positive for depression. The patient is nervous/anxious and has insomnia.      Past Medical History  Diagnosis Date  . Hypertension   . COPD (chronic obstructive pulmonary disease)   . BPH (benign prostatic hyperplasia)   . Back pain 2007    after fall down steps    Past Surgical History  Procedure Laterality Date  . Tonsillectomy    . Pyloromyotomy      pyloric stenosis  . Cardiac catheterization N/A 10/17/2014    Procedure: Right/Left Heart Cath and Coronary Angiography; Surgeon: Troy Sine, MD; Location: [facility]; Service: Cardiovascular; Laterality: N/A;  . Coronary artery bypass graft N/A 10/23/2014    Procedure: CORONARY ARTERY BYPASS GRAFTING (CABG)X3 LIMA-LAD;SVG to OM,SVG to Distal RCA; Surgeon: Melrose Nakayama, MD; Location: [facility]; Service: Open Heart Surgery; Laterality: N/A;  . Tee without cardioversion N/A 10/23/2014    Procedure: TRANSESOPHAGEAL ECHOCARDIOGRAM (TEE); Surgeon: Melrose Nakayama, MD; Location: [facility]; Service: Open Heart Surgery; Laterality: N/A;    Family History  Problem Relation Age of Onset  . Hypertension Mother   . Heart disease Father     Social History: Lives alone. Independent and working as a Neurosurgeon for [employer]. He reports that he has been smoking Cigarettes--2 1/2 PPD. He has smoked for the past 20 years. He has never  used smokeless tobacco. He reports that he drinks about two  glasses of wine daily. He reports that he does not use illicit drugs.     Allergies  Allergen Reactions  . Advair Diskus [Fluticasone-Salmeterol] Other (See Comments)    Exacerbated BPH   . Flexeril [Cyclobenzaprine] Other (See Comments)    Caused shortness of breath and swelling in scrotum    Medications Prior to Admission  Medication Sig Dispense Refill  . ALPRAZolam (XANAX) 0.5 MG tablet TAKE 1 TABLET BY MOUTH 3 TIMES A DAY AS NEEDED FOR 90 DAYS  0  . benzonatate (TESSALON) 100 MG capsule Take 100-200 mg by mouth 3 (three) times daily as needed for cough.    . cloNIDine (CATAPRES) 0.2 MG tablet Take 0.2 mg by mouth 4 (four) times daily as needed.     . dutasteride (AVODART) 0.5 MG capsule Take 0.5 mg by mouth daily.  0  . Ipratropium-Albuterol (COMBIVENT IN) Inhale into the lungs.    . isradipine (DYNACIRC) 5 MG capsule Take 10 mg by mouth 2 (two) times daily.  1  . spironolactone (ALDACTONE) 50 MG tablet Take 75 mg by mouth daily.    Marland Kitchen telmisartan (MICARDIS) 80 MG tablet Take 80 mg by mouth daily.  1  . traMADol (ULTRAM) 50 MG tablet Take 1 tablet (50 mg total) by mouth every 8 (eight) hours as needed. 30 tablet 0  . [DISCONTINUED] mirtazapine (REMERON) 45 MG tablet Take 45 mg by mouth at bedtime.    . cyclobenzaprine (FLEXERIL) 10 MG tablet Take 1 tablet (10 mg total) by mouth 3 (three) times daily as needed for muscle spasms. (Patient not taking: Reported on 10/11/2014) 30 tablet 0    Home: Home Living Family/patient expects to be discharged to:: Skilled nursing facility Living Arrangements: Alone  Functional History: Prior Function Level of Independence: Independent Comments: was working  Functional Status:  Mobility: Bed Mobility Overal bed mobility: Needs Assistance, +2 for physical assistance Bed Mobility: Rolling, Sidelying to Sit Rolling: Min assist Sidelying to sit: Min  assist Supine to sit: Mod assist, +2 for safety/equipment, HOB elevated Sit to supine: +2 for physical assistance, Max assist General bed mobility comments: Pt sitting up in recliner upon PT arrival.  Transfers Overall transfer level: Needs assistance Equipment used: Rolling walker (2 wheeled) Transfers: Sit to/from Stand Sit to Stand: Total assist, +2 physical assistance Stand pivot transfers: +2 physical assistance, Max assist Squat pivot transfers: Max assist General transfer comment: Attempted sit<>stand x2 at edge of chair. Attempted first with one person on each side of him for support, and again with therapist in front of him and tech in the back to assist with hip extension. We were unable to achieve full standing, and pt with increased difficulty extending both hips and knees.  Ambulation/Gait Ambulation/Gait assistance: Min assist, +2 physical assistance, +2 safety/equipment Ambulation Distance (Feet): 5 Feet Assistive device: None Gait Pattern/deviations: Step-through pattern, Decreased stride length, Wide base of support, Trunk flexed General Gait Details: Unable to attempt this session as pt with increased difficulty with standing activity from the low recliner chair.  Gait velocity: very slow    ADL: ADL Overall ADL's : Needs assistance/impaired Eating/Feeding: Set up, Bed level Grooming: Wash/dry hands, Wash/dry face, Set up, Bed level Grooming Details (indicate cue type and reason): decreased thoroughness, needs cues for task persistence Upper Body Bathing: Moderate assistance, Sitting Lower Body Bathing: Total assistance, Bed level Upper Body Dressing : Minimal assistance, Sitting Lower Body Dressing: Total assistance, Bed level  General ADL Comments: pt very concerned about Lt hand weakness. Focus of session was on Lt UE exercises   Cognition: Cognition Overall Cognitive Status: No family/caregiver present to determine baseline cognitive functioning Orientation  Level: Oriented X4 Cognition Arousal/Alertness: Awake/alert Behavior During Therapy: Flat affect Overall Cognitive Status: No family/caregiver present to determine baseline cognitive functioning Area of Impairment: Following commands, Problem solving, Attention, Memory, Awareness Current Attention Level: Sustained Memory: Decreased short-term memory, Decreased recall of precautions Following Commands: Follows one step commands with increased time Safety/Judgement: Decreased awareness of safety, Decreased awareness of deficits Awareness: Intellectual Problem Solving: Slow processing, Difficulty sequencing, Requires verbal cues, Requires tactile cues General Comments: Pt requires step-by-step instructions (basic cues better).     Blood pressure 107/61, pulse 108, temperature 97.5 F (36.4 C), temperature source Oral, resp. rate 21, height _0  (1.702 m), weight 56.2 kg (123 lb 14.4 oz), SpO2 90 %. Physical Exam  Nursing note and vitals reviewed. Constitutional: He is oriented to person, place, and time. He appears well-developed and well-nourished.  Frail appearing male  HENT:  Head: Normocephalic and atraumatic.  Eyes: Conjunctivae are normal. Pupils are equal, round, and reactive to light.  Neck: Normal range of motion. Neck supple.  Cardiovascular: Normal rate and regular rhythm. no murmurs, rubs, gallops Respiratory: Effort normal. Fair air movement.  He has no wheezes.  GI: Soft. Bowel sounds are normal. He exhibits no distension. There is no tenderness.  Musculoskeletal: He exhibits trace tibial edema.Marland Kitchen He exhibits no tenderness.  Neurological: He is alert and oriented to person, place, and time. Reasonable insight and awareness.   Dysphonic speech but improving. Able to follow one and two step commands without difficulty.  UE: 3+ deltoid, 4 bicep, tricep, wrist, hi. LE: 3 hf, 3+ ke and 4- adf/apf  Skin: Skin is warm and dry.  Dry flaky skin bilateral shins. RLE incisions  healing well--clean,dry and intact. Small scabbed abrasion below left knee laterally.chest wall incision intact  Psychiatric: Thought content normal. His mood appears anxious. Occasionally kids around.      Lab Results Last 48 Hours    Results for orders placed or performed during the hospital encounter of 10/11/14 (from the past 48 hour(s))  Glucose, capillary Status: Abnormal   Collection Time: 11/02/14 12:11 PM  Result Value Ref Range   Glucose-Capillary 136 (H) 65 - 99 mg/dL  Glucose, capillary Status: Abnormal   Collection Time: 11/02/14 4:12 PM  Result Value Ref Range   Glucose-Capillary 133 (H) 65 - 99 mg/dL  Glucose, capillary Status: Abnormal   Collection Time: 11/02/14 9:42 PM  Result Value Ref Range   Glucose-Capillary 102 (H) 65 - 99 mg/dL   Comment 1 Notify RN    Comment 2 Document in Chart   Carboxyhemoglobin Status: Abnormal   Collection Time: 11/03/14 3:50 AM  Result Value Ref Range   Total hemoglobin 10.6 (L) 13.5 - 18.0 g/dL   O2 Saturation 54.0 %   Carboxyhemoglobin 1.9 (H) 0.5 - 1.5 %   Methemoglobin 0.8 0.0 - 1.5 %  CBC Status: Abnormal   Collection Time: 11/03/14 3:58 AM  Result Value Ref Range   WBC 10.7 (H) 4.0 - 10.5 K/uL   RBC 3.03 (L) 4.22 - 5.81 MIL/uL   Hemoglobin 10.4 (L) 13.0 - 17.0 g/dL   HCT 32.9 (L) 39.0 - 52.0 %   MCV 108.6 (H) 78.0 - 100.0 fL   MCH 34.3 (H) 26.0 - 34.0 pg   MCHC 31.6 30.0 - 36.0 g/dL  RDW 14.7 11.5 - 15.5 %   Platelets 500 (H) 150 - 400 K/uL  Basic metabolic panel Status: Abnormal   Collection Time: 11/03/14 3:58 AM  Result Value Ref Range   Sodium 136 135 - 145 mmol/L   Potassium 4.6 3.5 - 5.1 mmol/L   Chloride 95 (L) 101 - 111 mmol/L   CO2 35 (H) 22 - 32 mmol/L   Glucose, Bld 99 65 - 99 mg/dL   BUN 18 6 - 20 mg/dL   Creatinine, Ser 1.05 0.61 - 1.24  mg/dL   Calcium 8.9 8.9 - 10.3 mg/dL   GFR calc non Af Amer >60 >60 mL/min   GFR calc Af Amer >60 >60 mL/min    Comment: (NOTE) The eGFR has been calculated using the CKD EPI equation. This calculation has not been validated in all clinical situations. eGFR's persistently <60 mL/min signify possible Chronic Kidney Disease.    Anion gap 6 5 - 15  Brain natriuretic peptide Status: Abnormal   Collection Time: 11/03/14 3:58 AM  Result Value Ref Range   B Natriuretic Peptide 699.7 (H) 0.0 - 100.0 pg/mL  Glucose, capillary Status: Abnormal   Collection Time: 11/03/14 9:19 AM  Result Value Ref Range   Glucose-Capillary 115 (H) 65 - 99 mg/dL  Glucose, capillary Status: Abnormal   Collection Time: 11/03/14 12:42 PM  Result Value Ref Range   Glucose-Capillary 149 (H) 65 - 99 mg/dL  Glucose, capillary Status: Abnormal   Collection Time: 11/03/14 3:40 PM  Result Value Ref Range   Glucose-Capillary 110 (H) 65 - 99 mg/dL   Comment 1 Notify RN   Glucose, capillary Status: Abnormal   Collection Time: 11/03/14 7:15 PM  Result Value Ref Range   Glucose-Capillary 116 (H) 65 - 99 mg/dL   Comment 1 Capillary Specimen   Glucose, capillary Status: Abnormal   Collection Time: 11/03/14 9:13 PM  Result Value Ref Range   Glucose-Capillary 126 (H) 65 - 99 mg/dL   Comment 1 Capillary Specimen   Carboxyhemoglobin Status: Abnormal   Collection Time: 11/04/14 4:20 AM  Result Value Ref Range   Total hemoglobin 10.7 (L) 13.5 - 18.0 g/dL   O2 Saturation 62.2 %   Carboxyhemoglobin 2.0 (H) 0.5 - 1.5 %   Methemoglobin 1.0 0.0 - 1.5 %  Glucose, capillary Status: Abnormal   Collection Time: 11/04/14 8:10 AM  Result Value Ref Range   Glucose-Capillary 123 (H) 65 - 99 mg/dL   Comment 1 Capillary Specimen    Comment 2 Notify RN        Imaging Results (Last 48 hours)    Dg Chest Port 1 View  11/03/2014 CLINICAL DATA: Pleural effusions, short of breath EXAM: PORTABLE CHEST - 1 VIEW COMPARISON: Radiograph 11/01/2014 FINDINGS: Interval placement of a RIGHT PICC line with tip at the cavoatrial junction. Midline sternotomy the overlies large cardiac silhouette. There is bibasilar airspace disease suggesting pulmonary edema. There is bilateral small effusions slightly increased. IMPRESSION: 1. No interval change in basilar pulmonary edema pattern. 2. Slight increase in pleural effusions. 3. Introduction of RIGHT PICC line with tip at the cavoatrial junction. Electronically Signed By: Suzy Bouchard M.D. On: 11/03/2014 07:53       Medical Problem List and Plan: 1. Functional deficits secondary to debility after cute respiratory failure and CHF exacerbation s/p CABG. 2. DVT Prophylaxis/Anticoagulation: Pharmaceutical: Lovenox due to VTE risk. 3. Pain Management: Not controlled with ultram. Will continue oxycodone prn for now.  4. Mood: Increase Remeron to home dose to  help manage insomnia and mood. Team to provide ego support. LCSW to follow for evaluation and support.  5. Neuropsych: This patient is capable of making decisions on his own behalf. 6. Skin/Wound Care: Routine pressure relief measures. Maintain adequate nutritional intake. Offer supplements between meals.  7. Fluids/Electrolytes/Nutrition: Monitor I/O. Daily labs to monitor metabolic/electrolyte status 8. CAD s/p CABG: Continue sternal precautions. Continues to have incisional pain but no CP. On ASA, lipitor, coreg and Entresto.  9. Acute on systolic CHF: Monitor daily weights. Low salt diet. On lasix, aldactone, coreg, Entestor and digoxin. Cardiology to follow for input. appears fluid balanced at present 10 COPD: Avoid Spiriva with BPH/voiding difficulties. Continue Brovan/pulmicort with prn Xopenex. To be discharged on symbicort 80/4.5 2 puffs  bid + prn xopenex + follow up in pulmonary clinic as long as pulmonary parameters do not change. 11. ABLA: Slowly improving. Will continue to monitor and check follow up CBC in am.  12. HTN: Soft blood pressures but asymptomatic at present. Observe for activity tolerance.  13. Urinary retention/BPH: continue Avodart. Does not want foley removed yet as anxious about recurrent voiding difficulty. Discussed d/c in 2-3 days as mobility and activity level improves. Will check ua/ucs as well.  14. Adjustment disorder: Mentation improving. Continue Xanax for chronic anxiety/ adjustment reaction.  15. Reactive leucocytosis: Likely due to steroids/surgery. Will monitor for signs of infection. Exam unremarkable in this regard  16. Stress induced hyperglycemia: Hgb A1C- 5.9. Will continue SSI with aggressive BS monitoring to help promote healing past CABG.     Post Admission Physician Evaluation: 1. Functional deficits secondary to Debility due to acute respiratory failure and CHF exacerbation past CABG. 2. Patient is admitted to receive collaborative, interdisciplinary care between the physiatrist, rehab nursing staff, and therapy team. 3. Patient's level of medical complexity and substantial therapy needs in context of that medical necessity cannot be provided at a lesser intensity of care such as a SNF. 4. Patient has experienced substantial functional loss from his/her baseline which was documented above under the "Functional History" and "Functional Status" headings. Judging by the patient's diagnosis, physical exam, and functional history, the patient has potential for functional progress which will result in measurable gains while on inpatient rehab. These gains will be of substantial and practical use upon discharge in facilitating mobility and self-care at the household level. 5. Physiatrist will provide 24 hour management of medical needs as well as oversight of the therapy plan/treatment  and provide guidance as appropriate regarding the interaction of the two. 6. 24 hour rehab nursing will assist with bladder management, bowel management, safety, skin/wound care, disease management, medication administration, pain management and patient education and help integrate therapy concepts, techniques,education, etc. 7. PT will assess and treat for/with: Lower extremity strength, range of motion, stamina, balance, functional mobility, safety, adaptive techniques and equipment, pain mgt, activity tolerance, CV monitoring. Goals are: mod I. 8. OT will assess and treat for/with: ADL's, functional mobility, safety, upper extremity strength, adaptive techniques and equipment, pain mgt, community reintegration, activity tolerance. Goals are: mod I to supervision. Therapy may proceed with showering this patient. 9. SLP will assess and treat for/with: communication, speech. Goals are: mod I. 10. Case Management and Social Worker will assess and treat for psychological issues and discharge planning. 11. Team conference will be held weekly to assess progress toward goals and to determine barriers to discharge. 12. Patient will receive at least 3 hours of therapy per day at least 5 days per week. 13. ELOS: 15-20  days  14. Prognosis: excellent     Meredith Staggers, MD, [facility] 11/04/2014

## 2014-11-04 NOTE — Plan of Care (Signed)
Problem: Phase III Progression Outcomes Goal: Discharge plan remains appropriate-arrangements made Outcome: Completed/Met Date Met:  11/04/14 Inpatient Rehab consult

## 2014-11-04 NOTE — Progress Notes (Signed)
Retta Diones, RN Rehab Admission Coordinator Signed Physical Medicine and Rehabilitation PMR Pre-admission 11/01/2014 12:01 PM  Related encounter: ED to Hosp-Admission (Discharged) from 10/11/2014 in [facility] 2S CVT SURGICAL ICU    Expand All Collapse All   PMR Admission Coordinator Pre-Admission Assessment  Patient: Travis Romero is an 64 y.o., male MRN: [id number] DOB: [birth year] Height: _0  (170.2 cm) Weight: 56.2 kg (123 lb 14.4 oz)  Insurance Information HMO: PPO: Yes PCP: IPA: 80/20: OTHER: Group # [id number] PRIMARY: [employer] Policy#: [id number] Subscriber: Wende Bushy CM Name: Dudley Major (with [employer]) Phone#: [phone] Fax#: [phone] Pre-Cert#: [id number] X 7 days with update due 11/11/14 Employer: [employer] Benefits: Phone #: [phone] (Key Benefit Admin.) Name: Helene Kelp [facility] services) Irene Shipper. Date: 11/04/10 Deduct: $3000 (met $1987.91) Out of Pocket Max: [phone] (met [id number]) Life Max: None CIR: 60% w/auth SNF: 60% with 70 days per year Outpatient: 30 visits/year Co-Pay: $40 copay Home Health: 60% Co-Pay: 40% DME: 60% Co-Pay: 40% Providers: in network  Emergency Contact Information Contact Information    Name Relation Home Work Mobile   Maybee Sister   [phone]   Inda Castle [phone]  [phone]     Current Medical History  Patient Admitting Diagnosis: Debility after respiratory failure, CHF exacerbation  History of Present Illness: A 64 y.o. male with history of HTN, COPD, tobacco abuse who was admitted on 10/11/14 with progressive SOB due to acute on chronic respiratory failure and was intubation at admission. He was found to have acute systolic CHF requiring aggressive diuresis and 2 D  echo done revealing EF 20-25% with mild MR. 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 Post op extubated without difficulty and volume excess treated with diuresis. Foley placed due to urinary retention and he was started on IV antibiotics due ongoing hypoxia and concerns of COPD exacerbation/presumed PNA. He underwent thoracocentesis of 1.5 L of dark blood fluid on 07/26 and PCCM consulted for input. Aggressive pulmonary toilet, increase in activity as well as chest PT recommended. He underwent right thoracocentesis on 07/27 with improvement in respiratory status. Patient has had issues with confusion, processing deficits, poor safety awareness as well as left hand/wrist weakness. Therapy ongoing and CIR recommended due to severe deconditioning.   Past Medical History  Past Medical History  Diagnosis Date  . Hypertension   . COPD (chronic obstructive pulmonary disease)   . BPH (benign prostatic hyperplasia)   . Back pain 2007    after fall down steps    Family History  family history includes Heart disease in his father; Hypertension in his mother.  Prior Rehab/Hospitalizations:  Has the patient had major surgery during 100 days prior to admission? No  Current Medications   Current facility-administered medications:  . 0.45 % sodium chloride infusion, , Intravenous, Continuous PRN, John Giovanni, PA-C, Last Rate: 20 mL/hr at 10/25/14 2130 . 0.9 % sodium chloride infusion, , Intravenous, Continuous, Melrose Nakayama, MD, Stopped at 10/31/14 1000 . ALPRAZolam Duanne Moron) tablet 0.5 mg, 0.5 mg, Oral, TID PRN, Melrose Nakayama, MD, 0.5 mg at 11/03/14 2240 . arformoterol (BROVANA) nebulizer solution 15 mcg, 15 mcg, Nebulization, BID, Juanito Doom, MD, 15 mcg at 11/04/14 0904 . aspirin EC tablet 325 mg, 325 mg, Oral, Daily, 325 mg at 11/03/14 1049 **OR** aspirin  chewable tablet 324 mg, 324 mg, Per Tube, Daily, Wayne E Gold, PA-C . atorvastatin (LIPITOR) tablet 80 mg, 80  mg, Oral, q1800, Troy Sine, MD, 80 mg at 11/03/14 1819 . benzonatate (TESSALON) capsule 100-200 mg, 100-200 mg, Oral, TID PRN, Melrose Nakayama, MD . bisacodyl (DULCOLAX) EC tablet 10 mg, 10 mg, Oral, Daily, 10 mg at 11/03/14 1048 **OR** bisacodyl (DULCOLAX) suppository 10 mg, 10 mg, Rectal, Daily, Wayne E Gold, PA-C . budesonide (PULMICORT) nebulizer solution 0.5 mg, 0.5 mg, Nebulization, BID, Juanito Doom, MD, 0.5 mg at 11/04/14 0904 . carvedilol (COREG) tablet 9.375 mg, 9.375 mg, Oral, BID WC, Amy D Clegg, NP . digoxin (LANOXIN) tablet 0.125 mg, 0.125 mg, Oral, Daily, Amy D Clegg, NP . docusate sodium (COLACE) capsule 200 mg, 200 mg, Oral, Daily, Wayne E Gold, PA-C, 200 mg at 11/03/14 1049 . dutasteride (AVODART) capsule 0.5 mg, 0.5 mg, Oral, Daily, Melrose Nakayama, MD, 0.5 mg at 11/03/14 1049 . enoxaparin (LOVENOX) injection 40 mg, 40 mg, Subcutaneous, QHS, Melrose Nakayama, MD, 40 mg at 11/03/14 2228 . [START ON 11/05/2014] furosemide (LASIX) tablet 40 mg, 40 mg, Oral, Daily, Amy D Clegg, NP . gi cocktail (Maalox,Lidocaine,Donnatal), 30 mL, Oral, TID PRN, Melrose Nakayama, MD . hydrocerin (EUCERIN) cream, , Topical, PRN, Gaye Pollack, MD . insulin aspart (novoLOG) injection 0-9 Units, 0-9 Units, Subcutaneous, TID WC, Melrose Nakayama, MD, 1 Units at 11/04/14 [phone] . levalbuterol (XOPENEX) nebulizer solution 0.63 mg, 0.63 mg, Nebulization, Q6H PRN, Juanito Doom, MD . metoprolol (LOPRESSOR) injection 2.5-5 mg, 2.5-5 mg, Intravenous, Q2H PRN, Wayne E Gold, PA-C, 5 mg at 10/28/14 0207 . mirtazapine (REMERON) tablet 30 mg, 30 mg, Oral, QHS, Brandi L Ollis, NP, 30 mg at 11/03/14 2231 . morphine 2 MG/ML injection 2-5 mg, 2-5 mg, Intravenous, Q2H PRN, Donita Brooks, NP . nicotine (NICODERM CQ - dosed in mg/24 hours) patch 21 mg, 21 mg,  Transdermal, Daily, Gaye Pollack, MD, 21 mg at 11/03/14 1049 . ondansetron (ZOFRAN) injection 4 mg, 4 mg, Intravenous, Q6H PRN, Wayne E Gold, PA-C . oxyCODONE (Oxy IR/ROXICODONE) immediate release tablet 5-10 mg, 5-10 mg, Oral, Q6H PRN, Donita Brooks, NP, 5 mg at 10/30/14 0820 . pantoprazole (PROTONIX) EC tablet 40 mg, 40 mg, Oral, Daily, Wayne E Gold, PA-C, 40 mg at 11/03/14 1049 . sacubitril-valsartan (ENTRESTO) 49-51 mg per tablet, 1 tablet, Oral, BID, Melrose Nakayama, MD, 1 tablet at 11/03/14 2230 . sodium chloride 0.9 % injection 10-40 mL, 10-40 mL, Intracatheter, Q12H, Melrose Nakayama, MD, 10 mL at 11/03/14 2200 . sodium chloride 0.9 % injection 10-40 mL, 10-40 mL, Intracatheter, PRN, Melrose Nakayama, MD . spironolactone (ALDACTONE) tablet 25 mg, 25 mg, Oral, Daily, Melrose Nakayama, MD, 25 mg at 11/03/14 1049 . traMADol (ULTRAM) tablet 50-100 mg, 50-100 mg, Oral, Q4H PRN, Wilder Glade Gold, PA-C, 50 mg at 11/04/14 6256 . zolpidem (AMBIEN) tablet 5 mg, 5 mg, Oral, QHS PRN, Melrose Nakayama, MD, 5 mg at 11/03/14 2231  Patients Current Diet: Diet heart healthy/carb modified Room service appropriate?: Yes; Fluid consistency:: Thin  Precautions / Restrictions Precautions Precautions: Fall, Sternal Precaution Comments: watch sats Restrictions Weight Bearing Restrictions: No   Has the patient had 2 or more falls or a fall with injury in the past year?No. Has had 1 fall which resulted in soreness.  Prior Activity Level Community (5-7x/wk): Went out daily. Worked FT. Was driving.  Home Assistive Devices / Equipment Home Assistive Devices/Equipment: None  Prior Device Use: Indicate devices/aids used by the patient prior to current illness, exacerbation or injury? None of the above.  No devices used PTA>  Prior Functional Level Prior Function Level of Independence: Independent Comments: was working  Self Care: Did the patient need help bathing, dressing,  using the toilet or eating? Independent  Indoor Mobility: Did the patient need assistance with walking from room to room (with or without device)? Independent  Stairs: Did the patient need assistance with internal or external stairs (with or without device)? Independent  Functional Cognition: Did the patient need help planning regular tasks such as shopping or remembering to take medications? Independent  Current Functional Level Cognition  Overall Cognitive Status: No family/caregiver present to determine baseline cognitive functioning Current Attention Level: Sustained Orientation Level: Oriented X4 Following Commands: Follows one step commands with increased time Safety/Judgement: Decreased awareness of safety, Decreased awareness of deficits General Comments: Pt requires step-by-step instructions (basic cues better).    Extremity Assessment (includes Sensation/Coordination)  Upper Extremity Assessment: LUE deficits/detail LUE Deficits / Details: mild edema in hand with 4-/5 gross grasp, 4/5 wrist extension and inability to extend 3rd to 5th fingers fully LUE Coordination: decreased fine motor, decreased gross motor  Lower Extremity Assessment: Defer to PT evaluation    ADLs  Overall ADL's : Needs assistance/impaired Eating/Feeding: Set up, Bed level Grooming: Wash/dry hands, Wash/dry face, Set up, Bed level Grooming Details (indicate cue type and reason): decreased thoroughness, needs cues for task persistence Upper Body Bathing: Moderate assistance, Sitting Lower Body Bathing: Total assistance, Bed level Upper Body Dressing : Minimal assistance, Sitting Lower Body Dressing: Total assistance, Bed level General ADL Comments: pt very concerned about Lt hand weakness. Focus of session was on Lt UE exercises     Mobility  Overal bed mobility: Needs Assistance, +2 for physical assistance Bed Mobility: Rolling, Sidelying to Sit Rolling: Min assist Sidelying to sit:  Min assist Supine to sit: Mod assist, +2 for safety/equipment, HOB elevated Sit to supine: +2 for physical assistance, Max assist General bed mobility comments: Pt sitting up in recliner upon PT arrival.     Transfers  Overall transfer level: Needs assistance Equipment used: Rolling walker (2 wheeled) Transfers: Sit to/from Stand Sit to Stand: Total assist, +2 physical assistance Stand pivot transfers: +2 physical assistance, Max assist Squat pivot transfers: Max assist General transfer comment: Attempted sit<>stand x2 at edge of chair. Attempted first with one person on each side of him for support, and again with therapist in front of him and tech in the back to assist with hip extension. We were unable to achieve full standing, and pt with increased difficulty extending both hips and knees.     Ambulation / Gait / Stairs / Wheelchair Mobility  Ambulation/Gait Ambulation/Gait assistance: Min assist, +2 physical assistance, +2 safety/equipment Ambulation Distance (Feet): 5 Feet Assistive device: None Gait Pattern/deviations: Step-through pattern, Decreased stride length, Wide base of support, Trunk flexed General Gait Details: Unable to attempt this session as pt with increased difficulty with standing activity from the low recliner chair.  Gait velocity: very slow    Posture / Balance Dynamic Sitting Balance Sitting balance - Comments: feet unsupported initially and mod assist to maintain balance (posterior lean) Balance Overall balance assessment: Needs assistance Sitting-balance support: Feet supported, No upper extremity supported Sitting balance-Leahy Scale: Fair Sitting balance - Comments: feet unsupported initially and mod assist to maintain balance (posterior lean) Postural control: Posterior lean Standing balance support: Bilateral upper extremity supported, During functional activity Standing balance-Leahy Scale: Zero Standing balance comment: stood x 60 seconds  for pericare with RW progressing from mod assist of  one to max assist of 2 due to retropulsion    Special needs/care consideration BiPAP/CPAP No CPM No Continuous Drip IV KVO Dialysis No  Life Vest No Oxygen Yes, 02 at 2L/[facility] Special Bed No Trach Size  Wound Vac (area) No  Skin Had a rash on his legs PTA. Post op dressings right thigh and mid chest.  Bowel mgmt: Last BM 11/03/14 Bladder mgmt: Urinary catheter Diabetic mgmt No    Previous Home Environment Living Arrangements: Alone Home Care Services: No  Discharge Living Setting Plans for Discharge Living Setting: Alone, Apartment Type of Home at Discharge: Apartment Discharge Home Layout: One level (Apartment is on the second level.) Discharge Home Access: Stairs to enter Entrance Stairs-Number of Steps: at least 10 steps to 2nd level apartment and stairs are rusty and old. Does the patient have any problems obtaining your medications?: No  Social/Family/Support Systems Patient Roles: Other (Comment) (Has a sister and a cousin.) Contact Information: Achille Rich - sister [phone] Anticipated Caregiver: sister for a week after rehab stay Anticipated Caregiver's Contact Information: Sister is staying with cousin, Carrington Clamp, [phone] Ability/Limitations of Caregiver: Sister can provide supervision and stay in town for a week to assist after discharge from rehab. Caregiver Availability: Other (Comment) (Has help for 1 week after discharge.) Discharge Plan Discussed with Primary Caregiver: Yes Is Caregiver In Agreement with Plan?: Yes Does Caregiver/Family have Issues with Lodging/Transportation while Pt is in Rehab?: No (Sister from out of town staying with cousin.)  Goals/Additional Needs Patient/Family Goal for Rehab: PT/OT mod I/supervision goals Expected length of stay: 13-18 days Cultural Considerations: None Dietary Needs: Heart healthy, carb mod, thin liquids Equipment  Needs: TBD Pt/Family Agrees to Admission and willing to participate: Yes Program Orientation Provided & Reviewed with Pt/Caregiver Including Roles & Responsibilities: Yes  Decrease burden of Care through IP rehab admission: N/A  Possible need for SNF placement upon discharge: Yes, if patient does not progress to point where he can discharge home with short term follow up care.  Patient Condition: This patient's medical and functional status has changed since the consult dated: 10/31/14 in which the Rehabilitation Physician determined and documented that the patient's condition is appropriate for intensive rehabilitative care in an inpatient rehabilitation facility. See "History of Present Illness" (above) for medical update. Functional changes are: Currently requiring total assist + 2 for transfers. Patient's medical and functional status update has been discussed with the Rehabilitation physician and patient remains appropriate for inpatient rehabilitation. Will admit to inpatient rehab today.  Preadmission Screen Completed By: Retta Diones, 11/04/2014 9:29 AM ______________________________________________________________________  Discussed status with Dr. Naaman Plummer on 11/04/14 at [phone] and received telephone approval for admission today.  Admission Coordinator: Retta Diones, time0929/Date08/01/16          Cosigned by: Meredith Staggers, MD at 11/04/2014 9:52 AM  Revision History     Date/Time User Provider Type Action   11/04/2014 9:52 AM Meredith Staggers, MD Physician Cosign   11/04/2014 9:29 AM Retta Diones, RN Rehab Admission Coordinator Sign

## 2014-11-04 NOTE — Progress Notes (Signed)
AV pacing wires d/c'd per protocol. Pt tolerated well. Instructed to remain in bed x 1hr. Verbalized understanding

## 2014-11-04 NOTE — Clinical Social Work Note (Signed)
Patient will be discharging to inpatient rehab according to rehab admission coordinator today.  CSW to sign off please reconsult if other social work needs arise.  Jones Broom. Tyrone, MSW, [place] 11/04/2014 12:37 PM

## 2014-11-04 NOTE — Progress Notes (Signed)
Rehab admissions - Noted patient is medically ready for inpatient rehab today.  Bed available and will admit to rehab today.  Call me for questions.  #[phone]

## 2014-11-05 ENCOUNTER — Inpatient Hospital Stay (HOSPITAL_COMMUNITY): Payer: PRIVATE HEALTH INSURANCE | Admitting: Physical Therapy

## 2014-11-05 ENCOUNTER — Inpatient Hospital Stay (HOSPITAL_COMMUNITY): Payer: PRIVATE HEALTH INSURANCE | Admitting: Speech Pathology

## 2014-11-05 ENCOUNTER — Inpatient Hospital Stay (HOSPITAL_COMMUNITY): Payer: PRIVATE HEALTH INSURANCE

## 2014-11-05 DIAGNOSIS — I5041 Acute combined systolic (congestive) and diastolic (congestive) heart failure: Secondary | ICD-10-CM

## 2014-11-05 DIAGNOSIS — N400 Enlarged prostate without lower urinary tract symptoms: Secondary | ICD-10-CM

## 2014-11-05 DIAGNOSIS — R5381 Other malaise: Secondary | ICD-10-CM

## 2014-11-05 DIAGNOSIS — I10 Essential (primary) hypertension: Secondary | ICD-10-CM

## 2014-11-05 DIAGNOSIS — J438 Other emphysema: Secondary | ICD-10-CM

## 2014-11-05 LAB — COMPREHENSIVE METABOLIC PANEL
ALK PHOS: 93 U/L (ref 38–126)
ALT: 23 U/L (ref 17–63)
ANION GAP: 9 (ref 5–15)
AST: 19 U/L (ref 15–41)
Albumin: 2.2 g/dL — ABNORMAL LOW (ref 3.5–5.0)
BUN: 23 mg/dL — ABNORMAL HIGH (ref 6–20)
CO2: 34 mmol/L — AB (ref 22–32)
Calcium: 8.9 mg/dL (ref 8.9–10.3)
Chloride: 90 mmol/L — ABNORMAL LOW (ref 101–111)
Creatinine, Ser: 1.09 mg/dL (ref 0.61–1.24)
GFR calc Af Amer: >60 mL/min (ref >60)
GLUCOSE: 105 mg/dL — AB (ref 65–99)
POTASSIUM: 4.5 mmol/L (ref 3.5–5.1)
Sodium: 133 mmol/L — ABNORMAL LOW (ref 135–145)
Total Bilirubin: 0.5 mg/dL (ref 0.3–1.2)
Total Protein: 5.4 g/dL — ABNORMAL LOW (ref 6.5–8.1)

## 2014-11-05 LAB — GLUCOSE, CAPILLARY
GLUCOSE-CAPILLARY: 135 mg/dL — AB (ref 65–99)
GLUCOSE-CAPILLARY: 178 mg/dL — AB (ref 65–99)
GLUCOSE-CAPILLARY: 115 mg/dL — AB (ref 65–99)
Glucose-Capillary: 125 mg/dL — ABNORMAL HIGH (ref 65–99)

## 2014-11-05 LAB — CBC WITH DIFFERENTIAL/PLATELET
Basophils Absolute: 0.1 10*3/uL (ref 0.0–0.1)
Basophils Relative: 1 % (ref 0–1)
EOS ABS: 0.3 10*3/uL (ref 0.0–0.7)
Eosinophils Relative: 2 % (ref 0–5)
HEMATOCRIT: 33.6 % — AB (ref 39.0–52.0)
HEMOGLOBIN: 10.7 g/dL — AB (ref 13.0–17.0)
LYMPHS ABS: 1.4 10*3/uL (ref 0.7–4.0)
Lymphocytes Relative: 12 % (ref 12–46)
MCH: 34.7 pg — ABNORMAL HIGH (ref 26.0–34.0)
MCHC: 31.8 g/dL (ref 30.0–36.0)
MCV: 109.1 fL — ABNORMAL HIGH (ref 78.0–100.0)
MONOS PCT: 17 % — AB (ref 3–12)
Monocytes Absolute: 1.9 10*3/uL — ABNORMAL HIGH (ref 0.1–1.0)
Neutro Abs: 7.8 10*3/uL — ABNORMAL HIGH (ref 1.7–7.7)
Neutrophils Relative %: 68 % (ref 43–77)
Platelets: 451 10*3/uL — ABNORMAL HIGH (ref 150–400)
RBC: 3.08 MIL/uL — ABNORMAL LOW (ref 4.22–5.81)
RDW: 14.7 % (ref 11.5–15.5)
WBC: 11.5 10*3/uL — AB (ref 4.0–10.5)

## 2014-11-05 MED ORDER — ALPRAZOLAM 0.5 MG PO TABS
0.5000 mg | ORAL_TABLET | Freq: Four times a day (QID) | ORAL | Status: DC | PRN
  Administered 2014-11-05 – 2014-11-26 (×65): 0.5 mg via ORAL
  Filled 2014-11-05 (×67): qty 1

## 2014-11-05 MED ORDER — SODIUM CHLORIDE 0.9 % IJ SOLN
10.0000 mL | INTRAMUSCULAR | Status: DC | PRN
  Administered 2014-11-05 – 2014-11-08 (×5): 10 mL
  Administered 2014-11-08: 20 mL
  Administered 2014-11-09 (×4): 10 mL
  Administered 2014-11-11: 20 mL
  Administered 2014-11-15: 10 mL
  Filled 2014-11-05 (×11): qty 40

## 2014-11-05 MED ORDER — SENNOSIDES-DOCUSATE SODIUM 8.6-50 MG PO TABS: 2.0000 | ORAL_TABLET | Freq: Every day | ORAL | Status: DC

## 2014-11-05 NOTE — Progress Notes (Signed)
Patient information reviewed and entered into eRehab system by Miche Loughridge, RN, CRRN, PPS Coordinator.  Information including medical coding and functional independence measure will be reviewed and updated through discharge.    

## 2014-11-05 NOTE — Progress Notes (Addendum)
PHYSICAL MEDICINE & REHABILITATION     PROGRESS NOTE    Subjective/Complaints:   Objective: Vital Signs: Blood pressure 134/68, pulse 103, temperature 98 F (36.7 C), temperature source Oral, resp. rate 16, height 5\' 7"  (1.702 m), weight 58.4 kg (128 lb 12 oz), SpO2 94 %. No results found.  Recent Labs  11/03/14 0358 11/05/14 0558  WBC 10.7* 11.5*  HGB 10.4* 10.7*  HCT 32.9* 33.6*  PLT 500* 451*    Recent Labs  11/04/14 0911 11/05/14 0558  NA 134* 133*  K 4.5 4.5  CL 92* 90*  GLUCOSE 172* 105*  BUN 23* 23*  CREATININE 1.13 1.09  CALCIUM 9.1 8.9   CBG (last 3)   Recent Labs  11/04/14 1629 11/04/14 2106 11/05/14 0640  GLUCAP 130* 159* 135*    Wt Readings from Last 3 Encounters:  11/05/14 58.4 kg (128 lb 12 oz)  11/04/14 56.2 kg (123 lb 14.4 oz)  10/07/14 68.493 kg (151 lb)    Physical Exam:  Constitutional: He is oriented to person, place, and time. He appears well-developed and well-nourished.  Frail appearing male  HENT: oral mucosa a little dry Head: Normocephalic and atraumatic.  Eyes: Conjunctivae are normal. Pupils are equal, round, and reactive to light.  Neck: Normal range of motion. Neck supple.  Cardiovascular: Normal rate and regular rhythm. no murmurs, rubs, gallops Respiratory: Effort normal. Fair air movement. He has no wheezes. No distress GI: Soft. Bowel sounds are normal. He exhibits no distension. There is no tenderness.  Musculoskeletal: He exhibits trace tibial edema.Marland Kitchen He exhibits no tenderness.  Neurological: He is alert and oriented to person, place, and time. Reasonable insight and awareness.  Dysphonic speech  improving. Able to follow one and two step commands without difficulty.  UE: 3+ deltoid, 4 bicep, tricep, wrist, hi. LE: 3 hf, 3+ ke and 4- adf/apf  Skin: Skin is warm and dry.  Dry skin bilateral shins. RLE incisions healing well--clean,dry and intact. Small scabbed abrasion below left knee  laterally.chest wall incision intact  Psychiatric: Thought content normal. Pleasant mood.   Assessment/Plan: 1. Functional deficits secondary to debility after CABG and multiple medical  which require 3+ hours per day of interdisciplinary therapy in a comprehensive inpatient rehab setting. Physiatrist is providing close team supervision and 24 hour management of active medical problems listed below. Physiatrist and rehab team continue to assess barriers to discharge/monitor patient progress toward functional and medical goals. FIM:                   Comprehension Comprehension Mode: Auditory Comprehension: 4-Understands basic 75 - 89% of the time/requires cueing 10 - 24% of the time  Expression Expression Mode: Verbal Expression: 4-Expresses basic 75 - 89% of the time/requires cueing 10 - 24% of the time. Needs helper to occlude trach/needs to repeat words.  Social Interaction Social Interaction: 4-Interacts appropriately 75 - 89% of the time - Needs redirection for appropriate language or to initiate interaction.  Problem Solving Problem Solving: 4-Solves basic 75 - 89% of the time/requires cueing 10 - 24% of the time  Memory Memory: 4-Recognizes or recalls 75 - 89% of the time/requires cueing 10 - 24% of the time  Medical Problem List and Plan: 1. Functional deficits secondary to debility after cute respiratory failure and CHF exacerbation s/p CABG. 2. DVT Prophylaxis/Anticoagulation: Pharmaceutical: Lovenox due to VTE risk is required 3. Pain Management: oxycodone prn---observe activity tolerance with therapy 4. Mood: Increase Remeron to home dose to help  manage insomnia and mood. Team to provide ego support. LCSW to follow for evaluation and support.  5. Neuropsych: This patient is capable of making decisions on his own behalf. 6. Skin/Wound Care: Routine pressure relief measures. Maintain adequate nutritional intake. Offer supplements between meals.  7.  Fluids/Electrolytes/Nutrition: Monitor I/O. Daily labs to monitor metabolic/electrolyte status 8. CAD s/p CABG: Continue sternal precautions. Continues to have incisional pain but no CP. On ASA, lipitor, coreg and Entresto.  9. Acute on systolic CHF: Monitor daily weights. Low salt diet. On lasix, aldactone, coreg, Entestor and digoxin.  - Cardiology to follow for input. weight down to 58.4kg today, BUN trending up  -all labs personally reviewed today. recheck bmet tomorrow 10 COPD: Avoiding Spiriva with BPH/voiding difficulties. Continue Brovan/pulmicort with prn Xopenex. To be discharged on symbicort 80/4.5 2 puffs bid + prn xopenex  -outpt follow up with pulmonary at dc  -lungs clear at present 11. ABLA: Slowly improving. Labs reviewed. Steady today 12. HTN: Soft blood pressures but asymptomatic at present. Observe for activity tolerance.  13. Urinary retention/BPH: continue Avodart. Does not want foley removed yet as anxious about recurrent voiding difficulty. Will continue for now until mobility adequately improves  -ua unremarkable , ucx pending  14. Adjustment disorder: Mentation improving. Continue Xanax for chronic anxiety/ adjustment reaction.  15. Reactive leucocytosis: Likely due to steroids/surgery. No signs of infection on exam 16. Stress induced hyperglycemia: Hgb A1C- 5.9. Will continue SSI with aggressive BS monitoring to help promote healing past CABG.  LOS (Days) 1 A FACE TO FACE EVALUATION WAS PERFORMED  Delaine Hernandez T 11/05/2014 8:50 AM

## 2014-11-05 NOTE — IPOC Note (Signed)
Overall Plan of Care [facility]) Patient Details Name: Travis Romero MRN: [id number] DOB: [birth year]  Admitting Diagnosis: Debility after resp failure  CHF and CABG  Hospital Problems: Principal Problem:   Physical debility Active Problems:   Benign essential HTN   BPH (benign prostatic hyperplasia)   COPD (chronic obstructive pulmonary disease)   Acute combined systolic and diastolic CHF, NYHA class 4   S/P CABG x 3     Functional Problem List: Nursing Bowel, Bladder, Pain, Safety, Skin Integrity  PT Balance, Pain, Perception, Safety, Endurance, Motor  OT Balance, Safety, Cognition, Endurance, Motor  SLP Cognition  TR         Basic ADL's: OT Grooming, Bathing, Dressing     Advanced  ADL's: OT       Transfers: PT Bed Mobility, Bed to Chair, Car, Patent attorney, Agricultural engineer: PT Ambulation, Stairs, Emergency planning/management officer     Additional Impairments: OT Fuctional Use of Upper Extremity  SLP Social Cognition   Problem Solving, Memory, Attention, Awareness  TR      Anticipated Outcomes Item Anticipated Outcome  Self Feeding mod I  Swallowing      Basic self-care  supervision  Toileting  supervision   Bathroom Transfers supervision   Bowel/Bladder  Mod I  Transfers  min A to supervision  Locomotion  min A to supervision  Communication  Supervision  Cognition  Supervision   Pain  <3  Safety/Judgment  Supervision   Therapy Plan: PT Frequency: 5 out of 7 days PT Duration Estimated Length of Stay: 14-20 days OT Intensity: Minimum of 1-2 x/day, 45 to 90 minutes OT Frequency: 5 out of 7 days OT Duration/Estimated Length of Stay: 18-21 days SLP Intensity: Minumum of 1-2 x/day, 30 to 90 minutes SLP Frequency: 3 to 5 out of 7 days SLP Duration/Estimated Length of Stay: 11/26/14       Team Interventions: Nursing Interventions Patient/Family Education, Bladder Management, Bowel Management, Disease Management/Prevention, Skin Care/Wound  Management, Psychosocial Support  PT interventions Ambulation/gait training, Training and development officer, Cognitive remediation/compensation, Discharge planning, Community reintegration, DME/adaptive equipment instruction, Functional mobility training, Therapeutic Activities, UE/LE Strength taining/ROM, Wheelchair propulsion/positioning, UE/LE Coordination activities, Therapeutic Exercise, Stair training, Patient/family education, Neuromuscular re-education  OT Interventions Training and development officer, Discharge planning, Self Care/advanced ADL retraining, Pain management, Therapeutic Activities, UE/LE Coordination activities, Cognitive remediation/compensation, Functional mobility training, Patient/family education, Therapeutic Exercise, Community reintegration, Engineer, drilling, Neuromuscular re-education, Psychosocial support, UE/LE Strength taining/ROM  SLP Interventions Cognitive remediation/compensation, English as a second language teacher, Functional tasks, Environmental controls, Internal/external aids, Patient/family education, Therapeutic Activities, Speech/Language facilitation  TR Interventions    SW/CM Interventions Discharge Planning, Psychosocial Support, Patient/Family Education    Team Discharge Planning: Destination: PT-  ,OT- Home , SLP- (TBD) Projected Follow-up: PT-Skilled nursing facility, OT-  24 hour supervision/assistance, SLP-24 hour supervision/assistance, Home Health SLP, Outpatient SLP, Skilled Nursing facility Projected Equipment Needs: PT-To be determined, OT- To be determined, SLP-None recommended by SLP Equipment Details: PT- , OT-  Patient/family involved in discharge planning: PT- Patient,  OT-Patient, SLP-Patient  MD ELOS: 18-20 days Medical Rehab Prognosis:  Good Assessment: The patient has been admitted for CIR therapies with the diagnosis of debility after CABG,multiple medical. The team will be addressing functional mobility, strength, stamina, balance,  safety, adaptive techniques and equipment, self-care, bowel and bladder mgt, patient and caregiver education, stamina, pain control, cognition, communication, community reintegration. Goals have been set at supervision to min assist for basic self-care, mobility and supervision for cognition.    Celesta Gentile.  Naaman Plummer, MD, [facility]      See Team Conference Notes for weekly updates to the plan of care

## 2014-11-05 NOTE — Progress Notes (Signed)
Occupational Therapy Assessment and Plan  Patient Details  Name: Travis Romero MRN: [id number] Date of Birth: Sep 29, 1950  OT Diagnosis: abnormal posture, apraxia, cognitive deficits and muscle weakness (generalized) Rehab Potential: Rehab Potential (ACUTE ONLY): Good ELOS: 18-21 days   Today's Date: 11/05/2014 OT Individual Time: 0900-1000 OT Individual Time Calculation (min): 60 min     Problem List:  Patient Active Problem List   Diagnosis Date Noted  . Physical debility 11/04/2014  . 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 Urinary retention   . Pleural effusion   . BPH (benign prostatic hypertrophy) with urinary retention   . Coronary artery disease involving native coronary artery of native heart without angina pectoris   . SOB (shortness of breath)   . Tobacco abuse   . HLD (hyperlipidemia)   . Severe peripheral arterial disease 10/18/2014  . NSTEMI (non-ST elevated myocardial infarction)   . Acute on chronic combined systolic and diastolic CHF (congestive heart failure)   . Shortness of breath   . Adjustment disorder with anxious mood   . Acute respiratory failure with hypoxia 10/13/2014  . Acute combined systolic and diastolic CHF, NYHA class 4 10/13/2014  . Acute pulmonary edema   . Endotracheal tube present   . CHF exacerbation 10/12/2014  . Benign essential HTN 10/07/2014  . BPH (benign prostatic hyperplasia) 10/07/2014  . COPD (chronic obstructive pulmonary disease) 10/07/2014  . Smoker 10/07/2014    Past Medical History:  Past Medical History  Diagnosis Date  . Hypertension   . COPD (chronic obstructive pulmonary disease)   . BPH (benign prostatic hyperplasia)   . Back pain 2007    after fall down steps   Past Surgical History:  Past Surgical History  Procedure Laterality Date  . Tonsillectomy    . Pyloromyotomy      pyloric stenosis  . Cardiac catheterization N/A 10/17/2014    Procedure: Right/Left Heart Cath and Coronary Angiography;  Surgeon: Troy Sine, MD;  Location: [place] [facility];  Service: Cardiovascular;  Laterality: N/A;  . Coronary artery bypass graft N/A 10/23/2014    Procedure:   CORONARY ARTERY BYPASS GRAFTING (CABG)X3 LIMA-LAD;SVG to OM,SVG to Distal RCA;  Surgeon: Melrose Nakayama, MD;  Location: [place];  Service: Open Heart Surgery;  Laterality: N/A;  . Tee without cardioversion N/A 10/23/2014    Procedure: TRANSESOPHAGEAL ECHOCARDIOGRAM (TEE);  Surgeon: Melrose Nakayama, MD;  Location: [place];  Service: Open Heart Surgery;  Laterality: N/A;    Assessment & Plan Clinical Impression: Travis Romero is a 64 y.o. male with history of HTN, COPD, tobacco abuse who was admitted on 10/11/14 with progressive SOB due to acute on chronic respiratory failure and was intubation at admission. He was found to have acute systolic CHF requiring aggressive diuresis and 2 D echo done revealing EF 20-25% with mild MR. 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 Post op extubated without difficulty and volume excess treated with diuresis. Foley placed due to urinary retention and he was started on IV antibiotics due ongoing hypoxia and concerns of COPD exacerbation/presumed PNA. He underwent thoracocentesis of 1.5 L of dark blood fluid on 07/26 and PCCM consulted for input. Aggressive pulmonary toilet, increase in activity as well as chest PT recommended. He underwent right thoracocentesis on 07/27 with improvement in respiratory status. Dr. Haroldine Laws was consulted for input on heart failure management and recommended entrestro bid with aggressive  diuresis and Co-ox monitoring. Digoxin added today and coreg titrated upwards. Therapy ongoing and patient noted to have difficulty with processing and sequencing, poor safety awareness as well as worsening of left hand/wrist weakness. OT working on ROM and strengthening exercise for LUE weakness.  Patient continues to be limited by sternal precautions, LUE weakness as well as deconditioning therefore CIR recommended for follow up therapy.   Patient currently requires max with basic self-care skills secondary to muscle weakness, decreased cardiorespiratoy endurance and decreased oxygen support, impaired timing and sequencing, abnormal tone, unbalanced muscle activation, motor apraxia, decreased coordination and decreased motor planning, decreased initiation and decreased sitting balance, decreased standing balance, decreased postural control and decreased balance strategies.  Prior to hospitalization, patient could complete BADLs with independent .  Patient will benefit from skilled intervention to increase independence with basic self-care skills and increase level of independence with iADL prior to discharge home with care partner.  Anticipate patient will require 24 hour supervision and follow up home health.  OT - End of Session Activity Tolerance: Decreased this session Endurance Deficit: Yes OT Assessment Rehab Potential (ACUTE ONLY): Good OT Patient demonstrates impairments in the following area(s): Balance;Safety;Cognition;Endurance;Motor OT Basic ADL's Functional Problem(s): Grooming;Bathing;Dressing OT Transfers Functional Problem(s): Tub/Shower;Toilet OT Additional Impairment(s): Fuctional Use of Upper Extremity OT Plan OT Intensity: Minimum of 1-2 x/day, 45 to 90 minutes OT Frequency: 5 out of 7 days OT Duration/Estimated Length of Stay: 18-21 days OT Treatment/Interventions: Balance/vestibular training;Discharge planning;Self Care/advanced ADL retraining;Pain management;Therapeutic Activities;UE/LE Coordination activities;Cognitive remediation/compensation;Functional mobility training;Patient/family education;Therapeutic Exercise;Community reintegration;DME/adaptive equipment instruction;Neuromuscular re-education;Psychosocial support;UE/LE Strength taining/ROM OT Self Feeding  Anticipated Outcome(s): mod I OT Basic Self-Care Anticipated Outcome(s): supervision OT Toileting Anticipated Outcome(s): supervision OT Bathroom Transfers Anticipated Outcome(s): supervision  OT Recommendation Patient destination: Home Follow Up Recommendations: 24 hour supervision/assistance Equipment Recommended: To be determined   Skilled Therapeutic Intervention OT evaluation completed. Therapist discussed role of OT, goals of therapy, possible ELOS, DME, fall risk, precaution education, transfer technique, and safety plan. OT session focused on functional transfers, sitting balance, sit<>stand, postural control, and pt education. Pt received supine in bed with RN present. Pt required mod cues for recall of sternal precautions before completing bed mobility. Pt completed supine>sit with mod A. Once in sitting, pt required min A to maintain sitting balance. Pt transferred via stand-pivot with max A and mod verbal cues for technique. Pt with noted decreased initiation and motor planning with transitional movement. Pt then completed gown change and grooming task seated in w/c due to lack of street clothing. Pt then completed 3 STS with mod-max A due to strong posterior lean/sway in standing and pt anxiety with STS. Pt remained on 2L O2 throughout session and stats WFL. Pt left seated in w/c with SLP present.   OT Evaluation Precautions/Restrictions  Precautions Precautions: Fall;Sternal Precaution Comments: watch HR  Restrictions Weight Bearing Restrictions: No General   Vital Signs Oxygen Therapy SpO2: 94 % O2 Device: Nasal Cannula O2 Flow Rate (L/min): 2 L/min Pain Pain Assessment Pain Assessment: No/denies pain Home Living/Prior Functioning Home Living Available Help at Discharge: Available PRN/intermittently Type of Home: Apartment (2-story apartment) Home Layout: Two level Alternate Level Stairs-Number of Steps: pt guesses 15 stairs  Bathroom Shower/Tub: Public librarian,  Multimedia programmer: Programmer, systems: Yes  Lives With: Alone IADL History Homemaking Responsibilities: Yes Current License: Yes Mode of TransportationOccupational psychologist Education: college graduate  Occupation: Full time employment Type of Occupation: LCSW in Scientist, 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  Prior Function Level of Independence: Independent  with basic ADLs, Independent with homemaking with ambulation  Able to Take Stairs?: Yes Driving: Yes Vocation: Full time employment Leisure: Hobbies-yes (Comment) Comments: pt enjoys movies (action) and reading ADL   Vision/Perception  Vision- History Baseline Vision/History: Wears glasses Wears Glasses: Reading only;Distance only Patient Visual Report: No change from baseline Vision- Assessment Vision Assessment?: Yes Eye Alignment: Within Functional Limits Ocular Range of Motion: Within Functional Limits Alignment/Gaze Preference: Within Defined Limits Tracking/Visual Pursuits: Able to track stimulus in all quads without difficulty Convergence: Within functional limits Visual Fields: No apparent deficits  Cognition Overall Cognitive Status: Impaired/Different from baseline Arousal/Alertness: Awake/alert Orientation Level: Person;Place;Situation Person: Oriented Place: Oriented Situation: Oriented Year: 2016 Month: August Day of Week: Correct Memory: Impaired Memory Impairment: Decreased recall of new information;Decreased short term memory Immediate Memory Recall: Sock;Blue;Bed Memory Recall: Sock;Blue;Bed Memory Recall Sock: Without Cue Memory Recall Blue: Without Cue Memory Recall Bed: Without Cue Attention: Sustained Problem Solving: Impaired Safety/Judgment: Appears intact Sensation Sensation Light Touch: Impaired Detail Hot/Cold: Appears Intact Proprioception: Impaired by gross assessment Coordination Gross Motor Movements are Fluid and Coordinated: No Fine Motor Movements are Fluid and Coordinated:  No Coordination and Movement Description: decreased initiation with movement  Finger Nose Finger Test: slow, deliberate, not fluid/steady  Motor  Motor Motor: Abnormal postural alignment and control;Motor apraxia (?ataxia ) Mobility  Bed Mobility Bed Mobility: Supine to Sit Supine to Sit: 3: Mod assist Supine to Sit Details: Verbal cues for technique;Verbal cues for precautions/safety Transfers Transfers: Sit to Stand;Stand to Sit Sit to Stand: 2: Max assist Sit to Stand Details: Verbal cues for sequencing;Verbal cues for technique;Verbal cues for precautions/safety;Manual facilitation for placement Stand to Sit: 2: Max assist Stand to Sit Details (indicate cue type and reason): Verbal cues for sequencing;Verbal cues for technique;Verbal cues for precautions/safety;Manual facilitation for placement Stand to Sit Details: fluctuates between mod-max A STS; posterior lean in standing   Trunk/Postural Assessment  Cervical Assessment Cervical Assessment: Exceptions to [facility] (forward head flexion ) Thoracic Assessment Thoracic Assessment: Exceptions to [facility] (forward curved shoulders ) Postural Control Postural Control: Deficits on evaluation (decreased postural control in standing/sitting; posterior lean/sway requiring max A )  Balance Balance Balance Assessed: Yes Static Sitting Balance Static Sitting - Balance Support: Bilateral upper extremity supported;Feet unsupported Static Sitting - Level of Assistance: 4: Min assist Dynamic Sitting Balance Dynamic Sitting - Balance Support: Feet supported (posterior lean in sitting ) Dynamic Sitting - Level of Assistance: 4: Min assist Static Standing Balance Static Standing - Level of Assistance: 3: Mod assist;2: Max assist Dynamic Standing Balance Dynamic Standing - Balance Support: During functional activity Dynamic Standing - Level of Assistance: 2: Max assist Extremity/Trunk Assessment RUE Assessment RUE Assessment: Within Functional  Limits (overall weakness, but grossly 3 to 3+/5 MMT) LUE Assessment LUE Assessment: Exceptions to [facility] (3/5 grossly; difficulty with digit extension and grip weakness; decreased fluidity with finger isolation )  FIM:  FIM - Grooming Grooming Steps: Wash, rinse, dry face;Oral care, brush teeth, clean dentures Grooming: 5: Set-up assist to obtain items FIM - Bathing Bathing Steps Patient Completed:  (pt requesting to not complete bathing on eval ) Bathing: 0: Activity did not occur FIM - Upper Body Dressing/Undressing Upper body dressing/undressing steps patient completed:  (pt did not have any street clothes; gown change ) Upper body dressing/undressing: 0: Activity did not occur FIM - Lower Body Dressing/Undressing Lower body dressing/undressing: 0: Activity did not occur FIM - Toileting Toileting: 0: Activity did not occur FIM - Bed/Chair Transfer Bed/Chair Transfer: 3:  Supine > Sit: Mod A (lifting assist/Pt. 50-74%/lift 2 legs;2: Bed > Chair or W/C: Max A (lift and lower assist)   Refer to Care Plan for Long Term Goals  Recommendations for other services: None  Discharge Criteria: Patient will be discharged from OT if patient refuses treatment 3 consecutive times without medical reason, if treatment goals not met, if there is a change in medical status, if patient makes no progress towards goals or if patient is discharged from hospital.  The above assessment, treatment plan, treatment alternatives and goals were discussed and mutually agreed upon: by patient  Dorann Ou 11/05/2014, 11:24 AM

## 2014-11-05 NOTE — Evaluation (Signed)
Speech Language Pathology Assessment and Plan  Patient Details  Name: Travis Romero MRN: [id number] Date of Birth: 12/26/1950  SLP Diagnosis: Cognitive Impairments  Rehab Potential: Excellent ELOS: 11/26/14    Today's Date: 11/05/2014 SLP Individual Time: 1000-1100 SLP Individual Time Calculation (min): 60 min   Problem List:  Patient Active Problem List   Diagnosis Date Noted  . Physical debility 11/04/2014  . 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 Urinary retention   . Pleural effusion   . BPH (benign prostatic hypertrophy) with urinary retention   . Coronary artery disease involving native coronary artery of native heart without angina pectoris   . SOB (shortness of breath)   . Tobacco abuse   . HLD (hyperlipidemia)   . Severe peripheral arterial disease 10/18/2014  . NSTEMI (non-ST elevated myocardial infarction)   . Acute on chronic combined systolic and diastolic CHF (congestive heart failure)   . Shortness of breath   . Adjustment disorder with anxious mood   . Acute respiratory failure with hypoxia 10/13/2014  . Acute combined systolic and diastolic CHF, NYHA class 4 10/13/2014  . Acute pulmonary edema   . Endotracheal tube present   . CHF exacerbation 10/12/2014  . Benign essential HTN 10/07/2014  . BPH (benign prostatic hyperplasia) 10/07/2014  . COPD (chronic obstructive pulmonary disease) 10/07/2014  . Smoker 10/07/2014   Past Medical History:  Past Medical History  Diagnosis Date  . Hypertension   . COPD (chronic obstructive pulmonary disease)   . BPH (benign prostatic hyperplasia)   . Back pain 2007    after fall down steps   Past Surgical History:  Past Surgical History  Procedure Laterality Date  . Tonsillectomy    . Pyloromyotomy      pyloric stenosis  . Cardiac catheterization N/A 10/17/2014    Procedure: Right/Left Heart Cath and Coronary Angiography;  Surgeon: Troy Sine, MD;  Location: [facility];  Service: Cardiovascular;  Laterality:  N/A;  . Coronary artery bypass graft N/A 10/23/2014    Procedure:   CORONARY ARTERY BYPASS GRAFTING (CABG)X3 LIMA-LAD;SVG to OM,SVG to Distal RCA;  Surgeon: Melrose Nakayama, MD;  Location: [facility];  Service: Open Heart Surgery;  Laterality: N/A;  . Tee without cardioversion N/A 10/23/2014    Procedure: TRANSESOPHAGEAL ECHOCARDIOGRAM (TEE);  Surgeon: Melrose Nakayama, MD;  Location: [facility];  Service: Open Heart Surgery;  Laterality: N/A;    Assessment / Plan / Recommendation Clinical Impression Patient is a 64 y.o. male with history of HTN, COPD, tobacco abuse who was admitted on 10/11/14 with progressive SOB due to acute on chronic respiratory failure and was intubated at admission. He was found to have acute systolic CHF requiring aggressive diuresis and 2 D echo done revealing EF 20-25% with mild MR. Cardiac cath done on 07/14 revealed severe 3V CAD with severe LV dysfunction and patient underwent CABG X 3 on 07/21 by Dr.Hendrickson.  Post op extubated without difficulty and volume excess treated with diuresis. Foley placed due to urinary retention and he was started on IV antibiotics due ongoing hypoxia and concerns of COPD exacerbation/presumed PNA.   He underwent thoracocentesis of 1.5 L of dark blood fluid on 07/26 and PCCM consulted for input.  Aggressive pulmonary toilet, increase in activity as well as chest PT recommended. He underwent right thoracocentesis on 07/27 with improvement in respiratory status. Therapy ongoing and patient noted to have difficulty with processing and sequencing, poor safety awareness as well as worsening of left hand/wrist weakness.  OT working on ROM and strengthening exercise for LUE weakness. Patient continues to be limited by sternal precautions, LUE weakness as well as deconditioning therefore CIR recommended for follow up therapy. Patient admitted to CIR on 11/04/14 and demonstrates moderate cognitive impairments impacting attention, recall, problem solving and  awareness which impacts his safety with functional tasks. Patient also demonstrates a hoarse vocal quality, suspect due to multiple and prolonged intubation. Patient would benefit from skilled SLP intervention to maximize cognitive function and overall functional independence prior to discharge.   Skilled Therapeutic Interventions          Administered a cognitive-linguistic evaluation, please see above for details. Also administered the MoCA, patient scored 15/22 points with a score of 18 or above considered normal. Educated patient in regards to his current cognitive function and goals of skilled SLP intervention, he verbalized understanding.   SLP Assessment  Patient will need skilled [facility] during CIR admission    Recommendations  Oral Care Recommendations: Oral care BID Recommendations for Other Services: Neuropsych consult Patient destination:  (TBD) Follow up Recommendations: 24 hour supervision/assistance;Home Health SLP;Outpatient SLP;Skilled Nursing facility Equipment Recommended: None recommended by SLP    SLP Frequency 3 to 5 out of 7 days   SLP Treatment/Interventions Cognitive remediation/compensation;Cueing hierarchy;Functional tasks;Environmental controls;Internal/external aids;Patient/family education;Therapeutic Activities;Speech/Language facilitation    Pain Pain Assessment Pain Assessment: 0-10 Pain Score: 8  Pain Location: Back (all over) Pain Orientation: Mid;Upper Pain Descriptors / Indicators: Aching Pain Onset: Gradual Pain Intervention(s): Repositioned Prior Functioning Type of Home: Apartment (2-story apartment)  Lives With: Alone Available Help at Discharge: Available PRN/intermittently Vocation: Full time employment  Short Term Goals: Week 1: SLP Short Term Goal 1 (Week 1): Patient will utilize external memory aids to recall new, daily information with Min A multimodal cues. SLP Short Term Goal 2 (Week 1): Patient will  demonstrate functional problem for basic and familiar tasks with Min A multimodal cues.  SLP Short Term Goal 3 (Week 1): Patient will demonstrate sustained attention to a functional task for 60 minutes with Min A verbal cues for redirection.  SLP Short Term Goal 4 (Week 1): Patient will self-monitor and correct errors with functional tasks with Min A verbal and question cues.  SLP Short Term Goal 5 (Week 1): Patient will utilize an increased vocal intensity at the phrase level with Min A verbal cues to increase intelligibility to 100%.   See FIM for current functional status Refer to Care Plan for Long Term Goals  Recommendations for other services: Neuropsych  Discharge Criteria: Patient will be discharged from SLP if patient refuses treatment 3 consecutive times without medical reason, if treatment goals not met, if there is a change in medical status, if patient makes no progress towards goals or if patient is discharged from hospital.  The above assessment, treatment plan, treatment alternatives and goals were discussed and mutually agreed upon: by patient  Euclide Granito 11/05/2014, 5:03 PM

## 2014-11-05 NOTE — Plan of Care (Signed)
Problem: RH PAIN MANAGEMENT Goal: RH STG PAIN MANAGED AT OR BELOW PT'S PAIN GOAL <5  Outcome: Not Progressing Rates pain as 7/8

## 2014-11-05 NOTE — Evaluation (Signed)
Physical Therapy Assessment and Plan  Patient Details  Name: Travis Romero MRN: [id number] Date of Birth: 10-Dec-1950  PT Diagnosis: Abnormal posture, Abnormality of gait, Ataxia, Cognitive deficits, Coordination disorder, Difficulty walking, Low back pain and Muscle weakness. Rehab Potential: Good ELOS: 14-20 days   Today's Date: 11/05/2014 PT Individual Time: 1300-1430 PT Individual Time Calculation (min): 90 min    Problem List:  Patient Active Problem List   Diagnosis Date Noted  . Physical debility 11/04/2014  . 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 Urinary retention   . Pleural effusion   . BPH (benign prostatic hypertrophy) with urinary retention   . Coronary artery disease involving native coronary artery of native heart without angina pectoris   . SOB (shortness of breath)   . Tobacco abuse   . HLD (hyperlipidemia)   . Severe peripheral arterial disease 10/18/2014  . NSTEMI (non-ST elevated myocardial infarction)   . Acute on chronic combined systolic and diastolic CHF (congestive heart failure)   . Shortness of breath   . Adjustment disorder with anxious mood   . Acute respiratory failure with hypoxia 10/13/2014  . Acute combined systolic and diastolic CHF, NYHA class 4 10/13/2014  . Acute pulmonary edema   . Endotracheal tube present   . CHF exacerbation 10/12/2014  . Benign essential HTN 10/07/2014  . BPH (benign prostatic hyperplasia) 10/07/2014  . COPD (chronic obstructive pulmonary disease) 10/07/2014  . Smoker 10/07/2014    Past Medical History:  Past Medical History  Diagnosis Date  . Hypertension   . COPD (chronic obstructive pulmonary disease)   . BPH (benign prostatic hyperplasia)   . Back pain 2007    after fall down steps   Past Surgical History:  Past Surgical History  Procedure Laterality Date  . Tonsillectomy    . Pyloromyotomy      pyloric stenosis  . Cardiac catheterization N/A 10/17/2014    Procedure: Right/Left Heart Cath and Coronary  Angiography;  Surgeon: Troy Sine, MD;  Location: [facility];  Service: Cardiovascular;  Laterality: N/A;  . Coronary artery bypass graft N/A 10/23/2014    Procedure:   CORONARY ARTERY BYPASS GRAFTING (CABG)X3 LIMA-LAD;SVG to OM,SVG to Distal RCA;  Surgeon: Melrose Nakayama, MD;  Location: [facility];  Service: Open Heart Surgery;  Laterality: N/A;  . Tee without cardioversion N/A 10/23/2014    Procedure: TRANSESOPHAGEAL ECHOCARDIOGRAM (TEE);  Surgeon: Melrose Nakayama, MD;  Location: [facility];  Service: Open Heart Surgery;  Laterality: N/A;    Assessment & Plan Clinical Impression: Travis Romero is a 64 y.o. male with history of HTN, COPD, tobacco abuse who was admitted on 10/11/14 with progressive SOB due to acute on chronic respiratory failure and was intubation at admission. He was found to have acute systolic CHF requiring aggressive diuresis and 2 D echo done revealing EF 20-25% with mild MR. 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 Post op extubated without difficulty and volume excess treated with diuresis. Foley placed due to urinary retention and he was started on IV antibiotics due ongoing hypoxia and concerns of COPD exacerbation/presumed PNA. He underwent thoracocentesis of 1.5 L of dark blood fluid on 07/26 and PCCM consulted for input. Aggressive pulmonary toilet, increase in activity as well as chest PT recommended. He underwent right thoracocentesis on 07/27 with improvement in respiratory status. Dr. Haroldine Laws was consulted for input on heart failure management and recommended  entrestro bid with aggressive diuresis and Co-ox monitoring. Digoxin added today and coreg titrated upwards. Therapy ongoing and patient noted to have difficulty with processing and sequencing, poor safety awareness as well as worsening of left hand/wrist weakness. OT working on ROM and strengthening  exercise for LUE weakness. Patient continues to be limited by sternal precautions, LUE weakness as well as deconditioning therefore CIR recommended for follow up therapy. .  Patient transferred to CIR on 11/04/2014 .   Patient currently requires mod to max with mobility secondary to muscle weakness and muscle joint tightness, decreased cardiorespiratoy endurance and decreased oxygen support, impaired timing and sequencing, motor apraxia, ataxia, decreased coordination and decreased motor planning and decreased initiation, decreased attention, decreased awareness, decreased problem solving and decreased safety awareness.  Prior to hospitalization, patient was independent  with mobility and lived with Alone in a Apartment (2-story apartment) home.  Home access is 15Stairs to enter (pt's apartment is on second floor).  Patient will benefit from skilled PT intervention to maximize safe functional mobility, minimize fall risk and decrease caregiver burden for planned discharge home if 24/7 supervision or SNF.  Anticipate patient will benefit from benefit from follow up [facility] if 24/7 supervision is available, or SNF at discharge.  PT - End of Session Activity Tolerance: Decreased this session Endurance Deficit: Yes Endurance Deficit Description: pt requires frequent rest breaks, and has poor standing endurance PT Assessment Rehab Potential (ACUTE/IP ONLY): Good Barriers to Discharge: Inaccessible home environment;Decreased caregiver support (pt has 15 steps to enter home) PT Plan PT Intensity: Minimum of 1-2 x/day ,45 to 90 minutes PT Frequency: 5 out of 7 days PT Duration Estimated Length of Stay: 14-20 days PT Treatment/Interventions: Ambulation/gait training;Balance/vestibular training;Cognitive remediation/compensation;Discharge planning;Community reintegration;DME/adaptive equipment instruction;Functional mobility training;Therapeutic Activities;UE/LE Strength taining/ROM;Wheelchair  propulsion/positioning;UE/LE Coordination activities;Therapeutic Exercise;Stair training;Patient/family education;Neuromuscular re-education PT Recommendation Follow Up Recommendations: Skilled nursing facility;24 hour supervision/assistance;Home health PT Patient destination: Home (SNF if no 24/7 assistance available) Equipment Recommended: To be determined  Skilled Therapeutic Intervention Session focused on activity tolerance, and functional mobility. Discussed with pt falls risk, safety in room, and focus of therapy.Pt able to recall sternal precautions except lifting UE's overhead. Pt complained of constipation, and was able to have successful bowel movement in more than reasonable amount of time with max assist transfer from wheelchair to toilet with one UE placed on grab bar, pt required total assist for hygeine . Pt requires overall mod to max A for transfers despite good strength in LE's in MMT. Pt demonstrates poor movement patterns, and trunk control, demonstrating retropulsion in standing and with transfers. Plan to incorporate dynamic balance, gait training, stair training, activity tolerance, postural control, NMR, and functional mobility to address impairments in balance, activity tolerance, coordination, and motor planning. Recommending stedy with min to mod A and cues to not pull with UE's due to precautions, with nursing staff notified. Pt returned to bed with HOB elevated, bed alarm set, and all needs in reach.  PT Evaluation Precautions/Restrictions Precautions Precautions: Fall;Sternal Precaution Comments: watch HR  Restrictions Weight Bearing Restrictions: No General Chart Reviewed: Yes Family/Caregiver Present: No Vital SignsTherapy Vitals Temp: 97.7 F (36.5 C) Temp Source: Oral Pulse Rate: (!) 115 (after activity) Resp: 16 BP: 114/70 mmHg (after activity) Patient Position (if appropriate): Lying Oxygen Therapy SpO2: 98 % (after activity, with supplemental O2) O2  Device: Nasal Cannula O2 Flow Rate (L/min): 2 L/min Pain Pain Assessment Pain Assessment: 0-10 Pain Score: 8  Pain Location: Back (all over) Pain Orientation: Mid;Upper Pain  Descriptors / Indicators: Aching Pain Onset: Gradual Pain Intervention(s): Repositioned Home Living/Prior Functioning Home Living Available Help at Discharge: Available PRN/intermittently Type of Home: Apartment (2-story apartment) Home Access: Stairs to enter (pt's apartment is on second floor) Technical brewer of Steps: 15 Entrance Stairs-Rails: Can reach both Home Layout: One level Bathroom Shower/Tub: Chiropodist: Standard Bathroom Accessibility: Yes  Lives With: Alone Prior Function Level of Independence: Independent with basic ADLs;Independent with homemaking with ambulation;Independent with homemaking with wheelchair;Independent with gait;Independent with transfers  Able to Take Stairs?: Yes Driving: Yes 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 Leisure: Hobbies-yes (Comment) Comments: pt enjoys movies (action) and reading Vision/Perception  No change from baseline, pt wears glasses for reading  Cognition Overall Cognitive Status: Impaired/Different from baseline Arousal/Alertness: Awake/alert Orientation Level: Oriented X4 Attention: Sustained Sustained Attention: Impaired Sustained Attention Impairment: Functional basic Memory: Impaired Memory Impairment: Decreased recall of new information;Decreased short term memory Decreased Short Term Memory: Verbal basic;Functional basic Awareness: Impaired Awareness Impairment: Emergent impairment Problem Solving: Impaired Problem Solving Impairment: Verbal basic;Functional basic Safety/Judgment: Appears intact Sensation Sensation Light Touch: Appears Intact Proprioception: Impaired by gross assessment Additional Comments: pt not always aware of positioning of LE's in standing Coordination Gross Motor  Movements are Fluid and Coordinated: No Fine Motor Movements are Fluid and Coordinated: No Coordination and Movement Description: decreased initiation with movement, inconsistent movement velocities Heel Shin Test: inconsistent velocity and or accuracy Motor  Motor Motor: Motor apraxia;Abnormal postural alignment and control Motor - Skilled Clinical Observations: retropulsion, cant maintain erect posture in standing  Mobility Bed Mobility Bed Mobility: Supine to Sit Supine to Sit: 3: Mod assist Supine to Sit Details: Verbal cues for technique;Verbal cues for precautions/safety;Verbal cues for sequencing Transfers Transfers: Yes Sit to Stand: 2: Max assist;3: Mod assist Sit to Stand Details: Verbal cues for sequencing;Verbal cues for technique;Verbal cues for precautions/safety;Manual facilitation for weight shifting Sit to Stand Details (indicate cue type and reason): flucuates between mod to max A Stand to Sit: 3: Mod assist;2: Max assist Stand to Sit Details (indicate cue type and reason): Verbal cues for sequencing;Verbal cues for technique;Verbal cues for precautions/safety;Manual facilitation for weight shifting Stand to Sit Details: flucuates between mod to max A, retropulsion Stand Pivot Transfers: 2: Max assist Stand Pivot Transfer Details: Verbal cues for precautions/safety;Verbal cues for sequencing;Verbal cues for technique;Verbal cues for gait pattern;Manual facilitation for weight shifting Stand Pivot Transfer Details (indicate cue type and reason): retropulsion, pt needs cuing for feet placement and sequence. Locomotion  Ambulation Ambulation/Gait Assistance: 3: Mod assist Ambulation Distance (Feet): 11 Feet Gait Gait: Yes Gait Pattern: Impaired Gait Pattern: Decreased step length - right;Decreased step length - left;Shuffle;Ataxic Gait velocity: very slow Stairs / Additional Locomotion Stairs: Yes Stairs Assistance: 1: +2 Total assist Stairs Assistance Details:  Verbal cues for technique;Verbal cues for gait pattern;Verbal cues for precautions/safety;Verbal cues for sequencing Stairs Assistance Details (indicate cue type and reason): one person in front to facilitate anterior weight shift, one person behind with chair Stair Management Technique: Two rails;Step to pattern Number of Stairs: 1 Height of Stairs: 3 Ramp: Not tested (comment) Curb: Not tested (comment) Architect: Yes Wheelchair Assistance: 5: Supervision Wheelchair Assistance Details: Verbal cues for sequencing;Visual cues/gestures for sequencing;Verbal cues for Information systems manager: Both lower extermities Wheelchair Parts Management: Needs assistance (pt needs assistance with brakes, due to sternal precautions) Distance: 50 ft  Trunk/Postural Assessment  Cervical Assessment Cervical Assessment: Exceptions to [facility] (forward head posture) Thoracic Assessment Thoracic Assessment: Exceptions to  WFL (forward shoulders and kyphosis) Lumbar Assessment Lumbar Assessment: Exceptions to [facility] (slumped posture) Postural Control Postural Control: Deficits on evaluation (retropulsion in standing, cant maintain erect trunk posture)  Balance Balance Balance Assessed: Yes Static Sitting Balance Static Sitting - Balance Support: No upper extremity supported;Feet supported Static Sitting - Level of Assistance: 5: Stand by assistance Static Sitting - Comment/# of Minutes: 10 Dynamic Sitting Balance Dynamic Sitting - Balance Support: Feet supported;No upper extremity supported Dynamic Sitting - Level of Assistance: 5: Stand by assistance Dynamic Sitting - Balance Activities: Reaching for objects Sitting balance - Comments: pt able to perform reaches in all directions Static Standing Balance Static Standing - Balance Support: During functional activity Static Standing - Level of Assistance: 3: Mod assist;2: Max assist Static Standing - Comment/# of  Minutes: <1  Dynamic Standing Balance Dynamic Standing - Balance Support: During functional activity Dynamic Standing - Level of Assistance: 2: Max assist Extremity Assessment      RLE Assessment RLE Assessment: Within Functional Limits RLE Strength RLE Overall Strength: Within Functional Limits for tasks assessed (overall 4+/5 MMT) LLE Assessment LLE Assessment: Within Functional Limits LLE Strength LLE Overall Strength: Within Functional Limits for tasks assessed (overall 4+/5 MMT)  FIM:  FIM - Bed/Chair Transfer Bed/Chair Transfer Assistive Devices: Bed rails Bed/Chair Transfer: 3: Supine > Sit: Mod A (lifting assist/Pt. 50-74%/lift 2 legs;4: Sit > Supine: Min A (steadying pt. > 75%/lift 1 leg);2: Bed > Chair or W/C: Max A (lift and lower assist);2: Chair or W/C > Bed: Max A (lift and lower assist) FIM - Locomotion: Wheelchair Distance: 50 ft Locomotion: Wheelchair: 2: Travels 50 - 149 ft with supervision, cueing or coaxing (LE's only) FIM - Locomotion: Ambulation Locomotion: Ambulation Assistive Devices: Other (comment) (no AD, UE support on shoulders of PT) Ambulation/Gait Assistance: 3: Mod assist Locomotion: Ambulation: 1: Travels less than 50 ft with moderate assistance (Pt: 50 - 74%) FIM - Locomotion: Stairs Locomotion: Scientist, physiological: Insurance account manager - 2 Locomotion: Stairs: 1: Two helpers   Refer to [place] for Long Term Goals  Recommendations for other services: None  Discharge Criteria: Patient will be discharged from PT if patient refuses treatment 3 consecutive times without medical reason, if treatment goals not met, if there is a change in medical status, if patient makes no progress towards goals or if patient is discharged from hospital.  The above assessment, treatment plan, treatment alternatives and goals were discussed and mutually agreed upon: by patient  Elsie Ra 11/05/2014, 5:03 PM

## 2014-11-06 ENCOUNTER — Inpatient Hospital Stay (HOSPITAL_COMMUNITY): Payer: PRIVATE HEALTH INSURANCE | Admitting: Speech Pathology

## 2014-11-06 ENCOUNTER — Inpatient Hospital Stay (HOSPITAL_COMMUNITY): Payer: PRIVATE HEALTH INSURANCE | Admitting: Physical Therapy

## 2014-11-06 ENCOUNTER — Inpatient Hospital Stay (HOSPITAL_COMMUNITY): Payer: PRIVATE HEALTH INSURANCE

## 2014-11-06 DIAGNOSIS — Z951 Presence of aortocoronary bypass graft: Secondary | ICD-10-CM

## 2014-11-06 LAB — BASIC METABOLIC PANEL
Anion gap: 7 (ref 5–15)
BUN: 27 mg/dL — AB (ref 6–20)
CO2: 34 mmol/L — ABNORMAL HIGH (ref 22–32)
Calcium: 9.1 mg/dL (ref 8.9–10.3)
Chloride: 95 mmol/L — ABNORMAL LOW (ref 101–111)
Creatinine, Ser: 1.26 mg/dL — ABNORMAL HIGH (ref 0.61–1.24)
GFR calc Af Amer: >60 mL/min (ref >60)
GFR, EST NON AFRICAN AMERICAN: 59 mL/min — AB (ref >60)
GLUCOSE: 104 mg/dL — AB (ref 65–99)
Potassium: 4.7 mmol/L (ref 3.5–5.1)
Sodium: 136 mmol/L (ref 135–145)

## 2014-11-06 LAB — CBC
HEMATOCRIT: 33.2 % — AB (ref 39.0–52.0)
HEMOGLOBIN: 10.5 g/dL — AB (ref 13.0–17.0)
MCH: 34.5 pg — AB (ref 26.0–34.0)
MCHC: 31.6 g/dL (ref 30.0–36.0)
MCV: 109.2 fL — ABNORMAL HIGH (ref 78.0–100.0)
Platelets: 430 10*3/uL — ABNORMAL HIGH (ref 150–400)
RBC: 3.04 MIL/uL — ABNORMAL LOW (ref 4.22–5.81)
RDW: 14.7 % (ref 11.5–15.5)
WBC: 12.1 10*3/uL — AB (ref 4.0–10.5)

## 2014-11-06 LAB — GLUCOSE, CAPILLARY
GLUCOSE-CAPILLARY: 120 mg/dL — AB (ref 65–99)
Glucose-Capillary: 142 mg/dL — ABNORMAL HIGH (ref 65–99)
Glucose-Capillary: 113 mg/dL — ABNORMAL HIGH (ref 65–99)
Glucose-Capillary: 125 mg/dL — ABNORMAL HIGH (ref 65–99)

## 2014-11-06 LAB — URINE CULTURE: CULTURE: NO GROWTH

## 2014-11-06 MED ORDER — FUROSEMIDE 40 MG PO TABS
40.0000 mg | ORAL_TABLET | Freq: Every day | ORAL | Status: DC
  Administered 2014-11-06 – 2014-11-26 (×20): 40 mg via ORAL
  Filled 2014-11-06 (×19): qty 1

## 2014-11-06 NOTE — Patient Care Conference (Signed)
Inpatient RehabilitationTeam Conference and Plan of Care Update Date: 11/05/2014   Time: 2:35 PM    Patient Name: Travis Romero      Medical Record Number: [id number]  Date of Birth: Mar 09, 1951 Sex: Male         Room/Bed: 4W18C/4W18C-01 Payor Info: Payor: [employer]ST / Plan: [employer]COST / Product Type: *No Product type* /    Admitting Diagnosis: Debility after resp failure  CHF and CABG  Admit Date/Time:  11/04/2014  2:44 PM Admission Comments: No comment available   Primary Diagnosis:  Physical debility Principal Problem: Physical debility  Patient Active Problem List   Diagnosis Date Noted  . Physical debility 11/04/2014  . 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 Urinary retention   . Pleural effusion   . BPH (benign prostatic hypertrophy) with urinary retention   . Coronary artery disease involving native coronary artery of native heart without angina pectoris   . SOB (shortness of breath)   . Tobacco abuse   . HLD (hyperlipidemia)   . Severe peripheral arterial disease 10/18/2014  . NSTEMI (non-ST elevated myocardial infarction)   . Acute on chronic combined systolic and diastolic CHF (congestive heart failure)   . Shortness of breath   . Adjustment disorder with anxious mood   . Acute respiratory failure with hypoxia 10/13/2014  . Acute combined systolic and diastolic CHF, NYHA class 4 10/13/2014  . Acute pulmonary edema   . Endotracheal tube present   . CHF exacerbation 10/12/2014  . Benign essential HTN 10/07/2014  . BPH (benign prostatic hyperplasia) 10/07/2014  . COPD (chronic obstructive pulmonary disease) 10/07/2014  . Smoker 10/07/2014    Expected Discharge Date: Expected Discharge Date: 11/26/14  Team Members Present: Physician leading conference: Dr. Alger Simons Social Worker Present: Lennart Pall, LCSW Nurse Present: Heather Roberts, RN PT Present: Carney Living, PT OT Present: Roanna Epley, Griffin Basil, OT SLP Present: Weston Anna, SLP Other (Discipline and  Name): Danne Baxter, RN [facility]) PPS Coordinator present : Daiva Nakayama, RN, CRRN     Current Status/Progress Goal Weekly Team Focus  Medical   severe CAD s/p CABG, deconditionined, balancing volume,bp,   improve activity weakness.   nutrition, cv mgt, skin care   Bowel/Bladder   Continent of bowel. LBM 11/03/14. Foley to straight drain w/clear yellow urine  Managed bowel and bladder  Monitor for regular bowel movement. Assess removal of foley catheter   Swallow/Nutrition/ Hydration             ADL's   mod-max A STS; grooming set-up; mod A UB, max A LB  min A-supervision   sit<>stand, general strengthening/endurance, ADL retraining, functional transfers, sitting balance, standing balance, pt education, safety awareness   Mobility   min to mod A sit to stand, mod to max A for transfers, min assist amubulation 11 feet, max A stairs, min A bed mobility  min A to supervision  maintaining sternal precautions, activity tolerance, monitor HR, dynamic standing balance, functional mobility, postural control to decrease retropulsion and allow anterior weight shifting   Communication   Hoarse vocal quality, Min A  Mod I   incrased vocal intensity for increased speech intelligibility    Safety/Cognition/ Behavioral Observations  Mod A   Supervision  recall, problem solving, awareness, safety    Pain   Tradamol 50mg  q 6hrs prn, Oxy IR 5-10mg  q 4hrs for discomfort  <4  Monitor for nonverbal cues of pain   Skin   CABG incision to sternum, upper chest, and R saphaneous vein  CDI, OTA     Sacrum pink, blanchable  No additional skin breakdown  Assess skin q shift. Encourage turn q 2hrs to decrease pressure to sacrum area    Rehab Goals Patient on target to meet rehab goals: Yes *See Care Plan and progress notes for long and short-term goals.  Barriers to Discharge: pain, weakneess, cardio tolerance    Possible Resolutions to Barriers:  continued CV training, strengthening    Discharge  Planning/Teaching Needs:  pt to d/c to his own apartment with sister able to stay with him x 1 week  closer to d/c   Team Discussion:  Multiple medical issues for this new evalution.  Poor recall, awareness and problem solving skills.  Left UE inpaired - ?reason - MD to follow up.  Currently ambulating with min assist.  Goals for supervision/ min overall.  Poor support following d/c as sister can only stay a few days.  May need to consider SNF if limited progress made.  Revisions to Treatment Plan:  None   Continued Need for Acute Rehabilitation Level of Care: The patient requires daily medical management by a physician with specialized training in physical medicine and rehabilitation for the following conditions: Daily direction of a multidisciplinary physical rehabilitation program to ensure safe treatment while eliciting the highest outcome that is of practical value to the patient.: Yes Daily medical management of patient stability for increased activity during participation in an intensive rehabilitation regime.: Yes Daily analysis of laboratory values and/or radiology reports with any subsequent need for medication adjustment of medical intervention for : Post surgical problems;Neurological problems  Travis Romero 11/06/2014, 9:33 AM

## 2014-11-06 NOTE — Progress Notes (Signed)
Advanced Heart Failure Rounding Note   Subjective:    In rehab. Feels like he is getting stronger. Still diuresing slowly. Creatinine up just a bit. No CP. Appetite good. Concerned about hoarseness.    Objective:   Weight Range:  Vital Signs:   Temp:  [98.7 F (37.1 C)] 98.7 F (37.1 C) (08/03 0547) Pulse Rate:  [94-104] 94 (08/03 2016) Resp:  [16-18] 18 (08/03 2016) BP: (107-148)/(58-76) 107/58 mmHg (08/03 1530) SpO2:  [93 %-98 %] 97 % (08/03 2016) FiO2 (%):  [2 %] 2 % (08/03 1530) Weight:  [56.8 kg (125 lb 3.5 oz)] 56.8 kg (125 lb 3.5 oz) (08/03 0547) Last BM Date: 11/05/14  Weight change: Filed Weights   11/04/14 1448 11/05/14 0529 11/06/14 0547  Weight: 61.417 kg (135 lb 6.4 oz) 58.4 kg (128 lb 12 oz) 56.8 kg (125 lb 3.5 oz)    Intake/Output:   Intake/Output Summary (Last 24 hours) at 11/06/14 2027 Last data filed at 11/06/14 1833  Gross per 24 hour  Intake    480 ml  Output   1500 ml  Net  -1020 ml     Physical Exam: General: . No resp difficulty. Sitting in the chair eating. Hoarse voice. HEENT: normal Neck: supple. JVP 9-10. Carotids 2+ bilat; no bruits. No lymphadenopathy or thryomegaly appreciated. Cor: Sternal wound ok. Regular rate & rhythm.  Lungs: decreased BS throughout Abdomen: soft, nontender, nondistended. No hepatosplenomegaly. No bruits or masses. Good bowel sounds. Extremities: no cyanosis, clubbing, rash. Chronic venous stasis changes with 1+ edemedema. RUE PICC Neuro: alert & orientedx3, cranial nerves grossly intact. moves all 4 extremities w/o difficulty. Affect pleasant     Labs: Basic Metabolic Panel:  Recent Labs Lab 11/01/14 0219 11/03/14 0358 11/04/14 0911 11/05/14 0558 11/06/14 0535  NA 136 136 134* 133* 136  K 4.3 4.6 4.5 4.5 4.7  CL 95* 95* 92* 90* 95*  CO2 35* 35* 34* 34* 34*  GLUCOSE 117* 99 172* 105* 104*  BUN 17 18 23* 23* 27*  CREATININE 1.22 1.05 1.13 1.09 1.26*  CALCIUM 8.6* 8.9 9.1 8.9 9.1    Liver Function  Tests:  Recent Labs Lab 11/05/14 0558  AST 19  ALT 23  ALKPHOS 93  BILITOT 0.5  PROT 5.4*  ALBUMIN 2.2*   No results for input(s): LIPASE, AMYLASE in the last 168 hours. No results for input(s): AMMONIA in the last 168 hours.  CBC:  Recent Labs Lab 10/31/14 0231 11/01/14 0219 11/03/14 0358 11/05/14 0558 11/06/14 0535  WBC 12.2* 11.0* 10.7* 11.5* 12.1*  NEUTROABS  --   --   --  7.8*  --   HGB 9.1* 9.1* 10.4* 10.7* 10.5*  HCT 28.4* 27.7* 32.9* 33.6* 33.2*  MCV 111.4* 110.8* 108.6* 109.1* 109.2*  PLT 470* 477* 500* 451* 430*    Cardiac Enzymes: No results for input(s): CKTOTAL, CKMB, CKMBINDEX, TROPONINI in the last 168 hours.  BNP: BNP (last 3 results)  Recent Labs  10/12/14 1337 10/29/14 0812 11/03/14 0358  BNP 981.7* 1031.0* 699.7*    ProBNP (last 3 results) No results for input(s): PROBNP in the last 8760 hours.    Other results:  Imaging:  No results found.   Medications:     Scheduled Medications: . arformoterol  15 mcg Nebulization BID  . aspirin EC  325 mg Oral Daily   Or  . aspirin  324 mg Per Tube Daily  . atorvastatin  80 mg Oral q1800  . budesonide (PULMICORT) nebulizer solution  0.5  mg Nebulization BID  . carvedilol  9.375 mg Oral BID WC  . digoxin  0.125 mg Oral Daily  . dutasteride  0.5 mg Oral Daily  . enoxaparin (LOVENOX) injection  40 mg Subcutaneous Q24H  . feeding supplement (PRO-STAT SUGAR FREE 64)  30 mL Oral BID  . [START ON 11/08/2014] furosemide  40 mg Oral Daily  . hydrocerin   Topical BID  . insulin aspart  0-9 Units Subcutaneous TID WC  . mirtazapine  45 mg Oral QHS  . nicotine  21 mg Transdermal Daily  . pantoprazole  40 mg Oral Daily  . sacubitril-valsartan  1 tablet Oral BID  . senna-docusate  2 tablet Oral QHS  . sodium chloride  10-40 mL Intracatheter Q12H  . spironolactone  25 mg Oral Daily     Infusions:     PRN Medications:  acetaminophen, ALPRAZolam, alum & mag hydroxide-simeth, benzonatate,  bisacodyl, guaiFENesin-dextromethorphan, levalbuterol, methocarbamol, ondansetron **OR** ondansetron (ZOFRAN) IV, oxyCODONE, sodium chloride, traMADol, traZODone   Assessment:   1. ICM---> CABG x3 7/20 IMA-LAD;SVG to OM,SVG to Distal RCA (N/A). On statin, bb, aspirin  2. Acute Systolic Heart Failure- ECHO 10/12/14 EF 20% ICM per [facility]  3. Physical deconditioning 4. Severe protein calorie malnutrition  5. Hoarseness  Plan/Discussion:    Remains weak but making some progress. Volume status still mildly elevated. Will continue current regimen. Follow renal function closely. Appreciate CIR's care.    Length of Stay: 2   Glori Bickers  MD  11/06/2014, 8:27 PM  Advanced Heart Failure Team Pager [phone] (M-F; 7a - 4p)  Please contact [facility] for night-coverage after hours (4p -7a ) and weekends on [email or web address]

## 2014-11-06 NOTE — Progress Notes (Signed)
[facility] PHYSICAL MEDICINE & REHABILITATION     PROGRESS NOTE    Subjective/Complaints: Had a pretty good night. No cough/sob/wheezing. Slept well.   ROS: Pt denies fever, rash/itching, headache, blurred or double vision, nausea, vomiting, abdominal pain, diarrhea, chest pain,  palpitations, dysuria, dizziness, neck or back pain, bleeding, anxiety, or depression   Objective: Vital Signs: Blood pressure 145/75, pulse 104, temperature 98.7 F (37.1 C), temperature source Oral, resp. rate 16, height 5\' 7"  (1.702 m), weight 56.8 kg (125 lb 3.5 oz), SpO2 93 %. No results found.  Recent Labs  11/05/14 0558 11/06/14 0535  WBC 11.5* 12.1*  HGB 10.7* 10.5*  HCT 33.6* 33.2*  PLT 451* 430*    Recent Labs  11/05/14 0558 11/06/14 0535  NA 133* 136  K 4.5 4.7  CL 90* 95*  GLUCOSE 105* 104*  BUN 23* 27*  CREATININE 1.09 1.26*  CALCIUM 8.9 9.1   CBG (last 3)   Recent Labs  11/05/14 1628 11/05/14 2040 11/06/14 0644  GLUCAP 125* 115* 120*    Wt Readings from Last 3 Encounters:  11/06/14 56.8 kg (125 lb 3.5 oz)  11/04/14 56.2 kg (123 lb 14.4 oz)  10/07/14 68.493 kg (151 lb)    Physical Exam:  Constitutional: He is oriented to person, place, and time. He appears well-developed and well-nourished.  Frail appearing male  HENT: oral mucosa a little dry Head: Normocephalic and atraumatic.  Eyes: Conjunctivae are normal. Pupils are equal, round, and reactive to light.  Neck: Normal range of motion. Neck supple.  Cardiovascular: Normal rate and regular rhythm. no murmurs, rubs, gallops Respiratory: Effort normal. Fair air movement. He has no wheezes. No distress GI: Soft. Bowel sounds are normal. He exhibits no distension. There is no tenderness.  Musculoskeletal: He exhibits trace tibial edema.Marland Kitchen He exhibits no tenderness.  Neurological: He is alert and oriented to person, place, and time. Reasonable insight and awareness.  Dysphonic speech  improving. Able to  follow one and two step commands without difficulty.  UE: 4- deltoid, 4 bicep,right  tricep, wrist, hi LUE: tricep 3+, wrist 3+, HI 3, 4th/5th finger drop. LE: 3 hf, 3+ ke and 4 adf/apf  Skin: Skin is warm and dry.  Dry skin bilateral shins. RLE incisions healing well--clean,dry and intact. Small scabbed abrasion below left knee laterally.chest wall incision intact  Psychiatric: Thought content normal. Pleasant mood.   Assessment/Plan: 1. Functional deficits secondary to debility after CABG and multiple medical  which require 3+ hours per day of interdisciplinary therapy in a comprehensive inpatient rehab setting. Physiatrist is providing close team supervision and 24 hour management of active medical problems listed below. Physiatrist and rehab team continue to assess barriers to discharge/monitor patient progress toward functional and medical goals. FIM: FIM - Bathing Bathing Steps Patient Completed: Chest, Right Arm, Left Arm, Front perineal area, Abdomen, Right upper leg, Left upper leg Bathing: 3: Mod-Patient completes 5-7 42f 10 parts or 50-74%  FIM - Upper Body Dressing/Undressing Upper body dressing/undressing steps patient completed:  (pt did not have any street clothes; gown change ) Upper body dressing/undressing: 0: Wears gown/pajamas-no public clothing FIM - Lower Body Dressing/Undressing Lower body dressing/undressing: 0: Wears gown/pajamas-no public clothing  FIM - Toileting Toileting: 0: Activity did not occur  FIM - Air cabin crew Transfers: 2-To toilet/BSC: Max A (lift and lower assist), 2-From toilet/BSC: Max A (lift and lower assist)  FIM - Engineer, site Assistive Devices: Bed rails Bed/Chair Transfer: 4: Supine > Sit: Min A (  steadying Pt. > 75%/lift 1 leg), 2: Bed > Chair or W/C: Max A (lift and lower assist)  FIM - Locomotion: Wheelchair Distance: 50 ft Locomotion: Wheelchair: 2: Travels 50 - 149 ft with supervision, cueing  or coaxing (LE's only) FIM - Locomotion: Ambulation Locomotion: Ambulation Assistive Devices: Other (comment) (no AD, UE support on shoulders of PT) Ambulation/Gait Assistance: 3: Mod assist Locomotion: Ambulation: 1: Travels less than 50 ft with moderate assistance (Pt: 50 - 74%)  Comprehension Comprehension Mode: Auditory Comprehension: 4-Understands basic 75 - 89% of the time/requires cueing 10 - 24% of the time  Expression Expression Mode: Verbal Expression: 4-Expresses basic 75 - 89% of the time/requires cueing 10 - 24% of the time. Needs helper to occlude trach/needs to repeat words.  Social Interaction Social Interaction: 5-Interacts appropriately 90% of the time - Needs monitoring or encouragement for participation or interaction.  Problem Solving Problem Solving: 4-Solves basic 75 - 89% of the time/requires cueing 10 - 24% of the time  Memory Memory: 4-Recognizes or recalls 75 - 89% of the time/requires cueing 10 - 24% of the time  Medical Problem List and Plan: 1. Functional deficits secondary to debility after cute respiratory failure and CHF exacerbation s/p CABG.  -has persistent LUE weakness, ?lower cervical radic with ulnar neuropathy  -pt states that this was an issue before admission---i reviewed past imaging see no imaging of the neck in Epic  -continue to observe for now, could splint left hand if needed.  2. DVT Prophylaxis/Anticoagulation: Pharmaceutical: Lovenox due to VTE risk is required 3. Pain Management: oxycodone prn---observe activity tolerance with therapy 4. Mood: Increase Remeron to home dose to help manage insomnia and mood. Team to provide ego support. LCSW to follow for evaluation and support.  5. Neuropsych: This patient is capable of making decisions on his own behalf. 6. Skin/Wound Care: Routine pressure relief measures. Maintain adequate nutritional intake. Offer supplements between meals.  7. Fluids/Electrolytes/Nutrition: Monitor I/O.  Daily labs to monitor metabolic/electrolyte status 8. CAD s/p CABG: Continue sternal precautions. Continues to have incisional pain but no CP. On ASA, lipitor, coreg and Entresto.  9. Acute on systolic CHF: Monitor daily weights. Low salt diet.   aldactone, coreg, Entestor and digoxin.  -lasix already given today but hold going forward for now, recheck bmet tomorrow  - Cardiology to follow for input. weight down to 58.4kg today,   -all labs personally reviewed today.  10 COPD: Avoiding Spiriva with BPH/voiding difficulties. Continue Brovan/pulmicort with prn Xopenex. To be discharged on symbicort 80/4.5 2 puffs bid + prn xopenex  -outpt follow up with pulmonary at dc  -lungs clear at present 11. ABLA: Slowly improving. Labs reviewed. Steady today 12. HTN: Soft blood pressures but asymptomatic at present. Observe for activity tolerance.  13. Urinary retention/BPH: continue Avodart. Would like to remove foley over next few days potentially  -ua unremarkable , ucx negative so far 14. Adjustment disorder: Mentation improving gradually. Continue Xanax for chronic anxiety/ adjustment reaction.  15. Reactive leucocytosis: Likely due to steroids/surgery. No signs of infection on exam. Continue to monitor 16. Stress induced hyperglycemia: Hgb A1C- 5.9. Will continue SSI with aggressive BS monitoring to help promote healing past CABG.  LOS (Days) 2 A FACE TO FACE EVALUATION WAS PERFORMED  Qualyn Oyervides T 11/06/2014 8:25 AM

## 2014-11-06 NOTE — Progress Notes (Signed)
Occupational Therapy Session Note  Patient Details  Name: Travis Romero MRN: [id number] Date of Birth: 08-16-1950  Today's Date: 11/06/2014 OT Individual Time: 0700-0800 OT Individual Time Calculation (min): 60 min    Short Term Goals: Week 1:  OT Short Term Goal 1 (Week 1): Pt will complete UB dressing with min A to increase functional independence.  OT Short Term Goal 2 (Week 1): Pt will complete LB dressing with mod A sit<>stand to increase functional independence.  OT Short Term Goal 3 (Week 1): Pt will complete functional transfers with mod A.  OT Short Term Goal 4 (Week 1): Pt will complete functional activity in standing for 30s with mod A.   Skilled Therapeutic Interventions/Progress Updates:    Pt engaged in BADL retraining including bathing and dressing with sit<>stand from w/c at sink.  Pt declined shower this morning stating he was concerned with getting "chilled." Pt did not have any clothing this morning.  Pt initially reluctant to wash and dress but agreeable after explanation provided.  Pt required mod A for sit<>stand from EOB and transfer to w/c.  Pt stood at sink X 3 with min A.  Pt required min verbal cues for task initiation and assistance for thoroughness.  Pt required more than a reasonable amount of time to complete tasks.  Focus on activity tolerance, sit<>stand, standing balance, functional transfers, task initiation, sequencing, and safety awareness.  Therapy Documentation Precautions:  Precautions Precautions: Fall, Sternal Precaution Comments: watch HR  Restrictions Weight Bearing Restrictions: No Pain: Pain Assessment Pain Assessment: No/denies pain  See FIM for current functional status  Therapy/Group: Individual Therapy  [name] 11/06/2014, 8:01 AM

## 2014-11-06 NOTE — Progress Notes (Signed)
Physical Therapy Session Note  Patient Details  Name: Bryton Romagnoli MRN: [id number] Date of Birth: 06-30-1950  Today's Date: 11/06/2014 PT Individual Time: 1005-1105 PT Individual Time Calculation (min): 60 min   Short Term Goals: Week 1:  PT Short Term Goal 1 (Week 1): pt will perform standing activity for 5 min with min assist PT Short Term Goal 2 (Week 1): pt will ambulate with no AD 25 feet with min assist PT Short Term Goal 3 (Week 1): pt will perform bed mobillity with supervision PT Short Term Goal 4 (Week 1): pt will negotiate two six inch steps using fingertip support on rails with min assist PT Short Term Goal 5 (Week 1): pt will perfrom stand pivot transfer with min assist  Skilled Therapeutic Interventions/Progress Updates:  Session focused on functional mobility, balance, and activity tolerance. Pt required max assist with initial transfer from wheelchair to mat while demonstrating retropulsion, and uncoordinated foot placement/LE control. Pt performed sit<>stand X 6 while retrieving beach ball positioned in front in order to facilitate anterior weight shift, progressed from requiring mod assist to min assist for steadying. Pt needs max cuing to achieve erect posture, and maintain anterior weight shift. Static standing balance progressed from 10 sec to 1 min with close supervision. Pt ambulated 48 ft with no AD requiring min assist for balance, and PT in side-guard. Pt negotiated 8 three inch stairs using UE touchdown support on both rails requiring min assist and demonstrating reciprocal pattern ascending, step to pattern descending. Pt demonstrated improved carryover from previous session with improved activity tolerance, gait, and balance, and improved carryover within session progressed from requiring max assist with initial transfer to min assist at end of session.  Pt did appear to have decreased memory, not being able to recall previous OT therapy, or that he had already  ordered lunch. Pt left in wheelchair in room with quick release belt and all needs in reach.  Therapy Documentation Precautions:  Precautions Precautions: Fall, Sternal Precaution Comments: watch HR  Restrictions Weight Bearing Restrictions: No Pain: Pain Assessment Pain Assessment: Faces Pain Score: 3  Faces Pain Scale: Hurts even more Pain Type: Acute pain Pain Location: Other (Comment) (generalized, mib back) Pain Orientation: Mid Pain Descriptors / Indicators: Aching;Sore Pain Frequency: Constant Pain Onset: On-going Patients Stated Pain Goal: 4 Pain Intervention(s): Repositioned;Rest  See FIM for current functional status  Therapy/Group: Individual Therapy  Elsie Ra 11/06/2014, 12:27 PM

## 2014-11-06 NOTE — Progress Notes (Signed)
Speech Language Pathology Daily Session Note  Patient Details  Name: Travis Romero MRN: [id number] Date of Birth: 09-Mar-1951  Today's Date: 11/06/2014 SLP Individual Time: 0800-0900 SLP Individual Time Calculation (min): 60 min  Short Term Goals: Week 1: SLP Short Term Goal 1 (Week 1): Patient will utilize external memory aids to recall new, daily information with Min A multimodal cues. SLP Short Term Goal 2 (Week 1): Patient will demonstrate functional problem for basic and familiar tasks with Min A multimodal cues.  SLP Short Term Goal 3 (Week 1): Patient will demonstrate sustained attention to a functional task for 60 minutes with Min A verbal cues for redirection.  SLP Short Term Goal 4 (Week 1): Patient will self-monitor and correct errors with functional tasks with Min A verbal and question cues.  SLP Short Term Goal 5 (Week 1): Patient will utilize an increased vocal intensity at the phrase level with Min A verbal cues to increase intelligibility to 100%.   Skilled Therapeutic Interventions: Skilled treatment session focused on cognitive goals. SLP facilitated session by providing Min A question cues for recall of his current medications and their functions. SLP also facilitated session by providing extra time with Mod A multimodal cues for problem solving and organization while utilizing a 4 time per day pill box for medication management.  Patient demonstrated selective attention to task for 30 minutes with Min A verbal cues for redirection. Patient left upright in wheelchair with quick release belt in place and all needs within reach. Continue with current plan of care.    FIM:  Comprehension Comprehension Mode: Auditory Comprehension: 4-Understands basic 75 - 89% of the time/requires cueing 10 - 24% of the time Expression Expression Mode: Verbal Expression: 4-Expresses basic 75 - 89% of the time/requires cueing 10 - 24% of the time. Needs helper to occlude trach/needs to repeat  words. Social Interaction Social Interaction: 5-Interacts appropriately 90% of the time - Needs monitoring or encouragement for participation or interaction. Problem Solving Problem Solving: 4-Solves basic 75 - 89% of the time/requires cueing 10 - 24% of the time Memory Memory: 4-Recognizes or recalls 75 - 89% of the time/requires cueing 10 - 24% of the time  Pain Pain Assessment Pain Assessment: 0-10 Pain Score: 3  Pain Type: Acute pain Pain Location: Generalized Pain Descriptors / Indicators: Aching Pain Frequency: Constant Pain Onset: On-going Patients Stated Pain Goal: 4 Pain Intervention(s): Medication (See eMAR)  Therapy/Group: Individual Therapy  Courney Garrod 11/06/2014, 12:07 PM

## 2014-11-06 NOTE — Progress Notes (Signed)
Occupational Therapy Note  Patient Details  Name: Travis Romero MRN: [id number] Date of Birth: [birth year]  Today's Date: 11/06/2014 OT Individual Time: 1300-1330 OT Individual Time Calculation (min): 30 min   Pt denied pain Individual Therapy  Pt eating lunch upon arrival but agreeable to participating in therapy.  Pt initially engaged in functional amb with HHA to bathroom for toileting and return to sink to wash hands.  Discussed LTGs and discharge planning.  Pt stated that he believes his biggest deficit is physical stamina.  Pt also believes his recall of immediate events is limited.  Discussed LTGs and discharge planning.  Focus on activity tolerance, sit<>stand, functional amb without AD, standing balance, cognitive remediation, and safety awareness.   Leotis Shames [facility] 11/06/2014, 2:55 PM

## 2014-11-07 ENCOUNTER — Inpatient Hospital Stay (HOSPITAL_COMMUNITY): Payer: PRIVATE HEALTH INSURANCE | Admitting: Speech Pathology

## 2014-11-07 ENCOUNTER — Inpatient Hospital Stay (HOSPITAL_COMMUNITY): Payer: PRIVATE HEALTH INSURANCE | Admitting: Physical Therapy

## 2014-11-07 ENCOUNTER — Inpatient Hospital Stay (HOSPITAL_COMMUNITY): Payer: PRIVATE HEALTH INSURANCE

## 2014-11-07 LAB — GLUCOSE, CAPILLARY
GLUCOSE-CAPILLARY: 106 mg/dL — AB (ref 65–99)
GLUCOSE-CAPILLARY: 116 mg/dL — AB (ref 65–99)
Glucose-Capillary: 97 mg/dL (ref 65–99)
Glucose-Capillary: 113 mg/dL — ABNORMAL HIGH (ref 65–99)

## 2014-11-07 LAB — BASIC METABOLIC PANEL
ANION GAP: 7 (ref 5–15)
BUN: 32 mg/dL — ABNORMAL HIGH (ref 6–20)
CALCIUM: 8.8 mg/dL — AB (ref 8.9–10.3)
CHLORIDE: 93 mmol/L — AB (ref 101–111)
CO2: 33 mmol/L — ABNORMAL HIGH (ref 22–32)
CREATININE: 1.16 mg/dL (ref 0.61–1.24)
GFR calc non Af Amer: >60 mL/min (ref >60)
GLUCOSE: 111 mg/dL — AB (ref 65–99)
Potassium: 5 mmol/L (ref 3.5–5.1)
Sodium: 133 mmol/L — ABNORMAL LOW (ref 135–145)

## 2014-11-07 MED ORDER — ALTEPLASE 100 MG IV SOLR
2.0000 mg | Freq: Once | INTRAVENOUS | Status: AC
  Administered 2014-11-07: 2 mg
  Filled 2014-11-07: qty 2

## 2014-11-07 MED ORDER — ALTEPLASE 2 MG IJ SOLR: 2.0000 mg | Freq: Once | INTRAMUSCULAR | Status: DC

## 2014-11-07 MED ORDER — ALTEPLASE 2 MG IJ SOLR
2.0000 mg | Freq: Once | INTRAMUSCULAR | Status: AC
  Administered 2014-11-07: 2 mg
  Filled 2014-11-07: qty 2

## 2014-11-07 NOTE — Progress Notes (Signed)
Initial Nutrition Assessment  DOCUMENTATION CODES:   Severe malnutrition in context of chronic illness  INTERVENTION:  Continue 30 ml Prostat po BID, each dose provides 100 kcal and 15 grams of protein.   Provide nourishment snacks. (ordered).  Encourage adequate PO intake.   NUTRITION DIAGNOSIS:   Malnutrition related to chronic illness as evidenced by severe depletion of body fat, severe depletion of muscle mass.  GOAL:   Patient will meet greater than or equal to 90% of their needs  MONITOR:   PO intake, Supplement acceptance, Weight trends, Labs, I & O's  REASON FOR ASSESSMENT:   Consult  (snacks)  ASSESSMENT:   64 y.o. male with history of HTN, COPD, tobacco abuse who was admitted on 10/11/14 with progressive SOB due to acute on chronic respiratory failure and was intubation at admission. He was found to have acute systolic CHF requiring aggressive diuresis and 2 D echo done revealing EF 20-25% with mild MR. 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 Pt reports usual body weight of 150 lbs. Per Epic weight records, weight has been fluctuating, however pt with a 14.6% weight loss in 1 month. Pt reports eating fine at home PTA. Pt currently has Prostat ordered and has been consuming them. RD to continue with orders. RD additionally consulted for ordering snacks between meals. RD to order. Pt was encouraged to eat his food at meals and to consume his supplements.   Nutrition-Focused physical exam completed. Findings are severe fat depletion, severe muscle depletion, and mild edema.   Labs and medications reviewed.   Diet Order:  Diet Heart Room service appropriate?: Yes; Fluid consistency:: Thin  Skin:   (Incision on mid sternum and R thigh)  Last BM:  8/2  Height:   Ht Readings from Last 1 Encounters:  11/04/14 5\' 7"  (1.702 m)     Weight:   Wt Readings from Last 1 Encounters:  11/07/14 128 lb 4.9 oz (58.2 kg)    Ideal Body Weight:  67.3 kg  BMI:  Body mass index is 20.09 kg/(m^2).  Estimated Nutritional Needs:   Kcal:  1800-2000  Protein:  80-95 grams  Fluid:  1.8 - 1.9 L/day  EDUCATION NEEDS:   No education needs identified at this time  Corrin Parker, MS, RD, LDN Pager # [phone] After hours/ weekend pager # [phone]

## 2014-11-07 NOTE — Progress Notes (Signed)
Physical Therapy Session Note  Patient Details  Name: Travis Romero MRN: [id number] Date of Birth: 1951-01-06  Today's Date: 11/07/2014 PT Individual Time: 1005-1105 PT Individual Time Calculation (min): 60 min   Short Term Goals: Week 1:  PT Short Term Goal 1 (Week 1): pt will perform standing activity for 5 min with min assist PT Short Term Goal 2 (Week 1): pt will ambulate with no AD 25 feet with min assist PT Short Term Goal 3 (Week 1): pt will perform bed mobillity with supervision PT Short Term Goal 4 (Week 1): pt will negotiate two six inch steps using fingertip support on rails with min assist PT Short Term Goal 5 (Week 1): pt will perfrom stand pivot transfer with min assist  Skilled Therapeutic Interventions/Progress Updates:  Session focused on activity tolerance, functional mobility, and balance. Pt reported "feeling tired today after yesterdays session." Performed BLE stretching in sitting for hamstrings, heel cords, and hip IR/ER 2 X 30 seconds. Pt negotiated up/down 4 six inch steps with both rails UE touchdown support only, with min assist and cuing for hand placement, forward lean, and step to pattern preferred over reciprocal due to safety. Spo2=98% 2L O2 via n.c. and heart rate 99 upon rest from stairs. Pt perfromed 4 six inch stairs with both rails and UE touchdown support with min assist. SpO2 recorded at 93% room air and heart rate 103 after activity. Initiated D/C planning with patient, with PT recommending supervision/assistance upon D/C. Pt reports that he thinks he can return to work despite his cognitive deficits, however pt was aware that he will no longer be able to immediately after D/C per conversation about deficits. Pt reports that further D/C plans need to be arranged with sister or cousin. Pt ambulated 61 feet requiring min to mod assistance with balance, Sp02 monitored after activity with only slight drop off to 91% room air from 94% at rest. Pt returned to room  and left in chair with all needs in reach, quick release belt, placed back on O2 2L via n.c. and nursing staff present.  Therapy Documentation Precautions:  Precautions Precautions: Fall, Sternal Precaution Comments: watch HR  Restrictions Weight Bearing Restrictions: No Vital Signs: Therapy Vitals Pulse Rate: (!) 103 (after activity) Oxygen Therapy SpO2: 100 % O2 Device: Nasal Cannula O2 Flow Rate (L/min): 2 L/min Pain: Pain Assessment Pain Assessment: Faces Pain Score: 6  Faces Pain Scale: Hurts even more Pain Type: Acute pain Pain Location: Back Pain Orientation: Mid Pain Descriptors / Indicators: Aching Pain Frequency: Constant Pain Onset: On-going Patients Stated Pain Goal: 3 Pain Intervention(s): Repositioned;Rest  See FIM for current functional status  Therapy/Group: Individual Therapy  Elsie Ra 11/07/2014, 11:17 AM

## 2014-11-07 NOTE — Progress Notes (Signed)
Occupational Therapy Session Note  Patient Details  Name: Travis Romero MRN: [id number] Date of Birth: Apr 12, 1950  Today's Date: 11/07/2014 OT Individual Time: 1130-1200 OT Individual Time Calculation (min): 30 min    Short Term Goals: Week 1:  OT Short Term Goal 1 (Week 1): Pt will complete UB dressing with min A to increase functional independence.  OT Short Term Goal 2 (Week 1): Pt will complete LB dressing with mod A sit<>stand to increase functional independence.  OT Short Term Goal 3 (Week 1): Pt will complete functional transfers with mod A.  OT Short Term Goal 4 (Week 1): Pt will complete functional activity in standing for 30s with mod A.   Skilled Therapeutic Interventions/Progress Updates: Therapeutic exercise with focus on left hand/wrist strengthening.   Grip strength assessed using Jamar hand dyno: R= 45 lbs, L=12 lbs.   Pt noted with weak grip as well as weak wrist and finger extension.   Pt provided tan putty (very soft) and pink foam with instruction to facilitate finger extension with forearm supinated and/or use of right hand to assist through task.   Pt received seated in w/c, very lethargic and requiring frequent cues and prompts to maintain alertness through session.   HEP placed in patient handbook with materials (putty and foam block).        Therapy Documentation Precautions:  Precautions Precautions: Fall, Sternal Precaution Comments: watch HR  Restrictions Weight Bearing Restrictions: No   Vital Signs: Therapy Vitals Pulse Rate: (!) 103 (after activity) Oxygen Therapy SpO2: 92 % (after activity and without supplemental O2) O2 Device: Nasal Cannula O2 Flow Rate (L/min): 2 L/min   Pain: Pain Assessment Pain Assessment: Faces Faces Pain Scale: Hurts even more Pain Type: Acute pain Pain Location: Back Pain Orientation: Mid Pain Descriptors / Indicators: Aching Pain Onset: On-going Pain Intervention(s): Repositioned;Rest  See FIM for current  functional status  Therapy/Group: Individual Therapy  [place] 11/07/2014, 12:24 PM

## 2014-11-07 NOTE — Progress Notes (Signed)
   Renal function stable. K 5.0. Not on potassium supplementation.  Will continue to follow. May need to cut spiro back. BMET in am.   Bensimhon, Daniel,MD 7:53 AM

## 2014-11-07 NOTE — Progress Notes (Signed)
Speech Language Pathology Daily Session Note  Patient Details  Name: Travis Romero MRN: [id number] Date of Birth: 23-Apr-1950  Today's Date: 11/07/2014 SLP Individual Time: 0900-1000 SLP Individual Time Calculation (min): 60 min  Short Term Goals: Week 1: SLP Short Term Goal 1 (Week 1): Patient will utilize external memory aids to recall new, daily information with Min A multimodal cues. SLP Short Term Goal 2 (Week 1): Patient will demonstrate functional problem for basic and familiar tasks with Min A multimodal cues.  SLP Short Term Goal 3 (Week 1): Patient will demonstrate sustained attention to a functional task for 60 minutes with Min A verbal cues for redirection.  SLP Short Term Goal 4 (Week 1): Patient will self-monitor and correct errors with functional tasks with Min A verbal and question cues.  SLP Short Term Goal 5 (Week 1): Patient will utilize an increased vocal intensity at the phrase level with Min A verbal cues to increase intelligibility to 100%.   Skilled Therapeutic Interventions: Skilled treatment session focused on cognitive goals. SLP facilitated session by providing Min A verbal and visual cues for functional problem solving and organization during a basic money management task and for utilizing an external memory aid efficiently and effectively to increase recall of administration of PRN medications. Patient also required Min A verbal cues for sustained attention to tasks for 10 minutes, suspect due to fatigue. Patient left upright in wheelchair with all needs within reach. Continue with current plan of care.    FIM:  Comprehension Comprehension Mode: Auditory Comprehension: 4-Understands basic 75 - 89% of the time/requires cueing 10 - 24% of the time Expression Expression Mode: Verbal Expression: 5-Expresses basic 90% of the time/requires cueing < 10% of the time. Social Interaction Social Interaction: 5-Interacts appropriately 90% of the time - Needs monitoring  or encouragement for participation or interaction. Problem Solving Problem Solving: 4-Solves basic 75 - 89% of the time/requires cueing 10 - 24% of the time Memory Memory: 3-Recognizes or recalls 50 - 74% of the time/requires cueing 25 - 49% of the time  Pain Pain Assessment Pain Assessment: 0-10 Pain Score: 6  Faces Pain Scale: Hurts even more Pain Type: Acute pain Pain Location: Generalized Pain Orientation: Mid Pain Descriptors / Indicators: Aching Pain Frequency: Constant Pain Onset: On-going Patients Stated Pain Goal: 3 Pain Intervention(s): Medication (See eMAR)  Therapy/Group: Individual Therapy  Lovena Kluck 11/07/2014, 1:17 PM

## 2014-11-07 NOTE — Progress Notes (Signed)
[facility] PHYSICAL MEDICINE & REHABILITATION     PROGRESS NOTE    Subjective/Complaints: No specific complaints or changes with breathing. Denies pain. Appetite good.  ROS: Pt denies fever, rash/itching, headache, blurred or double vision, nausea, vomiting, abdominal pain, diarrhea, chest pain,  palpitations, dysuria, dizziness, neck or back pain, bleeding, anxiety, or depression   Objective: Vital Signs: Blood pressure 142/81, pulse 100, temperature 98.1 F (36.7 C), temperature source Oral, resp. rate 18, height 5\' 7"  (1.702 m), weight 58.2 kg (128 lb 4.9 oz), SpO2 92 %. No results found.  Recent Labs  11/05/14 0558 11/06/14 0535  WBC 11.5* 12.1*  HGB 10.7* 10.5*  HCT 33.6* 33.2*  PLT 451* 430*    Recent Labs  11/06/14 0535 11/07/14 0520  NA 136 133*  K 4.7 5.0  CL 95* 93*  GLUCOSE 104* 111*  BUN 27* 32*  CREATININE 1.26* 1.16  CALCIUM 9.1 8.8*   CBG (last 3)   Recent Labs  11/06/14 1706 11/06/14 2136 11/07/14 0703  GLUCAP 113* 125* 106*    Wt Readings from Last 3 Encounters:  11/07/14 58.2 kg (128 lb 4.9 oz)  11/04/14 56.2 kg (123 lb 14.4 oz)  10/07/14 68.493 kg (151 lb)    Physical Exam:  Constitutional: He is oriented to person, place, and time. He appears well-developed and well-nourished.  Frail appearing male  HENT: oral mucosa a little dry Head: Normocephalic and atraumatic.  Eyes: Conjunctivae are normal. Pupils are equal, round, and reactive to light.  Neck: Normal range of motion. Neck supple.  Cardiovascular: Normal rate and regular rhythm. no murmurs, rubs, gallops Respiratory: Effort normal. Fair air movement. ?decrease in RUL. He has no wheezes. No distress GI: Soft. Bowel sounds are normal. He exhibits no distension. There is no tenderness.  Musculoskeletal: He exhibits trace tibial edema.Marland Kitchen He exhibits no tenderness.  Neurological: He is alert and oriented to person, place, and time. Reasonable insight and awareness.   Dysphonic speech  improving. Able to follow one and two step commands without difficulty.  UE: 4- deltoid, 4 bicep,right  tricep, wrist, hi LUE: tricep 3+, wrist 3+, HI 3, 4th/5th finger drop. LE: 3 hf, 3+ ke and 4 adf/apf  Skin: Skin is warm and dry.  Dry skin bilateral shins. RLE incisions healing well--clean,dry and intact. Small scabbed abrasion below left knee laterally.chest wall incision intact  Psychiatric: Thought content generally normal. Pleasant mood.   Assessment/Plan: 1. Functional deficits secondary to debility after CABG and multiple medical  which require 3+ hours per day of interdisciplinary therapy in a comprehensive inpatient rehab setting. Physiatrist is providing close team supervision and 24 hour management of active medical problems listed below. Physiatrist and rehab team continue to assess barriers to discharge/monitor patient progress toward functional and medical goals. FIM: FIM - Bathing Bathing Steps Patient Completed: Chest, Right Arm, Left Arm, Front perineal area, Abdomen, Right upper leg, Left upper leg Bathing: 3: Mod-Patient completes 5-7 38f 10 parts or 50-74%  FIM - Upper Body Dressing/Undressing Upper body dressing/undressing steps patient completed: Thread/unthread right sleeve of front closure shirt/dress, Thread/unthread left sleeve of front closure shirt/dress Upper body dressing/undressing: 3: Mod-Patient completed 50-74% of tasks FIM - Lower Body Dressing/Undressing Lower body dressing/undressing: 1: Total-Patient completed less than 25% of tasks  FIM - Toileting Toileting: 0: Activity did not occur  FIM - Air cabin crew Transfers: 2-To toilet/BSC: Max A (lift and lower assist), 2-From toilet/BSC: Max A (lift and lower assist)  FIM - Engineer, site  Assistive Devices:  (no AD) Bed/Chair Transfer: 4: Supine > Sit: Min A (steadying Pt. > 75%/lift 1 leg), 2: Bed > Chair or W/C: Max A (lift and lower  assist)  FIM - Locomotion: Wheelchair Distance: 50 ft Locomotion: Wheelchair: 0: Activity did not occur FIM - Locomotion: Ambulation Locomotion: Ambulation Assistive Devices: Other (comment) (no AD, guarding from side) Ambulation/Gait Assistance: 4: Min assist Locomotion: Ambulation: 1: Travels less than 50 ft with minimal assistance (Pt.>75%) (48 feet)  Comprehension Comprehension Mode: Auditory Comprehension: 4-Understands basic 75 - 89% of the time/requires cueing 10 - 24% of the time  Expression Expression Mode: Verbal Expression: 4-Expresses basic 75 - 89% of the time/requires cueing 10 - 24% of the time. Needs helper to occlude trach/needs to repeat words.  Social Interaction Social Interaction: 5-Interacts appropriately 90% of the time - Needs monitoring or encouragement for participation or interaction.  Problem Solving Problem Solving: 4-Solves basic 75 - 89% of the time/requires cueing 10 - 24% of the time  Memory Memory: 4-Recognizes or recalls 75 - 89% of the time/requires cueing 10 - 24% of the time  Medical Problem List and Plan: 1. Functional deficits secondary to debility after cute respiratory failure and CHF exacerbation s/p CABG.  -has persistent LUE weakness, ?lower cervical radic with ?neuropraxic ulnar nerve injury  -pt states that this was an issue before admission---i reviewed past imaging see no evidence of imaging or work up in epic  -continue to observe for now, could splint left hand if needed.  2. DVT Prophylaxis/Anticoagulation: Pharmaceutical: Lovenox due to VTE risk is required 3. Pain Management: oxycodone prn---observe activity tolerance with therapy 4. Mood: Increase Remeron to home dose to help manage insomnia and mood. Team to provide ego support. LCSW to follow for evaluation and support.  5. Neuropsych: This patient is capable of making decisions on his own behalf. 6. Skin/Wound Care: Routine pressure relief measures. Maintain adequate  nutritional intake. Offer supplements between meals.  7. Fluids/Electrolytes/Nutrition: Monitor I/O. Daily labs to monitor metabolic/electrolyte status 8. CAD s/p CABG: Continue sternal precautions. Continues to have incisional pain but no CP. On ASA, lipitor, coreg and Entresto.  9. Acute on systolic CHF: Monitor daily weights. Low salt diet.   aldactone, coreg, Entestor and digoxin.  -lasix to resume tomorrow as bun/cr trending up  - Cardiology following also.weight down at 58kg   -all labs personally reviewed today.  10 COPD: Avoiding Spiriva with BPH/voiding difficulties. Continue Brovan/pulmicort with prn Xopenex. To be discharged on symbicort 80/4.5 2 puffs bid + prn xopenex  -outpt follow up with pulmonary at dc  -lungs remain generally clear but with reduced air flow. Continue to work on activity tolerance 11. ABLA: Slowly improving. Labs reviewed. Steady today 12. HTN: Soft blood pressures but asymptomatic at present. Observe for activity tolerance.  13. Urinary retention/BPH: continue Avodart. Would like to remove foley over next few days potentially  -ua unremarkable , ucx negative  14. Adjustment disorder: Mentation improving gradually. Continue Xanax for chronic anxiety/ adjustment reaction.  15. Reactive leucocytosis: Likely due to steroids/surgery. No signs of infection on exam. Continue to monitor 16. Stress induced hyperglycemia: Hgb A1C- 5.9. Will continue SSI with aggressive BS monitoring to help promote healing past CABG.    LOS (Days) 3 A FACE TO FACE EVALUATION WAS PERFORMED  Anush Wiedeman T 11/07/2014 9:52 AM

## 2014-11-07 NOTE — Plan of Care (Signed)
Problem: RH PAIN MANAGEMENT Goal: RH STG PAIN MANAGED AT OR BELOW PT'S PAIN GOAL <5  Outcome: Not Progressing Pain is always >5

## 2014-11-07 NOTE — Care Management Note (Signed)
[facility] Individual Statement of Services  Patient Name:  Travis Romero  Date:  11/07/2014  Welcome to the [facility].  Our goal is to provide you with an individualized program based on your diagnosis and situation, designed to meet your specific needs.  With this comprehensive rehabilitation program, you will be expected to participate in at least 3 hours of rehabilitation therapies Monday-Friday, with modified therapy programming on the weekends.  Your rehabilitation program will include the following services:  Physical Therapy (PT), Occupational Therapy (OT), Speech Therapy (ST), 24 hour per day rehabilitation nursing, Therapeutic Recreaction (TR), Neuropsychology, Case Management (Social Worker), Rehabilitation Medicine, Nutrition Services and Pharmacy Services  Weekly team conferences will be held on Tuesdays to discuss your progress.  Your Social Worker will talk with you frequently to get your input and to update you on team discussions.  Team conferences with you and your family in attendance may also be held.  Expected length of stay: 14-20  days  Overall anticipated outcome: minimal assistance  Depending on your progress and recovery, your program may change. Your Social Worker will coordinate services and will keep you informed of any changes. Your Social Worker's name and contact numbers are listed  below.  The following services may also be recommended but are not provided by the [facility] will be made to provide these services after discharge if needed.  Arrangements include referral to agencies that provide these services.  Your insurance has been verified to be:  [place] Your primary doctor is:  Mynor Witkop  Pertinent information will be shared with your doctor  and your insurance company.  Social Worker:  [place], [place] or ([phone]   Information discussed with and copy given to patient by: Lennart Pall, 11/07/2014, 3:36 PM

## 2014-11-07 NOTE — Progress Notes (Signed)
Social Work Assessment and Plan Patient Details  Name: Travis Romero MRN: [id number] Date of Birth: 1951-01-09  Today's Date: 11/07/2014  Problem List:  Patient Active Problem List   Diagnosis Date Noted  . Physical debility 11/04/2014  . 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 Urinary retention   . Pleural effusion   . BPH (benign prostatic hypertrophy) with urinary retention   . Coronary artery disease involving native coronary artery of native heart without angina pectoris   . SOB (shortness of breath)   . Tobacco abuse   . HLD (hyperlipidemia)   . Severe peripheral arterial disease 10/18/2014  . NSTEMI (non-ST elevated myocardial infarction)   . Acute on chronic combined systolic and diastolic CHF (congestive heart failure)   . Shortness of breath   . Adjustment disorder with anxious mood   . Acute respiratory failure with hypoxia 10/13/2014  . Acute combined systolic and diastolic CHF, NYHA class 4 10/13/2014  . Acute pulmonary edema   . Endotracheal tube present   . CHF exacerbation 10/12/2014  . Benign essential HTN 10/07/2014  . BPH (benign prostatic hyperplasia) 10/07/2014  . COPD (chronic obstructive pulmonary disease) 10/07/2014  . Smoker 10/07/2014   Past Medical History:  Past Medical History  Diagnosis Date  . Hypertension   . COPD (chronic obstructive pulmonary disease)   . BPH (benign prostatic hyperplasia)   . Back pain 2007    after fall down steps   Past Surgical History:  Past Surgical History  Procedure Laterality Date  . Tonsillectomy    . Pyloromyotomy      pyloric stenosis  . Cardiac catheterization N/A 10/17/2014    Procedure: Right/Left Heart Cath and Coronary Angiography;  Surgeon: Troy Sine, MD;  Location: [facility];  Service: Cardiovascular;  Laterality: N/A;  . Coronary artery bypass graft N/A 10/23/2014    Procedure:   CORONARY ARTERY BYPASS GRAFTING (CABG)X3 LIMA-LAD;SVG to OM,SVG to Distal RCA;  Surgeon: Melrose Nakayama, MD;   Location: [facility];  Service: Open Heart Surgery;  Laterality: N/A;  . Tee without cardioversion N/A 10/23/2014    Procedure: TRANSESOPHAGEAL ECHOCARDIOGRAM (TEE);  Surgeon: Melrose Nakayama, MD;  Location: [facility];  Service: Open Heart Surgery;  Laterality: N/A;   Social History:  reports that he has been smoking Cigarettes.  He has smoked for the past 20 years. He has never used smokeless tobacco. He reports that he drinks about 1.2 - 1.8 oz of alcohol per week. He reports that he does not use illicit drugs.  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 Ability/Limitations of Caregiver: No caregiver available for pt Caregiver Availability:  (None available for care after d/c) Family Dynamics: Pt with very limited support as no family in state except his cousin.  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  Poor awareness of his deficits and likely longer term assist needs. Legal Hisotry/Current Legal Issues: Cousin currently working on getting POA for pt to assist with financial matters. Guardian/Conservator: Per MD, pt capable of making decisions on his own behalf   Abuse/Neglect Physical  Abuse: Denies Verbal Abuse: Denies Sexual Abuse: Denies Exploitation of patient/patient's resources: Denies Self-Neglect: Denies  Emotional Status Pt's affect, behavior adn adjustment status: Pt with hoarse speech and difficult to hear at times.  While he attempts to answer assessment questions,  he cannot recall some basic information (i.e. length of time he has been in [place],  how many times divorced.")  Denies any significant emotional distress beyond his chronic "anxiety issues" that he takes meds for.   He then adds "I guess I'm depressed about all of this that is happening now."  Have referred for neuropsychologh to complete formal depression and coping screen. Recent Psychosocial Issues: Pt denies Pyschiatric History: Pt reports that he has chronic anxiety and takes meds. Substance Abuse History: tobacco  Patient / Family Perceptions, Expectations & Goals Pt/Family understanding of illness & functional limitations: Pt offers vague report on medical diagnosis - "open heart surgery".  Poor appreciation of his current level of functional limitations and likely longer term assistance needs.  Sister also with poor understanding of his current level or need for assist after d/c. She also talks about his return to work. Premorbid pt/family roles/activities: Pt was working full-time and independent Anticipated changes in roles/activities/participation: Per goals, anticipate that pt will require 24/7 assistance at d/c, however, no one is able to provide this. Pt/family expectations/goals: Pt's goals are to be able to return to his own apartment and work.  Community Resources Express Scripts: None Premorbid Home Care/DME Agencies: None Transportation available at discharge: no Resource referrals recommended: Neuropsychology  Discharge Planning Living Arrangements: Alone Support Systems: Other relatives Type of Residence: Private residence Insurance Resources: Multimedia programmer (specify)  Environmental education officer) Financial Resources: Employment Museum/gallery curator Screen Referred: No Living Expenses: Education officer, community Management: Patient Does the patient have any problems obtaining your medications?: No Home Management: pt Patient/Family Preliminary Plans: Per pt and sister, they both hope that he will be able to to return to his own apartment.  To meet with both of them tomorrow to further discuss REALISTIC plans given his physical and cognitive impairment. Barriers to Discharge: Family Support, Self care, Steps Social Work Anticipated Follow Up Needs: SNF, HH/OP Expected length of stay: 13-18 days  Clinical Impression Unfortunate, very debilitated gentleman here following resp failure and undergoing CABG x 3.  Physical and cognitive impairments are evident and team anticipates will need 24/7 assist upon d/c.  Pt and sister with poor appreciation of current and expected longer term assist needs.  Will meet with them together tomorrow and discuss realistic planning needs.  Follow for d/c planning and support.     Denim Kalmbach 11/07/2014, 3:34 PM

## 2014-11-07 NOTE — Progress Notes (Signed)
Occupational Therapy Session Note  Patient Details  Name: Travis Romero MRN: [id number] Date of Birth: January 20, 1951  Today's Date: 11/07/2014 OT Individual Time: 0700-0800 OT Individual Time Calculation (min): 60 min    Short Term Goals: Week 1:  OT Short Term Goal 1 (Week 1): Pt will complete UB dressing with min A to increase functional independence.  OT Short Term Goal 2 (Week 1): Pt will complete LB dressing with mod A sit<>stand to increase functional independence.  OT Short Term Goal 3 (Week 1): Pt will complete functional transfers with mod A.  OT Short Term Goal 4 (Week 1): Pt will complete functional activity in standing for 30s with mod A.   Skilled Therapeutic Interventions/Progress Updates:    Pt resting in bed upon arrival.  Pt lethargic this morning but agreeable to therapy.  Pt continues to decline shower stating that he gets "chilled" easily and doesn't want to "take a chance." Pt engaged in BADL retraining including bathing and dressing with sit<>stand from w/c at sink.  Pt continues to require max A for sit<>stand and steady A while standing.  Pt requires BUE support when standing and requires assistance with bathing buttocks and pulling up pants.  Pt requires more than a reasonable amount of time to initiate and complete tasks.  Pt requires multiple rest breaks throughout session.  Focus on bed mobility, activity tolerance, sit<>stand, standing balance, task initiation, and safety awareness.  Therapy Documentation Precautions:  Precautions Precautions: Fall, Sternal Precaution Comments: watch HR  Restrictions Weight Bearing Restrictions: No   Pain: Pain Assessment Pain Score: 0-No pain  See FIM for current functional status  Therapy/Group: Individual Therapy  [name] 11/07/2014, 8:01 AM

## 2014-11-07 NOTE — Progress Notes (Signed)
      [address]       [place] [address]             [phone]      Social visit  Mr. Travis Romero looks much better today.  He is making good progress with rehab.  Still has some incisional pain but it is improving.  Travis Romero Roxan Hockey, MD [facility] [phone]

## 2014-11-08 ENCOUNTER — Inpatient Hospital Stay (HOSPITAL_COMMUNITY): Payer: PRIVATE HEALTH INSURANCE | Admitting: Speech Pathology

## 2014-11-08 ENCOUNTER — Inpatient Hospital Stay (HOSPITAL_COMMUNITY): Payer: PRIVATE HEALTH INSURANCE

## 2014-11-08 ENCOUNTER — Inpatient Hospital Stay (HOSPITAL_COMMUNITY): Payer: PRIVATE HEALTH INSURANCE | Admitting: Physical Therapy

## 2014-11-08 LAB — BASIC METABOLIC PANEL
Anion gap: 8 (ref 5–15)
BUN: 35 mg/dL — ABNORMAL HIGH (ref 6–20)
CO2: 31 mmol/L (ref 22–32)
CREATININE: 1.27 mg/dL — AB (ref 0.61–1.24)
Calcium: 8.8 mg/dL — ABNORMAL LOW (ref 8.9–10.3)
Chloride: 95 mmol/L — ABNORMAL LOW (ref 101–111)
GFR calc Af Amer: >60 mL/min (ref >60)
GFR, EST NON AFRICAN AMERICAN: 58 mL/min — AB (ref >60)
GLUCOSE: 113 mg/dL — AB (ref 65–99)
POTASSIUM: 4.9 mmol/L (ref 3.5–5.1)
Sodium: 134 mmol/L — ABNORMAL LOW (ref 135–145)

## 2014-11-08 LAB — CARBOXYHEMOGLOBIN
CARBOXYHEMOGLOBIN: 1.7 % — AB (ref 0.5–1.5)
Methemoglobin: 0.9 % (ref 0.0–1.5)
O2 SAT: 64.7 %
Total hemoglobin: 13.5 g/dL (ref 13.5–18.0)

## 2014-11-08 LAB — GLUCOSE, CAPILLARY
GLUCOSE-CAPILLARY: 128 mg/dL — AB (ref 65–99)
Glucose-Capillary: 143 mg/dL — ABNORMAL HIGH (ref 65–99)
Glucose-Capillary: 118 mg/dL — ABNORMAL HIGH (ref 65–99)
Glucose-Capillary: 121 mg/dL — ABNORMAL HIGH (ref 65–99)

## 2014-11-08 MED ORDER — TRAZODONE HCL 50 MG PO TABS
25.0000 mg | ORAL_TABLET | Freq: Every evening | ORAL | Status: DC | PRN
  Administered 2014-11-08 – 2014-11-11 (×2): 25 mg via ORAL
  Filled 2014-11-08 (×3): qty 1

## 2014-11-08 NOTE — Progress Notes (Signed)
Speech Language Pathology Daily Session Note  Patient Details  Name: Travis Romero MRN: [id number] Date of Birth: 03-Jul-1950  Today's Date: 11/08/2014 SLP Individual Time: 0900-1000 SLP Individual Time Calculation (min): 60 min  Short Term Goals: Week 1: SLP Short Term Goal 1 (Week 1): Patient will utilize external memory aids to recall new, daily information with Min A multimodal cues. SLP Short Term Goal 2 (Week 1): Patient will demonstrate functional problem for basic and familiar tasks with Min A multimodal cues.  SLP Short Term Goal 3 (Week 1): Patient will demonstrate sustained attention to a functional task for 60 minutes with Min A verbal cues for redirection.  SLP Short Term Goal 4 (Week 1): Patient will self-monitor and correct errors with functional tasks with Min A verbal and question cues.  SLP Short Term Goal 5 (Week 1): Patient will utilize an increased vocal intensity at the phrase level with Min A verbal cues to increase intelligibility to 100%.   Skilled Therapeutic Interventions: Skilled treatment session focused on cognitive goals. SLP facilitated session by providing Mod A multimodal cues for use of initiation and utilization of external memory aids to increase recall of new and functional information. Patient also required Max A verbal cues for organization of information and for recall of events from previous therapy sessions. SLP also facilitated a functional conversation with focus on intellectual awareness of deficits and anticipatory awareness in regards to assistance the patient will require at home and patient initially not being able to return to work. Patient required overall Max A for awareness throughout the conversation.  Patient left upright in wheelchair with all needs within reach and quick release belt in place. Continue with current plan of care.    FIM:  Comprehension Comprehension Mode: Auditory Comprehension: 4-Understands basic 75 - 89% of the  time/requires cueing 10 - 24% of the time Expression Expression Mode: Verbal Expression: 5-Expresses basic 90% of the time/requires cueing < 10% of the time. Social Interaction Social Interaction: 5-Interacts appropriately 90% of the time - Needs monitoring or encouragement for participation or interaction. Problem Solving Problem Solving: 4-Solves basic 75 - 89% of the time/requires cueing 10 - 24% of the time Memory Memory: 3-Recognizes or recalls 50 - 74% of the time/requires cueing 25 - 49% of the time  Pain Pain Assessment Pain Assessment: No/denies pain   Therapy/Group: Individual Therapy  Isaiahs Chancy 11/08/2014, 11:51 AM

## 2014-11-08 NOTE — Progress Notes (Signed)
Occupational Therapy Session Note  Patient Details  Name: Deavon Podgorski MRN: [id number] Date of Birth: 01/08/1951  Today's Date: 11/08/2014 OT Individual Time: 1300-1340 OT Individual Time Calculation (min): 40 min    Short Term Goals: Week 1:  OT Short Term Goal 1 (Week 1): Pt will complete UB dressing with min A to increase functional independence.  OT Short Term Goal 2 (Week 1): Pt will complete LB dressing with mod A sit<>stand to increase functional independence.  OT Short Term Goal 3 (Week 1): Pt will complete functional transfers with mod A.  OT Short Term Goal 4 (Week 1): Pt will complete functional activity in standing for 30s with mod A.   Skilled Therapeutic Interventions/Progress Updates: Therapeutic exercise with focus on left hand wrist extension and finger flexion/extension.   After setup to assist pt out of bed to w/c, pt completes UE exercises with instructional cues and manual facilitation to perform exercises correctly.  Pt performs 20 reps of seated bilateral wrist curls with accentuation of finger extension at digits 4 and 5 of left hand through graded release of weight from pt to therapist.   Pt then performs theraputty exercises as directed with max instructional cues and is provided additional foam blocks for continued exercising through the weekend.   Pt is unable to problem-solve use of putty at this point, despite written HEP with graphics.   Pt remains in w/c at end of session to complete self-feeing his lunch.  Call light and phone placed within reach.      Therapy Documentation Precautions:  Precautions Precautions: Fall, Sternal Precaution Comments: watch HR  Restrictions Weight Bearing Restrictions: No (Sternal precautions)  Vital Signs: Therapy Vitals Temp: 98.9 F (37.2 C) Temp Source: Oral Pulse Rate: (!) 110 (after activity ) Resp: 18 BP: 110/70 mmHg Patient Position (if appropriate): Sitting Oxygen Therapy SpO2: (!) 83 % (after activity) O2  Device: Not Delivered O2 Flow Rate (L/min): 2 L/min   Pain: Pain Assessment Pain Assessment: No/denies pain  See FIM for current functional status  Therapy/Group: Individual Therapy  [place] 11/08/2014, 2:53 PM

## 2014-11-08 NOTE — Progress Notes (Signed)
PHYSICAL MEDICINE & REHABILITATION     PROGRESS NOTE    Subjective/Complaints: Feeling fairly well. No changes in breathing. Slept well. Denies pain. Left arm/hand still weak.  ROS: Pt denies fever, rash/itching, headache, blurred or double vision, nausea, vomiting, abdominal pain, diarrhea, chest pain,  palpitations, dysuria, dizziness, neck or back pain, bleeding, anxiety, or depression   Objective: Vital Signs: Blood pressure 112/66, pulse 106, temperature 98.1 F (36.7 C), temperature source Oral, resp. rate 18, height 5\' 7"  (1.702 m), weight 60.4 kg (133 lb 2.5 oz), SpO2 100 %. No results found.  Recent Labs  11/06/14 0535  WBC 12.1*  HGB 10.5*  HCT 33.2*  PLT 430*    Recent Labs  11/07/14 0520 11/08/14 0455  NA 133* 134*  K 5.0 4.9  CL 93* 95*  GLUCOSE 111* 113*  BUN 32* 35*  CREATININE 1.16 1.27*  CALCIUM 8.8* 8.8*   CBG (last 3)   Recent Labs  11/07/14 1602 11/07/14 2125 11/08/14 0651  GLUCAP 97 113* 128*    Wt Readings from Last 3 Encounters:  11/08/14 60.4 kg (133 lb 2.5 oz)  11/04/14 56.2 kg (123 lb 14.4 oz)  10/07/14 68.493 kg (151 lb)    Physical Exam:  Constitutional: He is oriented to person, place, and time. He appears well-developed and well-nourished.  Frail appearing male  HENT: oral mucosa a little dry Head: Normocephalic and atraumatic.  Eyes: Conjunctivae are normal. Pupils are equal, round, and reactive to light.  Neck: Normal range of motion. Neck supple.  Cardiovascular: Normal rate and regular rhythm. no murmurs, rubs, gallops Respiratory: Effort normal. Fair air movement. ?decrease in RUL. He has no wheezes. No distress GI: Soft. Bowel sounds are normal. He exhibits no distension. There is no tenderness.  Musculoskeletal: He exhibits trace tibial edema.Marland Kitchen He exhibits no tenderness.  Neurological: He is alert and oriented to person, place, and time. Reasonable insight and awareness.  Dysphonic speech   improving. Able to follow one and two step commands without difficulty.  UE: 4- deltoid, 4 bicep,right  tricep, wrist, hi LUE: tricep 3+ to 4-, wrist 3+, HI 3, 4th/5th finger drop. LE: 3 hf, 3+ ke and 4 adf/apf  Skin: Skin is warm and dry.  Dry skin bilateral shins. RLE incisions healing well--clean,dry and intact. Small scabbed abrasion below left knee laterally.chest wall incision intact  Psychiatric: Thought content generally normal. Pleasant mood.   Assessment/Plan: 1. Functional deficits secondary to debility after CABG and multiple medical  which require 3+ hours per day of interdisciplinary therapy in a comprehensive inpatient rehab setting. Physiatrist is providing close team supervision and 24 hour management of active medical problems listed below. Physiatrist and rehab team continue to assess barriers to discharge/monitor patient progress toward functional and medical goals. FIM: FIM - Bathing Bathing Steps Patient Completed: Chest, Right Arm, Left Arm, Front perineal area, Abdomen, Right upper leg, Left upper leg Bathing: 0: Activity did not occur  FIM - Upper Body Dressing/Undressing Upper body dressing/undressing steps patient completed: Thread/unthread right sleeve of front closure shirt/dress, Thread/unthread left sleeve of front closure shirt/dress Upper body dressing/undressing: 0: Activity did not occur FIM - Lower Body Dressing/Undressing Lower body dressing/undressing: 0: Activity did not occur  FIM - Toileting Toileting: 0: Activity did not occur  FIM - Air cabin crew Transfers: 2-To toilet/BSC: Max A (lift and lower assist), 2-From toilet/BSC: Max A (lift and lower assist)  FIM - Control and instrumentation engineer Devices:  (no AD) Bed/Chair Transfer: 3:  Supine > Sit: Mod A (lifting assist/Pt. 50-74%/lift 2 legs, 2: Bed > Chair or W/C: Max A (lift and lower assist)  FIM - Locomotion: Wheelchair Distance: 50 ft Locomotion: Wheelchair:  0: Activity did not occur FIM - Locomotion: Ambulation Locomotion: Ambulation Assistive Devices: Other (comment) (no AD) Ambulation/Gait Assistance: 3: Mod assist Locomotion: Ambulation: 2: Travels 50 - 149 ft with moderate assistance (Pt: 50 - 74%)  Comprehension Comprehension Mode: Auditory Comprehension: 4-Understands basic 75 - 89% of the time/requires cueing 10 - 24% of the time  Expression Expression Mode: Verbal Expression: 5-Expresses basic 90% of the time/requires cueing < 10% of the time.  Social Interaction Social Interaction: 5-Interacts appropriately 90% of the time - Needs monitoring or encouragement for participation or interaction.  Problem Solving Problem Solving: 4-Solves basic 75 - 89% of the time/requires cueing 10 - 24% of the time  Memory Memory: 3-Recognizes or recalls 50 - 74% of the time/requires cueing 25 - 49% of the time  Medical Problem List and Plan: 1. Functional deficits secondary to debility after cute respiratory failure and CHF exacerbation s/p CABG.  -has persistent LUE weakness, ?lower cervical radic with ?neuropraxic ulnar nerve injury  -pt states that this was an issue before admission---i reviewed past imaging see no evidence of imaging or work up in epic  -continue to observe for now, could splint left hand if needed.  -follow up/work this up as an outpt after discharge  2. DVT Prophylaxis/Anticoagulation: Pharmaceutical: Lovenox due to VTE risk is required 3. Pain Management: oxycodone prn---observe activity tolerance with therapy 4. Mood: Increase Remeron to home dose to help manage insomnia and mood. Team to provide ego support. LCSW to follow for evaluation and support.  5. Neuropsych: This patient is capable of making decisions on his own behalf. 6. Skin/Wound Care: Routine pressure relief measures. Maintain adequate nutritional intake. Offer supplements between meals.  7. Fluids/Electrolytes/Nutrition: Monitor I/O. Daily labs to  monitor metabolic/electrolyte status 8. CAD s/p CABG: Continue sternal precautions. Continues to have incisional pain but no CP. On ASA, lipitor, coreg and Entresto.  9. Acute on systolic CHF: Monitor daily weights. Low salt diet.   aldactone, coreg, Entestor and digoxin.  -lasix resuming today  - Cardiology following also.   -all labs personally reviewed again today---need to keep an eye on bun/cr.  10 COPD: Avoiding Spiriva with BPH/voiding difficulties. Continue Brovan/pulmicort with prn Xopenex. To be discharged on symbicort 80/4.5 2 puffs bid + prn xopenex  -outpt follow up with pulmonary at dc  -lungs remain generally clear but with reduced air flow. Continue to work on activity tolerance  -continue oxygen as needed. Consider tapering oxygen next week if possible 11. ABLA: Slowly improving. Labs reviewed. Steady today 12. HTN: Soft blood pressures but asymptomatic at present. Observe for activity tolerance.  13. Urinary retention/BPH: continue Avodart. Would like to remove foley over next few days potentially  -ua unremarkable , ucx negative  14. Adjustment disorder: Mentation improving gradually. Continue Xanax for chronic anxiety/ adjustment reaction.  15. Reactive leucocytosis: Likely due to steroids/surgery. No signs of infection on exam. Continue to monitor 16. Stress induced hyperglycemia: Hgb A1C- 5.9. Will continue SSI with aggressive BS monitoring to help promote healing past CABG.    LOS (Days) 4 A FACE TO FACE EVALUATION WAS PERFORMED  Travis Romero T 11/08/2014 9:12 AM

## 2014-11-08 NOTE — Progress Notes (Addendum)
Occupational Therapy Session Note  Patient Details  Name: Arren Laminack MRN: [id number] Date of Birth: [birth year]  Today's Date: 11/08/2014 OT Individual Time: 0700-0800 OT Individual Time Calculation (min): 60 min    Short Term Goals: Week 1:  OT Short Term Goal 1 (Week 1): Pt will complete UB dressing with min A to increase functional independence.  OT Short Term Goal 2 (Week 1): Pt will complete LB dressing with mod A sit<>stand to increase functional independence.  OT Short Term Goal 3 (Week 1): Pt will complete functional transfers with mod A.  OT Short Term Goal 4 (Week 1): Pt will complete functional activity in standing for 30s with mod A.   Skilled Therapeutic Interventions/Progress Updates:    Pt resting in bed upon arrival and agreeable to getting OOB.  Pt declined bathing and changing clothes this morning despite encouragement.  Pt did agree to grooming tasks.  Pt noted with increased lethargy compared to yesterday morning.  Pt stated he thought he slept well during the night.  Pt stated he was thirsty and requested juice.  Discussed patient's request with RN who stated that goal was to keep CBG<100.  RN discussed with patient and explained that continued drinking of juices meant continued insulin shots.  Pt stated that was "ok" with him.  Pt performed sit<>stand with max A and max verbal cues to adhere to sternal precautions.  Pt noted with increased posterior lean when standing this morning.  Focus on activity tolerance, sit<>stand, standing balance, task initiation, sequencing, and safety awareness.  Therapy Documentation Precautions:  Precautions Precautions: Fall, Sternal Precaution Comments: watch HR  Restrictions Weight Bearing Restrictions: No   Pain: Pain Assessment Pain Assessment: No/denies pain  See FIM for current functional status  Therapy/Group: Individual Therapy  [name] 11/08/2014, 8:01 AM

## 2014-11-08 NOTE — Progress Notes (Signed)
Physical Therapy Session Note  Patient Details  Name: Travis Romero MRN: [id number] Date of Birth: [birth year]  Today's Date: 11/08/2014 PT Individual Time: 1400-1500 PT Individual Time Calculation (min): 60 min   Short Term Goals: Week 1:  PT Short Term Goal 1 (Week 1): pt will perform standing activity for 5 min with min assist PT Short Term Goal 2 (Week 1): pt will ambulate with no AD 25 feet with min assist PT Short Term Goal 3 (Week 1): pt will perform bed mobillity with supervision PT Short Term Goal 4 (Week 1): pt will negotiate two six inch steps using fingertip support on rails with min assist PT Short Term Goal 5 (Week 1): pt will perfrom stand pivot transfer with min assist  Skilled Therapeutic Interventions/Progress Updates:  Session focused on activity tolerance, stair training, and family education with sister about level of assistance and follow up recommendations needed at D/C, pt goals, and estimated length of stay. Pt tolerated session on room air in attempt to reduce amount of supplemental O2 required,Sp02 at rest before session=92%room air and after activity dropped to 83%, however returned back to 90% upon rest and verbal cuing for upright posture and pursed lip breathing. Pt negotiated 4 six inch stairs with min assist ascending and mod assist descending using both rails for UE touchdown support only. Pt demonstrated increased retropulsion today and required mod assist with transfers to lift and lower. Pt performed standing [place] exercises for strengthening and balance x 5 each bilat (hip Abd, mini squats, knee flex, heel toe raises). Pt performed pipe tree puzzle level easy, in standing for activity tolerance, cognition, and balance (with UE support) in more that reasonable amount of time, requiring frequent rest breaks, and min assist. Pt appeared to have decreased endurance and motivation today.  Pt left in room in wheelchair with quick release belt and returned to 2L O2  via n.c, and all needs in reach.  Therapy Documentation Precautions:  Precautions Precautions: Fall, Sternal Precaution Comments: watch HR  Restrictions Weight Bearing Restrictions: No (Sternal precautions) Vital Signs: Therapy Vitals Pulse Rate: (!) 110 (after activity ) Patient Position (if appropriate): Sitting SpO2: (!) 83 % (after activity) Pain: Pain Assessment Pain Assessment: Faces Faces Pain Scale: Hurts even more Pain Type: Acute pain Pain Location: Generalized Pain Orientation: Mid Pain Descriptors / Indicators: Aching Pain Intervention(s): Repositioned;Rest   Therapy/Group: Individual Therapy  Elsie Ra 11/08/2014, 4:38 PM

## 2014-11-09 ENCOUNTER — Inpatient Hospital Stay (HOSPITAL_COMMUNITY): Payer: PRIVATE HEALTH INSURANCE | Admitting: *Deleted

## 2014-11-09 ENCOUNTER — Inpatient Hospital Stay (HOSPITAL_COMMUNITY): Payer: PRIVATE HEALTH INSURANCE | Admitting: Speech Pathology

## 2014-11-09 ENCOUNTER — Inpatient Hospital Stay (HOSPITAL_COMMUNITY): Payer: PRIVATE HEALTH INSURANCE | Admitting: Physical Therapy

## 2014-11-09 ENCOUNTER — Inpatient Hospital Stay (HOSPITAL_COMMUNITY): Payer: PRIVATE HEALTH INSURANCE

## 2014-11-09 LAB — BASIC METABOLIC PANEL
ANION GAP: 8 (ref 5–15)
BUN: 31 mg/dL — AB (ref 6–20)
CO2: 31 mmol/L (ref 22–32)
Calcium: 9 mg/dL (ref 8.9–10.3)
Chloride: 95 mmol/L — ABNORMAL LOW (ref 101–111)
Creatinine, Ser: 1.04 mg/dL (ref 0.61–1.24)
GFR calc Af Amer: >60 mL/min (ref >60)
Glucose, Bld: 95 mg/dL (ref 65–99)
Potassium: 4.8 mmol/L (ref 3.5–5.1)
Sodium: 134 mmol/L — ABNORMAL LOW (ref 135–145)

## 2014-11-09 LAB — GLUCOSE, CAPILLARY
Glucose-Capillary: 94 mg/dL (ref 65–99)
Glucose-Capillary: 153 mg/dL — ABNORMAL HIGH (ref 65–99)
Glucose-Capillary: 123 mg/dL — ABNORMAL HIGH (ref 65–99)

## 2014-11-09 LAB — CARBOXYHEMOGLOBIN
CARBOXYHEMOGLOBIN: 1.9 % — AB (ref 0.5–1.5)
Methemoglobin: 1.1 % (ref 0.0–1.5)
O2 SAT: 52.1 %
TOTAL HEMOGLOBIN: 13 g/dL — AB (ref 13.5–18.0)

## 2014-11-09 NOTE — Progress Notes (Signed)
Physical Therapy Session Note  Patient Details  Name: Travis Romero MRN: [id number] Date of Birth: [birth year]  Today's Date: 11/09/2014 PT Individual Time: 4098-1191 PT Individual Time Calculation (min): 20 min   Skilled Therapeutic Interventions/Progress Updates:  Patient in room , very anxious, but agrees to therapy session, w/c propulsion to gym. Sit to stand training in II bars initiated, weight shifting R and L. Patient requests to return to room , as he needs to use the bathroom. Self propelled back to the room , transfer to the commode with mod A and use of grab bar. Assistance with donning pants and underwear. Patient has been on a toilet for an extended time , refuses to get up even after BM. Nurse contacted per patient request. Patient stated he needs his anxiety medicine and suppositories. Left in the bathroom with nurse tech in room to supervise.   Therapy Documentation Precautions:  Precautions Precautions: Fall, Sternal Precaution Comments: watch HR  Restrictions Weight Bearing Restrictions: No (Sternal precautions)  Pain: Pain Assessment Pain Assessment: 0-10 Pain Score: 3  Pain Type: Acute pain Pain Location: Generalized Pain Descriptors / Indicators: Aching Pain Frequency: Intermittent Pain Onset: Gradual Pain Intervention(s): Medication (See eMAR)   Therapy/Group: Individual Therapy  [name] 11/09/2014, 4:03 PM

## 2014-11-09 NOTE — Progress Notes (Signed)
Speech Language Pathology Daily Session Note  Patient Details  Name: Travis Romero MRN: [id number] Date of Birth: 06-20-1950  Today's Date: 11/09/2014 SLP Individual Time: 4627-0350 SLP Individual Time Calculation (min): 45 min  Short Term Goals: Week 1: SLP Short Term Goal 1 (Week 1): Patient will utilize external memory aids to recall new, daily information with Min A multimodal cues. SLP Short Term Goal 2 (Week 1): Patient will demonstrate functional problem for basic and familiar tasks with Min A multimodal cues.  SLP Short Term Goal 3 (Week 1): Patient will demonstrate sustained attention to a functional task for 60 minutes with Min A verbal cues for redirection.  SLP Short Term Goal 4 (Week 1): Patient will self-monitor and correct errors with functional tasks with Min A verbal and question cues.  SLP Short Term Goal 5 (Week 1): Patient will utilize an increased vocal intensity at the phrase level with Min A verbal cues to increase intelligibility to 100%.   Skilled Therapeutic Interventions: Skilled treatment session focused on cognitive goals. SLP facilitated session by providing Max A multimodal cues for utilization of external memory aids to increase recall of new and functional information in regards to medication recall of events from therapy sessions. Patient also required Max A verbal cues for organization of information. Patient utilized an increased vocal intensity at the sentence level with Min A verbal cues.   Patient left supine in bed with all needs within reach. Continue with current plan of care.   FIM:  Comprehension Comprehension Mode: Auditory Comprehension: 4-Understands basic 75 - 89% of the time/requires cueing 10 - 24% of the time Expression Expression Mode: Verbal Expression: 4-Expresses basic 75 - 89% of the time/requires cueing 10 - 24% of the time. Needs helper to occlude trach/needs to repeat words. Social Interaction Social Interaction: 5-Interacts  appropriately 90% of the time - Needs monitoring or encouragement for participation or interaction. Problem Solving Problem Solving: 3-Solves basic 50 - 74% of the time/requires cueing 25 - 49% of the time Memory Memory: 3-Recognizes or recalls 50 - 74% of the time/requires cueing 25 - 49% of the time  Pain Pain Assessment Pain Assessment: 0-10 Pain Score: 3  Pain Type: Acute pain Pain Location: Generalized Pain Descriptors / Indicators: Aching Pain Frequency: Intermittent Pain Onset: Gradual Pain Intervention(s): Medication (See eMAR)  Therapy/Group: Individual Therapy  Yania Bogie 11/09/2014, 3:47 PM

## 2014-11-09 NOTE — Progress Notes (Signed)
Occupational Therapy Session Note  Patient Details  Name: Tonnie Friedel MRN: [id number] Date of Birth: [birth year]  Today's Date: 11/09/2014 OT Individual Time: 0900-1000 OT Individual Time Calculation (min): 60 min    Short Term Goals: Week 1:  OT Short Term Goal 1 (Week 1): Pt will complete UB dressing with min A to increase functional independence.  OT Short Term Goal 2 (Week 1): Pt will complete LB dressing with mod A sit<>stand to increase functional independence.  OT Short Term Goal 3 (Week 1): Pt will complete functional transfers with mod A.  OT Short Term Goal 4 (Week 1): Pt will complete functional activity in standing for 30s with mod A.   Skilled Therapeutic Interventions/Progress Updates:    Pt engaged in BADL retraining including bathing and dressing with sit<>stand from w/c at sink.  Pt required max A and max multimodal cues for sequencing and technique with stand pivot transfer to w/c.  Pt continues to exhibit posterior lean when standing and during transfers.  Pt requires BUE support when standing and is currently unable to assist with bathing buttocks and pulling up pants.  Pt exhibits considerable posterior pelvic tilt when sitting in w/c.  Pt requires max verbal cues for task initiation and thoroughness with BADLs. Pt requires more than a reasonable amount of time to complete tasks. Focus on activity tolerance, sit<>stand, standing balance, functional transfers, task initiation, sequencing, and safety awareness.  Therapy Documentation Precautions:  Precautions Precautions: Fall, Sternal Precaution Comments: watch HR  Restrictions Weight Bearing Restrictions: No (Sternal precautions) Pain: Pain Assessment Pain Assessment: No/denies pain Pain Score: Asleep  See FIM for current functional status  Therapy/Group: Individual Therapy  [name] 11/09/2014, 10:03 AM

## 2014-11-09 NOTE — Progress Notes (Signed)
[place] PHYSICAL MEDICINE & REHABILITATION     PROGRESS NOTE    Subjective/Complaints: Patient has no complaints. Feels well.   Objective: Vital Signs: Blood pressure 155/81, pulse 103, temperature 97.4 F (36.3 C), temperature source Oral, resp. rate 17, height 5\' 7"  (1.702 m), weight 130 lb 4.7 oz (59.1 kg), SpO2 93 %.  atient appears older than his stated age of 55. No acute distress. Chest clear to auscultation cardiac exam  hyperdynamic with a 2/6 holosystolic murmur. Abdominal exam active bowel sounds, soft Extremities without edema  Assessment/Plan: 1. Functional deficits secondary to debility after CABG and multiple medical    Medical Problem List and Plan: 1. Functional deficits secondary to debility after acute respiratory failure and CHF exacerbation s/p CABG.  -has persistent LUE weakness, ? -continue to observe for now, could splint left hand if needed.  -follow up/work this up as an outpt after discharge  2. DVT Prophylaxis/Anticoagulation: Pharmaceutical: Lovenox due to VTE risk is required 3. Pain Management: oxycodone prn---observe activity tolerance with therapy 4. Mood: Increase Remeron to home dose to help manage insomnia and mood. Team to provide ego support. LCSW to follow for evaluation and support.  5. Neuropsych: This patient is capable of making decisions on his own behalf. 6. Skin/Wound Care: Routine pressure relief measures. Maintain adequate nutritional intake. Offer supplements between meals.  7. Fluids/Electrolytes/Nutrition: Monitor I/O. Daily labs to monitor metabolic/electrolyte status Basic Metabolic Panel:    Component Value Date/Time   NA 134* 11/09/2014 0500   K 4.8 11/09/2014 0500   CL 95* 11/09/2014 0500   CO2 31 11/09/2014 0500   BUN 31* 11/09/2014 0500   CREATININE 1.04 11/09/2014 0500   GLUCOSE 95 11/09/2014 0500   CALCIUM 9.0 11/09/2014 0500   8. CAD s/p CABG: Continue sternal precautions. Continues to have incisional  pain but no CP. On ASA, lipitor, coreg and Entresto.  9. Acute on systolic CHF: Monitor daily weights. Low salt diet.   aldactone, coreg, Entestor and digoxin.  -lasix resuming today  - Cardiology following also.    10 COPD: Avoiding Spiriva with BPH/voiding difficulties. Continue Brovan/pulmicort with prn Xopenex. To be discharged on symbicort 80/4.5 2 puffs bid + prn xopenex  -outpt follow up with pulmonary at dc  -lungs remain generally clear but with reduced air flow. Continue to work on activity tolerance  -continue oxygen as needed. Consider tapering oxygen next week if possible 11. ABLA: Slowly improving. Labs reviewed. Steady today CBC:    Component Value Date/Time   WBC 12.1* 11/06/2014 0535   HGB 10.5* 11/06/2014 0535   HCT 33.2* 11/06/2014 0535   PLT 430* 11/06/2014 0535   MCV 109.2* 11/06/2014 0535   NEUTROABS 7.8* 11/05/2014 0558   LYMPHSABS 1.4 11/05/2014 0558   MONOABS 1.9* 11/05/2014 0558   EOSABS 0.3 11/05/2014 0558   BASOSABS 0.1 11/05/2014 0558     12. HTN: asymptomatic at present. Observe for activity tolerance. 110/70-155/81 13. Urinary retention/BPH: continue Avodart. Would like to remove foley over next few days potentially  -ua unremarkable , ucx negative  14. Adjustment disorder: Mentation improving gradually. Continue Xanax for chronic anxiety/ adjustment reaction.  15. Reactive leucocytosis: Likely due to steroids/surgery.  16. Stress induced hyperglycemia: Hgb A1C- 5.9. Will continue SSI with aggressive BS monitoring to help promote healing past CABG.  CBG (last 3)   Recent Labs  11/08/14 1646 11/08/14 2029 11/09/14 0728  GLUCAP 118* 121* 94      LOS (Days) 5 A FACE TO FACE EVALUATION  [place] 11/09/2014 9:56 AM

## 2014-11-09 NOTE — Progress Notes (Signed)
Physical Therapy Session Note  Patient Details  Name: Travis Romero MRN: [id number] Date of Birth: 06-08-1950  Today's Date: 11/09/2014 PT Individual Time: 1400-1500 PT Individual Time Calculation (min): 60 min   Short Term Goals: Week 1:  PT Short Term Goal 1 (Week 1): pt will perform standing activity for 5 min with min assist PT Short Term Goal 2 (Week 1): pt will ambulate with no AD 25 feet with min assist PT Short Term Goal 3 (Week 1): pt will perform bed mobillity with supervision PT Short Term Goal 4 (Week 1): pt will negotiate two six inch steps using fingertip support on rails with min assist PT Short Term Goal 5 (Week 1): pt will perfrom stand pivot transfer with min assist  Skilled Therapeutic Interventions/Progress Updates:  Pt was seen bedside in the pm. Pt willing to participate with therapy. Pt transported to rehab gym. Pt performed multiple sit to stand transfers with min to mod A and verbal cues to maintain precautions. While standing treatment focused on NMR to include weight shifting, upright position and unilateral stance. Pt performed step ups 3 sets x 5 reps each. Pt ambulated without assistive device about 50 feet with max A and verbal cues. Pt returned to room. Pt performed LE exercises in w/c 2 sets x 10 reps for LAQs and hip flex. O2 sats monitored throughout treatment, greater than 90% with O2 at 2 liters. HR in mid 90 to low 100s at rest increased to 110 following ambulation. Pt left sitting up in w/c with quick release belt in place.   Therapy Documentation Precautions:  Precautions Precautions: Fall, Sternal Precaution Comments: watch HR  Restrictions Weight Bearing Restrictions: No (Sternal precautions) General:  Pain: Generalized c/o aching all over.    Locomotion : Ambulation Ambulation/Gait Assistance: Other (comment);2: Max assist (no assistive device)   Therapy/Group: Individual Therapy  Dub Amis 11/09/2014, 3:07 PM

## 2014-11-10 ENCOUNTER — Inpatient Hospital Stay (HOSPITAL_COMMUNITY): Payer: PRIVATE HEALTH INSURANCE | Admitting: Physical Therapy

## 2014-11-10 LAB — BASIC METABOLIC PANEL
Anion gap: 6 (ref 5–15)
BUN: 33 mg/dL — ABNORMAL HIGH (ref 6–20)
CO2: 31 mmol/L (ref 22–32)
Calcium: 9.2 mg/dL (ref 8.9–10.3)
Chloride: 98 mmol/L — ABNORMAL LOW (ref 101–111)
Creatinine, Ser: 1.04 mg/dL (ref 0.61–1.24)
GFR calc Af Amer: >60 mL/min (ref >60)
GFR calc non Af Amer: >60 mL/min (ref >60)
GLUCOSE: 88 mg/dL (ref 65–99)
Potassium: 4.7 mmol/L (ref 3.5–5.1)
SODIUM: 135 mmol/L (ref 135–145)

## 2014-11-10 LAB — GLUCOSE, CAPILLARY
Glucose-Capillary: 91 mg/dL (ref 65–99)
Glucose-Capillary: 189 mg/dL — ABNORMAL HIGH (ref 65–99)
Glucose-Capillary: 126 mg/dL — ABNORMAL HIGH (ref 65–99)
Glucose-Capillary: 136 mg/dL — ABNORMAL HIGH (ref 65–99)

## 2014-11-10 MED ORDER — SPIRONOLACTONE 25 MG PO TABS
12.5000 mg | ORAL_TABLET | Freq: Every day | ORAL | Status: DC
  Administered 2014-11-11 – 2014-11-26 (×16): 12.5 mg via ORAL
  Filled 2014-11-10 (×16): qty 1

## 2014-11-10 NOTE — Progress Notes (Signed)
Spot checked O2 at HS=92%RA. Requested snack, but didn't eat. Bilateral shins red, warm to touch with scattered scabs. RLE with mild pitting edema. Heels boggy and elevated off bed with pillows. At 2117 PRN xanax given for anxiety and PRN ultram given for "pain all over." Resting quietly without complaint, since PRN meds given. Obvious memory deficits. At times will ask same question, over and over, during one interaction. Travis Romero A

## 2014-11-10 NOTE — Progress Notes (Addendum)
   Chart reviewed. Labs stable. Weight continues to drop. K 4.7. I cut spiro back to 12.5 daily. May need to cut lasix back. I will follow-up in am.  Rodina Pinales,MD 9:30 AM

## 2014-11-10 NOTE — Progress Notes (Signed)
Physical Therapy Session Note  Patient Details  Name: Travis Romero MRN: [id number] Date of Birth: 25-Aug-1950  Today's Date: 11/10/2014 PT Individual Time: 0810-0900 PT Individual Time Calculation (min): 50 min   Short Term Goals: Week 1:  PT Short Term Goal 1 (Week 1): pt will perform standing activity for 5 min with min assist PT Short Term Goal 2 (Week 1): pt will ambulate with no AD 25 feet with min assist PT Short Term Goal 3 (Week 1): pt will perform bed mobillity with supervision PT Short Term Goal 4 (Week 1): pt will negotiate two six inch steps using fingertip support on rails with min assist PT Short Term Goal 5 (Week 1): pt will perfrom stand pivot transfer with min assist  Skilled Therapeutic Interventions/Progress Updates:  Pt was seen bedside in the am. Pt missed 10 minutes due to eating breakfast. Pt transferred supine to edge of bed with head of bed elevated and mod A with verbal cues. Pt transferred sit to stand from edge of bed. Pt required 3 attempts to stand due to posterior weight shift. Pt transferred edge of bed to w/c with mod A and verbal cues. In gym treatment focused on sit to stand transfers. Pt performed multiple sit to stand transfers with min to mod A and verbal cues for technique to get weight shifted forward prior to standing. Pt ambulated without assistive device 50 feet x 2 with max  A and verbal cues. O2 sat remained above 90% during treatment on 2 liters nasal canula. Following treatment pt returned to room and left sitting up in w/c with quick release belt in place and call bell within reach.   Therapy Documentation Precautions:  Precautions Precautions: Fall, Sternal Precaution Comments: watch HR  Restrictions Weight Bearing Restrictions: No (Sternal precautions) General: PT Amount of Missed Time (min): 10 Minutes Vital Signs:  Pain: No c/o pain.    Locomotion : Ambulation Ambulation/Gait Assistance: 2: Max assist   See FIM for current  functional status  Therapy/Group: Individual Therapy  Dub Amis 11/10/2014, 12:17 PM

## 2014-11-10 NOTE — Progress Notes (Signed)
      Subjective/Complaints:  Patient has no new complaints.    Objective: Vital Signs: Blood pressure 123/72, pulse 100, temperature 97.5 F (36.4 C), temperature source Axillary, resp. rate 18, height 5\' 7"  (1.702 m), weight 126 lb 1.7 oz (57.2 kg), SpO2 100 %.  Patient appears older than his stated age.  No acute distress. HEENT moist Chest clear to auscultation cardiac exam  hyperdynamic with a 2/6 holosystolic murmur.  Abdominal exam active bowel sounds, soft Extremities without edema  Assessment/Plan:  1. Functional deficits secondary to debility after acute respiratory failure and CHF exacerbation s/p CABG.  -has persistent LUE weakness, ? -continue to observe for now, could splint left hand if needed.  -follow up/work this up as an outpt after discharge  2. DVT Prophylaxis/Anticoagulation: Pharmaceutical: Lovenox due to VTE risk is required 3. Pain Management: oxycodone prn---observe activity tolerance with therapy 4. Mood: Increase Remeron to home dose to help manage insomnia and mood. Team to provide ego support. LCSW to follow for evaluation and support.  5. Neuropsych: This patient is capable of making decisions on his own behalf. 6. Skin/Wound Care: Routine pressure relief measures. Maintain adequate nutritional intake. Offer supplements between meals.  7. Fluids/Electrolytes/Nutrition: Monitor I/O. Daily labs to monitor metabolic/electrolyte status Basic Metabolic Panel:    Component Value Date/Time   NA 135 11/10/2014 0542   K 4.7 11/10/2014 0542   CL 98* 11/10/2014 0542   CO2 31 11/10/2014 0542   BUN 33* 11/10/2014 0542   CREATININE 1.04 11/10/2014 0542   GLUCOSE 88 11/10/2014 0542   CALCIUM 9.2 11/10/2014 0542   8. CAD s/p CABG: Continue sternal precautions. Continues to have incisional pain but no CP. On ASA, lipitor, coreg and Entresto.  9. Acute on systolic CHF: Monitor daily weights. Low salt diet.   aldactone, coreg, Entestor and  digoxin.  -lasix resuming today  - Cardiology following also.    10 COPD: Avoiding Spiriva with BPH/voiding difficulties. Continue Brovan/pulmicort with prn Xopenex. To be discharged on symbicort 80/4.5 2 puffs bid + prn xopenex  -outpt follow up with pulmonary at dc  -lungs remain generally clear but with reduced air flow. Continue to work on activity tolerance  -continue oxygen as needed. Consider tapering oxygen next week if possible 11. ABLA: Slowly improving. Labs reviewed. Steady today CBC:    Component Value Date/Time   WBC 12.1* 11/06/2014 0535   HGB 10.5* 11/06/2014 0535   HCT 33.2* 11/06/2014 0535   PLT 430* 11/06/2014 0535   MCV 109.2* 11/06/2014 0535   NEUTROABS 7.8* 11/05/2014 0558   LYMPHSABS 1.4 11/05/2014 0558   MONOABS 1.9* 11/05/2014 0558   EOSABS 0.3 11/05/2014 0558   BASOSABS 0.1 11/05/2014 0558     12. HTN: asymptomatic at present. Observe for activity tolerance. 110/70-155/81 13. Urinary retention/BPH: continue Avodart. Would like to remove foley over next few days potentially  -ua unremarkable , ucx negative  14. Adjustment disorder: Mentation improving gradually. Continue Xanax for chronic anxiety/ adjustment reaction.  15. Reactive leucocytosis: Likely due to steroids/surgery.  16. Stress induced hyperglycemia: Hgb A1C- 5.9. Will continue SSI with aggressive BS monitoring to help promote healing past CABG.  CBG (last 3)   Recent Labs  11/10/14 0651 11/10/14 1103 11/10/14 1610  GLUCAP 91 189* 126*    Cont current Rx  LOS (Days) 6 A FACE TO FACE EVALUATION WAS PERFORMED  Travis Romero 11/10/2014 4:24 PM

## 2014-11-11 ENCOUNTER — Inpatient Hospital Stay (HOSPITAL_COMMUNITY): Payer: PRIVATE HEALTH INSURANCE | Admitting: Speech Pathology

## 2014-11-11 ENCOUNTER — Inpatient Hospital Stay (HOSPITAL_COMMUNITY): Payer: PRIVATE HEALTH INSURANCE

## 2014-11-11 ENCOUNTER — Inpatient Hospital Stay (HOSPITAL_COMMUNITY): Payer: PRIVATE HEALTH INSURANCE | Admitting: Rehabilitation

## 2014-11-11 ENCOUNTER — Encounter (HOSPITAL_COMMUNITY): Payer: PRIVATE HEALTH INSURANCE

## 2014-11-11 DIAGNOSIS — J42 Unspecified chronic bronchitis: Secondary | ICD-10-CM

## 2014-11-11 DIAGNOSIS — I5022 Chronic systolic (congestive) heart failure: Secondary | ICD-10-CM | POA: Insufficient documentation

## 2014-11-11 LAB — GLUCOSE, CAPILLARY
GLUCOSE-CAPILLARY: 132 mg/dL — AB (ref 65–99)
GLUCOSE-CAPILLARY: 130 mg/dL — AB (ref 65–99)
Glucose-Capillary: 160 mg/dL — ABNORMAL HIGH (ref 65–99)
Glucose-Capillary: 114 mg/dL — ABNORMAL HIGH (ref 65–99)
Glucose-Capillary: 125 mg/dL — ABNORMAL HIGH (ref 65–99)

## 2014-11-11 LAB — BASIC METABOLIC PANEL
ANION GAP: 9 (ref 5–15)
BUN: 32 mg/dL — ABNORMAL HIGH (ref 6–20)
CHLORIDE: 96 mmol/L — AB (ref 101–111)
CO2: 29 mmol/L (ref 22–32)
Calcium: 8.9 mg/dL (ref 8.9–10.3)
Creatinine, Ser: 1.07 mg/dL (ref 0.61–1.24)
GFR calc Af Amer: >60 mL/min (ref >60)
GFR calc non Af Amer: >60 mL/min (ref >60)
Glucose, Bld: 122 mg/dL — ABNORMAL HIGH (ref 65–99)
POTASSIUM: 4.3 mmol/L (ref 3.5–5.1)
Sodium: 134 mmol/L — ABNORMAL LOW (ref 135–145)

## 2014-11-11 MED ORDER — LIDOCAINE HCL 2 % EX GEL
CUTANEOUS | Status: DC | PRN
  Administered 2014-11-17 – 2014-11-20 (×2): 5 via TOPICAL
  Filled 2014-11-11: qty 5

## 2014-11-11 NOTE — Progress Notes (Signed)
PHYSICAL MEDICINE & REHABILITATION     PROGRESS NOTE    Subjective/Complaints: When can foley come out?""  ROS: Pt denies fever, rash/itching, headache, blurred or double vision, nausea, vomiting, abdominal pain, diarrhea, chest pain,  palpitations, dysuria, dizziness,    Objective: Vital Signs: Blood pressure 148/88, pulse 102, temperature 98.2 F (36.8 C), temperature source Oral, resp. rate 18, height 5\' 7"  (1.702 m), weight 57 kg (125 lb 10.6 oz), SpO2 94 %. No results found. No results for input(s): WBC, HGB, HCT, PLT in the last 72 hours.  Recent Labs  11/10/14 0542 11/11/14 0600  NA 135 134*  K 4.7 4.3  CL 98* 96*  GLUCOSE 88 122*  BUN 33* 32*  CREATININE 1.04 1.07  CALCIUM 9.2 8.9   CBG (last 3)   Recent Labs  11/10/14 1610 11/10/14 2107 11/11/14 0643  GLUCAP 126* 136* 114*    Wt Readings from Last 3 Encounters:  11/11/14 57 kg (125 lb 10.6 oz)  11/04/14 56.2 kg (123 lb 14.4 oz)  10/07/14 68.493 kg (151 lb)    Physical Exam:  Constitutional: He is oriented to person, place, and time. He appears well-developed and well-nourished.  Frail appearing male  HENT: oral mucosa a little dry Head: Normocephalic and atraumatic.  Eyes: Conjunctivae are normal. Pupils are equal, round, and reactive to light.  Neck: Normal range of motion. Neck supple.  Cardiovascular: Normal rate and regular rhythm. no murmurs, rubs, gallops Respiratory: Effort normal. Fair air movement. ?decrease in RUL. He has no wheezes. No distress GI: Soft. Bowel sounds are normal. He exhibits no distension. There is no tenderness.  Musculoskeletal: He exhibits trace tibial edema.Marland Kitchen He exhibits no tenderness.  Neurological: He is alert and oriented to person, place, and time. Reasonable insight and awareness.  Dysphonic speech  improving. Able to follow one and two step commands without difficulty.  UE: 4- deltoid, 4 bicep,right  tricep, wrist, hi LUE: tricep 3+ to 4-,  wrist 3+, HI 3, 4th/5th finger drop. LE: 3 hf, 3+ ke and 4 adf/apf  Skin: Skin is warm and dry.  Dry skin bilateral shins. RLE incisions healing well--clean,dry and intact. Small scabbed abrasion below left knee laterally.chest wall incision intact  Psychiatric: Thought content generally normal. Pleasant mood.   Assessment/Plan: 1. Functional deficits secondary to debility after CABG and multiple medical  which require 3+ hours per day of interdisciplinary therapy in a comprehensive inpatient rehab setting. Physiatrist is providing close team supervision and 24 hour management of active medical problems listed below. Physiatrist and rehab team continue to assess barriers to discharge/monitor patient progress toward functional and medical goals. FIM: FIM - Bathing Bathing Steps Patient Completed: Chest, Right Arm, Left Arm, Abdomen, Front perineal area, Right upper leg, Left upper leg Bathing: 3: Mod-Patient completes 5-7 76f 10 parts or 50-74%  FIM - Upper Body Dressing/Undressing Upper body dressing/undressing steps patient completed: Thread/unthread right sleeve of front closure shirt/dress, Thread/unthread left sleeve of front closure shirt/dress, Pull shirt around back of front closure shirt/dress Upper body dressing/undressing: 4: Min-Patient completed 75 plus % of tasks FIM - Lower Body Dressing/Undressing Lower body dressing/undressing: 1: Total-Patient completed less than 25% of tasks  FIM - Toileting Toileting: 0: Activity did not occur  FIM - Air cabin crew Transfers: 2-To toilet/BSC: Max A (lift and lower assist), 2-From toilet/BSC: Max A (lift and lower assist)  FIM - Engineer, site Assistive Devices: HOB elevated Bed/Chair Transfer: 3: Supine > Sit: Mod A (lifting assist/Pt.  50-74%/lift 2 legs, 3: Chair or W/C > Bed: Mod A (lift or lower assist)  FIM - Locomotion: Wheelchair Distance: 50 ft Locomotion: Wheelchair: 0: Activity did not  occur FIM - Locomotion: Ambulation Locomotion: Ambulation Assistive Devices: Other (comment) (no AD) Ambulation/Gait Assistance: 2: Max assist Locomotion: Ambulation: 2: Travels 50 - 149 ft with maximal assistance (Pt: 25 - 49%)  Comprehension Comprehension Mode: Auditory Comprehension: 4-Understands basic 75 - 89% of the time/requires cueing 10 - 24% of the time  Expression Expression Mode: Verbal Expression: 4-Expresses basic 75 - 89% of the time/requires cueing 10 - 24% of the time. Needs helper to occlude trach/needs to repeat words.  Social Interaction Social Interaction: 5-Interacts appropriately 90% of the time - Needs monitoring or encouragement for participation or interaction.  Problem Solving Problem Solving: 4-Solves basic 75 - 89% of the time/requires cueing 10 - 24% of the time  Memory Memory: 3-Recognizes or recalls 50 - 74% of the time/requires cueing 25 - 49% of the time  Medical Problem List and Plan: 1. Functional deficits secondary to debility after acute respiratory failure and CHF exacerbation s/p CABG.  -  -pt states that this was an issue before admission---i reviewed past imaging see no evidence of imaging or work up in epic  -continue to observe for now, could splint left hand if needed.  -follow up/work this up as an outpt after discharge  2. DVT Prophylaxis/Anticoagulation: Pharmaceutical: Lovenox due to VTE risk is required 3. Pain Management: oxycodone prn---observe activity tolerance with therapy 4. Mood: Increase Remeron to home dose to help manage insomnia and mood. Team to provide ego support. LCSW to follow for evaluation and support.  5. Neuropsych: This patient is capable of making decisions on his own behalf. 6. Skin/Wound Care: Routine pressure relief measures. Maintain adequate nutritional intake. Offer supplements between meals.  7. Fluids/Electrolytes/Nutrition: Monitor I/O. Daily labs to monitor metabolic/electrolyte status 8. CAD s/p  CABG: Continue sternal precautions. Continues to have incisional pain but no CP. On ASA, lipitor, coreg and Entresto.  9. Acute on systolic CHF: Monitor daily weights. Low salt diet.   aldactone, coreg, Entestor and digoxin.  -lasix 40mg   - Cardiology following also. Appreciate support  -all labs personally reviewed again today---need to keep an eye on bun/cr.  10 COPD: Avoiding Spiriva with BPH/voiding difficulties. Continue Brovan/pulmicort with prn Xopenex. To be discharged on symbicort 80/4.5 2 puffs bid + prn xopenex  -outpt follow up with pulmonary at dc  -lungs remain generally clear but with reduced air flow. Continue to work on activity tolerance  -continue oxygen as needed. Consider tapering oxygen next week if possible 11. ABLA: Slowly improving. Labs reviewed. Steady today 12. HTN: Soft blood pressures but asymptomatic at present. Observe for activity tolerance.  13. Urinary retention/BPH: continue Avodart.  remove foley for voiding trial today  -ua unremarkable , ucx negative  14. Adjustment disorder: Mentation improving gradually. Affect is bright this am.  Continue Xanax for chronic anxiety/ adjustment reaction.  15. Reactive leucocytosis: Likely due to steroids/surgery. No signs of infection on exam. Continue to monitor 16. Stress induced hyperglycemia: Hgb A1C- 5.9. Improved    LOS (Days) 7 A FACE TO FACE EVALUATION WAS PERFORMED  Alysia Penna E 11/11/2014 8:50 AM

## 2014-11-11 NOTE — Plan of Care (Signed)
Problem: RH PAIN MANAGEMENT Goal: RH STG PAIN MANAGED AT OR BELOW PT'S PAIN GOAL <5  Outcome: Not Progressing Reports pain as 7/8

## 2014-11-11 NOTE — Plan of Care (Signed)
Problem: RH PAIN MANAGEMENT Goal: RH STG PAIN MANAGED AT OR BELOW PT'S PAIN GOAL <5  Outcome: Not Progressing Consistently rates pain 7 or higher.

## 2014-11-11 NOTE — Progress Notes (Signed)
Physical Therapy Session Note  Patient Details  Name: Travis Romero MRN: [id number] Date of Birth: 08-Jul-1950  Today's Date: 11/11/2014 PT Individual Time: 1100-1130 PT Individual Time Calculation (min): 30 min   Short Term Goals: Week 1:  PT Short Term Goal 1 (Week 1): pt will perform standing activity for 5 min with min assist PT Short Term Goal 2 (Week 1): pt will ambulate with no AD 25 feet with min assist PT Short Term Goal 3 (Week 1): pt will perform bed mobillity with supervision PT Short Term Goal 4 (Week 1): pt will negotiate two six inch steps using fingertip support on rails with min assist PT Short Term Goal 5 (Week 1): pt will perfrom stand pivot transfer with min assist  Skilled Therapeutic Interventions/Progress Updates:   Session focused on addressing functional sit to stands, activity tolerance/endurance, and dynamic standing balance. Pt requires min to mod A for sit to stand and mod to max A for dynamic standing balance (during marching activity) during weightshifting without UE support with therapist facilitating at hips and providing support at trunk for upright posture. Verbal cues for upright posture, knee extension (maintains flexed) and weightshifting to accommodate for LOB and posterior lean. Rest breaks as needed due to low activity tolerance. Continue PT POC.  Therapy Documentation Precautions:  Precautions Precautions: Fall, Sternal Precaution Comments: watch HR  Restrictions Weight Bearing Restrictions: No (Sternal precautions) Vital Signs: Therapy Vitals Pulse Rate: (!) 101 BP: 119/68 mmHg Patient Position (if appropriate): Sitting Oxygen Therapy SpO2: 100 % O2 Device: Nasal Cannula O2 Flow Rate (L/min): 2 L/min Pain: 8/10 pain "all over" - RN made aware and medication given.   See FIM for current functional status  Therapy/Group: Individual Therapy  Canary Brim Ivory Broad, PT, DPT  11/11/2014, 12:20 PM

## 2014-11-11 NOTE — Progress Notes (Signed)
Occupational Therapy Session Note  Patient Details  Name: Travis Romero MRN: [id number] Date of Birth: [birth year]  Today's Date: 11/11/2014 OT Individual Time: 0700-0800 OT Individual Time Calculation (min): 60 min    Short Term Goals: Week 1:  OT Short Term Goal 1 (Week 1): Pt will complete UB dressing with min A to increase functional independence.  OT Short Term Goal 2 (Week 1): Pt will complete LB dressing with mod A sit<>stand to increase functional independence.  OT Short Term Goal 3 (Week 1): Pt will complete functional transfers with mod A.  OT Short Term Goal 4 (Week 1): Pt will complete functional activity in standing for 30s with mod A.   Skilled Therapeutic Interventions/Progress Updates:    Pt engaged in BADL retraining including bathing and dressing with sit<>stand at sink.  Pt declined shower this morning. Focus on activity tolerance, sit<>stand, standing balance, task initiation, sequencing, and safety awareness.  Pt continues to require max verbal cues for task initiation and sequencing. Pt requires max multimodal cues for sit<>stand and standing balance.  Pt continues to exhibit significant posterior lean when performing sit<>stand and while standing.  Pt requires BUE support when standing and is unable to assist with bathing buttocks or pull up pants.  Pt requires more than a reasonable amount of time to initiate and complete all tasks.  Pt requires max verbal cues for sternal precautions when engaged in a functional task.    Therapy Documentation Precautions:  Precautions Precautions: Fall, Sternal Precaution Comments: watch HR  Restrictions Weight Bearing Restrictions: No (sternal precautions) General:   Pain: Pain Assessment Pain Assessment: 0-10 Pain Score: 8  Pain Type: Acute pain Pain Location: Generalized Pain Descriptors / Indicators: Aching Pain Frequency: Intermittent Pain Onset: On-going Pain Intervention(s): RN made aware;Emotional  support Multiple Pain Sites: No  See FIM for current functional status  Therapy/Group: Individual Therapy  [name] 11/11/2014, 8:59 AM

## 2014-11-11 NOTE — Progress Notes (Signed)
Speech Language Pathology Daily Session Note  Patient Details  Name: Travis Romero MRN: [id number] Date of Birth: Dec 23, 1950  Today's Date: 11/11/2014  Session 1 SLP Individual Time: 9675-9163 SLP Individual Time Calculation (min): 30 min Session 1 SLP Individual Time: 8466-5993 SLP Individual Time Calculation (min): 30 min  Short Term Goals: Week 1: SLP Short Term Goal 1 (Week 1): Patient will utilize external memory aids to recall new, daily information with Min A multimodal cues. SLP Short Term Goal 2 (Week 1): Patient will demonstrate functional problem for basic and familiar tasks with Min A multimodal cues.  SLP Short Term Goal 3 (Week 1): Patient will demonstrate sustained attention to a functional task for 60 minutes with Min A verbal cues for redirection.  SLP Short Term Goal 4 (Week 1): Patient will self-monitor and correct errors with functional tasks with Min A verbal and question cues.  SLP Short Term Goal 5 (Week 1): Patient will utilize an increased vocal intensity at the phrase level with Min A verbal cues to increase intelligibility to 100%.   Skilled Therapeutic Interventions: Session 1 Skilled treatment session focused on addressing speech goals, with regard to vocal hygiene for optimizing overall intelligibility. SLP facilitated session by providing verbal education with teach back opportunities and a handout for reinforcement of vocal hygiene strategies.  Following SLP instruction patient was able to recall 3 changes he could implement with use of provided handout to recall.  Patient also utilized planner with Min question cues to recall length of stay and current goals of therapies.  Continue with current plan of care.   Session 2 Skilled treatment session focused on addressing cognitive goals. SLP facilitated session by providing Max assist multimodal cues for utilization of external memory aids to recall daily events from therapy sessions, due to patient not taking  notes or knowing where his folder was. As a result, SLP facilitated session with Total assist for reorganization of information, which patient was then able to utilize.  Patient was able to utilize planner to recall last time pain medication was administered and identify when he could have it again with increased time.  Patient expressed concern regarding not knowing all of his current medications and requested that be addressed during the next session.     FIM:  Comprehension Comprehension Mode: Auditory Comprehension: 5-Follows basic conversation/direction: With extra time/assistive device Expression Expression Mode: Verbal Expression: 4-Expresses basic 75 - 89% of the time/requires cueing 10 - 24% of the time. Needs helper to occlude trach/needs to repeat words. Social Interaction Social Interaction: 5-Interacts appropriately 90% of the time - Needs monitoring or encouragement for participation or interaction. Problem Solving Problem Solving: 4-Solves basic 75 - 89% of the time/requires cueing 10 - 24% of the time Memory Memory: 4-Recognizes or recalls 75 - 89% of the time/requires cueing 10 - 24% of the time FIM - Eating Eating Activity: 5: Set-up assist for open containers  Pain Pain Assessment Pain Assessment: No/denies pain x2  Therapy/Group: Individual Therapy x2  Gunnar Fusi, M.A., [place] [phone]  [place] 11/11/2014, 12:14 PM

## 2014-11-11 NOTE — Progress Notes (Signed)
Physical Therapy Session Note  Patient Details  Name: Travis Romero MRN: [id number] Date of Birth: Jun 05, 1950  Today's Date: 11/11/2014 PT Individual Time: 1400-1500 PT Individual Time Calculation (min): 60 min   Short Term Goals: Week 2:     Skilled Therapeutic Interventions/Progress Updates:   Pt received sitting in w/c in room, agreeable to therapy session.  Pt wanting to locate folders in room and note OTAGO HEP, therefore removed and brought to session to complete during session.  Pt self propelled to therapy gym x 100' using BLEs due to sternal precautions at min A level with cues for improved technique and to sit tall at [facility] for better technique.  Note pt needing increased time to complete task due to fatigue.  Once in therapy gym, focused on OTAGO HEP and gait training with use of RW.  Performed exercise as follows; seated LAQ BLE x 10 reps, standing hip abd x 10 reps, standing knee flex x 10 reps, mini squats and heel/toe raises x 10 reps with light support on // bars for balance.  Pt requires 2 seated rest breaks during HEP due to fatigue.  Remainder of session focused on gait training x 2 reps of 60' with use of RW at min A level.  Provided demonstration and cues for maintaining sternal precautions throughout as well as continuous education on sit<>stand with hands on thighs to maintain sternal precautions as well.  Continue to note delayed processing and decreased memory during session however with min questioning cues could better state precautions and technique for sit<>stand.  Pt assisted back to room and transferred to bed with use of RW at min A level.  Continues to also require cues for forward weight shift, but retropulsion improved during session.  Left in bed with all needs in reach.    Therapy Documentation Precautions:  Precautions Precautions: Fall, Sternal Precaution Comments: watch HR  Restrictions Weight Bearing Restrictions: No (Sternal precautions)   Vital  Signs: Therapy Vitals Pulse Rate: (!) 101 BP: 119/68 mmHg Patient Position (if appropriate): Sitting Oxygen Therapy SpO2: 100 % O2 Device: Nasal Cannula O2 Flow Rate (L/min): 2 L/min Pain: Pain Assessment Pain Assessment: No/denies pain  See FIM for current functional status  Therapy/Group: Individual Therapy  Denice Bors 11/11/2014, 2:55 PM

## 2014-11-11 NOTE — Progress Notes (Signed)
Advanced Heart Failure Rounding Note   Subjective:    In rehab.Getting stronger. No CP or SOB. Weight stable    Objective:   Weight Range:  Vital Signs:   Temp:  [97.8 F (36.6 C)-98.2 F (36.8 C)] 97.8 F (36.6 C) (08/08 1511) Pulse Rate:  [97-104] 97 (08/08 1511) Resp:  [18] 18 (08/08 1511) BP: (119-149)/(68-88) 134/70 mmHg (08/08 1511) SpO2:  [87 %-100 %] 96 % (08/08 1511) Weight:  [57 kg (125 lb 10.6 oz)] 57 kg (125 lb 10.6 oz) (08/08 0528) Last BM Date: 11/09/14  Weight change: Filed Weights   11/09/14 0635 11/10/14 0640 11/11/14 0528  Weight: 59.1 kg (130 lb 4.7 oz) 57.2 kg (126 lb 1.7 oz) 57 kg (125 lb 10.6 oz)    Intake/Output:   Intake/Output Summary (Last 24 hours) at 11/11/14 1940 Last data filed at 11/11/14 1854  Gross per 24 hour  Intake    960 ml  Output   2600 ml  Net  -1640 ml     Physical Exam: General: . No resp difficulty. Sitting in the chair eating. Hoarse voice. HEENT: normal Neck: supple. JVP 7-8. Carotids 2+ bilat; no bruits. No lymphadenopathy or thryomegaly appreciated. Cor: Sternal wound ok. Regular rate & rhythm.  Lungs: decreased BS throughout Abdomen: soft, nontender, nondistended. No hepatosplenomegaly. No bruits or masses. Good bowel sounds. Extremities: no cyanosis, clubbing, rash. Chronic venous stasis changes with 1+ edema. RUE PICC Neuro: alert & orientedx3, cranial nerves grossly intact. moves all 4 extremities w/o difficulty. Affect pleasant     Labs: Basic Metabolic Panel:  Recent Labs Lab 11/07/14 0520 11/08/14 0455 11/09/14 0500 11/10/14 0542 11/11/14 0600  NA 133* 134* 134* 135 134*  K 5.0 4.9 4.8 4.7 4.3  CL 93* 95* 95* 98* 96*  CO2 33* 31 31 31 29   GLUCOSE 111* 113* 95 88 122*  BUN 32* 35* 31* 33* 32*  CREATININE 1.16 1.27* 1.04 1.04 1.07  CALCIUM 8.8* 8.8* 9.0 9.2 8.9    Liver Function Tests:  Recent Labs Lab 11/05/14 0558  AST 19  ALT 23  ALKPHOS 93  BILITOT 0.5  PROT 5.4*  ALBUMIN 2.2*    No results for input(s): LIPASE, AMYLASE in the last 168 hours. No results for input(s): AMMONIA in the last 168 hours.  CBC:  Recent Labs Lab 11/05/14 0558 11/06/14 0535  WBC 11.5* 12.1*  NEUTROABS 7.8*  --   HGB 10.7* 10.5*  HCT 33.6* 33.2*  MCV 109.1* 109.2*  PLT 451* 430*    Cardiac Enzymes: No results for input(s): CKTOTAL, CKMB, CKMBINDEX, TROPONINI in the last 168 hours.  BNP: BNP (last 3 results)  Recent Labs  10/12/14 1337 10/29/14 0812 11/03/14 0358  BNP 981.7* 1031.0* 699.7*    ProBNP (last 3 results) No results for input(s): PROBNP in the last 8760 hours.    Other results:  Imaging: No results found.   Medications:     Scheduled Medications: . arformoterol  15 mcg Nebulization BID  . aspirin EC  325 mg Oral Daily   Or  . aspirin  324 mg Per Tube Daily  . atorvastatin  80 mg Oral q1800  . budesonide (PULMICORT) nebulizer solution  0.5 mg Nebulization BID  . carvedilol  9.375 mg Oral BID WC  . digoxin  0.125 mg Oral Daily  . dutasteride  0.5 mg Oral Daily  . enoxaparin (LOVENOX) injection  40 mg Subcutaneous Q24H  . feeding supplement (PRO-STAT SUGAR FREE 64)  30 mL Oral BID  .  furosemide  40 mg Oral Daily  . hydrocerin   Topical BID  . insulin aspart  0-9 Units Subcutaneous TID WC  . mirtazapine  45 mg Oral QHS  . nicotine  21 mg Transdermal Daily  . pantoprazole  40 mg Oral Daily  . sacubitril-valsartan  1 tablet Oral BID  . senna-docusate  2 tablet Oral QHS  . sodium chloride  10-40 mL Intracatheter Q12H  . spironolactone  12.5 mg Oral Daily    Infusions:    PRN Medications: acetaminophen, ALPRAZolam, alum & mag hydroxide-simeth, benzonatate, bisacodyl, guaiFENesin-dextromethorphan, levalbuterol, lidocaine, methocarbamol, ondansetron **OR** ondansetron (ZOFRAN) IV, oxyCODONE, sodium chloride, traMADol, traZODone   Assessment:   1. ICM---> CABG x3 7/20 IMA-LAD;SVG to OM,SVG to Distal RCA (N/A). On statin, bb, aspirin  2.  Acute Systolic Heart Failure- ECHO 10/12/14 EF 20% ICM per [facility]  3. Physical deconditioning 4. Severe protein calorie malnutrition  5. Hoarseness  Plan/Discussion:    Making progress. HF stable. Continue current meds. I removed chest tube sutures. Place TED hose .   Length of Stay: 7   Bensimhon, Daniel  MD  11/11/2014, 7:40 PM  Advanced Heart Failure Team Pager [phone] (M-F; [place])  Please contact [facility] for night-coverage after hours (4p -7a ) and weekends on [email or web address]

## 2014-11-12 ENCOUNTER — Inpatient Hospital Stay (HOSPITAL_COMMUNITY): Payer: PRIVATE HEALTH INSURANCE | Admitting: *Deleted

## 2014-11-12 ENCOUNTER — Inpatient Hospital Stay (HOSPITAL_COMMUNITY): Payer: PRIVATE HEALTH INSURANCE

## 2014-11-12 ENCOUNTER — Inpatient Hospital Stay (HOSPITAL_COMMUNITY): Payer: PRIVATE HEALTH INSURANCE | Admitting: Speech Pathology

## 2014-11-12 ENCOUNTER — Inpatient Hospital Stay (HOSPITAL_COMMUNITY): Payer: PRIVATE HEALTH INSURANCE | Admitting: Physical Therapy

## 2014-11-12 DIAGNOSIS — I5022 Chronic systolic (congestive) heart failure: Secondary | ICD-10-CM

## 2014-11-12 LAB — URINALYSIS, ROUTINE W REFLEX MICROSCOPIC
Bilirubin Urine: NEGATIVE
GLUCOSE, UA: NEGATIVE mg/dL
KETONES UR: NEGATIVE mg/dL
Leukocytes, UA: NEGATIVE
NITRITE: NEGATIVE
PROTEIN: 30 mg/dL — AB
SPECIFIC GRAVITY, URINE: 1.01 (ref 1.005–1.030)
UROBILINOGEN UA: 0.2 mg/dL (ref 0.0–1.0)
pH: 7 (ref 5.0–8.0)

## 2014-11-12 LAB — BASIC METABOLIC PANEL
ANION GAP: 6 (ref 5–15)
BUN: 29 mg/dL — ABNORMAL HIGH (ref 6–20)
CO2: 33 mmol/L — ABNORMAL HIGH (ref 22–32)
Calcium: 9 mg/dL (ref 8.9–10.3)
Chloride: 97 mmol/L — ABNORMAL LOW (ref 101–111)
Creatinine, Ser: 1.1 mg/dL (ref 0.61–1.24)
GLUCOSE: 90 mg/dL (ref 65–99)
Potassium: 4.5 mmol/L (ref 3.5–5.1)
SODIUM: 136 mmol/L (ref 135–145)

## 2014-11-12 LAB — GLUCOSE, CAPILLARY
GLUCOSE-CAPILLARY: 152 mg/dL — AB (ref 65–99)
GLUCOSE-CAPILLARY: 141 mg/dL — AB (ref 65–99)
Glucose-Capillary: 78 mg/dL (ref 65–99)
Glucose-Capillary: 113 mg/dL — ABNORMAL HIGH (ref 65–99)

## 2014-11-12 LAB — URINE MICROSCOPIC-ADD ON

## 2014-11-12 MED ORDER — TAMSULOSIN HCL 0.4 MG PO CAPS
0.4000 mg | ORAL_CAPSULE | Freq: Every day | ORAL | Status: DC
  Administered 2014-11-12 – 2014-11-14 (×3): 0.4 mg via ORAL
  Filled 2014-11-12 (×3): qty 1

## 2014-11-12 MED ORDER — TAMSULOSIN HCL 0.4 MG PO CAPS: 0.4000 mg | ORAL_CAPSULE | Freq: Every day | ORAL | Status: DC

## 2014-11-12 MED ORDER — WHITE PETROLATUM GEL
Status: AC
  Administered 2014-11-12: 0.2
  Filled 2014-11-12: qty 1

## 2014-11-12 NOTE — Progress Notes (Signed)
Nutrition Follow-up  DOCUMENTATION CODES:   Severe malnutrition in context of chronic illness  INTERVENTION:   Continue 30 ml Prostat po BID, each dose provides 100 kcal and 15 grams of protein.  Provide nourishment snacks. (ordered)  Encourage adequate PO intake.   NUTRITION DIAGNOSIS:   Malnutrition related to chronic illness as evidenced by severe depletion of body fat, severe depletion of muscle mass; ongoing  GOAL:   Patient will meet greater than or equal to 90% of their needs; met  MONITOR:   PO intake, Supplement acceptance, Weight trends, Labs, I & O's  REASON FOR ASSESSMENT:   Consult  (snacks)  ASSESSMENT:   64 y.o. male with history of HTN, COPD, tobacco abuse who was admitted on 10/11/14 with progressive SOB due to acute on chronic respiratory failure and was intubation at admission. He was found to have acute systolic CHF requiring aggressive diuresis and 2 D echo done revealing EF 20-25% with mild MR. Cardiac cath done on 07/14 revealed severe 3V CAD with severe LV dysfunction and patient underwent CABG X # on 07/21  Meal completion has been 50-95%. Pt currently has Prostat ordered and has been consuming them. RD to continue with orders. Continue to encourage adequate PO intake.   Labs and medications reviewed.   Diet Order:  Diet Heart Room service appropriate?: Yes; Fluid consistency:: Thin  Skin:   (Incision on mid sternum and R thigh, non-pitting RLE edema)  Last BM:  8/7  Height:   Ht Readings from Last 1 Encounters:  11/04/14 5' 7"  (1.702 m)    Weight:   Wt Readings from Last 1 Encounters:  11/12/14 127 lb 10.3 oz (57.9 kg)    Ideal Body Weight:  67.3 kg  BMI:  Body mass index is 19.99 kg/(m^2).  Estimated Nutritional Needs:   Kcal:  1800-2000  Protein:  80-95 grams  Fluid:  1.8 - 1.9 L/day  EDUCATION NEEDS:   No education needs identified at this time  Corrin Parker, MS, RD, LDN Pager # [phone] After hours/ weekend  pager # [phone]

## 2014-11-12 NOTE — Progress Notes (Signed)
Occupational Therapy Session Note  Patient Details  Name: Travis Romero MRN: [id number] Date of Birth: July 11, 1950  Today's Date: 11/12/2014 OT Individual Time: 0800-0900 OT Individual Time Calculation (min): 60 min    Short Term Goals: Week 1:  OT Short Term Goal 1 (Week 1): Pt will complete UB dressing with min A to increase functional independence.  OT Short Term Goal 2 (Week 1): Pt will complete LB dressing with mod A sit<>stand to increase functional independence.  OT Short Term Goal 3 (Week 1): Pt will complete functional transfers with mod A.  OT Short Term Goal 4 (Week 1): Pt will complete functional activity in standing for 30s with mod A.   Skilled Therapeutic Interventions/Progress Updates:    Pt engaged in BADL retraining including bathing and dressing with sit<>stand from w/c at sink.  Pt declined shower stating that he did not want to get cold.  Pt recalled that we had discussed this option a few days earlier.  Pt also requested that I demonstrate use of theraputty again.  Pt required max verbal cues to adhere to sternal precautions with bed mobility.  Pt performed SPT to w/c with min A and minimal posterior lean.  Pt required max verbal cues for sternal precautions when performing sit<>stand at sink.  Pt requires more than a reasonable amount of time to initiate and complete all tasks.Focus on activity tolerance, sit<>stand, standing balance, functional transfers, task initiation, and safety awareness.  Therapy Documentation Precautions:  Precautions Precautions: Fall, Sternal Precaution Comments: watch HR  Restrictions Weight Bearing Restrictions: No (Sternal precautions) Pain: Pain Assessment Pain Assessment: 0-10 Pain Score: 4  Pain Type: Acute pain Pain Location: Generalized Pain Onset: On-going Pain Intervention(s): RN made aware;Emotional support  See FIM for current functional status  Therapy/Group: Individual Therapy  [name] 11/12/2014,  10:06 AM

## 2014-11-12 NOTE — Progress Notes (Signed)
Speech Language Pathology Weekly Progress and Session Note  Patient Details  Name: Travis Romero MRN: [id number] Date of Birth: 10-Jan-1951  Beginning of progress report period: November 04, 2014 End of progress report period: November 12, 2014  Today's Date: 11/12/2014 SLP Individual Time: 0910-1000 SLP Individual Time Calculation (min): 50 min  Short Term Goals: Week 1: SLP Short Term Goal 1 (Week 1): Patient will utilize external memory aids to recall new, daily information with Min A multimodal cues. SLP Short Term Goal 1 - Progress (Week 1): Not met SLP Short Term Goal 2 (Week 1): Patient will demonstrate functional problem for basic and familiar tasks with Min A multimodal cues.  SLP Short Term Goal 2 - Progress (Week 1): Not met SLP Short Term Goal 3 (Week 1): Patient will demonstrate sustained attention to a functional task for 60 minutes with Min A verbal cues for redirection.  SLP Short Term Goal 3 - Progress (Week 1): Not met SLP Short Term Goal 4 (Week 1): Patient will self-monitor and correct errors with functional tasks with Min A verbal and question cues.  SLP Short Term Goal 4 - Progress (Week 1): Met SLP Short Term Goal 5 (Week 1): Patient will utilize an increased vocal intensity at the phrase level with Min A verbal cues to increase intelligibility to 100%.  SLP Short Term Goal 5 - Progress (Week 1): Met    New Short Term Goals: Week 2: SLP Short Term Goal 1 (Week 2): Patient will utilize external memory aids to recall new, daily information with Min A multimodal cues. SLP Short Term Goal 2 (Week 2): Patient will demonstrate sustained attention to a functional task for 60 minutes with Min verbal cues for redirection.  SLP Short Term Goal 3 (Week 2): Patient will self-monitor and correct errors with functional tasks with Min verbal and question cues.  SLP Short Term Goal 4 (Week 2): Patient will utilize an increased vocal intensity at the phrase level with supervision verbal  cues to increase intelligibility to 100%.  SLP Short Term Goal 5 (Week 2): Patient will demonstrate functional problem for basic and familiar tasks with Min A multimodal cues.   Weekly Progress Updates: Patient has made minimal progress and has met 1 of 5 STG's this reporting period. Currently, patient requires overall Mod A multimodal cues for recall with use of external aids, functional problem solving, organization, awareness and safety. Patient also requires Min A multimodal cues for sustained attention to tasks and for use of an increased vocal intensity to increase intelligibility to 100% at the sentence level.  Patient education is ongoing. Patient would benefit from continued skilled SLP intervention to maximize cognitive function in order to maximize his overall functional independence prior to discharge.    Intensity: Minumum of 1-2 x/day, 30 to 90 minutes Frequency: 3 to 5 out of 7 days Duration/Length of Stay: 11/26/14 Treatment/Interventions: Cognitive remediation/compensation;Cueing hierarchy;Functional tasks;Environmental controls;Internal/external aids;Patient/family education;Therapeutic Activities;Speech/Language facilitation   Daily Session  Skilled Therapeutic Interventions: Skilled treatment session focused on cognitive goals. Upon arrival, patient was receiving nursing care, therefore, patient missed initial 10 minutes of session. SLP facilitated session by providing Mod A question and verbal cues for utilization of memory compensatory strategies for recall of events from previous therapy sessions and current medications. Patient also recalled strategies for vocal hygiene with Min A question cues. Overall, patient appeared more alert with an increased vocal intensity and increased ability to participate in treatment session. Patient left upright in wheelchair with quick release belt  in place and all needs within reach. Continue with current plan of care.       FIM:   Comprehension Comprehension Mode: Auditory Comprehension: 5-Follows basic conversation/direction: With no assist Expression Expression Mode: Verbal Expression: 4-Expresses basic 75 - 89% of the time/requires cueing 10 - 24% of the time. Needs helper to occlude trach/needs to repeat words. Social Interaction Social Interaction: 4-Interacts appropriately 75 - 89% of the time - Needs redirection for appropriate language or to initiate interaction. Problem Solving Problem Solving: 4-Solves basic 75 - 89% of the time/requires cueing 10 - 24% of the time Memory Memory: 4-Recognizes or recalls 75 - 89% of the time/requires cueing 10 - 24% of the time FIM - Eating Eating Activity: 5: Set-up assist for open containers Pain No/Denies Pain   Therapy/Group: Individual Therapy  Chayim Bialas 11/12/2014, 4:26 PM

## 2014-11-12 NOTE — Plan of Care (Signed)
Problem: RH PAIN MANAGEMENT Goal: RH STG PAIN MANAGED AT OR BELOW PT'S PAIN GOAL <5  Outcome: Not Progressing Reports pain as 7-8 at all times

## 2014-11-12 NOTE — Plan of Care (Signed)
Problem: RH BLADDER ELIMINATION Goal: RH STG MANAGE BLADDER WITH EQUIPMENT WITH ASSISTANCE STG Manage Bladder With Equipment With Assistance. Min A  Outcome: Not Progressing Requiring I&O cath

## 2014-11-12 NOTE — Progress Notes (Signed)
Physical Therapy Weekly Progress Note  Patient Details  Name: Travis Romero MRN: [id number] Date of Birth: Oct 24, 1950  Beginning of progress report period: November 05, 2014 End of progress report period: November 13, 2014  Today's Date: 11/12/2014 PT Individual Time: 6283-6629 PT Individual Time Calculation (min): 90 min   Patient has met 5 of 5 short term goals.  Barriers to PT are activity tolerance and cognition. Pt progress has been inconsistent at times but still making slow significant gains. Pt currently supervision to min A overall with AD and mod A overall without AD for functional mobility. Family (sister) has been updated on pt's CLOF and D/C recommendations.   Patient continues to demonstrate the following deficits: activity tolerance, balance, motor control, and cognition and therefore will continue to benefit from skilled PT intervention to enhance overall performance with activity tolerance, balance, postural control, ability to compensate for deficits, attention, awareness, coordination and knowledge of precautions.  Patient progressing toward long term goals..  Continue plan of care.  PT Short Term Goals Week 1:  PT Short Term Goal 1 (Week 1): pt will perform standing activity for 5 min with min assist PT Short Term Goal 1 - Progress (Week 1): Met PT Short Term Goal 2 (Week 1): pt will ambulate with no AD 25 feet with min assist PT Short Term Goal 2 - Progress (Week 1): Met PT Short Term Goal 3 (Week 1): pt will perform bed mobillity with supervision PT Short Term Goal 3 - Progress (Week 1): Met PT Short Term Goal 4 (Week 1): pt will negotiate two six inch steps using fingertip support on rails with min assist PT Short Term Goal 4 - Progress (Week 1): Met PT Short Term Goal 5 (Week 1): pt will perfrom stand pivot transfer with min assist PT Short Term Goal 5 - Progress (Week 1): Met Week 2:  PT Short Term Goal 1 (Week 2): pt will perform standing activity for 10 min with  min assist for balance PT Short Term Goal 2 (Week 2): pt will ambulate least restricitive AD X 175 ft with min assist PT Short Term Goal 3 (Week 2): pt will perform stand pivot transfer with no AD with supervision PT Short Term Goal 4 (Week 2): pt will negotiate 14 six inch steps with supervision and fingertip support bilat UE  Skilled Therapeutic Interventions/Progress Updates:  Session focused on NMR, functional mobility, and activity tolerance. Pt was received eating lunch, PT performed LE stretching for bilat hamstrings and heelcords while pt finished eating. Pt required mod A with initial stand pivot transfer bed to chair with RW. Pt performed sit to stand  X 3 while reaching and lifting beach ball to facilitate anterior weight shift. Pt performed remaining transfers X 6 with RW requiring supervision for cuing to "reach for ball, slide to edge of seat, and feet placement." Pt performed dynamic balance activity of standing with feet on wedge to facilitate anterior weight shift and pt progressed from static standing 1 min to reaches in multiple directions. Pt negotiated up/down 12 stairs requiring overall supervision with one time min A for steady upon last descending stair. Pt performed bed mobility in simulated ADL apartment requiring supervision and cuing for sequence. Pt ambulated with RW X 100 feet requiring supervision to min A. Pt progressed to ambulating without AD 40 feet X 2 with mod A for balance while demonstrating short wide steps with hips externally rotated and decreased arm swing, pt cued to take longer narrow strides with  reciprocal arm swing. Pt left sitting in wheelchair with quick release belt and all needs in reach. Safety plan was updated for RW for transfers in room with min A.  Therapy Documentation Precautions:  Precautions Precautions: Fall, Sternal Precaution Comments: watch HR  Restrictions Weight Bearing Restrictions: No (Sternal precautions)  Vital Signs: Pulse Rate:  (!) 113 (after activity) SpO2: 93 % (after activity) Room air  Pain: Pain Assessment- faces Pain Score: hurts a little Location: general Pain Intervention(s): rest, repositioned.  See FIM for current functional status  Therapy/Group: Individual Therapy  Elsie Ra 11/12/2014, 7:52 AM

## 2014-11-12 NOTE — Consult Note (Signed)
NEUROCOGNITIVE TESTING - [place]   Travis Romero is a 64 year old man, who was seen for a brief neuropsychological assessment to evaluate his cognitive and emotional functioning in the setting of encephalopathy with recent hypoxia.  According to his medical record, he was admitted on 10/11/14 with progressive shortness of breath due to acute and chronic respiratory failure requiring intubation at admission.  He ultimately underwent CABG on 10/24/14, though had ongoing hypoxia post-operatively.  He underwent right thoracocentesis on 10/30/14 with improvement in respiratory status and is continuing to receive treatment for heart failure.  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 Subjectively, Travis Romero acknowledged noticing increased difficulty remembering new information as well as slowed processing speed.  Emotionally, he recognized irritation that he is in this situation, medically, but denied symptoms of major depression or anxiety.    PROCEDURES: [3 units of 74944 on 11/11/2014]  The following tests were performed during today's visit: Mini Mental Status Examination (red form - brief version), Repeatable Battery for the Assessment of Neuropsychological Status (RBANS, form A), Geriatric Depression Scale (short form), and the Geriatric Anxiety Inventory.  Performance Validity indicators were also assessed.  Test results are as follows:   MMSE - brief Raw Score = 14 Description = WNL   RBANS Indices Scaled Score Percentile Description  Immediate Memory  90 25 Average  Visuospatial/Constructional 105 63 Average  Language 98 45 Average  Attention 72 3 Impaired  Delayed Memory 89 23 Below Average  Total Score 86 18 Below Average   RBANS Subtests Raw Score Percentile Description  List Learning 25 25 Average  Story Memory 16 25 Average  Figure Copy 17 25 Average  Line  Orientation 20 87 Above Average  Picture Naming 10 73 Average  Semantic Fluency 20 39 Average  Digit Span 9 27 Average  Coding 19 < 1 Profoundly Impaired  List Recall 2 3 Impaired  List Recognition 19 37 Average  Story Recall 7 14 Below Average  Figure recall 6 3 Impaired   GDS Raw Score = 2 Description = WNL  GAI Raw Score = 0 Description = WNL   Prior to initiating the current evaluation, Travis Romero questioned the medical necessity of it, but after the purpose of testing was explained, he was agreeable.  Behaviorally during the evaluation, he seemed to want to rush through the evaluation, often interrupting instructions to say, "Okay, okay" as if he already understood task demands.  He also questioned "how many more" trials a subtest of list-learning would have, as he was clearly ready to move on.  He expressed feeling fatigued during the evaluation and on the subtest of delayed memory for information studied by rote, he recalled two items and then exclaimed, "I'm taxed."  When asked to perform drawing tasks, he often had visibly labored breathing.  Of note, Travis Romero was not able to sit up well to complete the evaluation, so subtests that required drawing should be interpreted with caution, as scores may have been adversely impacted by difficulty seeing stimuli and reaching the table.    Travis Romero appeared to be an accurate historian.  An embedded measure of performance validity was within normal limits, suggesting that he had optimal task engagement throughout the brief assessment today.  His test results revealed mostly intact scores, with the exception of performances that were below expectation on multiple subtests of delayed memory as well as impairment on  a subtest requiring complex attention and psychomotor skills, though as stated above, that performance was likely adversely impacted by physical positioning.  Still, he could be having some slight reduction in processing speed.     IMPRESSION AND RECOMMENDATIONS:  Overall, Travis Romero current test results indicate that he might be having trouble with recalling learned information at the level of a Mild Neurocognitive Disorder, likely secondary to potential hypoxia and/or encephalopathy.  From an emotional standpoint, there was no evidence to suggest the presence of a clinically significant mood disorder at this time.  Given the mild nature of his cognitive disruption, he may be able to return to work once he is physically recovered, particularly if he implements some compensatory strategies.  His recognition memory was intact and therefore, if he is provided with a hint or cue, then he is likely going to recall most of what he studied.  His memory was also better when information was presented within a context.  Therefore, those interacting with him may find that if they frame important details within a story, then he will recall those details better.  Writing notes (a strategy he said he has already begun implementing) may also assist in memory in his case.    The expected course in cases of cognitive difficulty secondary to hypoxia/encephalopathy was discussed with Travis Romero.  He seemed to feel encouraged that there is potential for cognitive improvement moving forward.  It was recommended that he seek a comprehensive neuropsychological evaluation as an outpatient should his cognitive functioning fail to return to baseline.  If significant cognitive improvement is seen during his inpatient stay, a follow-up neuropsychological screen could also be conducted prior to discharge to document improvement and further assist with providing recommendations regarding timeline to return to work.    Marlane Hatcher, Psy.D.  Clinical Neuropsychologist

## 2014-11-12 NOTE — Patient Care Conference (Signed)
Inpatient RehabilitationTeam Conference and Plan of Care Update Date: 11/12/2014   Time: 2:50 PM    Patient Name: Travis Romero      Medical Record Number: [id number]  Date of Birth: [birth year] Sex: Male         Room/Bed: 4W18C/4W18C-01 Payor Info: Payor: MEDCOST / Plan: MEDCOST / Product Type: *No Product type* /    Admitting Diagnosis: Debility after resp failure  CHF and CABG  Admit Date/Time:  11/04/2014  2:44 PM Admission Comments: No comment available   Primary Diagnosis:  Physical debility Principal Problem: Physical debility  Patient Active Problem List   Diagnosis Date Noted  . Chronic systolic heart failure   . Physical debility 11/04/2014  . 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 Urinary retention   . Pleural effusion   . BPH (benign prostatic hypertrophy) with urinary retention   . Coronary artery disease involving native coronary artery of native heart without angina pectoris   . SOB (shortness of breath)   . Tobacco abuse   . HLD (hyperlipidemia)   . Severe peripheral arterial disease 10/18/2014  . NSTEMI (non-ST elevated myocardial infarction)   . Acute on chronic combined systolic and diastolic CHF (congestive heart failure)   . Shortness of breath   . Adjustment disorder with anxious mood   . Acute respiratory failure with hypoxia 10/13/2014  . Acute combined systolic and diastolic CHF, NYHA class 4 10/13/2014  . Acute pulmonary edema   . Endotracheal tube present   . CHF exacerbation 10/12/2014  . Benign essential HTN 10/07/2014  . BPH (benign prostatic hyperplasia) 10/07/2014  . COPD (chronic obstructive pulmonary disease) 10/07/2014  . Smoker 10/07/2014    Expected Discharge Date: Expected Discharge Date: 11/26/14  Team Members Present: Physician leading conference: Dr. Alysia Penna Social Worker Present: Lennart Pall, LCSW Nurse Present: Heather Roberts, RN PT Present: Carney Living, PT OT Present: Roanna Epley, [place], OT SLP Present: Weston Anna, SLP PPS Coordinator present : Daiva Nakayama, RN, CRRN     Current Status/Progress Goal Weekly Team Focus  Medical   min A mobility, has urinary retention  Mod I  bladder and bowel continence   Bowel/Bladder   Continent of bowel. LBM 11/09/14. Foley to be removed. Initiating trial voiding  Managed bowel and bladder  Managed bowel and bladder    Swallow/Nutrition/ Hydration             ADL's   STS-min A/mod A; bathing-mod A; UB dressing-min A; LB dressing-tot A; transfers-min A/mod A; decreased activity tolerance  min A/supervision overall  sit<>stand; activity tolerance, BADL retraining, functinal transfers, standing balance, safety awareness   Mobility   min A for transfers no AD, ambulation without AD X 50 feet, min A  to supervision for ambulation with RW and transfers with RW.  Supervision for bed mobility.  min A to supervision  activity tolerance, D/C planning, gait training, functional mobility, dynamic balance, NMR   Communication   Supervision   Mod I   increased vocal intensity and vocal hygeine   Safety/Cognition/ Behavioral Observations  Mod A  Supervision  recall, problem solving, safety, awareness    Pain   Tramadol 50mg  q 4hrs prn   <8  Monitor for effectiveness   Skin   CABG incisions CDI  No additional skin breakdown  Assess q shift. Continue to encourage turns q 2hrs    Rehab Goals Patient on target to meet rehab goals: Yes *See Care Plan and progress notes for long and  short-term goals.  Barriers to Discharge: see above    Possible Resolutions to Barriers:  work on task initiation    Discharge Planning/Teaching Needs:  if significant gains made then may be able to consider d/c home, however, SNF is more likely to be the plan      Team Discussion:  Making gains and more alert today.  Hope this will bontinue.  Improved O2 sats and may be able to wean off O2.  Functional improvement but still with cognitive impairment and memory deficits.  Concern that he  still needs 24/7 care at d/c and no plan for this.  May need to consider SNF.  Revisions to Treatment Plan:  None   Continued Need for Acute Rehabilitation Level of Care: The patient requires daily medical management by a physician with specialized training in physical medicine and rehabilitation for the following conditions: Daily direction of a multidisciplinary physical rehabilitation program to ensure safe treatment while eliciting the highest outcome that is of practical value to the patient.: Yes Daily analysis of laboratory values and/or radiology reports with any subsequent need for medication adjustment of medical intervention for : Neurological problems;Other;Cardiac problems  Marvella Jenning 11/12/2014, 9:52 PM

## 2014-11-12 NOTE — Progress Notes (Signed)
Occupational Therapy Weekly Progress Note  Patient Details  Name: Travis Romero MRN: [id number] Date of Birth: 09-Apr-1950  Beginning of progress report period: November 04, 2014 End of progress report period: November 12, 2014  Patient has met 3 of 4 short term goals.  Pt progress has been slow this week with patient continuing to require more than a reasonable amount of time to initiate and complete all BADLs.  Pt continues to require max verbal cues to adhere to sternal precautions.  Pt requires min A for UB dressing, mod A for bathing, and tot A for LB dressing.  Pt continues to exhibit a posterior lean when standing and during functional transfers but is able to correct with max multimodal cues. Pt continues to exhibit left hand weakness, primarily with wrist extension and 4th and 5th digit extension.  Pt has been issued a HEP for theraputty.  Patient continues to demonstrate the following deficits: impaired memory, attention, awareness, impaired activity tolerance, impaired standing balance, and impaired LUE strength and therefore will continue to benefit from skilled OT intervention to enhance overall performance with BADL.  Patient progressing toward long term goals..  Continue plan of care.  OT Short Term Goals Week 1:  OT Short Term Goal 1 (Week 1): Pt will complete UB dressing with min A to increase functional independence.  OT Short Term Goal 1 - Progress (Week 1): Met OT Short Term Goal 2 (Week 1): Pt will complete LB dressing with mod A sit<>stand to increase functional independence.  OT Short Term Goal 2 - Progress (Week 1): Progressing toward goal OT Short Term Goal 3 (Week 1): Pt will complete functional transfers with mod A.  OT Short Term Goal 3 - Progress (Week 1): Met OT Short Term Goal 4 (Week 1): Pt will complete functional activity in standing for 30s with mod A.  OT Short Term Goal 4 - Progress (Week 1): Met Week 2:  OT Short Term Goal 1 (Week 2): Pt will complete LB  dressing with mod A sit<>stand to increase functional independence.  OT Short Term Goal 2 (Week 2): Pt will complete UB dressing with supervision OT Short Term Goal 3 (Week 2): Pt will complete bathing tasks with min A OT Short Term Goal 4 (Week 2): Pt will perform toilet transfers with mod A   Therapy Documentation Precautions:  Precautions Precautions: Fall, Sternal Precaution Comments: watch HR  Restrictions Weight Bearing Restrictions: No (Sternal precautions)  Vital Signs: Therapy Vitals Pulse Rate: 91 Oxygen Therapy SpO2: 98 % O2 Device: Nasal Cannula O2 Flow Rate (L/min): 0.5 L/min   Pain: Pain Assessment Pain Score: 4  Pain Type: Acute pain Pain Location: Generalized Pain Onset: On-going Pain Intervention(s): RN made aware;Emotional support  [name] 11/12/2014, 1:35 PM

## 2014-11-12 NOTE — Plan of Care (Signed)
Problem: RH BLADDER ELIMINATION Goal: RH STG MANAGE BLADDER WITH EQUIPMENT WITH ASSISTANCE STG Manage Bladder With Equipment With Assistance. Min A  Outcome: Not Progressing I/o cath q6hrs  Problem: RH PAIN MANAGEMENT Goal: RH STG PAIN MANAGED AT OR BELOW PT'S PAIN GOAL <5  Outcome: Not Progressing Rates pain 6/10

## 2014-11-12 NOTE — Progress Notes (Signed)
[place] PHYSICAL MEDICINE & REHABILITATION     PROGRESS NOTE    Subjective/Complaints: No issues overnite Foley out but requiring ICP Weaning O2, ok mild SOB, no cough , no CP  ROS:  nausea, vomiting, abdominal pain, diarrhea, chest pain,  palpitations,    Objective: Vital Signs: Blood pressure 149/87, pulse 96, temperature 98 F (36.7 C), temperature source Oral, resp. rate 18, height 5\' 7"  (1.702 m), weight 57.9 kg (127 lb 10.3 oz), SpO2 98 %. No results found. No results for input(s): WBC, HGB, HCT, PLT in the last 72 hours.  Recent Labs  11/11/14 0600 11/12/14 0550  NA 134* 136  K 4.3 4.5  CL 96* 97*  GLUCOSE 122* 90  BUN 32* 29*  CREATININE 1.07 1.10  CALCIUM 8.9 9.0   CBG (last 3)   Recent Labs  11/11/14 1641 11/11/14 2130 11/12/14 0654  GLUCAP 130* 125* 78    Wt Readings from Last 3 Encounters:  11/12/14 57.9 kg (127 lb 10.3 oz)  11/04/14 56.2 kg (123 lb 14.4 oz)  10/07/14 68.493 kg (151 lb)    Physical Exam:  Constitutional: He is oriented to person, place, and time. He appears well-developed and well-nourished.  Frail appearing male  HENT: oral mucosa a little dry Head: Normocephalic and atraumatic.  Eyes: Conjunctivae are normal. Pupils are equal, round, and reactive to light.  Neck: Normal range of motion. Neck supple.  Cardiovascular: Normal rate and regular rhythm. no murmurs, rubs, gallops Respiratory: Effort normal. Fair air movement. ?decrease in RUL. He has no wheezes. No distress GI: Soft. Bowel sounds are normal. He exhibits no distension. There is no tenderness.  Musculoskeletal: He exhibits trace tibial edema.Marland Kitchen He exhibits no tenderness.  Neurological: He is alert and oriented to person, place, and time. Reasonable insight and awareness.  Dysphonic speech  improving. Able to follow one and two step commands without difficulty.  UE: 4- deltoid, 4 bicep,right  tricep, wrist, hi LUE: tricep 3+ to 4-, wrist 3+, HI 3, 4th/5th  finger drop. LE: 3 hf, 3+ ke and 4 adf/apf  Skin: Skin is warm and dry.  Dry skin bilateral shins. RLE incisions healing well--clean,dry and intact. Small scabbed abrasion below left knee laterally.chest wall incision intact  Psychiatric: Thought content generally normal. Pleasant mood.   Assessment/Plan: 1. Functional deficits secondary to debility after CABG and multiple medical  which require 3+ hours per day of interdisciplinary therapy in a comprehensive inpatient rehab setting. Physiatrist is providing close team supervision and 24 hour management of active medical problems listed below. Physiatrist and rehab team continue to assess barriers to discharge/monitor patient progress toward functional and medical goals. FIM: FIM - Bathing Bathing Steps Patient Completed: Chest, Right Arm, Left Arm, Abdomen, Front perineal area, Right upper leg, Left upper leg Bathing: 3: Mod-Patient completes 5-7 1f 10 parts or 50-74%  FIM - Upper Body Dressing/Undressing Upper body dressing/undressing steps patient completed: Thread/unthread right sleeve of front closure shirt/dress, Thread/unthread left sleeve of front closure shirt/dress, Pull shirt around back of front closure shirt/dress Upper body dressing/undressing: 4: Min-Patient completed 75 plus % of tasks FIM - Lower Body Dressing/Undressing Lower body dressing/undressing: 1: Total-Patient completed less than 25% of tasks  FIM - Toileting Toileting: 0: Activity did not occur  FIM - Air cabin crew Transfers: 2-To toilet/BSC: Max A (lift and lower assist), 2-From toilet/BSC: Max A (lift and lower assist)  FIM - Control and instrumentation engineer Devices: Adult nurse Transfer: 3: Sit > Supine: Mod A (  lifting assist/Pt. 50-74%/lift 2 legs), 4: Chair or W/C > Bed: Min A (steadying Pt. > 75%)  FIM - Locomotion: Wheelchair Distance: 100 Locomotion: Wheelchair: 2: Travels 50 - 149 ft with minimal assistance  (Pt.>75%) FIM - Locomotion: Ambulation Locomotion: Ambulation Assistive Devices: Administrator Ambulation/Gait Assistance: 4: Min assist Locomotion: Ambulation: 2: Travels 50 - 149 ft with minimal assistance (Pt.>75%)  Comprehension Comprehension Mode: Auditory Comprehension: 5-Follows basic conversation/direction: With extra time/assistive device  Expression Expression Mode: Verbal Expression: 4-Expresses basic 75 - 89% of the time/requires cueing 10 - 24% of the time. Needs helper to occlude trach/needs to repeat words.  Social Interaction Social Interaction: 5-Interacts appropriately 90% of the time - Needs monitoring or encouragement for participation or interaction.  Problem Solving Problem Solving: 4-Solves basic 75 - 89% of the time/requires cueing 10 - 24% of the time  Memory Memory: 4-Recognizes or recalls 75 - 89% of the time/requires cueing 10 - 24% of the time  Medical Problem List and Plan: 1. Functional deficits secondary to debility after acute respiratory failure and CHF exacerbation s/p CABG.  -  -weaning O2 to keep sats >90% 2. DVT Prophylaxis/Anticoagulation: Pharmaceutical: Lovenox due to VTE risk is required 3. Pain Management: oxycodone prn---observe activity tolerance with therapy 4. Mood: Increase Remeron to home dose to help manage insomnia and mood. Team to provide ego support. LCSW to follow for evaluation and support.  5. Neuropsych: This patient is capable of making decisions on his own behalf. 6. Skin/Wound Care: Routine pressure relief measures. Maintain adequate nutritional intake. Offer supplements between meals.  7. Fluids/Electrolytes/Nutrition: Monitor I/O. Daily labs to monitor metabolic/electrolyte status, 668ml fluid on 8/8 8. CAD s/p CABG: Continue sternal precautions. Continues to have incisional pain but no CP. On ASA, lipitor, coreg and Entresto.  9. Acute on systolic CHF: Monitor daily weights. Low salt diet.   aldactone, coreg,  Entestor and digoxin.  -lasix 40mg   - Cardiology following also. Appreciate Dr B note  -all labs personally reviewed again today---BUN and creat normalizing  10 COPD: Avoiding Spiriva with BPH/voiding difficulties. Continue Brovan/pulmicort with prn Xopenex. To be discharged on symbicort 80/4.5 2 puffs bid + prn xopenex  -outpt follow up with pulmonary at dc  -lungs remain generally clear but with reduced air flow. Continue to work on activity tolerance  -continue oxygen as needed. Consider tapering oxygen next week if possible 11. ABLA: Slowly improving. Labs reviewed. Steady today 12. HTN: Soft blood pressures but asymptomatic at present. Observe for activity tolerance.  13. Urinary retention/BPH: continue Avodart.  ICP with high volumes, add flomax monitor BP tolerance  -ua unremarkable , ucx negative  14. Adjustment disorder: Mentation improving gradually.Continue Xanax for chronic anxiety/ adjustment reaction.  15. Reactive leucocytosis: Likely due to steroids/surgery. No signs of infection on exam. Continue to monitor 16. Stress induced hyperglycemia: Hgb A1C- 5.9. Improved    LOS (Days) 8 A FACE TO FACE EVALUATION WAS PERFORMED  Travis Romero E 11/12/2014 9:00 AM

## 2014-11-13 ENCOUNTER — Inpatient Hospital Stay (HOSPITAL_COMMUNITY): Payer: PRIVATE HEALTH INSURANCE | Admitting: Physical Therapy

## 2014-11-13 ENCOUNTER — Inpatient Hospital Stay (HOSPITAL_COMMUNITY): Payer: PRIVATE HEALTH INSURANCE | Admitting: Speech Pathology

## 2014-11-13 ENCOUNTER — Inpatient Hospital Stay (HOSPITAL_COMMUNITY): Payer: PRIVATE HEALTH INSURANCE

## 2014-11-13 DIAGNOSIS — R339 Retention of urine, unspecified: Secondary | ICD-10-CM

## 2014-11-13 LAB — GLUCOSE, CAPILLARY
GLUCOSE-CAPILLARY: 135 mg/dL — AB (ref 65–99)
Glucose-Capillary: 135 mg/dL — ABNORMAL HIGH (ref 65–99)
Glucose-Capillary: 107 mg/dL — ABNORMAL HIGH (ref 65–99)
Glucose-Capillary: 111 mg/dL — ABNORMAL HIGH (ref 65–99)

## 2014-11-13 LAB — URINE CULTURE: CULTURE: NO GROWTH

## 2014-11-13 LAB — BASIC METABOLIC PANEL
Anion gap: 9 (ref 5–15)
BUN: 28 mg/dL — ABNORMAL HIGH (ref 6–20)
CO2: 31 mmol/L (ref 22–32)
Calcium: 9.3 mg/dL (ref 8.9–10.3)
Chloride: 97 mmol/L — ABNORMAL LOW (ref 101–111)
Creatinine, Ser: 1.16 mg/dL (ref 0.61–1.24)
Glucose, Bld: 100 mg/dL — ABNORMAL HIGH (ref 65–99)
POTASSIUM: 4.4 mmol/L (ref 3.5–5.1)
SODIUM: 137 mmol/L (ref 135–145)

## 2014-11-13 MED ORDER — SACUBITRIL-VALSARTAN 97-103 MG PO TABS
1.0000 | ORAL_TABLET | Freq: Two times a day (BID) | ORAL | Status: DC
  Administered 2014-11-13 – 2014-11-26 (×26): 1 via ORAL
  Filled 2014-11-13 (×33): qty 1

## 2014-11-13 NOTE — Progress Notes (Signed)
[place] PHYSICAL MEDICINE & REHABILITATION     PROGRESS NOTE    Subjective/Complaints:   ROS:  nausea, vomiting, abdominal pain, diarrhea, chest pain,  palpitations,    Objective: Vital Signs: Blood pressure 161/82, pulse 104, temperature 97.9 F (36.6 C), temperature source Oral, resp. rate 18, height 5\' 7"  (1.702 m), weight 57.38 kg (126 lb 8 oz), SpO2 95 %. No results found. No results for input(s): WBC, HGB, HCT, PLT in the last 72 hours.  Recent Labs  11/12/14 0550 11/13/14 0550  NA 136 137  K 4.5 4.4  CL 97* 97*  GLUCOSE 90 100*  BUN 29* 28*  CREATININE 1.10 1.16  CALCIUM 9.0 9.3   CBG (last 3)   Recent Labs  11/12/14 1631 11/12/14 2105 11/13/14 0644  GLUCAP 113* 141* 135*    Wt Readings from Last 3 Encounters:  11/13/14 57.38 kg (126 lb 8 oz)  11/04/14 56.2 kg (123 lb 14.4 oz)  10/07/14 68.493 kg (151 lb)    Physical Exam:  Constitutional: He is oriented to person, place, and time. He appears well-developed and well-nourished.  Frail appearing male  HENT: oral mucosa a little dry Head: Normocephalic and atraumatic.  Eyes: Conjunctivae are normal. Pupils are equal, round, and reactive to light.  Neck: Normal range of motion. Neck supple.  Cardiovascular: Normal rate and regular rhythm. no murmurs, rubs, gallops Respiratory: Effort normal. Fair air movement. ?decrease in RUL. He has no wheezes. No distress GI: Soft. Bowel sounds are normal. He exhibits no distension. There is no tenderness.  Musculoskeletal: He exhibits trace tibial edema.Marland Kitchen He exhibits no tenderness.  Neurological: He is alert and oriented to person, place, and time. Reasonable insight and awareness.  Dysphonic speech  improving. Able to follow one and two step commands without difficulty.  UE: 4- deltoid, 4 bicep,right  tricep, wrist, hi LUE: tricep 3+ to 4-, wrist 3+, HI 3, 4th/5th finger drop. LE: 3 hf, 3+ ke and 4 adf/apf  Skin: Skin is warm and dry.  Dry skin  bilateral shins. RLE incisions healing well--clean,dry and intact. Small scabbed abrasion below left knee laterally.chest wall incision intact  Psychiatric: Thought content generally normal. Pleasant mood.   Assessment/Plan: 1. Functional deficits secondary to debility after CABG and multiple medical  which require 3+ hours per day of interdisciplinary therapy in a comprehensive inpatient rehab setting. Physiatrist is providing close team supervision and 24 hour management of active medical problems listed below. Physiatrist and rehab team continue to assess barriers to discharge/monitor patient progress toward functional and medical goals. FIM: FIM - Bathing Bathing Steps Patient Completed: Chest, Right Arm, Left Arm, Abdomen, Front perineal area, Right upper leg, Left upper leg Bathing: 3: Mod-Patient completes 5-7 30f 10 parts or 50-74%  FIM - Upper Body Dressing/Undressing Upper body dressing/undressing steps patient completed: Thread/unthread right sleeve of front closure shirt/dress, Thread/unthread left sleeve of front closure shirt/dress, Pull shirt around back of front closure shirt/dress Upper body dressing/undressing: 4: Min-Patient completed 75 plus % of tasks FIM - Lower Body Dressing/Undressing Lower body dressing/undressing: 0: Activity did not occur  FIM - Toileting Toileting: 0: Activity did not occur  FIM - Air cabin crew Transfers: 2-To toilet/BSC: Max A (lift and lower assist), 2-From toilet/BSC: Max A (lift and lower assist)  FIM - Control and instrumentation engineer Devices: Copy: 5: Supine > Sit: Supervision (verbal cues/safety issues), 5: Sit > Supine: Supervision (verbal cues/safety issues), 4: Bed > Chair or W/C: Min A (steadying Pt. >  75%), 4: Chair or W/C > Bed: Min A (steadying Pt. > 75%)  FIM - Locomotion: Wheelchair Distance: 100 Locomotion: Wheelchair: 0: Activity did not occur FIM - Locomotion:  Ambulation Locomotion: Ambulation Assistive Devices: Administrator Ambulation/Gait Assistance: 4: Min guard Locomotion: Ambulation: 2: Travels 50 - 149 ft with minimal assistance (Pt.>75%)  Comprehension Comprehension Mode: Auditory Comprehension: 5-Follows basic conversation/direction: With no assist  Expression Expression Mode: Verbal Expression: 4-Expresses basic 75 - 89% of the time/requires cueing 10 - 24% of the time. Needs helper to occlude trach/needs to repeat words.  Social Interaction Social Interaction: 4-Interacts appropriately 75 - 89% of the time - Needs redirection for appropriate language or to initiate interaction.  Problem Solving Problem Solving: 4-Solves basic 75 - 89% of the time/requires cueing 10 - 24% of the time  Memory Memory: 4-Recognizes or recalls 75 - 89% of the time/requires cueing 10 - 24% of the time  Medical Problem List and Plan: 1. Functional deficits secondary to debility after acute respiratory failure and CHF exacerbation s/p CABG.  -  -weaning O2 to keep sats >90% 2. DVT Prophylaxis/Anticoagulation: Pharmaceutical: Lovenox due to VTE risk is required 3. Pain Management: oxycodone prn---observe activity tolerance with therapy 4. Mood: Increase Remeron to home dose to help manage insomnia and mood. Team to provide ego support. LCSW to follow for evaluation and support.  5. Neuropsych: This patient is capable of making decisions on his own behalf. 6. Skin/Wound Care: Routine pressure relief measures. Maintain adequate nutritional intake. Offer supplements between meals.  7. Fluids/Electrolytes/Nutrition: Monitor I/O. Daily labs to monitor metabolic/electrolyte status, 64ml fluid on 8/8 8. CAD s/p CABG: Continue sternal precautions. Continues to have incisional pain but no CP. On ASA, lipitor, coreg and Entresto.  9. Acute on systolic CHF: Monitor daily weights. Low salt diet.   aldactone, coreg, Entestor and digoxin.  -lasix 40mg   -  Cardiology following also.no recent med changes  -all labs personally reviewed again today---BUN and creat normalizing  10 COPD: Avoiding Spiriva with BPH/voiding difficulties. Continue Brovan/pulmicort with prn Xopenex. To be discharged on symbicort 80/4.5 2 puffs bid + prn xopenex  -outpt follow up with pulmonary at dc  -lungs remain generally clear but with reduced air flow. Continue to work on activity tolerance  -continue oxygen as needed. Pt uses for comfort at times 11. ABLA: Slowly improving. Labs reviewed. Steady today 12. HTN: Soft blood pressures but asymptomatic at present. Observe for activity tolerance.  13. Urinary retention/BPH: continue Avodart.  ICP with high volumes, add flomax monitor BP tolerance  -no spont voids, flomax x 1 nite only cont current dose 14. Adjustment disorder: Mentation improving gradually.Continue Xanax for chronic anxiety/ adjustment reaction.  15. Reactive leucocytosis: Likely due to steroids/surgery. No signs of infection on exam. Continue to monitor 16. Stress induced hyperglycemia: Hgb A1C- 5.9. Improved    LOS (Days) 9 A FACE TO FACE EVALUATION WAS PERFORMED  Jaide Hillenburg E 11/13/2014 8:30 AM

## 2014-11-13 NOTE — Progress Notes (Signed)
Speech Language Pathology Daily Session Note  Patient Details  Name: Travis Romero MRN: [id number] Date of Birth: 1951/03/31  Today's Date: 11/13/2014 SLP Individual Time: 1300-1345 SLP Individual Time Calculation (min): 45 min  Short Term Goals: Week 2: SLP Short Term Goal 1 (Week 2): Patient will utilize external memory aids to recall new, daily information with Min A multimodal cues. SLP Short Term Goal 2 (Week 2): Patient will demonstrate sustained attention to a functional task for 60 minutes with Min verbal cues for redirection.  SLP Short Term Goal 3 (Week 2): Patient will self-monitor and correct errors with functional tasks with Min verbal and question cues.  SLP Short Term Goal 4 (Week 2): Patient will utilize an increased vocal intensity at the phrase level with supervision verbal cues to increase intelligibility to 100%.  SLP Short Term Goal 5 (Week 2): Patient will demonstrate functional problem for basic and familiar tasks with Min A multimodal cues.   Skilled Therapeutic Interventions: Skilled treatment session focused on cognitive goals. SLP facilitated session by providing supervision question cues for recall of events from previous therapy sessions. Patient also utilized external memory aids to recall new, daily information and organized information appropriately with supervision verbal cues.  Patient participated in a mildly complex medication management task and required Min A verbal and question cues for problem solving with task. Patient's vocal quality appeared improved today with an increased vocal intensity and patient was 100% intelligible at the conversation level. Patient left upright in wheelchair with quick release belt in place and all needs within reach. Continue with current plan of care.    FIM:  Comprehension Comprehension Mode: Auditory Comprehension: 5-Follows basic conversation/direction: With no assist Expression Expression Mode: Verbal Expression:  4-Expresses basic 75 - 89% of the time/requires cueing 10 - 24% of the time. Needs helper to occlude trach/needs to repeat words. Social Interaction Social Interaction: 4-Interacts appropriately 75 - 89% of the time - Needs redirection for appropriate language or to initiate interaction. Problem Solving Problem Solving: 4-Solves basic 75 - 89% of the time/requires cueing 10 - 24% of the time Memory Memory: 4-Recognizes or recalls 75 - 89% of the time/requires cueing 10 - 24% of the time  Pain Pain Assessment Pain Score: 6   Therapy/Group: Individual Therapy  Ellen Goris 11/13/2014, 1:50 PM

## 2014-11-13 NOTE — Progress Notes (Signed)
Physical Therapy Session Note  Patient Details  Name: Travis Romero MRN: [id number] Date of Birth: 09/14/1950  Today's Date: 11/13/2014 PT Individual Time: 1000-1100 PT Individual Time Calculation (min): 60 min   Short Term Goals: Week 2:  PT Short Term Goal 1 (Week 2): pt will perform standing activity for 10 min with min assist for balance PT Short Term Goal 2 (Week 2): pt will ambulate least restricitive AD X 175 ft with min assist PT Short Term Goal 3 (Week 2): pt will perform stand pivot transfer with no AD with supervision PT Short Term Goal 4 (Week 2): pt will negotiate 14 six inch steps with supervision and fingertip support bilat UE  Skilled Therapeutic Interventions/Progress Updates:  Session focused on dynamic balance, NMR, and functional mobility. Pt ambulated with RW requiring supervision 150 ft X 2. Pt performed Berg Balance Assessment see below for details, with score of 32/56 indicating that pt is at an increased risk for falls. Pt performed dynamic balance activity of side stepping progressed to side stepping over low objects without AD, varying from supervision to max A for balance, pt needed cuing when stepping over object to leave room for trailing foot to step over. Pt overall supervision with transfers mat to chair X 5 with and without AD and required min cuing not to push with arms due to sternal precautions. Pt had bowel movement at end of session in therapy gym and was returned to room and was left with nurse tech present who was able to assist with hygiene. Pt tolerated treatment session on room air.  Therapy Documentation Precautions:  Precautions Precautions: Fall, Sternal Precaution Comments: watch HR  Restrictions Weight Bearing Restrictions: No  Pain Faces scale, hurts a little more, generalized pain, provided rest and repositioning and pt was medicated via RN at beginning of session    Balance: Standardized Balance Assessment Standardized Balance  Assessment: Berg Balance Test Berg Balance Test Sit to Stand: Able to stand  independently using hands Standing Unsupported: Able to stand 2 minutes with supervision Sitting with Back Unsupported but Feet Supported on Floor or Stool: Able to sit 2 minutes under supervision Stand to Sit: Sits safely with minimal use of hands Transfers: Able to transfer safely, definite need of hands Standing Unsupported with Eyes Closed: Able to stand 10 seconds with supervision Standing Ubsupported with Feet Together: Able to place feet together independently and stand for 1 minute with supervision From Standing, Reach Forward with Outstretched Arm: Can reach forward >5 cm safely (2") From Standing Position, Pick up Object from Floor: Able to pick up shoe, needs supervision From Standing Position, Turn to Look Behind Over each Shoulder: Turn sideways only but maintains balance Turn 360 Degrees: Needs close supervision or verbal cueing Standing Unsupported, Alternately Place Feet on Step/Stool: Able to complete 4 steps without aid or supervision Standing Unsupported, One Foot in Front: Loses balance while stepping or standing Standing on One Leg: Unable to try or needs assist to prevent fall Total Score: 32  See FIM for current functional status  Therapy/Group: Individual Therapy  Elsie Ra 11/13/2014, 12:38 PM

## 2014-11-13 NOTE — Progress Notes (Addendum)
Advanced Heart Failure Rounding Note   Subjective:    In rehab.Getting stronger. No CP or SOB. Weight and labs stable    Objective:   Weight Range:  Vital Signs:   Temp:  [97.6 F (36.4 C)-97.9 F (36.6 C)] 97.6 F (36.4 C) (08/10 1331) Pulse Rate:  [96-104] 96 (08/10 1331) Resp:  [18-20] 20 (08/10 1331) BP: (153-161)/(73-82) 153/73 mmHg (08/10 1331) SpO2:  [95 %-98 %] 98 % (08/10 1331) Weight:  [57.38 kg (126 lb 8 oz)] 57.38 kg (126 lb 8 oz) (08/10 0537) Last BM Date: 11/13/14  Weight change: Filed Weights   11/11/14 0528 11/12/14 0616 11/13/14 0537  Weight: 57 kg (125 lb 10.6 oz) 57.9 kg (127 lb 10.3 oz) 57.38 kg (126 lb 8 oz)    Intake/Output:   Intake/Output Summary (Last 24 hours) at 11/13/14 1844 Last data filed at 11/13/14 1809  Gross per 24 hour  Intake    720 ml  Output   3800 ml  Net  -3080 ml     Physical Exam: General: . No resp difficulty. Sitting in the chair eating. Hoarse voice. HEENT: normal Neck: supple. JVP 7. Carotids 2+ bilat; no bruits. No lymphadenopathy or thryomegaly appreciated. Cor: Sternal wound ok. Regular rate & rhythm.  Lungs: decreased BS throughout Abdomen: soft, nontender, nondistended. No hepatosplenomegaly. No bruits or masses. Good bowel sounds. Extremities: no cyanosis, clubbing, rash. Chronic venous stasis changes with tr-1+ edema. RUE PICC Neuro: alert & orientedx3, cranial nerves grossly intact. moves all 4 extremities w/o difficulty. Affect pleasant     Labs: Basic Metabolic Panel:  Recent Labs Lab 11/09/14 0500 11/10/14 0542 11/11/14 0600 11/12/14 0550 11/13/14 0550  NA 134* 135 134* 136 137  K 4.8 4.7 4.3 4.5 4.4  CL 95* 98* 96* 97* 97*  CO2 31 31 29  33* 31  GLUCOSE 95 88 122* 90 100*  BUN 31* 33* 32* 29* 28*  CREATININE 1.04 1.04 1.07 1.10 1.16  CALCIUM 9.0 9.2 8.9 9.0 9.3    Liver Function Tests: No results for input(s): AST, ALT, ALKPHOS, BILITOT, PROT, ALBUMIN in the last 168 hours. No results for  input(s): LIPASE, AMYLASE in the last 168 hours. No results for input(s): AMMONIA in the last 168 hours.  CBC: No results for input(s): WBC, NEUTROABS, HGB, HCT, MCV, PLT in the last 168 hours.  Cardiac Enzymes: No results for input(s): CKTOTAL, CKMB, CKMBINDEX, TROPONINI in the last 168 hours.  BNP: BNP (last 3 results)  Recent Labs  10/12/14 1337 10/29/14 0812 11/03/14 0358  BNP 981.7* 1031.0* 699.7*    ProBNP (last 3 results) No results for input(s): PROBNP in the last 8760 hours.    Other results:  Imaging: No results found.   Medications:     Scheduled Medications: . arformoterol  15 mcg Nebulization BID  . aspirin EC  325 mg Oral Daily   Or  . aspirin  324 mg Per Tube Daily  . atorvastatin  80 mg Oral q1800  . budesonide (PULMICORT) nebulizer solution  0.5 mg Nebulization BID  . carvedilol  9.375 mg Oral BID WC  . digoxin  0.125 mg Oral Daily  . dutasteride  0.5 mg Oral Daily  . enoxaparin (LOVENOX) injection  40 mg Subcutaneous Q24H  . feeding supplement (PRO-STAT SUGAR FREE 64)  30 mL Oral BID  . furosemide  40 mg Oral Daily  . hydrocerin   Topical BID  . insulin aspart  0-9 Units Subcutaneous TID WC  . mirtazapine  45  mg Oral QHS  . nicotine  21 mg Transdermal Daily  . pantoprazole  40 mg Oral Daily  . sacubitril-valsartan  1 tablet Oral BID  . senna-docusate  2 tablet Oral QHS  . sodium chloride  10-40 mL Intracatheter Q12H  . spironolactone  12.5 mg Oral Daily  . tamsulosin  0.4 mg Oral QPC supper    Infusions:    PRN Medications: acetaminophen, ALPRAZolam, alum & mag hydroxide-simeth, benzonatate, bisacodyl, guaiFENesin-dextromethorphan, levalbuterol, lidocaine, methocarbamol, ondansetron **OR** ondansetron (ZOFRAN) IV, oxyCODONE, sodium chloride, traMADol, traZODone   Assessment:   1. ICM---> CABG x3 7/20 IMA-LAD;SVG to OM,SVG to Distal RCA (N/A). On statin, bb, aspirin  2. Acute Systolic Heart Failure- ECHO 10/12/14 EF 20% ICM per [facility]   3. Physical deconditioning 4. Severe protein calorie malnutrition  5. Hoarseness 6. HTN   Plan/Discussion:    Making progress. HF stable. Labs and weight stable. BP elevated. I will increase Entresto. I d/c'd daily BMET. Can check 1-2x/week.   Length of Stay: 9   Glori Bickers  MD  11/13/2014, 6:44 PM  Advanced Heart Failure Team Pager [phone] (M-F; [place])  Please contact [facility] for night-coverage after hours (4p -7a ) and weekends on [email or web address]

## 2014-11-13 NOTE — Progress Notes (Signed)
Occupational Therapy Session Note  Patient Details  Name: Travis Romero MRN: [id number] Date of Birth: 10-31-1950  Today's Date: 11/13/2014 OT Individual Time: 0700-0800 OT Individual Time Calculation (min): 60 min    Short Term Goals: Week 2:  OT Short Term Goal 1 (Week 2): Pt will complete LB dressing with mod A sit<>stand to increase functional independence.  OT Short Term Goal 2 (Week 2): Pt will complete UB dressing with supervision OT Short Term Goal 3 (Week 2): Pt will complete bathing tasks with min A OT Short Term Goal 4 (Week 2): Pt will perform toilet transfers with mod A  Skilled Therapeutic Interventions/Progress Updates:    Pt awake and alert, resting in bed upon arrival.  Pt performed sit->stand from EOB employing appropriate technique adhering to sternal precautions, in preparation for amb with RW approx 5' to w/c.  Pt engaged in upper body bathing and dressing tasks seated and standing at sink.  Pt required steady A while standing at sink to don button up shirt.  Pt continues to required assist with fastening buttons on shirt.  Pt did not have any clean pants and opted to leave pants on, stating "they" had just cleaned his bottom.  Pt requires more than a reasonable amount of time to complete tasks with multiple rest breaks.  Pt O2 sats>90% on RA throughout session.  Focus on activity tolerance, sit<>stand, standing balance, functional transfers, task initiation, sequencing, and safety awareness.  Therapy Documentation Precautions:  Precautions Precautions: Fall, Sternal Precaution Comments: watch HR  Restrictions Weight Bearing Restrictions: No Pain: Pain Assessment Pain Assessment: 0-10 Pain Score: 4  Pain Type: Acute pain Pain Location: Generalized Pain Onset: On-going Pain Intervention(s): RN made aware;Emotional support  See FIM for current functional status  Therapy/Group: Individual Therapy  [name] 11/13/2014, 8:02 AM

## 2014-11-13 NOTE — Progress Notes (Signed)
Occupational Therapy Note  Patient Details  Name: Travis Romero MRN: [id number] Date of Birth: 08-24-1950  Today's Date: 11/13/2014 OT Individual Time: 1400-1430 OT Individual Time Calculation (min): 30 min   Pt denied pain Individual therapy  Pt practiced bed mobility on bed in ADL apartment, functional amb with RW for simple home mgmt tasks, and discussion on bathing arrangements at home.  Pt's bathroom will not accommodate tub bench.  Pt agrees that he will need to sit to take showers at home.  Pt performed sit<>stand with supervision while adhering to sternal precautions.  Pt exhibited significant improvements with safety awareness this afternoon.    Leotis Shames [facility] 11/13/2014, 3:08 PM

## 2014-11-13 NOTE — Progress Notes (Signed)
Social Work Patient ID: Travis Romero, male   DOB: Jan 28, 1951, 64 y.o.   MRN: [id number]   Met with pt this morning and spoke with sister, Travis Romero, via phone to review team conference.  Have to explained to both that pt has made gains this week and certainly witnessing an increase in overall endurance and improved cognition.  Team still recommends, however, that pt have 24/7 assistance at home as they do not feel he will be at a safe, functional level to manage alone.  Explained to both that, if this cannot be provided, then only two options are to hire private duty or consider SNF.  Sister now reports that she will plan to return to [facility] the weekend prior to his targeted d/c date and could stay approx one month with pt.  Will discuss with team and await next conference to determine if this amount of time commitment should be sufficient.    Javis Abboud, LCSW

## 2014-11-14 ENCOUNTER — Inpatient Hospital Stay (HOSPITAL_COMMUNITY): Payer: PRIVATE HEALTH INSURANCE | Admitting: Speech Pathology

## 2014-11-14 ENCOUNTER — Inpatient Hospital Stay (HOSPITAL_COMMUNITY): Payer: PRIVATE HEALTH INSURANCE

## 2014-11-14 ENCOUNTER — Inpatient Hospital Stay (HOSPITAL_COMMUNITY): Payer: PRIVATE HEALTH INSURANCE | Admitting: Physical Therapy

## 2014-11-14 DIAGNOSIS — M797 Fibromyalgia: Secondary | ICD-10-CM

## 2014-11-14 LAB — GLUCOSE, CAPILLARY
GLUCOSE-CAPILLARY: 115 mg/dL — AB (ref 65–99)
GLUCOSE-CAPILLARY: 125 mg/dL — AB (ref 65–99)
Glucose-Capillary: 94 mg/dL (ref 65–99)
Glucose-Capillary: 109 mg/dL — ABNORMAL HIGH (ref 65–99)

## 2014-11-14 MED ORDER — MUSCLE RUB 10-15 % EX CREA
TOPICAL_CREAM | Freq: Two times a day (BID) | CUTANEOUS | Status: DC | PRN
  Administered 2014-11-23: 1 via TOPICAL
  Filled 2014-11-14: qty 85

## 2014-11-14 NOTE — Progress Notes (Signed)
Speech Language Pathology Daily Session Note  Patient Details  Name: Travis Romero MRN: [id number] Date of Birth: 1950-05-01  Today's Date: 11/14/2014 SLP Individual Time: 1500-1530 SLP Individual Time Calculation (min): 30 min  Short Term Goals: Week 2: SLP Short Term Goal 1 (Week 2): Patient will utilize external memory aids to recall new, daily information with Min A multimodal cues. SLP Short Term Goal 2 (Week 2): Patient will demonstrate sustained attention to a functional task for 60 minutes with Min verbal cues for redirection.  SLP Short Term Goal 3 (Week 2): Patient will self-monitor and correct errors with functional tasks with Min verbal and question cues.  SLP Short Term Goal 4 (Week 2): Patient will utilize an increased vocal intensity at the phrase level with supervision verbal cues to increase intelligibility to 100%.  SLP Short Term Goal 5 (Week 2): Patient will demonstrate functional problem for basic and familiar tasks with Min A multimodal cues.   Skilled Therapeutic Interventions: Skilled treatment session focused on addressing cognition goals. Patient was Mod I for use of external aid for recall of daily events.  As a result, SLP facilitated session with initial education regarding other effective memory compensatory strategies: association and repetition.  During a structured working memory task, patient required Min faded to Supervision cues to utilize these strategies.  Of note, patient with increased intelligibility due to improved vocal intensity; vocal quality is also improved! Continue with current plan of care.   FIM:  Comprehension Comprehension Mode: Auditory Comprehension: 5-Follows basic conversation/direction: With no assist Expression Expression Mode: Verbal Expression: 5-Expresses basic 90% of the time/requires cueing < 10% of the time. Social Interaction Social Interaction: 5-Interacts appropriately 90% of the time - Needs monitoring or encouragement  for participation or interaction. Problem Solving Problem Solving: 5-Solves basic 90% of the time/requires cueing < 10% of the time Memory Memory: 5-Recognizes or recalls 90% of the time/requires cueing < 10% of the time  Pain Pain Assessment Pain Assessment: No/denies pain  Therapy/Group: Individual Therapy  Carmelia Roller., [place] [phone]  [place] 11/14/2014, 4:24 PM

## 2014-11-14 NOTE — Progress Notes (Signed)
[facility] PHYSICAL MEDICINE & REHABILITATION     PROGRESS NOTE    Subjective/Complaints: Discussed with Dr Roxan Hockey Still has occ CP Also has neck and upper back pain , no prior hx ROS:  nausea, vomiting, abdominal pain, diarrhea, chest pain,  palpitations, no neck pain which radiates to arms   Objective: Vital Signs: Blood pressure 162/88, pulse 109, temperature 98.7 F (37.1 C), temperature source Oral, resp. rate 18, height 5\' 7"  (1.702 m), weight 57.6 kg (126 lb 15.8 oz), SpO2 95 %. No results found. No results for input(s): WBC, HGB, HCT, PLT in the last 72 hours.  Recent Labs  11/12/14 0550 11/13/14 0550  NA 136 137  K 4.5 4.4  CL 97* 97*  GLUCOSE 90 100*  BUN 29* 28*  CREATININE 1.10 1.16  CALCIUM 9.0 9.3   CBG (last 3)   Recent Labs  11/13/14 1619 11/13/14 2056 11/14/14 0649  GLUCAP 111* 135* 94    Wt Readings from Last 3 Encounters:  11/14/14 57.6 kg (126 lb 15.8 oz)  11/04/14 56.2 kg (123 lb 14.4 oz)  10/07/14 68.493 kg (151 lb)    Physical Exam:  Constitutional: He is oriented to person, place, and time. He appears well-developed and well-nourished.  Frail appearing male  HENT: oral mucosa a little dry Head: Normocephalic and atraumatic.  Eyes: Conjunctivae are normal. Pupils are equal, round, and reactive to light.  Neck: Normal range of motion. Neck supple. Tenderness over upper traps Cardiovascular: Normal rate and regular rhythm. no murmurs, rubs, gallops Respiratory: Effort normal. Fair air movement. ?decrease in RUL. He has no wheezes. No distress GI: Soft. Bowel sounds are normal. He exhibits no distension. There is no tenderness.  Musculoskeletal: He exhibits trace tibial edema.Marland Kitchen He exhibits no tenderness.  Neurological: He is alert and oriented to person, place, and time. Reasonable insight and awareness.  Dysphonic speech  improving. Able to follow one and two step commands without difficulty.  UE: 4- deltoid, 4  bicep,right  tricep, wrist, hi LUE: tricep 3+ to 4-, wrist 3+, HI 3, 4th/5th finger drop. LE: 3 hf, 3+ ke and 4 adf/apf  Skin: Skin is warm and dry.  Dry skin bilateral shins. RLE incisions healing well--clean,dry and intact. Small scabbed abrasion below left knee laterally.chest wall incision intact  Psychiatric: Thought content generally normal. Pleasant mood.   Assessment/Plan: 1. Functional deficits secondary to debility after CABG and multiple medical  which require 3+ hours per day of interdisciplinary therapy in a comprehensive inpatient rehab setting. Physiatrist is providing close team supervision and 24 hour management of active medical problems listed below. Physiatrist and rehab team continue to assess barriers to discharge/monitor patient progress toward functional and medical goals. FIM: FIM - Bathing Bathing Steps Patient Completed: Chest, Right Arm, Left Arm, Abdomen, Front perineal area, Right upper leg, Left upper leg, Buttocks Bathing: 4: Min-Patient completes 8-9 68f 10 parts or 75+ percent  FIM - Upper Body Dressing/Undressing Upper body dressing/undressing steps patient completed: Thread/unthread right sleeve of front closure shirt/dress, Thread/unthread left sleeve of front closure shirt/dress, Pull shirt around back of front closure shirt/dress Upper body dressing/undressing: 4: Min-Patient completed 75 plus % of tasks FIM - Lower Body Dressing/Undressing Lower body dressing/undressing steps patient completed: Thread/unthread right pants leg, Pull pants up/down Lower body dressing/undressing: 2: Max-Patient completed 25-49% of tasks  FIM - Toileting Toileting: 0: Activity did not occur  FIM - Air cabin crew Transfers: 2-To toilet/BSC: Max A (lift and lower assist), 2-From toilet/BSC: Max A (  lift and lower assist)  FIM - Bed/Chair Transfer Bed/Chair Transfer Assistive Devices: Adult nurse Transfer: 5: Supine > Sit: Supervision (verbal cues/safety  issues), 5: Bed > Chair or W/C: Supervision (verbal cues/safety issues)  FIM - Locomotion: Wheelchair Distance: 100 Locomotion: Wheelchair: 0: Activity did not occur FIM - Locomotion: Ambulation Locomotion: Ambulation Assistive Devices: Administrator Ambulation/Gait Assistance: 4: Min guard Locomotion: Ambulation: 5: Travels 150 ft or more with supervision/safety issues  Comprehension Comprehension Mode: Auditory Comprehension: 5-Follows basic conversation/direction: With no assist  Expression Expression Mode: Verbal Expression: 4-Expresses basic 75 - 89% of the time/requires cueing 10 - 24% of the time. Needs helper to occlude trach/needs to repeat words.  Social Interaction Social Interaction: 4-Interacts appropriately 75 - 89% of the time - Needs redirection for appropriate language or to initiate interaction.  Problem Solving Problem Solving: 4-Solves basic 75 - 89% of the time/requires cueing 10 - 24% of the time  Memory Memory: 4-Recognizes or recalls 75 - 89% of the time/requires cueing 10 - 24% of the time  Medical Problem List and Plan: 1. Functional deficits secondary to debility after acute respiratory failure and CHF exacerbation s/p CABG.  -  -weaning O2 to keep sats >90% 2. DVT Prophylaxis/Anticoagulation: Pharmaceutical: Lovenox due to VTE risk is required 3. Pain Management: oxycodone prn---observe activity tolerance with therapy 4. Mood: Increase Remeron to home dose to help manage insomnia and mood. Team to provide ego support. LCSW to follow for evaluation and support.  5. Neuropsych: This patient is capable of making decisions on his own behalf. 6. Skin/Wound Care: Routine pressure relief measures. Maintain adequate nutritional intake. Offer supplements between meals.  7. Fluids/Electrolytes/Nutrition: Monitor I/O. Daily labs to monitor metabolic/electrolyte status, ~1015ml fluid on 8/8.  100% meals 8. CAD s/p CABG: Continue sternal precautions.  Continues to have incisional pain but no CP. On ASA, lipitor, coreg and Entresto.  9. Acute on systolic CHF: Monitor daily weights. Low salt diet.   aldactone, coreg, Entestor and digoxin.  -lasix 40mg   - Cardiology following also.no recent med changes  -all labs personally reviewed again today---BUN and creat normalizing  10 COPD: Avoiding Spiriva with BPH/voiding difficulties. Continue Brovan/pulmicort with prn Xopenex. To be discharged on symbicort 80/4.5 2 puffs bid + prn xopenex  -outpt follow up with pulmonary at dc  -lungs remain generally clear but with reduced air flow. IS   11. ABLA: Slowly improving. Labs reviewed. Steady today 12. HTN: Soft blood pressures but asymptomatic at present. Observe for activity tolerance.  13. Urinary retention/BPH: continue Avodart.  ICP with high volumes, add flomax monitor BP tolerance  -no spont voids, flomax x 1 nite only cont current dose 14. Adjustment disorder: Mentation improving gradually.Continue Xanax for chronic anxiety/ adjustment reaction.  15. Reactive leucocytosis: Likely due to steroids/surgery. No signs of infection on exam. Continue to monitor 16. Stress induced hyperglycemia: Hgb A1C- 5.9. Improved    LOS (Days) 10 A FACE TO FACE EVALUATION WAS PERFORMED  Charlett Blake 11/14/2014 9:25 AM

## 2014-11-14 NOTE — Progress Notes (Signed)
Occupational Therapy Session Note  Patient Details  Name: Travis Romero MRN: [id number] Date of Birth: 21-Feb-1951  Today's Date: 11/14/2014 OT Individual Time: 0700-0800 OT Individual Time Calculation (min): 60 min    Short Term Goals: Week 2:  OT Short Term Goal 1 (Week 2): Pt will complete LB dressing with mod A sit<>stand to increase functional independence.  OT Short Term Goal 2 (Week 2): Pt will complete UB dressing with supervision OT Short Term Goal 3 (Week 2): Pt will complete bathing tasks with min A OT Short Term Goal 4 (Week 2): Pt will perform toilet transfers with mod A  Skilled Therapeutic Interventions/Progress Updates:    Pt engaged in BADL retraining including bathing and dressing with sit<>stand from w/c at sink.  Pt was able to pull up pants this morning in addition to doffing and donning shirt while standing.  Pt continues to require assistance with threading pants and donning socks.  Pt employs appropriate techniques for sit<>stand and maintains standing balance without UE support.  Pt continues to require more than a reasonable amount of time to complete tasks with multiple rest breaks. Pt continues to require assistance with buttoning shirts.  Pt reminded therapist to assist with recapping therapy session on schedule to use as a memory aid later in the day.  Focus on activity tolerance, sit<>stand, standing balance, increased LUE use,functional amb with RW, and safety awareness.  Therapy Documentation Precautions:  Precautions Precautions: Fall, Sternal Precaution Comments: watch HR  Restrictions Weight Bearing Restrictions: No Pain: Pain Assessment Pain Assessment: 0-10 Pain Score: 7  Pain Type: Chronic pain Pain Location: Generalized Pain Descriptors / Indicators: Discomfort Pain Frequency: Occasional Pain Onset: On-going Pain Intervention(s): RN made aware;Repositioned;Emotional support  See FIM for current functional status  Therapy/Group:  Individual Therapy  [name] 11/14/2014, 8:01 AM

## 2014-11-14 NOTE — Progress Notes (Signed)
      [address]       [place] [address]             [phone]      Sitting up in chair, ate all of his breakfast  No complaints feels he is getting stronger  BP 162/88 mmHg  Pulse 109  Temp(Src) 98.7 F (37.1 C) (Oral)  Resp 18  Ht 5\' 7"  (1.702 m)  Wt 126 lb 15.8 oz (57.6 kg)  BMI 19.88 kg/m2  SpO2 95%   Intake/Output Summary (Last 24 hours) at 11/14/14 0902 Last data filed at 11/14/14 5093  Gross per 24 hour  Intake   1326 ml  Output   4200 ml  Net  -2874 ml   Incisions healing well  Continues to improve with rehab  Remo Lipps C. Roxan Hockey, MD [facility] [phone]

## 2014-11-14 NOTE — Progress Notes (Signed)
Speech Language Pathology Daily Session Note  Patient Details  Name: Travis Romero MRN: [id number] Date of Birth: 11/05/1950  Today's Date: 11/14/2014 SLP Individual Time: 0900-1000 SLP Individual Time Calculation (min): 60 min  Short Term Goals: Week 2: SLP Short Term Goal 1 (Week 2): Patient will utilize external memory aids to recall new, daily information with Min A multimodal cues. SLP Short Term Goal 2 (Week 2): Patient will demonstrate sustained attention to a functional task for 60 minutes with Min verbal cues for redirection.  SLP Short Term Goal 3 (Week 2): Patient will self-monitor and correct errors with functional tasks with Min verbal and question cues.  SLP Short Term Goal 4 (Week 2): Patient will utilize an increased vocal intensity at the phrase level with supervision verbal cues to increase intelligibility to 100%.  SLP Short Term Goal 5 (Week 2): Patient will demonstrate functional problem for basic and familiar tasks with Min A multimodal cues.   Skilled Therapeutic Interventions: Skilled treatment session focused on cognitive goals. SLP facilitated session by providing supervision question cues for recall of events from previous therapy sessions. Patient also utilized external memory aids to recall new, daily information and organized information appropriately with supervision verbal cues.  Patient completed a mildly complex medication management task with supervision verbal and question cues for problem solving with task. Patient's vocal quality continues to improve and patient was 100% intelligible at the conversation level. Patient independently requested to use the bathroom and required supervision question cues for sequencing and safety with task. Patient left upright in wheelchair with quick release belt in place and all needs within reach. Continue with current plan of care.   FIM:  Comprehension Comprehension Mode: Auditory Comprehension: 5-Follows basic  conversation/direction: With no assist Expression Expression Mode: Verbal Expression: 5-Expresses basic 90% of the time/requires cueing < 10% of the time. Social Interaction Social Interaction: 5-Interacts appropriately 90% of the time - Needs monitoring or encouragement for participation or interaction. Problem Solving Problem Solving: 5-Solves basic 90% of the time/requires cueing < 10% of the time Memory Memory: 5-Recognizes or recalls 90% of the time/requires cueing < 10% of the time  Pain Pain Assessment Pain Assessment: No/denies pain  Therapy/Group: Individual Therapy  Elianie Hubers 11/14/2014, 5:04 PM

## 2014-11-14 NOTE — Progress Notes (Signed)
Physical Therapy Session Note  Patient Details  Name: Conley Delisle MRN: [id number] Date of Birth: 28-Apr-1950  Today's Date: 11/14/2014 PT Individual Time: 1335-1435 PT Individual Time Calculation (min): 60 min   Short Term Goals: Week 2:  PT Short Term Goal 1 (Week 2): pt will perform standing activity for 10 min with min assist for balance PT Short Term Goal 2 (Week 2): pt will ambulate least restricitive AD X 175 ft with min assist PT Short Term Goal 3 (Week 2): pt will perform stand pivot transfer with no AD with supervision PT Short Term Goal 4 (Week 2): pt will negotiate 14 six inch steps with supervision and fingertip support bilat UE  Skilled Therapeutic Interventions/Progress Updates:   Session focused on community ambulation, functional transfers, and activity tolerance. Gait using RW room > therapy gym with supervision and min verbal cues for increased stride length. Patient transported outside in wheelchair for energy conservation. Gait using RW in outdoor environment including uneven surfaces, brick and concrete, and slight inclines/declines with supervision up to 150 ft at a time. Performed transfers to variety of surfaces with cues for safe hand placement and technique with supervision overall, one episode of min A with increased fatigue. Patient negotiated down 5 brick steps using R rail with S except one episode of min A when he did not completely clear step. Patient performed simulated car transfer to sedan height using RW with supervision and max verbal cues for sequencing and technique and more than reasonable amount of time. Patient ambulated about 150 ft back towards room and requested to return to bed. Patient left semi reclined in bed with all needs within reach and bed alarm on, NT present.   Therapy Documentation Precautions:  Precautions Precautions: Fall, Sternal Precaution Comments: watch HR  Restrictions Weight Bearing Restrictions: No Pain: Pain  Assessment Pain Assessment: No/denies pain Locomotion : Ambulation Ambulation/Gait Assistance: 5: Supervision   See FIM for current functional status  Therapy/Group: Individual Therapy  Laretta Alstrom 11/14/2014, 2:16 PM

## 2014-11-15 ENCOUNTER — Inpatient Hospital Stay (HOSPITAL_COMMUNITY): Payer: PRIVATE HEALTH INSURANCE

## 2014-11-15 ENCOUNTER — Inpatient Hospital Stay (HOSPITAL_COMMUNITY): Payer: PRIVATE HEALTH INSURANCE | Admitting: Physical Therapy

## 2014-11-15 ENCOUNTER — Inpatient Hospital Stay (HOSPITAL_COMMUNITY): Payer: PRIVATE HEALTH INSURANCE | Admitting: Speech Pathology

## 2014-11-15 LAB — GLUCOSE, CAPILLARY
GLUCOSE-CAPILLARY: 93 mg/dL (ref 65–99)
GLUCOSE-CAPILLARY: 137 mg/dL — AB (ref 65–99)
GLUCOSE-CAPILLARY: 105 mg/dL — AB (ref 65–99)
Glucose-Capillary: 113 mg/dL — ABNORMAL HIGH (ref 65–99)

## 2014-11-15 MED ORDER — TAMSULOSIN HCL 0.4 MG PO CAPS
0.8000 mg | ORAL_CAPSULE | Freq: Every day | ORAL | Status: DC
  Administered 2014-11-15 – 2014-11-25 (×11): 0.8 mg via ORAL
  Filled 2014-11-15 (×11): qty 2

## 2014-11-15 NOTE — Progress Notes (Signed)
Occupational Therapy Session Note  Patient Details  Name: Travis Romero MRN: [id number] Date of Birth: 1950/10/27  Today's Date: 11/15/2014 OT Individual Time: 0700-0800 OT Individual Time Calculation (min): 60 min    Short Term Goals: Week 2:  OT Short Term Goal 1 (Week 2): Pt will complete LB dressing with mod A sit<>stand to increase functional independence.  OT Short Term Goal 2 (Week 2): Pt will complete UB dressing with supervision OT Short Term Goal 3 (Week 2): Pt will complete bathing tasks with min A OT Short Term Goal 4 (Week 2): Pt will perform toilet transfers with mod A  Skilled Therapeutic Interventions/Progress Updates:    Pt resting in bed upon arrival.  Pt stated he didn't rest well during the night and is more lethargic this morning.  Pt was able to sit EOB and perform sit->stand with supervision (required 3 attempts for successful sit->stand). Pt did not have any clean clothing and opted to bathe UB only this morning and don shirt worn during the night.  Pt requested to shave and initiated task but required assistance to complete shaving task this morning.  Pt initiated buttoning shirt but required assistance to complete task.  Pt performed sit<>stand at sink X 5 with supervision.  Pt requires more than a reasonable amount of time to complete all tasks with multiple rest breaks.  Focus on functional transfers, sit<>stand, dynamic standing balance, task initiation, activity tolerance, and safety awareness.  Therapy Documentation Precautions:  Precautions Precautions: Fall, Sternal Precaution Comments: watch HR  Restrictions Weight Bearing Restrictions: No Pain:  Pt c/o generalized pain of approx 7/10; RN aware and repositioned  See FIM for current functional status  Therapy/Group: Individual Therapy  [name] 11/15/2014, 8:03 AM

## 2014-11-15 NOTE — Plan of Care (Signed)
Problem: RH BLADDER ELIMINATION Goal: RH STG MANAGE BLADDER WITH EQUIPMENT WITH ASSISTANCE STG Manage Bladder With Equipment With Assistance. Min A  Outcome: Not Progressing Requiring I&O cath

## 2014-11-15 NOTE — Progress Notes (Signed)
Physical Therapy Session Note  Patient Details  Name: Travis Romero MRN: [id number] Date of Birth: February 11, 1951  Today's Date: 11/15/2014 PT Individual Time: 1530-1630 PT Individual Time Calculation (min): 60 min   Short Term Goals: Week 2:  PT Short Term Goal 1 (Week 2): pt will perform standing activity for 10 min with min assist for balance PT Short Term Goal 2 (Week 2): pt will ambulate least restricitive AD X 175 ft with min assist PT Short Term Goal 3 (Week 2): pt will perform stand pivot transfer with no AD with supervision PT Short Term Goal 4 (Week 2): pt will negotiate 14 six inch steps with supervision and fingertip support bilat UE  Skilled Therapeutic Interventions/Progress Updates:  Session focused on NMR, gait training, and activity tolerance. Pt ambulated with RW 175 ft X 2 with supervision. Pt ambulated forward, backward, and laterally around cones clockwise and counterclockwise without AD requiring overall min assist for balance. Pt performed standing pipe tree puzzle intermediate level X 2 with min cuing and without rest break for 15 min, demonstrating improved activity tolerance. Pt negotiated up/down 12 stairs using bilat UE touchdown support using reciprocal pattern requiring supervision.Pt ambulated without RW X 75 feet with supervision demonstrating wide BOS, bilat toe out, and decreased velocity with  cueing to look up and stand tall. Pt overall supervision with transfers except one incident of total assist where pt transferred weight too far forward during sit to stand with RW and started to stumble forward causing PT to use total assist to catch pt from falling to floor, pt was returned to mat table where he verbally stated that he was fine, had no increase in pain, and "I don't know what happened, I was testing you, good thing you caught me." Pt returned to room and positioned in bed with supervision and all needs in reach.  Therapy Documentation Precautions:   Precautions Precautions: Fall, Sternal Precaution Comments: watch HR  Restrictions Weight Bearing Restrictions: No (Sternal precautions) Pain: Pain Assessment Pain Score: 6  Faces Pain Scale: Hurts little more Pain Type: Chronic pain Pain Location: Generalized Pain Orientation: Right;Left Pain Descriptors / Indicators: Aching Pain Frequency: Intermittent Pain Onset: Gradual Pain Intervention(s): Emotional support;Repositioned   See FIM for current functional status  Therapy/Group: Individual Therapy  Elsie Ra 11/15/2014, 4:48 PM

## 2014-11-15 NOTE — Plan of Care (Signed)
Problem: RH PAIN MANAGEMENT Goal: RH STG PAIN MANAGED AT OR BELOW PT'S PAIN GOAL <5  Outcome: Not Progressing Reports pain as 7 or 8

## 2014-11-15 NOTE — Progress Notes (Signed)
Speech Language Pathology Daily Session Notes  Patient Details  Name: Travis Romero MRN: [id number] Date of Birth: 1951-02-04  Today's Date: 11/15/2014  Session 1: SLP Individual Time: 1000-1100 SLP Individual Time Calculation (min): 60 min  Session 2: SLP Individual Time: 7858-8502 SLP Individual Time Calculation (min): 30 min  Short Term Goals: Week 2: SLP Short Term Goal 1 (Week 2): Patient will utilize external memory aids to recall new, daily information with Min A multimodal cues. SLP Short Term Goal 2 (Week 2): Patient will demonstrate sustained attention to a functional task for 60 minutes with Min verbal cues for redirection.  SLP Short Term Goal 3 (Week 2): Patient will self-monitor and correct errors with functional tasks with Min verbal and question cues.  SLP Short Term Goal 4 (Week 2): Patient will utilize an increased vocal intensity at the phrase level with supervision verbal cues to increase intelligibility to 100%.  SLP Short Term Goal 5 (Week 2): Patient will demonstrate functional problem for basic and familiar tasks with Min A multimodal cues.   Skilled Therapeutic Interventions:  Session 1: Skilled treatment session focused on cognitive goals. SLP facilitated session by providing supervision question cues for recall of events from previous therapy sessions. Patient also utilized external memory aids to recall new, daily information and organized information appropriately with supervision verbal cues.  Patient required Mod A question cues for recall of rules to a novel task that was completed in a previous therapy session and required Mod-Max A question and verbal cues to utilize memory compensatory strategies to complete task efficiently. Suspect decreased cognitive function was impacted by patient's constant need to void. Patient attempted to void X 2 throughout session without success and required Mod A question cues for safety with task.  Patient left upright in  wheelchair with quick release belt in place and all needs within reach. Continue with current plan of care.   Session 2: Skilled treatment session focused on cognitive goals. SLP facilitated session by providing supervision verbal cues for divided attention between self-feeding and functional conversation in regards to anticipatory awareness and d/c planning with supervision verbal cues for 25 minutes. Patient appeared more alert this session and recalled events from previous therapy sessions with superversion question cues.  Patient left upright in wheelchair with quick release belt in place and all needs within reach. Continue with current plan of care.     FIM:  Comprehension Comprehension Mode: Auditory Comprehension: 5-Follows basic conversation/direction: With no assist Expression Expression Mode: Verbal Expression: 5-Expresses basic needs/ideas: With no assist Social Interaction Social Interaction: 4-Interacts appropriately 75 - 89% of the time - Needs redirection for appropriate language or to initiate interaction. Problem Solving Problem Solving: 4-Solves basic 75 - 89% of the time/requires cueing 10 - 24% of the time Memory Memory: 4-Recognizes or recalls 75 - 89% of the time/requires cueing 10 - 24% of the time FIM - Eating Eating Activity: 5: Set-up assist for open containers  Pain Pain Assessment Pain Score: 6  Faces Pain Scale: Hurts little more Pain Type: Chronic pain Pain Location: Generalized Pain Orientation: Right;Left Pain Descriptors / Indicators: Aching Pain Frequency: Intermittent Pain Onset: Gradual Pain Intervention(s): Emotional support;Repositioned  Therapy/Group: Individual Therapy  Atoya Andrew 11/15/2014, 4:34 PM

## 2014-11-15 NOTE — Progress Notes (Signed)
During I&O Cath, resistance and drops of blood noted per NT. Foley catheter discontinued on 8/7, pt has not voided on his own yet. Reported to oncoming RN.

## 2014-11-15 NOTE — Progress Notes (Signed)
[facility]     PROGRESS NOTE    Subjective/Complaints: No issues overnite, has urge to "go" but cannot tell whether it is for urine or bowels ROS:  nausea, vomiting, abdominal pain, diarrhea, chest pain,  palpitations, no neck pain which radiates to arms   Objective: Vital Signs: Blood pressure 144/86, pulse 98, temperature 97.8 F (36.6 C), temperature source Oral, resp. rate 18, height 5\' 7"  (1.702 m), weight 57.9 kg (127 lb 10.3 oz), SpO2 97 %. No results found. No results for input(s): WBC, HGB, HCT, PLT in the last 72 hours.  Recent Labs  11/13/14 0550  NA 137  K 4.4  CL 97*  GLUCOSE 100*  BUN 28*  CREATININE 1.16  CALCIUM 9.3   CBG (last 3)   Recent Labs  11/14/14 1650 11/14/14 2122 11/15/14 0658  GLUCAP 115* 125* 93    Wt Readings from Last 3 Encounters:  11/15/14 57.9 kg (127 lb 10.3 oz)  11/04/14 56.2 kg (123 lb 14.4 oz)  10/07/14 68.493 kg (151 lb)    Physical Exam:  Constitutional: He is oriented to person, place, and time. He appears well-developed and well-nourished.  Frail appearing male  HENT: oral mucosa a little dry Head: Normocephalic and atraumatic.  Eyes: Conjunctivae are normal. Pupils are equal, round, and reactive to light.  Neck: Normal range of motion. Neck supple. Tenderness over upper traps Cardiovascular: Normal rate and regular rhythm. no murmurs, rubs, gallops Respiratory: Effort normal. Equal BS. He has no wheezes. No distress GI: Soft. Bowel sounds are normal. He exhibits no distension. There is no tenderness.  Musculoskeletal: He exhibits trace tibial edema.Marland Kitchen He exhibits no tenderness.  Neurological: He is alert and oriented to person, place, and time. Reasonable insight and awareness.  Dysphonic speech mild. Able to follow one and two step commands without difficulty.  UE: 4- deltoid, 4 bicep,right  tricep, wrist, hi LUE: tricep 3+ to 4-, wrist 3+, HI 3, 4th/5th finger drop. LE: 3  hf, 3+ ke and 4 adf/apf  Skin: Skin is warm and dry.  Midline sternal incision CDI, mild tenderness Small scabbed abrasion below left knee laterally.chest wall incision intact  Psychiatric: Thought content generally normal. Pleasant mood.   Assessment/Plan: 1. Functional deficits secondary to debility after CABG and multiple medical  which require 3+ hours per day of interdisciplinary therapy in a comprehensive inpatient rehab setting. Physiatrist is providing close team supervision and 24 hour management of active medical problems listed below. Physiatrist and rehab team continue to assess barriers to discharge/monitor patient progress toward functional and medical goals. FIM: FIM - Bathing Bathing Steps Patient Completed: Chest, Right Arm, Left Arm, Abdomen, Front perineal area, Right upper leg, Left upper leg, Buttocks Bathing: 0: Activity did not occur  FIM - Upper Body Dressing/Undressing Upper body dressing/undressing steps patient completed: Thread/unthread right sleeve of front closure shirt/dress, Thread/unthread left sleeve of front closure shirt/dress, Pull shirt around back of front closure shirt/dress Upper body dressing/undressing: 4: Min-Patient completed 75 plus % of tasks FIM - Lower Body Dressing/Undressing Lower body dressing/undressing steps patient completed: Thread/unthread right pants leg, Pull pants up/down Lower body dressing/undressing: 0: Activity did not occur  FIM - Toileting Toileting: 0: Activity did not occur  FIM - Air cabin crew Transfers: 2-To toilet/BSC: Max A (lift and lower assist), 2-From toilet/BSC: Max A (lift and lower assist)  FIM - Control and instrumentation engineer Devices: Adult nurse Transfer: 5: Bed > Chair or W/C: Supervision (verbal cues/safety issues),  5: Chair or W/C > Bed: Supervision (verbal cues/safety issues)  FIM - Locomotion: Wheelchair Distance: 100 Locomotion: Wheelchair: 1: Total  Assistance/staff pushes wheelchair (Pt<25%) FIM - Locomotion: Ambulation Locomotion: Ambulation Assistive Devices: Administrator Ambulation/Gait Assistance: 5: Supervision Locomotion: Ambulation: 5: Travels 150 ft or more with supervision/safety issues  Comprehension Comprehension Mode: Auditory Comprehension: 5-Follows basic conversation/direction: With no assist  Expression Expression Mode: Verbal Expression: 5-Expresses basic 90% of the time/requires cueing < 10% of the time.  Social Interaction Social Interaction: 5-Interacts appropriately 90% of the time - Needs monitoring or encouragement for participation or interaction.  Problem Solving Problem Solving: 5-Solves basic 90% of the time/requires cueing < 10% of the time  Memory Memory Assistive Devices: Memory book Memory: 5-Recognizes or recalls 90% of the time/requires cueing < 10% of the time  Medical Problem List and Plan: 1. Functional deficits secondary to debility after acute respiratory failure and CHF exacerbation s/p CABG.  -  -weaning O2 to keep sats >90% 2. DVT Prophylaxis/Anticoagulation: Pharmaceutical: Lovenox due to VTE risk is required 3. Pain Management: oxycodone prn---observe activity tolerance with therapy 4. Mood: Increase Remeron to home dose to help manage insomnia and mood. Team to provide ego support. LCSW to follow for evaluation and support.  5. Neuropsych: This patient is capable of making decisions on his own behalf. 6. Skin/Wound Care: Routine pressure relief measures. Maintain adequate nutritional intake. Offer supplements between meals.  7. Fluids/Electrolytes/Nutrition: Monitor I/O. Daily labs to monitor metabolic/electrolyte status, ~1074ml fluid on 8/11.  100% meals 8. CAD s/p CABG: Continue sternal precautions. Continues to have incisional pain but no CP. On ASA, lipitor, coreg and Entresto.  9. Acute on systolic CHF: Monitor daily weights. Low salt diet.   aldactone, coreg,  Entestor and digoxin.  -lasix 40mg   - Cardiology following also.no recent med changes  -all labs personally reviewed again today---BUN and creat normalizing  10 COPD: Avoiding Spiriva with BPH/voiding difficulties. Continue Brovan/pulmicort with prn Xopenex. To be discharged on symbicort 80/4.5 2 puffs bid + prn xopenex  -outpt follow up with pulmonary at dc  -lungs remain generally clear but with reduced air flow. IS   11. ABLA: Slowly improving. Labs reviewed. Steady today 12. HTN: 144/86 on recheck this am. Observe for activity tolerance.  13. Urinary retention/BPH: continue Avodart.  ICP with high volumes, add flomax monitor BP tolerance  -no spont voids, flomax started 8/10 cont current dose, may need to increase next wk BP is tolerating 14. Adjustment disorder: Mentation improving gradually.Continue Xanax for chronic anxiety/ adjustment reaction.  15. Reactive leucocytosis: Likely due to steroids/surgery. No signs of infection on exam. Continue to monitor 16. Stress induced hyperglycemia: Hgb A1C- 5.9. Improved    LOS (Days) 11 A FACE TO FACE EVALUATION WAS PERFORMED  Charlett Blake 11/15/2014 9:48 AM

## 2014-11-16 ENCOUNTER — Inpatient Hospital Stay (HOSPITAL_COMMUNITY): Payer: PRIVATE HEALTH INSURANCE | Admitting: Occupational Therapy

## 2014-11-16 LAB — GLUCOSE, CAPILLARY
GLUCOSE-CAPILLARY: 93 mg/dL (ref 65–99)
GLUCOSE-CAPILLARY: 89 mg/dL (ref 65–99)
Glucose-Capillary: 132 mg/dL — ABNORMAL HIGH (ref 65–99)
Glucose-Capillary: 144 mg/dL — ABNORMAL HIGH (ref 65–99)

## 2014-11-16 NOTE — Progress Notes (Signed)
      Subjective/Complaints:  Patient has no new complaints. He is doing better, getting stronger   Objective: Vital Signs: Blood pressure 116/67, pulse 98, temperature 97.4 F (36.3 C), temperature source Oral, resp. rate 18, height 5\' 7"  (1.702 m), weight 127 lb 10.3 oz (57.9 kg), SpO2 96 %.  Patient appears older than his stated age. Muscles w/atrophy No acute distress. HEENT moist Chest clear to auscultation Cardiac exam  hyperdynamic with a 2/6 holosystolic murmur. Abdominal exam active bowel sounds, soft Extremities without edema, thin legs A/o/c  Assessment/Plan:  1. Functional deficits secondary to debility after acute respiratory failure and CHF exacerbation s/p CABG. Doing better  -has persistent LUE weakness  -follow up/work this up as an outpt after discharge  2. DVT Prophylaxis/Anticoagulation: Pharmaceutical: Lovenox due to VTE risk is required 3. Pain Management: oxycodone prn---observe activity tolerance with therapy 4. Mood: Increase Remeron to home dose to help manage insomnia and mood. Team to provide ego support. LCSW to follow for evaluation and support.  5. Neuropsych: This patient is capable of making decisions on his own behalf. 6. Skin/Wound Care: Routine pressure relief measures. Maintain adequate nutritional intake. Offer supplements between meals.  7. Fluids/Electrolytes/Nutrition: Monitor I/O. Daily labs to monitor metabolic/electrolyte status Basic Metabolic Panel:    Component Value Date/Time   NA 137 11/13/2014 0550   K 4.4 11/13/2014 0550   CL 97* 11/13/2014 0550   CO2 31 11/13/2014 0550   BUN 28* 11/13/2014 0550   CREATININE 1.16 11/13/2014 0550   GLUCOSE 100* 11/13/2014 0550   CALCIUM 9.3 11/13/2014 0550   8. CAD s/p CABG: Continue sternal precautions. Continues to have incisional pain but no CP. On ASA, lipitor, coreg and Entresto.  9. Acute on systolic CHF: Monitor daily weights. Low salt diet.   aldactone, coreg, Entestor and  digoxin.  -lasix   - Cardiology following.    10 COPD: Avoiding Spiriva with BPH/voiding difficulties. Continue Brovan/pulmicort with prn Xopenex. To be discharged on symbicort 80/4.5 2 puffs bid + prn xopenex  -outpt follow up with pulmonary at dc  -lungs remain generally clear but with reduced air flow. Continue to work on activity tolerance  -continue oxygen as needed. Consider tapering oxygen next week if possible 11. ABLA: Slowly improving. Labs reviewed. Steady today CBC:    Component Value Date/Time   WBC 12.1* 11/06/2014 0535   HGB 10.5* 11/06/2014 0535   HCT 33.2* 11/06/2014 0535   PLT 430* 11/06/2014 0535   MCV 109.2* 11/06/2014 0535   NEUTROABS 7.8* 11/05/2014 0558   LYMPHSABS 1.4 11/05/2014 0558   MONOABS 1.9* 11/05/2014 0558   EOSABS 0.3 11/05/2014 0558   BASOSABS 0.1 11/05/2014 0558     12. HTN: asymptomatic at present. Observe for activity tolerance. 110/70-155/81 13. Urinary retention/BPH: continue Avodart. Would like to remove foley over next few days potentially  -ua unremarkable , ucx negative  14. Adjustment disorder: Mentation improving gradually. Continue Xanax for chronic anxiety/ adjustment reaction.  15. Reactive leucocytosis: Likely due to steroids/surgery.  16. Stress induced hyperglycemia: Hgb A1C- 5.9. Will continue SSI with aggressive BS monitoring to help promote healing past CABG.  CBG (last 3)   Recent Labs  11/15/14 2128 11/16/14 0718 11/16/14 1142  GLUCAP 105* 93 132*    Cont current Rx  LOS (Days) 12 A FACE TO FACE EVALUATION WAS PERFORMED  Alex Plotnikov 11/16/2014 3:27 PM

## 2014-11-16 NOTE — Progress Notes (Signed)
Occupational Therapy Session Note  Patient Details  Name: Travis Romero MRN: [id number] Date of Birth: 14-Nov-1950  Today's Date: 11/16/2014 OT Individual Time: 0900-1000 OT Individual Time Calculation (min): 60 min    Short Term Goals: Week 2:  OT Short Term Goal 1 (Week 2): Pt will complete LB dressing with mod A sit<>stand to increase functional independence.  OT Short Term Goal 2 (Week 2): Pt will complete UB dressing with supervision OT Short Term Goal 3 (Week 2): Pt will complete bathing tasks with min A OT Short Term Goal 4 (Week 2): Pt will perform toilet transfers with mod A  Skilled Therapeutic Interventions/Progress Updates: patient completed bathing and dressing of upper body and buttocks and peri area.  He stated the nurse helps me wash my feet at night.   Skilled focus on session was left hand use - including extending digits for more complete functional use; dynamic standing balance and endurance for washing buttocks and periarea and pull up pants.   Patient required extra time in order to utilize work simplication and energy conservation and overall decreased endurance and in order to not exacerabte COPD.     Therapy Documentation Precautions:  Precautions Precautions: Fall, Sternal Precaution Comments: watch HR  Restrictions Weight Bearing Restrictions: No (Sternal precautions)   Pain:7/10 "ache all over & I got pains meds at 8:30 - hour ago"     See FIM for current functional status  Therapy/Group: Individual Therapy  Alfredia Ferguson [facility] 11/16/2014, 9:55 AM

## 2014-11-17 ENCOUNTER — Inpatient Hospital Stay (HOSPITAL_COMMUNITY): Payer: PRIVATE HEALTH INSURANCE | Admitting: Physical Therapy

## 2014-11-17 DIAGNOSIS — J418 Mixed simple and mucopurulent chronic bronchitis: Secondary | ICD-10-CM

## 2014-11-17 LAB — GLUCOSE, CAPILLARY
GLUCOSE-CAPILLARY: 114 mg/dL — AB (ref 65–99)
GLUCOSE-CAPILLARY: 110 mg/dL — AB (ref 65–99)
Glucose-Capillary: 123 mg/dL — ABNORMAL HIGH (ref 65–99)
Glucose-Capillary: 125 mg/dL — ABNORMAL HIGH (ref 65–99)

## 2014-11-17 NOTE — Progress Notes (Signed)
Physical Therapy Session Note  Patient Details  Name: Lynkin Saini MRN: [id number] Date of Birth: 12-29-1950  Today's Date: 11/17/2014 PT Individual Time: 1300-1345 PT Individual Time Calculation (min): 45 min   Short Term Goals: Week 1:  PT Short Term Goal 1 (Week 1): pt will perform standing activity for 5 min with min assist PT Short Term Goal 1 - Progress (Week 1): Met PT Short Term Goal 2 (Week 1): pt will ambulate with no AD 25 feet with min assist PT Short Term Goal 2 - Progress (Week 1): Met PT Short Term Goal 3 (Week 1): pt will perform bed mobillity with supervision PT Short Term Goal 3 - Progress (Week 1): Met PT Short Term Goal 4 (Week 1): pt will negotiate two six inch steps using fingertip support on rails with min assist PT Short Term Goal 4 - Progress (Week 1): Met PT Short Term Goal 5 (Week 1): pt will perfrom stand pivot transfer with min assist PT Short Term Goal 5 - Progress (Week 1): Met Week 2:  PT Short Term Goal 1 (Week 2): pt will perform standing activity for 10 min with min assist for balance PT Short Term Goal 2 (Week 2): pt will ambulate least restricitive AD X 175 ft with min assist PT Short Term Goal 3 (Week 2): pt will perform stand pivot transfer with no AD with supervision PT Short Term Goal 4 (Week 2): pt will negotiate 14 six inch steps with supervision and fingertip support bilat UE  Skilled Therapeutic Interventions/Progress Updates:   Pt limited in session by toileting needs. Pt with increased difficulty transferring from standard chair without arm rests. Pt does best with UE support during transfers. Pt would continue to benefit from skilled PT services to increase functional mobility.  Therapy Documentation Precautions:  Precautions Precautions: Fall, Sternal Precaution Comments: watch HR  Restrictions Weight Bearing Restrictions: No (Sternal precautions) Vital Signs: Therapy Vitals Temp: 97.4 F (36.3 C) Temp Source: Oral Pulse  Rate: (!) 102 Resp: 16 BP: 138/83 mmHg Patient Position (if appropriate): Sitting Oxygen Therapy SpO2: 94 % O2 Device: Not Delivered Pain: Pain Assessment Pain Assessment: No/denies pain Pain Score: 8  Mobility:  Transfers Min A with cues for hand placement, safety, and technique Locomotion : Ambulation Ambulation/Gait Assistance: 5: Supervision  Other Treatments:  Pt educated on rehab plan, safety in mobility, and progressing activity. Pt performs static standing 2'x3. Pt performs transfers x12 in session. Pt performs dynamic standing balance tasks with dual motor tasks including weight shifting, reaching, grasping, and LE manipulation within functional limits.   See FIM for current functional status  Therapy/Group: Individual Therapy  Monia Pouch 11/17/2014, 3:11 PM

## 2014-11-17 NOTE — Progress Notes (Signed)
      Subjective/Complaints:  Patient has no new complaints. He is getting stronger   Objective: Vital Signs: Blood pressure 152/82, pulse 107, temperature 97.8 F (36.6 C), temperature source Oral, resp. rate 16, height 5\' 7"  (1.702 m), weight 130 lb 8.2 oz (59.2 kg), SpO2 90 %.  Patient appears older than his stated age. Muscles w/atrophy No acute distress. HEENT moist Chest clear to auscultation Cardiac exam  hyperdynamic with a 2/6 holosystolic murmur. Abdominal exam active bowel sounds, soft Extremities without edema, thin legs, less weak A/o/c  Assessment/Plan:  1. Functional deficits secondary to debility after acute respiratory failure and CHF exacerbation s/p CABG. Doing better  -has persistent LUE weakness  -follow up/work this up as an outpt after discharge  2. DVT Prophylaxis/Anticoagulation: Pharmaceutical: Lovenox due to VTE risk is required 3. Pain Management: oxycodone prn---observe activity tolerance with therapy 4. Mood: Increase Remeron to home dose to help manage insomnia and mood. Team to provide ego support. LCSW to follow for evaluation and support.  5. Neuropsych: This patient is capable of making decisions on his own behalf. 6. Skin/Wound Care: Routine pressure relief measures. Maintain adequate nutritional intake. Offer supplements between meals.  7. Fluids/Electrolytes/Nutrition: Monitor I/O. Daily labs to monitor metabolic/electrolyte status   Basic Metabolic Panel:    Component Value Date/Time   NA 137 11/13/2014 0550   K 4.4 11/13/2014 0550   CL 97* 11/13/2014 0550   CO2 31 11/13/2014 0550   BUN 28* 11/13/2014 0550   CREATININE 1.16 11/13/2014 0550   GLUCOSE 100* 11/13/2014 0550   CALCIUM 9.3 11/13/2014 0550   8. CAD s/p CABG: Continue sternal precautions. Continues to have incisional pain but no CP. On ASA, lipitor, coreg and Entresto.  9. Acute on systolic CHF: Monitor daily weights. Low salt diet.   aldactone, coreg, Entestor and  digoxin.  -lasix   - Cardiology following.    10 COPD: Avoiding Spiriva with BPH/voiding difficulties. Continue Brovan/pulmicort with prn Xopenex. To be discharged on symbicort 80/4.5 2 puffs bid + prn xopenex  -outpt follow up with pulmonary at dc  -lungs remain generally clear but with reduced air flow. Continue to work on activity tolerance  -continue oxygen as needed. Consider tapering oxygen next week if possible 11. ABLA: Slowly improving. Labs reviewed. Steady today CBC:    Component Value Date/Time   WBC 12.1* 11/06/2014 0535   HGB 10.5* 11/06/2014 0535   HCT 33.2* 11/06/2014 0535   PLT 430* 11/06/2014 0535   MCV 109.2* 11/06/2014 0535   NEUTROABS 7.8* 11/05/2014 0558   LYMPHSABS 1.4 11/05/2014 0558   MONOABS 1.9* 11/05/2014 0558   EOSABS 0.3 11/05/2014 0558   BASOSABS 0.1 11/05/2014 0558     12. HTN: asymptomatic at present. Observe for activity tolerance. 110/70-155/81 13. Urinary retention/BPH: continue Avodart. Would like to remove foley over next few days potentially  -ua unremarkable , ucx negative  14. Adjustment disorder: Mentation improving gradually. Continue Xanax for chronic anxiety/ adjustment reaction.  15. Reactive leucocytosis: Likely due to steroids/surgery.  16. Stress induced hyperglycemia: Hgb A1C- 5.9. Will continue SSI with aggressive BS monitoring to help promote healing past CABG.  CBG (last 3)   Recent Labs  11/16/14 2048 11/17/14 0612 11/17/14 1135  GLUCAP 144* 114* 123*    Cont Rx  LOS (Days) 13 A FACE TO FACE EVALUATION WAS PERFORMED  Alex Plotnikov 11/17/2014 12:35 PM

## 2014-11-18 ENCOUNTER — Inpatient Hospital Stay (HOSPITAL_COMMUNITY): Payer: PRIVATE HEALTH INSURANCE | Admitting: Occupational Therapy

## 2014-11-18 ENCOUNTER — Inpatient Hospital Stay (HOSPITAL_COMMUNITY): Payer: PRIVATE HEALTH INSURANCE | Admitting: Physical Therapy

## 2014-11-18 ENCOUNTER — Inpatient Hospital Stay (HOSPITAL_COMMUNITY): Payer: PRIVATE HEALTH INSURANCE | Admitting: Speech Pathology

## 2014-11-18 ENCOUNTER — Inpatient Hospital Stay (HOSPITAL_COMMUNITY): Payer: PRIVATE HEALTH INSURANCE

## 2014-11-18 LAB — BASIC METABOLIC PANEL WITH GFR
Anion gap: 9 (ref 5–15)
BUN: 28 mg/dL — ABNORMAL HIGH (ref 6–20)
CO2: 28 mmol/L (ref 22–32)
Calcium: 9 mg/dL (ref 8.9–10.3)
Chloride: 97 mmol/L — ABNORMAL LOW (ref 101–111)
Creatinine, Ser: 1.21 mg/dL (ref 0.61–1.24)
GFR calc Af Amer: >60 mL/min
GFR calc non Af Amer: >60 mL/min
Glucose, Bld: 86 mg/dL (ref 65–99)
Potassium: 4.4 mmol/L (ref 3.5–5.1)
Sodium: 134 mmol/L — ABNORMAL LOW (ref 135–145)

## 2014-11-18 LAB — GLUCOSE, CAPILLARY
GLUCOSE-CAPILLARY: 112 mg/dL — AB (ref 65–99)
GLUCOSE-CAPILLARY: 134 mg/dL — AB (ref 65–99)
Glucose-Capillary: 95 mg/dL (ref 65–99)
Glucose-Capillary: 137 mg/dL — ABNORMAL HIGH (ref 65–99)

## 2014-11-18 MED ORDER — CARVEDILOL 12.5 MG PO TABS
12.5000 mg | ORAL_TABLET | Freq: Two times a day (BID) | ORAL | Status: DC
  Administered 2014-11-18 – 2014-11-26 (×17): 12.5 mg via ORAL
  Filled 2014-11-18 (×16): qty 1

## 2014-11-18 NOTE — Progress Notes (Signed)
[facility] PHYSICAL MEDICINE & REHABILITATION     PROGRESS NOTE    Subjective/Complaints: No problems. Feels he's stronger.  ROS:  nausea, vomiting, abdominal pain, diarrhea, chest pain,  palpitations, no neck pain which radiates to arms   Objective: Vital Signs: Blood pressure 152/72, pulse 108, temperature 97.8 F (36.6 C), temperature source Oral, resp. rate 16, height 5\' 7"  (1.702 m), weight 58.8 kg (129 lb 10.1 oz), SpO2 97 %. No results found. No results for input(s): WBC, HGB, HCT, PLT in the last 72 hours.  Recent Labs  11/18/14 0619  NA 134*  K 4.4  CL 97*  GLUCOSE 86  BUN 28*  CREATININE 1.21  CALCIUM 9.0   CBG (last 3)   Recent Labs  11/17/14 1632 11/17/14 2051 11/18/14 0646  GLUCAP 125* 110* 95    Wt Readings from Last 3 Encounters:  11/18/14 58.8 kg (129 lb 10.1 oz)  11/04/14 56.2 kg (123 lb 14.4 oz)  10/07/14 68.493 kg (151 lb)    Physical Exam:  Constitutional: He is oriented to person, place, and time. He appears well-developed and well-nourished.  Frail appearing male  HENT: oral mucosa a little dry. Voice stronger Head: Normocephalic and atraumatic.  Eyes: Conjunctivae are normal. Pupils are equal, round, and reactive to light.  Neck: Normal range of motion. Neck supple. Tenderness over upper traps Cardiovascular: Normal rate and regular rhythm. no murmurs, rubs, gallops Respiratory: Effort normal. Equal BS. He has no wheezes. No distress GI: Soft. Bowel sounds are normal. He exhibits no distension. There is no tenderness.  Musculoskeletal: He exhibits trace tibial edema.Marland Kitchen He exhibits no tenderness.  Neurological: He is alert and oriented to person, place, and time. Reasonable insight and awareness.  Dysphonic speech mild. Able to follow one and two step commands without difficulty.  UE: 4- deltoid, 4 bicep,right  tricep, wrist, hi LUE: tricep   4-, wrist 4-, HI 3, 4th/5th finger drop. LE: 3 hf, 3+ ke and 4 adf/apf  Skin: Skin  is warm and dry.  Midline sternal incision CDI, mild tenderness Small scabbed abrasion below left knee laterally.chest wall incision intact  Psychiatric: Thought content generally normal. Pleasant mood.   Assessment/Plan: 1. Functional deficits secondary to debility after CABG and multiple medical  which require 3+ hours per day of interdisciplinary therapy in a comprehensive inpatient rehab setting. Physiatrist is providing close team supervision and 24 hour management of active medical problems listed below. Physiatrist and rehab team continue to assess barriers to discharge/monitor patient progress toward functional and medical goals. FIM: FIM - Bathing Bathing Steps Patient Completed: Chest, Right Arm, Left Arm, Abdomen, Front perineal area, Buttocks, Right upper leg, Left upper leg Bathing: 4: Min-Patient completes 8-9 69f 10 parts or 75+ percent  FIM - Upper Body Dressing/Undressing Upper body dressing/undressing steps patient completed: Thread/unthread right sleeve of front closure shirt/dress, Thread/unthread left sleeve of front closure shirt/dress, Pull shirt around back of front closure shirt/dress Upper body dressing/undressing: 4: Min-Patient completed 75 plus % of tasks FIM - Lower Body Dressing/Undressing Lower body dressing/undressing steps patient completed: Thread/unthread right pants leg, Pull pants up/down Lower body dressing/undressing: 0: Activity did not occur  FIM - Toileting Toileting: 0: Activity did not occur  FIM - Air cabin crew Transfers: 0-Activity did not occur  FIM - Control and instrumentation engineer Devices: Copy: 5: Supine > Sit: Supervision (verbal cues/safety issues), 5: Bed > Chair or W/C: Supervision (verbal cues/safety issues)  FIM - Locomotion: Wheelchair Distance: 100 Locomotion:  Wheelchair: 0: Activity did not occur FIM - Locomotion: Ambulation Locomotion: Ambulation Assistive Devices: Astronomer Ambulation/Gait Assistance: 5: Supervision Locomotion: Ambulation: 5: Travels 150 ft or more with supervision/safety issues  Comprehension Comprehension Mode: Auditory Comprehension: 5-Follows basic conversation/direction: With extra time/assistive device  Expression Expression Mode: Verbal Expression: 5-Expresses basic needs/ideas: With no assist  Social Interaction Social Interaction: 4-Interacts appropriately 75 - 89% of the time - Needs redirection for appropriate language or to initiate interaction.  Problem Solving Problem Solving: 4-Solves basic 75 - 89% of the time/requires cueing 10 - 24% of the time  Memory Memory Assistive Devices: Memory book Memory: 4-Recognizes or recalls 75 - 89% of the time/requires cueing 10 - 24% of the time  Medical Problem List and Plan: 1. Functional deficits secondary to debility after acute respiratory failure and CHF exacerbation s/p CABG.  -  -weaning O2 to keep sats >90% 2. DVT Prophylaxis/Anticoagulation: Pharmaceutical: Lovenox due to VTE risk is required 3. Pain Management: oxycodone prn---observe activity tolerance with therapy 4. Mood: Increase Remeron to home dose to help manage insomnia and mood. Team to provide ego support. LCSW to follow for evaluation and support.  5. Neuropsych: This patient is capable of making decisions on his own behalf. 6. Skin/Wound Care: Routine pressure relief measures. Maintain adequate nutritional intake. Offer supplements between meals.  7. Fluids/Electrolytes/Nutrition: Monitor I/O. Daily labs to monitor metabolic/electrolyte status, ~1057ml fluid on 8/11.  100% meals 8. CAD s/p CABG: Continue sternal precautions. Continues to have incisional pain but no CP. On ASA, lipitor, coreg and Entresto.  9. Acute on systolic CHF: Monitor daily weights. Low salt diet.   aldactone, coreg, Entestor and digoxin.  -lasix 40mg   - Cardiology following also.no recent med changes   -all labs personally  reviewed/stable. Weight around 59kg 10 COPD: Avoiding Spiriva with BPH/voiding difficulties. Continue Brovan/pulmicort with prn Xopenex. To be discharged on symbicort 80/4.5 2 puffs bid + prn xopenex  -outpt follow up with pulmonary at dc  -lungs remain generally clear--airflow a little better.    11. ABLA: Slowly improving.  12. DSK:AJGOTLXBW improved. Watch sbp  13. Urinary retention/BPH: continue Avodart.  ICP with high volumes, add flomax monitor BP tolerance  -no spont voids, flomax started 8/10 cont current dose, may need to increase next wk BP is tolerating 14. Adjustment disorder: Mentation improving gradually.Continue Xanax for chronic anxiety/ adjustment reaction.  15. Reactive leucocytosis: Likely due to steroids/surgery. No signs of infection on exam. Continue to monitor 16. Stress induced hyperglycemia: Hgb A1C- 5.9. Improved    LOS (Days) 14 A FACE TO FACE EVALUATION WAS PERFORMED  Seanpatrick Maisano T 11/18/2014 8:29 AM

## 2014-11-18 NOTE — Progress Notes (Signed)
Nutrition Follow-up  DOCUMENTATION CODES:   Severe malnutrition in context of chronic illness  INTERVENTION:   Continue 30 ml Prostat po BID, each dose provides 100 kcal and 15 grams of protein.   Provide nourishment snacks. (Ordered).  Encourage adequate PO intake.   NUTRITION DIAGNOSIS:   Malnutrition related to chronic illness as evidenced by severe depletion of body fat, severe depletion of muscle mass; ongoing  GOAL:   Patient will meet greater than or equal to 90% of their needs; met  MONITOR:   PO intake, Supplement acceptance, Weight trends, Labs, I & O's  REASON FOR ASSESSMENT:   Consult  (snacks)  ASSESSMENT:   64 y.o. male with history of HTN, COPD, tobacco abuse who was admitted on 10/11/14 with progressive SOB due to acute on chronic respiratory failure and was intubation at admission. He was found to have acute systolic CHF requiring aggressive diuresis and 2 D echo done revealing EF 20-25% with mild MR. Cardiac cath done on 07/14 revealed severe 3V CAD with severe LV dysfunction and patient underwent CABG X # on 07/21  Intake has been adequate with meal completion 75-100%. Pt has been consuming his Prostat. RD to continue with current orders.  Labs and medications reviewed.  Diet Order:  Diet Heart Room service appropriate?: Yes; Fluid consistency:: Thin  Skin:   (Incision on mid sternum and R thigh)  Last BM:  8/14  Height:   Ht Readings from Last 1 Encounters:  11/04/14 5' 7"  (1.702 m)    Weight:   Wt Readings from Last 1 Encounters:  11/18/14 129 lb 10.1 oz (58.8 kg)    Ideal Body Weight:  67.3 kg  BMI:  Body mass index is 20.3 kg/(m^2).  Estimated Nutritional Needs:   Kcal:  1800-2000  Protein:  80-95 grams  Fluid:  1.8 - 1.9 L/day  EDUCATION NEEDS:   No education needs identified at this time  Corrin Parker, MS, RD, LDN Pager # [phone] After hours/ weekend pager # [phone]

## 2014-11-18 NOTE — Progress Notes (Signed)
Speech Language Pathology Daily Session Note  Patient Details  Name: Naveen Clardy MRN: [id number] Date of Birth: [birth year]  Today's Date: 11/18/2014 SLP Individual Time: 0900-1000 SLP Individual Time Calculation (min): 60 min  Short Term Goals: Week 2: SLP Short Term Goal 1 (Week 2): Patient will utilize external memory aids to recall new, daily information with Min A multimodal cues. SLP Short Term Goal 2 (Week 2): Patient will demonstrate sustained attention to a functional task for 60 minutes with Min verbal cues for redirection.  SLP Short Term Goal 3 (Week 2): Patient will self-monitor and correct errors with functional tasks with Min verbal and question cues.  SLP Short Term Goal 4 (Week 2): Patient will utilize an increased vocal intensity at the phrase level with supervision verbal cues to increase intelligibility to 100%.  SLP Short Term Goal 5 (Week 2): Patient will demonstrate functional problem for basic and familiar tasks with Min A multimodal cues.   Skilled Therapeutic Interventions: Skilled treatment session focused on cognitive goals. Patient utilized external memory aids with Mod I for recall of events from morning therapy sessions and for recall of the time his PRN medications were administered.  Patient participated in a mildly complex, new learning task and required Min A question cues for recall of rules, problem solving and organization with task. Patient also wrote the rules of the task down with supervision question cues to increase recall. Patient left upright in wheelchair with quick release belt in place and all needs within reach. Continue with current plan of care.    FIM:  Comprehension Comprehension Mode: Auditory Comprehension: 5-Follows basic conversation/direction: With no assist Expression Expression Mode: Verbal Expression: 5-Expresses basic needs/ideas: With extra time/assistive device Social Interaction Social Interaction: 5-Interacts  appropriately 90% of the time - Needs monitoring or encouragement for participation or interaction. Problem Solving Problem Solving: 4-Solves basic 75 - 89% of the time/requires cueing 10 - 24% of the time Memory Memory: 4-Recognizes or recalls 75 - 89% of the time/requires cueing 10 - 24% of the time FIM - Eating Eating Activity: 5: Set-up assist for open containers  Pain No reports of pain throughout session   Therapy/Group: Individual Therapy  Amberley Hamler 11/18/2014, 12:35 PM

## 2014-11-18 NOTE — Progress Notes (Signed)
Advanced Heart Failure Rounding Note   Subjective:    In rehab.Getting stronger. No CP or SOB. Weight and labs stable    Objective:   Weight Range:  Vital Signs:   Temp:  [97.4 F (36.3 C)-97.8 F (36.6 C)] 97.8 F (36.6 C) (08/15 0640) Pulse Rate:  [84-102] 84 (08/15 0640) Resp:  [16] 16 (08/15 0640) BP: (132-146)/(74-83) 132/74 mmHg (08/15 0640) SpO2:  [90 %-97 %] 97 % (08/15 0640) Weight:  [58.8 kg (129 lb 10.1 oz)] 58.8 kg (129 lb 10.1 oz) (08/15 0640) Last BM Date: 11/17/14  Weight change: Filed Weights   11/16/14 0716 11/17/14 0636 11/18/14 0640  Weight: 57.9 kg (127 lb 10.3 oz) 59.2 kg (130 lb 8.2 oz) 58.8 kg (129 lb 10.1 oz)    Intake/Output:   Intake/Output Summary (Last 24 hours) at 11/18/14 0749 Last data filed at 11/18/14 0600  Gross per 24 hour  Intake   1440 ml  Output   2275 ml  Net   -835 ml     Physical Exam: General: . No resp difficulty. Sitting in the chair eating. Hoarse voice. HEENT: normal Neck: supple. JVP 7. Carotids 2+ bilat; no bruits. No lymphadenopathy or thryomegaly appreciated. Cor: Sternal wound ok. Regular rate & rhythm.  Lungs: decreased BS throughout Abdomen: soft, nontender, nondistended. No hepatosplenomegaly. No bruits or masses. Good bowel sounds. Extremities: no cyanosis, clubbing, rash. Chronic venous stasis changes with tr-1+ edema. RUE PICC Neuro: alert & orientedx3, cranial nerves grossly intact. moves all 4 extremities w/o difficulty. Affect pleasant     Labs: Basic Metabolic Panel:  Recent Labs Lab 11/12/14 0550 11/13/14 0550 11/18/14 0619  NA 136 137 134*  K 4.5 4.4 4.4  CL 97* 97* 97*  CO2 33* 31 28  GLUCOSE 90 100* 86  BUN 29* 28* 28*  CREATININE 1.10 1.16 1.21  CALCIUM 9.0 9.3 9.0    Liver Function Tests: No results for input(s): AST, ALT, ALKPHOS, BILITOT, PROT, ALBUMIN in the last 168 hours. No results for input(s): LIPASE, AMYLASE in the last 168 hours. No results for input(s): AMMONIA in the  last 168 hours.  CBC: No results for input(s): WBC, NEUTROABS, HGB, HCT, MCV, PLT in the last 168 hours.  Cardiac Enzymes: No results for input(s): CKTOTAL, CKMB, CKMBINDEX, TROPONINI in the last 168 hours.  BNP: BNP (last 3 results)  Recent Labs  10/12/14 1337 10/29/14 0812 11/03/14 0358  BNP 981.7* 1031.0* 699.7*    ProBNP (last 3 results) No results for input(s): PROBNP in the last 8760 hours.    Other results:  Imaging: No results found.   Medications:     Scheduled Medications: . arformoterol  15 mcg Nebulization BID  . aspirin EC  325 mg Oral Daily   Or  . aspirin  324 mg Per Tube Daily  . atorvastatin  80 mg Oral q1800  . budesonide (PULMICORT) nebulizer solution  0.5 mg Nebulization BID  . carvedilol  12.5 mg Oral BID WC  . digoxin  0.125 mg Oral Daily  . dutasteride  0.5 mg Oral Daily  . enoxaparin (LOVENOX) injection  40 mg Subcutaneous Q24H  . feeding supplement (PRO-STAT SUGAR FREE 64)  30 mL Oral BID  . furosemide  40 mg Oral Daily  . hydrocerin   Topical BID  . insulin aspart  0-9 Units Subcutaneous TID WC  . mirtazapine  45 mg Oral QHS  . nicotine  21 mg Transdermal Daily  . pantoprazole  40 mg Oral Daily  .  sacubitril-valsartan  1 tablet Oral BID  . senna-docusate  2 tablet Oral QHS  . sodium chloride  10-40 mL Intracatheter Q12H  . spironolactone  12.5 mg Oral Daily  . tamsulosin  0.8 mg Oral QPC supper    Infusions:    PRN Medications: acetaminophen, ALPRAZolam, alum & mag hydroxide-simeth, benzonatate, bisacodyl, guaiFENesin-dextromethorphan, levalbuterol, lidocaine, methocarbamol, MUSCLE RUB, ondansetron **OR** ondansetron (ZOFRAN) IV, oxyCODONE, sodium chloride, traMADol, traZODone   Assessment:   1. ICM---> CABG x3 7/20 IMA-LAD;SVG to OM,SVG to Distal RCA (N/A). On statin, bb, aspirin  2. Acute Systolic Heart Failure- ECHO 10/12/14 EF 20% ICM per [facility]  3. Physical deconditioning 4. Severe protein calorie malnutrition  5.  Hoarseness 6. HTN   Plan/Discussion:     HF stable. Labs and weight stable. Will increase carvedilol to 12.5 bid.  Length of Stay: 14   Glori Bickers  MD  11/18/2014, 7:49 AM  Advanced Heart Failure Team Pager [phone] (M-F; [place])  Please contact [facility] for night-coverage after hours (4p -7a ) and weekends on [email or web address]

## 2014-11-18 NOTE — Progress Notes (Signed)
Occupational Therapy Weekly Progress Note  Patient Details  Name: Travis Romero MRN: [id number] Date of Birth: September 23, 1950  Beginning of progress report period: November 12, 2014 End of progress report period: November 18, 2014  Patient has met 3 of 4 short term goals.  Pt is making slow but steady progress with BADLs.  Pt continues to require more than a reasonable amount of time to initiate and complete BADLs with multiple rest breaks.  Pt continues to require assistance with LB dressing tasks and bathing tasks.  Pt attempts to use left hand to assist with functional tasks but continues to exhibit deficits with gross grasp and [place].   Patient continues to demonstrate the following deficits: impaired memory, attention, awareness, impaired activity tolerance, impaired standing balance, and impaired LUE strength and therefore will continue to benefit from skilled OT intervention to enhance overall performance with BADL and Reduce care partner burden.  Patient progressing toward long term goals..  Continue plan of care.  OT Short Term Goals Week 2:  OT Short Term Goal 1 (Week 2): Pt will complete LB dressing with mod A sit<>stand to increase functional independence.  OT Short Term Goal 2 (Week 2): Pt will complete UB dressing with supervision OT Short Term Goal 2 - Progress (Week 2): Progressing toward goal OT Short Term Goal 3 (Week 2): Pt will complete bathing tasks with min A OT Short Term Goal 3 - Progress (Week 2): Met OT Short Term Goal 4 (Week 2): Pt will perform toilet transfers with mod A OT Short Term Goal 4 - Progress (Week 2): Met Week 3:  OT Short Term Goal 1 (Week 3): STG=LTG due to ELOS      Therapy Documentation Precautions:  Precautions Precautions: Fall, Sternal Precaution Comments: watch HR  Restrictions Weight Bearing Restrictions: No (Sternal precautions)  See FIM for current functional status   [name] 11/18/2014, 2:56 PM

## 2014-11-18 NOTE — Progress Notes (Signed)
Occupational Therapy Session Note  Patient Details  Name: Almus Woodham MRN: [id number] Date of Birth: 1950-09-14  Today's Date: 11/18/2014 OT Individual Time: 0700-0800 OT Individual Time Calculation (min): 60 min    Short Term Goals: Week 2:  OT Short Term Goal 1 (Week 2): Pt will complete LB dressing with mod A sit<>stand to increase functional independence.  OT Short Term Goal 2 (Week 2): Pt will complete UB dressing with supervision OT Short Term Goal 3 (Week 2): Pt will complete bathing tasks with min A OT Short Term Goal 4 (Week 2): Pt will perform toilet transfers with mod A  Skilled Therapeutic Interventions/Progress Updates:    Pt resting in bed upon arrival and agreeable to therapy this morning.  Pt engaged in BADL retraining including bathing and dressing with sit<>stand from w/c at sink.  Pt continues to require assistance with fastening buttons on shirt and LB dressing tasks.  Pt performs sit<>stand with supervision and stands at sink to doff/donn shirt and attempt to button shirt.  Pt performs transfers with steady A. Pt continues to require more than a reasonable amount of time to initiate and complete tasks with multiple rest breaks.  Focus on bed mobility, activity tolerance, sit<>stand, functional transfers, standing balance, and safety awareness.  Therapy Documentation Precautions:  Precautions Precautions: Fall, Sternal Precaution Comments: watch HR  Restrictions Weight Bearing Restrictions: No (Sternal precautions) Pain: Pain Assessment Pain Assessment: No/denies pain  See FIM for current functional status  Therapy/Group: Individual Therapy  [name] 11/18/2014, 8:00 AM

## 2014-11-18 NOTE — Progress Notes (Signed)
Occupational Therapy Session Note  Patient Details  Name: Travis Romero MRN: [id number] Date of Birth: 11/14/1950  Today's Date: 11/18/2014 OT Individual Time: 1030-1100 OT Individual Time Calculation (min): 30 min    Short Term Goals: Week 2:  OT Short Term Goal 1 (Week 2): Pt will complete LB dressing with mod A sit<>stand to increase functional independence.  OT Short Term Goal 2 (Week 2): Pt will complete UB dressing with supervision OT Short Term Goal 3 (Week 2): Pt will complete bathing tasks with min A OT Short Term Goal 4 (Week 2): Pt will perform toilet transfers with mod A  Skilled Therapeutic Interventions/Progress Updates:    Pt discussed his goals and his frustration with his L hand coordination and difficulty tying shoes. Pt worked on doffing non slip socks, donning shoes and tying shoes with extra time. He was surprised that he was able to tie his shoes although he did need extra time. Theraputty exercises to develop coordination and strength of L finger tip pinch. Reviewed memory strategies and discussed his progress so far and his rehab goals to prepare for home.  Pt in room with quick release belt and all needs met.  Therapy Documentation Precautions:  Precautions Precautions: Fall, Sternal Precaution Comments: watch HR  Restrictions Weight Bearing Restrictions: No (Sternal precautions)    Vital Signs: Oxygen Therapy SpO2: 96 % O2 Device: Not Delivered Pain: Pain Assessment Pain Assessment: No/denies pain Pain Score: 6  ADL:  See FIM for current functional status  Therapy/Group: Individual Therapy  SAGUIER,JULIA 11/18/2014, 12:39 PM  

## 2014-11-18 NOTE — Progress Notes (Signed)
Physical Therapy Session Note  Patient Details  Name: Travis Romero MRN: [id number] Date of Birth: [birth year]  Today's Date: 11/18/2014 PT Individual Time: 1300-1400 PT Individual Time Calculation (min): 60 min   Short Term Goals: Week 2:  PT Short Term Goal 1 (Week 2): pt will perform standing activity for 10 min with min assist for balance PT Short Term Goal 2 (Week 2): pt will ambulate least restricitive AD X 175 ft with min assist PT Short Term Goal 3 (Week 2): pt will perform stand pivot transfer with no AD with supervision PT Short Term Goal 4 (Week 2): pt will negotiate 14 six inch steps with supervision and fingertip support bilat UE  Skilled Therapeutic Interventions/Progress Updates:    Pt received in w/c with QR belt intact; no c/o pain and agreeable to treatment. Pt reports he would like to work on stairs to better prepare for d/c home. Pt taken to gym in w/c with totalA for energy conservation. Stair training on 6-inch stairs, 2 trials of 14 stairs each trial with ascent/descent, 2 handrails and supervision. Pt very fatigued after each trial and requires extended seated rest breaks. During one break pt requests information regarding doctors note for missing work; will inform Education officer, museum. Gait training with RW and supervision for 2 trials of approximately 250' each. Performed with very slow speed, cues for upright posture and increased gait speed. Pt also fatigues after each gait session requiring rest breaks. Nustep x10 min on level 2 with encouraging pt to remain above 30 steps/min to improve aerobic endurance and cardiovascular fitness, as well as LE/UE strength and coordination for carryover into gait for home and community distances. Pt returned to room in w/c with totalA and remained seated with QR belt intact at completion of session.  Therapy Documentation Precautions:  Precautions Precautions: Fall, Sternal Precaution Comments: watch HR  Restrictions Weight Bearing  Restrictions: No (Sternal precautions) Pain: Pain Assessment Pain Assessment: No/denies pain Pain Score: 0-No pain   See FIM for current functional status  Therapy/Group: Individual Therapy  Luberta Mutter 11/18/2014, 2:08 PM

## 2014-11-19 ENCOUNTER — Inpatient Hospital Stay (HOSPITAL_COMMUNITY): Payer: PRIVATE HEALTH INSURANCE | Admitting: Physical Therapy

## 2014-11-19 ENCOUNTER — Inpatient Hospital Stay (HOSPITAL_COMMUNITY): Payer: PRIVATE HEALTH INSURANCE | Admitting: Occupational Therapy

## 2014-11-19 ENCOUNTER — Inpatient Hospital Stay (HOSPITAL_COMMUNITY): Payer: PRIVATE HEALTH INSURANCE | Admitting: Speech Pathology

## 2014-11-19 ENCOUNTER — Inpatient Hospital Stay (HOSPITAL_COMMUNITY): Payer: PRIVATE HEALTH INSURANCE

## 2014-11-19 LAB — URINALYSIS, ROUTINE W REFLEX MICROSCOPIC
BILIRUBIN URINE: NEGATIVE
Glucose, UA: NEGATIVE mg/dL
KETONES UR: NEGATIVE mg/dL
NITRITE: POSITIVE — AB
PROTEIN: 100 mg/dL — AB
Specific Gravity, Urine: 1.012 (ref 1.005–1.030)
UROBILINOGEN UA: 0.2 mg/dL (ref 0.0–1.0)
pH: 6 (ref 5.0–8.0)

## 2014-11-19 LAB — GLUCOSE, CAPILLARY
GLUCOSE-CAPILLARY: 86 mg/dL (ref 65–99)
GLUCOSE-CAPILLARY: 136 mg/dL — AB (ref 65–99)
GLUCOSE-CAPILLARY: 109 mg/dL — AB (ref 65–99)
Glucose-Capillary: 107 mg/dL — ABNORMAL HIGH (ref 65–99)

## 2014-11-19 LAB — URINE MICROSCOPIC-ADD ON

## 2014-11-19 NOTE — Progress Notes (Signed)
Physical Therapy Session Note  Patient Details  Name: Travis Romero MRN: [id number] Date of Birth: May 06, 1950  Today's Date: 11/19/2014 PT Individual Time: 1300-1400 PT Individual Time Calculation (min): 60 min   Short Term Goals: Week 2:  PT Short Term Goal 1 (Week 2): pt will perform standing activity for 10 min with min assist for balance PT Short Term Goal 2 (Week 2): pt will ambulate least restricitive AD X 175 ft with min assist PT Short Term Goal 3 (Week 2): pt will perform stand pivot transfer with no AD with supervision PT Short Term Goal 4 (Week 2): pt will negotiate 14 six inch steps with supervision and fingertip support bilat UE  Skilled Therapeutic Interventions/Progress Updates:  Session focused on functional mobility, activity tolerance, dynamic balance and NMR. Pt ambulated with RW 175 X 2 requiring supervision. Pt performed dynamic balance activity of ambulating around cones no AD forward, sideways, and backwards requiring supervision and more than reasonable amount of time. Pt negotiated 15 stairs in stairwell using R handrail with reciprocal pattern ascending and step to pattern descending requiring supervision. Pt performed lateral step overs without UE support requiring min to mod assist for balance and cuing for technique and foot placement. Pt performed activity of picking up cones from floor no AD progressed from tall cones to short cones requiring supervision. Pt returned to room and left in wheelchair with all needs in reach and quick release belt on.  Therapy Documentation Precautions:  Precautions Precautions: Fall, Sternal Precaution Comments: watch HR  Restrictions Weight Bearing Restrictions: No (Sternal precautions) Pain: Pain Assessment Pain Assessment: Faces Faces Pain Scale: Hurts little more Pain Type: Acute pain Pain Location: Back Pain Orientation: Mid;Upper Pain Descriptors / Indicators: Aching Pain Onset: Gradual Pain Intervention(s):  Emotional support;Repositioned;Other (Comment) (provided upper back, and upper trap stretches)  Function:  Toileting Toileting       Bed Mobility Roll left and right activity      Sit to lying activity      Lying to sitting activity      Mobility details     Transfers Sit to stand transfer   Sit to stand assist level: Supervision or verbal cues Sit to stand assistive device: Walker  Chair/bed transfer   Chair/bed transfer method: Ambulatory Chair/bed transfer assist level: Supervision or verbal cues Chair/bed transfer assistive device: Walker   Chair/bed transfer details: Verbal cues for Print production planner device: Walker-rolling Max distance: 175 ft Assist level: Supervision or verbal cues  Walk 10 feet activity   Assist level: Supervision or verbal cues  Walk 50 feet with 2 turns activity   Assist level: Supervision or verbal cues  Walk 150 feet activity   Assist level: Supervision or verbal cues  Walk 10 feet on uneven surfaces activity      Stairs   Stairs assistive device: 1 hand rail Max number of stairs: 15 Stairs assist level: Supervision or verbal cues  Walk up/down 1 step activity     Walk up/down 1 step (curb) assist level: Supervision or verbal cues  Walk up/down 4 steps activity   Walk up/down 4 steps assist level: Supervision or verbal cues  Walk up/down 12 steps activity   Walk up/down 12 steps assist level: Supervision or verbal cues  Pick up small objects from floor   Assist level: Supervision or verbal cues  Wheelchair  Wheel 50 feet with 2 turns activity      Wheel 150 feet activity       Cognition Comprehension Comprehension assist level: Follows basic conversation/direction with no assist  Expression Expression assist level: Expresses basic needs/ideas: With extra time/assistive device  Social Interaction Social Interaction assist level: Interacts  appropriately 75 - 89% of the time - Needs redirection for appropriate language or to initiate interaction.  Problem Solving Problem solving assist level: Solves complex 90% of the time/cues < 10% of the time  Memory Memory assist level: Recognizes or recalls 75 - 89% of the time/requires cueing 10 - 24% of the time    Therapy/Group: Individual Therapy  Elsie Ra 11/19/2014, 5:33 PM

## 2014-11-19 NOTE — Progress Notes (Signed)
Occupational Therapy Session Note  Patient Details  Name: Travis Romero MRN: [id number] Date of Birth: Apr 29, 1950  Today's Date: 11/19/2014 OT Individual Time: 4098-1191 OT Individual Time Calculation (min): 30 min    Short Term Goals: Week 2:  OT Short Term Goal 1 (Week 2): Pt will complete LB dressing with mod A sit<>stand to increase functional independence.  OT Short Term Goal 2 (Week 2): Pt will complete UB dressing with supervision OT Short Term Goal 2 - Progress (Week 2): Progressing toward goal OT Short Term Goal 3 (Week 2): Pt will complete bathing tasks with min A OT Short Term Goal 3 - Progress (Week 2): Met OT Short Term Goal 4 (Week 2): Pt will perform toilet transfers with mod A OT Short Term Goal 4 - Progress (Week 2): Met  Skilled Therapeutic Interventions/Progress Updates:    Treatment session with focus on functional mobility and transfers in bathroom.  Pt reports pain in back, already notified RN, but willing to participate in treatment session.  Pt demonstrating good carryover of education with sit <> stand without use of UE.  Ambulated > 200 feet to ADL tubroom with 1 seated rest break.  Educated on tub/shower transfer with use of tub bench with demonstration and then cues for sequencing during transfer.  Pt able to demonstrate transfer a 2nd time with fewer verbal cues.  Required prolonged rest break prior to returning to room.  RN arrived mid-session and provided pain meds.  Returned to room and with question cues pt able to recall details of session to write down to improve recall of session.  Therapy Documentation Precautions:  Precautions Precautions: Fall, Sternal Precaution Comments: watch HR  Restrictions Weight Bearing Restrictions: No (Sternal precautions) Pain: Pain Assessment Pain Assessment: Faces Faces Pain Scale: Hurts little more Pain Type: Acute pain Pain Location: Back Pain Orientation: Mid;Upper Pain Descriptors / Indicators: Aching Pain  Onset: Gradual Pain Intervention(s): Emotional support;Repositioned;Other (Comment) (provided upper back, and upper trap stretches)  Function:    Transfers Sit to stand transfer   Sit to stand assist level: Supervision or verbal cues Sit to stand assistive device: Walker  Chair/bed transfer   Chair/bed transfer method: Ambulatory Chair/bed transfer assist level: Supervision or verbal cues Chair/bed transfer assistive device: Designer, television/film set transfer activity did not occur: Simulated Tub/shower assistive device: Tub transfer bench;Grab bars;Walker   Assist level into tub: Supervision or verbal cues Assist level out of tub: Supervision or verbal cues   Cognition Comprehension Comprehension assist level: Follows basic conversation/direction with no assist  Expression Expression assist level: Expresses basic needs/ideas: With extra time/assistive device  Social Interaction Social Interaction assist level: Interacts appropriately 90% of the time - Needs monitoring or encouragement for participation or interaction.  Problem Solving Problem solving assist level: Solves basic 75 - 89% of the time/requires cueing 10 - 24% of the time  Memory Memory assist level: Recognizes or recalls 75 - 89% of the time/requires cueing 10 - 24% of the time    Therapy/Group: Individual Therapy  Simonne Come 11/19/2014, 2:59 PM

## 2014-11-19 NOTE — Progress Notes (Signed)
Speech Language Pathology Weekly Progress and Session Notes  Patient Details  Name: Travis Romero MRN: [id number] Date of Birth: [birth year]  Beginning of progress report period: November 12, 2014 End of progress report period: November 19, 2014  Today's Date: 11/19/2014 SLP Individual Time: 0900-1000 SLP Individual Time Calculation (min): 60 min  Short Term Goals: Week 2: SLP Short Term Goal 1 (Week 2): Patient will utilize external memory aids to recall new, daily information with Min A multimodal cues. SLP Short Term Goal 1 - Progress (Week 2): Met SLP Short Term Goal 2 (Week 2): Patient will demonstrate sustained attention to a functional task for 60 minutes with Min verbal cues for redirection.  SLP Short Term Goal 2 - Progress (Week 2): Met SLP Short Term Goal 3 (Week 2): Patient will self-monitor and correct errors with functional tasks with Min verbal and question cues.  SLP Short Term Goal 3 - Progress (Week 2): Met SLP Short Term Goal 4 (Week 2): Patient will utilize an increased vocal intensity at the phrase level with supervision verbal cues to increase intelligibility to 100%.  SLP Short Term Goal 4 - Progress (Week 2): Met SLP Short Term Goal 5 (Week 2): Patient will demonstrate functional problem for basic and familiar tasks with Min A multimodal cues.  SLP Short Term Goal 5 - Progress (Week 2): Met    New Short Term Goals: Week 3: SLP Short Term Goal 1 (Week 3): Patient will utilize external memory aids to recall new, daily information with supervision multimodal cues. SLP Short Term Goal 2 (Week 3): Patient will demonstrate selective attention to a functional task for 60 minutes with supervision verbal cues for redirection.  SLP Short Term Goal 3 (Week 3): Patient will self-monitor and correct errors with functional tasks with supervision verbal and question cues.  SLP Short Term Goal 4 (Week 3): Patient will utilize an increased vocal intensity at the sentence level with  Mod I to increase intelligibility to 100%.  SLP Short Term Goal 5 (Week 3): Patient will demonstrate functional problem for mildly complex tasks with supervision verbal cues.   Weekly Progress Updates: Patient has made functional gains and excellent progress this reporting period and has met 5 of 5 STG's due to improved cognitive function and speech intelligibility. Currently, patient requires overall Min A multimodal cues for recall with use of external aids, functional problem solving, organization, awareness and safety. Patient also requires supervision verbal cues for sustained attention to tasks and for use of an increased vocal intensity to increase intelligibility to 100% at the sentence level.  Patient education is ongoing. Patient would benefit from continued skilled SLP intervention to maximize cognitive function in order to maximize his overall functional independence prior to discharge.   Intensity: Minumum of 1-2 x/day, 30 to 90 minutes Frequency: 3 to 5 out of 7 days Duration/Length of Stay: 11/26/14 Treatment/Interventions: Cognitive remediation/compensation;Cueing hierarchy;Functional tasks;Environmental controls;Internal/external aids;Patient/family education;Therapeutic Activities;Speech/Language facilitation   Daily Session  Skilled Therapeutic Interventions: Skilled treatment session focused on cognitive goals. Patient was Mod I for use of external memory aids to recall events from previous therapy sessions. Patient was also Mod I for functional problem solving with a mildly complex, money management task and required Min-Mod A verbal cues for organization of information. Patient also required supervision verbal cues for selective attention to task for 60 minutes in a mildly distracting environment. Patient independently requested to use the bathroom and required supervision verbal cues for safety with self-care tasks. Patient left with NT.  Continue with current plan of care.           FIM:  Function - Comprehension Comprehension: Auditory Comprehension assist level: Follows basic conversation/direction with no assist Function - Expression Expression: Verbal Expression assist level: Expresses basic needs/ideas: With extra time/assistive device Function - Social Interaction Social Interaction assist level: Interacts appropriately 90% of the time - Needs monitoring or encouragement for participation or interaction. Function - Problem Solving Problem solving assist level: Solves complex 90% of the time/cues < 10% of the time Function - Memory Memory assistive device: Memory book Memory assist level: Recognizes or recalls 75 - 89% of the time/requires cueing 10 - 24% of the time Patient normally able to recall (first 3 days only): Location of own room;That he or she is in a hospital Pain Pain Assessment Pain Assessment: Faces Faces Pain Scale: Hurts little more Pain Type: Acute pain Pain Location: Back Pain Orientation: Mid;Upper Pain Descriptors / Indicators: Aching Pain Onset: Gradual Pain Intervention(s): Emotional support;Repositioned;Other (Comment) (provided upper back, and upper trap stretches)  Therapy/Group: Individual Therapy  Travis Romero 11/19/2014, 3:20 PM

## 2014-11-19 NOTE — Progress Notes (Addendum)
[facility]     PROGRESS NOTE    Subjective/Complaints:r Has concerns about urine retention. Breathing about the same. activitiy tolerance better.  ROS:  nausea, vomiting, abdominal pain, diarrhea, chest pain,  palpitations, no neck pain which radiates to arms   Objective: Vital Signs: Blood pressure 166/80, pulse 102, temperature 98.5 F (36.9 C), temperature source Oral, resp. rate 18, height 5\' 7"  (1.702 m), weight 59.1 kg (130 lb 4.7 oz), SpO2 93 %. No results found. No results for input(s): WBC, HGB, HCT, PLT in the last 72 hours.  Recent Labs  11/18/14 0619  NA 134*  K 4.4  CL 97*  GLUCOSE 86  BUN 28*  CREATININE 1.21  CALCIUM 9.0   CBG (last 3)   Recent Labs  11/18/14 1651 11/18/14 2102 11/19/14 0637  GLUCAP 112* 134* 86    Wt Readings from Last 3 Encounters:  11/19/14 59.1 kg (130 lb 4.7 oz)  11/04/14 56.2 kg (123 lb 14.4 oz)  10/07/14 68.493 kg (151 lb)    Physical Exam:  Constitutional: He is oriented to person, place, and time. He appears well-developed and well-nourished.  Frail appearing male  HENT: oral mucosa a little dry. Voice stronger Head: Normocephalic and atraumatic.  Eyes: Conjunctivae are normal. Pupils are equal, round, and reactive to light.  Neck: Normal range of motion. Neck supple. Tenderness over upper traps Cardiovascular: Normal rate and regular rhythm. no murmurs, rubs, gallops Respiratory: Effort normal. Equal BS. He has no wheezes. No distress GI: Soft. Bowel sounds are normal. He exhibits no distension. There is no tenderness.  Musculoskeletal: He exhibits trace tibial edema.Marland Kitchen He exhibits no tenderness.  Neurological: He is alert and oriented to person, place, and time. Reasonable insight and awareness.  Dysphonic speech mild. Able to follow one and two step commands without difficulty.  UE: 4- deltoid, 4 bicep,right  tricep, wrist, hi LUE: tricep   4, wrist 4, HI 3, 4th/5th finger  drop. LE: 3 hf, 3+ ke and 4 adf/apf  Skin: Skin is warm and dry.  Midline sternal incision CDI, mild tenderness Small scabbed abrasion below left knee laterally.chest wall incision intact  Psychiatric: Thought content generally normal. Pleasant mood.   Assessment/Plan: 1. Functional deficits secondary to debility after CABG and multiple medical  which require 3+ hours per day of interdisciplinary therapy in a comprehensive inpatient rehab setting. Physiatrist is providing close team supervision and 24 hour management of active medical problems listed below. Physiatrist and rehab team continue to assess barriers to discharge/monitor patient progress toward functional and medical goals. FIM:    Function- Upper Body Dressing/Undressing What is the patient wearing?: Thread/unthread right sleeve of front closure shirt/dress, Thread/unthread left sleeve of front closure shirt/dress, Pull shirt around back of front closure shirt/dress Function - Lower Body Dressing/Undressing Lower body dressing/undressing activity did not occur: 5: Supervision: Safety issues/verbal cues What is the patient wearing?: Don/Doff right sock, Don/Doff left sock, Don/Doff right shoe, Don/Doff left shoe, Fasten/unfasten right shoe, Fasten/unfasten left shoe        Function - Chair/bed transfer Chair/bed transfer assistive device: Walker     Function - Comprehension Comprehension: Auditory  Function - Expression Expression: Verbal        Function - Memory Memory assistive device: Memory book  Medical Problem List and Plan: 1. Functional deficits secondary to debility after acute respiratory failure and CHF exacerbation s/p CABG.  -some improvement in exercise tolerance  -discussed memory issues---although there is improvement  -weaning O2  to keep sats >90% 2. DVT Prophylaxis/Anticoagulation: Pharmaceutical: Lovenox due to VTE risk is required 3. Pain Management: oxycodone prn---observe activity  tolerance with therapy 4. Mood: Increase Remeron to home dose to help manage insomnia and mood. Team to provide ego support. LCSW to follow for evaluation and support.  5. Neuropsych: This patient is capable of making decisions on his own behalf. 6. Skin/Wound Care: Routine pressure relief measures. Maintain adequate nutritional intake. Offer supplements between meals.  7. Fluids/Electrolytes/Nutrition: Monitor I/O. Daily labs to monitor metabolic/electrolyte status, ~1067ml fluid on 8/11.  100% meals 8. CAD s/p CABG: Continue sternal precautions. Continues to have incisional pain but no CP. On ASA, lipitor, coreg and Entresto.  9. Acute on systolic CHF: Monitor daily weights. Low salt diet.   aldactone, coreg, Entestor and digoxin.  -lasix 40mg   - Cardiology following also.no recent med changes   -all labs personally reviewed/stable. Weight around 59kg 10 COPD: Avoiding Spiriva with BPH/voiding difficulties. Continue Brovan/pulmicort with prn Xopenex. To be discharged on symbicort 80/4.5 2 puffs bid + prn xopenex  -outpt follow up with pulmonary at dc  -lungs remain generally clear--airflow a little better.    11. ABLA: Slowly improving.  12. NLZ:JQBHALPFX improved. Watch sbp  13. Urinary retention/BPH: continue Avodart.  ICP with high volumes, added flomax--at 0.8mg  qpm  -no spont voids  -urine with odor---check ua/cx  -i/o caths prn 14. Adjustment disorder: Mentation improving gradually.Continue Xanax for chronic anxiety/ adjustment reaction.  15. Reactive leucocytosis: Likely due to steroids/surgery. No signs of infection on exam. Continue to monitor 16. Stress induced hyperglycemia: Hgb A1C- 5.9. Improved    LOS (Days) 15 A FACE TO FACE EVALUATION WAS PERFORMED  Travis Romero 11/19/2014 8:30 AM

## 2014-11-19 NOTE — Progress Notes (Signed)
Occupational Therapy Session Note  Patient Details  Name: Travis Romero MRN: [id number] Date of Birth: 23-Aug-1950  Today's Date: 11/19/2014 OT Individual Time: 0700-0800 OT Individual Time Calculation (min): 60 min    Short Term Goals: Week 2:  OT Short Term Goal 1 (Week 2): Pt will complete LB dressing with mod A sit<>stand to increase functional independence.  OT Short Term Goal 2 (Week 2): Pt will complete UB dressing with supervision OT Short Term Goal 2 - Progress (Week 2): Progressing toward goal OT Short Term Goal 3 (Week 2): Pt will complete bathing tasks with min A OT Short Term Goal 3 - Progress (Week 2): Met OT Short Term Goal 4 (Week 2): Pt will perform toilet transfers with mod A OT Short Term Goal 4 - Progress (Week 2): Met Week 3: STG=LTG secondary to ELOS  Skilled Therapeutic Interventions/Progress Updates:    Pt engaged in BADL retraining including bathing and dressing with sit<>stand from w/c at sink.  Pt continues to decline shower secondary to not wanting to get cold.  Pt states he is very cold natured. Pt requires more than a reasonable amount of time to initiate and complete all tasks.  Pt requires min verbal cues for sequencing.  Focus on activity tolerance, sit<>stand, standing balance, functional transfers and amb with RW, task initiation, and safety awareness.  Therapy Documentation Precautions:  Precautions Precautions: Fall, Sternal Precaution Comments: watch HR  Restrictions Weight Bearing Restrictions: No Pain: Pain Assessment Pain Assessment: No/denies pain  Function:   Eating Eating   Eating Assist Level: More than reasonable amount of time           Grooming Oral Care,Brush Teeth, Clean Dentures Activity:      Assist Level: Supervision or verbal cues      Wash, Rinse, Dry Face Activity   Assist Level: Supervision or verbal cues      Wash, Rinse, Dry Hands Activity   Assist Level: Supervision or verbal cues      Brush, Comb  Hair Activity        Shave Activity Shave activity did not occur: Refused        Apply Makeup Activity Apply makeup activity did not occur: N/A                                                           Bathing Bathing position   Position: Wheelchair/chair at sink  Bathing parts Body parts bathed by patient: Right arm;Left arm;Chest;Abdomen;Front perineal area;Buttocks;Right upper leg;Left upper leg Body parts bathed by helper: Right lower leg;Left lower leg;Back  Bathing assist Assist Level: Touching or steadying assistance(Pt > 75%)       Upper Body Dressing/Undressing Upper body dressing   What is the patient wearing?: Button up shirt         Button up shirt - Perfomed by patient: Thread/unthread right sleeve;Thread/unthread left sleeve;Pull shirt around back Button up shirt - Perfomed by helper: Button/unbutton shirt    Upper body assist Assist Level: Touching or steadying assistance(Pt > 75%)       Lower Body Dressing/Undressing Lower body dressing   What is the patient wearing?: Pants     Pants- Performed by patient: Thread/unthread right pants leg;Thread/unthread left pants leg Pants- Performed by helper: Fasten/unfasten pants  Lower body assist Assist Level: Touching or steadying assistance (Pt > 75%)       Toileting Toileting Toileting activity did not occur: Refused        Toileting assist      Bed Mobility Roll left and right activity   Assist level: Supervision or verbal cues  Sit to lying activity   Assist level: Supervision or verbal cues  Lying to sitting activity   Assist level: Supervision or verbal cues  Mobility details     Transfers Sit to stand transfer   Sit to stand assist level: Supervision or verbal cues Sit to stand assistive device: Walker  Chair/bed transfer   Chair/bed transfer method: Ambulatory   Chair/bed transfer assistive device: Horticulturist, commercial transfer activity did not occur: Refused           Cognition Comprehension    Expression    Social Interaction    Problem Solving    Memory      Therapy/Group: Individual Therapy  Travis Romero 11/19/2014, 9:03 AM

## 2014-11-19 NOTE — Patient Care Conference (Signed)
Inpatient RehabilitationTeam Conference and Plan of Care Update Date: 11/19/2014   Time: 2:50 PM    Patient Name: Travis Romero      Medical Record Number: [id number]  Date of Birth: Jun 16, 1950 Sex: Male         Room/Bed: 4W18C/4W18C-01 Payor Info: Payor: [employer] / Plan: [employer]ST / Product Type: *No Product type* /    Admitting Diagnosis: Debility after resp failure  CHF and CABG  Admit Date/Time:  11/04/2014  2:44 PM Admission Comments: No comment available   Primary Diagnosis:  Physical debility Principal Problem: Physical debility  Patient Active Problem List   Diagnosis Date Noted  . Chronic systolic heart failure   . Physical debility 11/04/2014  . 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 Urinary retention   . Pleural effusion   . BPH (benign prostatic hypertrophy) with urinary retention   . Coronary artery disease involving native coronary artery of native heart without angina pectoris   . SOB (shortness of breath)   . Tobacco abuse   . HLD (hyperlipidemia)   . Severe peripheral arterial disease 10/18/2014  . NSTEMI (non-ST elevated myocardial infarction)   . Acute on chronic combined systolic and diastolic CHF (congestive heart failure)   . Shortness of breath   . Adjustment disorder with anxious mood   . Acute respiratory failure with hypoxia 10/13/2014  . Acute combined systolic and diastolic CHF, NYHA class 4 10/13/2014  . Acute pulmonary edema   . Endotracheal tube present   . CHF exacerbation 10/12/2014  . Benign essential HTN 10/07/2014  . BPH (benign prostatic hyperplasia) 10/07/2014  . COPD (chronic obstructive pulmonary disease) 10/07/2014  . Smoker 10/07/2014    Expected Discharge Date: Expected Discharge Date: 11/26/14  Team Members Present: Physician leading conference: Dr. Alger Romero Social Worker Present: Travis Pall, LCSW Nurse Present: Travis Chihuahua, RN PT Present: Travis Romero, PT;Travis Romero, PT;Other (comment) (Travis Romero - Crew, PT) OT Present:  Travis Romero, Travis Romero, OT SLP Present: Travis Romero, SLP PPS Coordinator present : Travis Nakayama, RN, CRRN     Current Status/Progress Goal Weekly Team Focus  Medical   memory better. CV balanced. still some weakness left hand and arm. stronger, off oxygen, urine retention  improve activity tolerance  bladder emptying, cv mgt   Bowel/Bladder   Continent of bowel. LBM 11/17/14. Requires I&O cath q 6-8hrs         Swallow/Nutrition/ Hydration             ADL's   functional transfers-steady A/supervision; UB dressing-min A; LB dressing-mod A; decreased activity tolerance  supervision overall  standing balance, activity tolerance, functional transfers, safety awareness   Mobility   supervison for transfers and ambulation with RW up to 175 ft, supervision for stairs x 15.   supervision with RW  activity tolerance, D/C planning, dynamic balance, NMR, functional mobility   Communication   Supervision-Mod I  Mod I   increased vocal intensity and vocal hygiene   Safety/Cognition/ Behavioral Observations  Min A-Supervision  Supervision  recall with use of aids, mildly complex problem solving, awareness and attention    Pain   Requested Tramadol 50mg  q 4hrs, for genralized discomfort  <8  Encourage therapeutic technique    Skin   CABG incision healing appropriately  No additional skin breakdown  Assess q shift. Encourage turn q 2hrs    Rehab Goals Patient on target to meet rehab goals: Yes *See Care Plan and progress notes for long and short-term goals.  Barriers  to Discharge: memory safety, bladder    Possible Resolutions to Barriers:  ?i/o cath, supervision at home    Discharge Planning/Teaching Needs:  Sister now able to return to stay with pt upon d/c x one month and provide 24/7 assistance  closer to d/c   Team Discussion:  Improving medically overall.  Off O2.   Cognition improved and utilizing strategies for memory impairments.  Still with urinary retention and have  increased flomax but will also recheck UA.  May have to d/c home performing I/O caths.  SW reports sister now to be here for a month post d/c.  Remains very motivated and still working on stairs as has two flights at his apartment.  Revisions to Treatment Plan:  None   Continued Need for Acute Rehabilitation Level of Care: The patient requires daily medical management by a physician with specialized training in physical medicine and rehabilitation for the following conditions: Daily direction of a multidisciplinary physical rehabilitation program to ensure safe treatment while eliciting the highest outcome that is of practical value to the patient.: Yes Daily medical management of patient stability for increased activity during participation in an intensive rehabilitation regime.: Yes Daily analysis of laboratory values and/or radiology reports with any subsequent need for medication adjustment of medical intervention for : Post surgical problems;Cardiac problems  Travis Romero 11/19/2014, 4:00 PM

## 2014-11-20 ENCOUNTER — Inpatient Hospital Stay (HOSPITAL_COMMUNITY): Payer: PRIVATE HEALTH INSURANCE | Admitting: Physical Therapy

## 2014-11-20 ENCOUNTER — Inpatient Hospital Stay (HOSPITAL_COMMUNITY): Payer: PRIVATE HEALTH INSURANCE | Admitting: Speech Pathology

## 2014-11-20 ENCOUNTER — Inpatient Hospital Stay (HOSPITAL_COMMUNITY): Payer: PRIVATE HEALTH INSURANCE

## 2014-11-20 LAB — GLUCOSE, CAPILLARY
GLUCOSE-CAPILLARY: 143 mg/dL — AB (ref 65–99)
Glucose-Capillary: 103 mg/dL — ABNORMAL HIGH (ref 65–99)

## 2014-11-20 MED ORDER — AMOXICILLIN 250 MG PO CAPS
250.0000 mg | ORAL_CAPSULE | Freq: Three times a day (TID) | ORAL | Status: DC
  Administered 2014-11-20 – 2014-11-22 (×7): 250 mg via ORAL
  Filled 2014-11-20 (×7): qty 1

## 2014-11-20 NOTE — Progress Notes (Signed)
[facility] PHYSICAL MEDICINE & REHABILITATION     PROGRESS NOTE    Subjective/Complaints:r Still concerns about urine retention. Had flow issues before admission.   ROS:  nausea, vomiting, abdominal pain, diarrhea, chest pain,  palpitations,     Objective: Vital Signs: Blood pressure 154/73, pulse 104, temperature 98.4 F (36.9 C), temperature source Oral, resp. rate 18, height 5\' 7"  (1.702 m), weight 61.9 kg (136 lb 7.4 oz), SpO2 97 %. No results found. No results for input(s): WBC, HGB, HCT, PLT in the last 72 hours.  Recent Labs  11/18/14 0619  NA 134*  K 4.4  CL 97*  GLUCOSE 86  BUN 28*  CREATININE 1.21  CALCIUM 9.0   CBG (last 3)   Recent Labs  11/19/14 1700 11/19/14 2105 11/20/14 0627  GLUCAP 107* 109* 103*    Wt Readings from Last 3 Encounters:  11/20/14 61.9 kg (136 lb 7.4 oz)  11/04/14 56.2 kg (123 lb 14.4 oz)  10/07/14 68.493 kg (151 lb)    Physical Exam:  Constitutional: He is oriented to person, place, and time. He appears well-developed and well-nourished.  Frail appearing male  HENT: oral mucosa a little dry. Voice stronger Head: Normocephalic and atraumatic.  Eyes: Conjunctivae are normal. Pupils are equal, round, and reactive to light.  Neck: Normal range of motion. Neck supple. Tenderness over upper traps Cardiovascular: Normal rate and regular rhythm. no murmurs, rubs, gallops Respiratory: Effort normal. Equal BS. He has no wheezes. No distress GI: Soft. Bowel sounds are normal. He exhibits no distension. There is no tenderness.  Musculoskeletal: He exhibits trace tibial edema.Marland Kitchen He exhibits no tenderness.  Neurological: He is alert and oriented to person, place, and time. Reasonable insight and awareness.  Dysphonic speech mild. Able to follow one and two step commands without difficulty.  UE: 4- deltoid, 4 bicep,right  tricep, wrist, hi LUE: tricep   4, wrist 4, HI 3, 4th/5th finger drop. LE: 3 hf, 3+ ke and 4 adf/apf  Skin:  Skin is warm and dry.  Midline sternal incision CDI, mild tenderness Schest wall incision well healed Psychiatric: Thought content generally normal. Pleasant mood.   Assessment/Plan: 1. Functional deficits secondary to debility after CABG and multiple medical  which require 3+ hours per day of interdisciplinary therapy in a comprehensive inpatient rehab setting. Physiatrist is providing close team supervision and 24 hour management of active medical problems listed below. Physiatrist and rehab team continue to assess barriers to discharge/monitor patient progress toward functional and medical goals. FIM: Function - Bathing Position: Wheelchair/chair at sink Body parts bathed by patient: Right arm, Left arm, Chest, Abdomen, Front perineal area, Buttocks, Right upper leg, Left upper leg Body parts bathed by helper: Right lower leg, Left lower leg, Back Assist Level: Touching or steadying assistance(Pt > 75%)  Function- Upper Body Dressing/Undressing What is the patient wearing?: Button up shirt Button up shirt - Perfomed by patient: Thread/unthread right sleeve, Thread/unthread left sleeve, Pull shirt around back Button up shirt - Perfomed by helper: Button/unbutton shirt Assist Level: Touching or steadying assistance(Pt > 75%) Function - Lower Body Dressing/Undressing Lower body dressing/undressing activity did not occur: Refused What is the patient wearing?: Pants Position: Wheelchair/chair at sink Pants- Performed by patient: Thread/unthread right pants leg, Thread/unthread left pants leg Pants- Performed by helper: Fasten/unfasten pants Assist Level: Touching or steadying assistance (Pt > 75%)  Function - Toileting Toileting activity did not occur: No continent bowel/bladder event Toileting steps completed by patient: Adjust clothing prior to toileting, Performs  perineal hygiene Toileting steps completed by helper: Adjust clothing after toileting Toileting Assistive Devices: Grab  bar or rail  Function - Air cabin crew transfer activity did not occur: Safety/medical concerns Toilet transfer assistive device: Grab bar, Walker Assist level to toilet: Touching or steadying assistance (Pt > 75%) Assist level from toilet: Touching or steadying assistance (Pt > 75%)  Function - Chair/bed transfer Chair/bed transfer method: Ambulatory Chair/bed transfer assist level: Supervision or verbal cues Chair/bed transfer assistive device: Walker Chair/bed transfer details: Verbal cues for technique  Function - Locomotion: Wheelchair Will patient use wheelchair at discharge?: No Function - Locomotion: Ambulation Assistive device: Walker-rolling Max distance: 175 ft Assist level: Supervision or verbal cues Assist level: Supervision or verbal cues Assist level: Supervision or verbal cues Assist level: Supervision or verbal cues  Function - Comprehension Comprehension: Auditory Comprehension assist level: Follows basic conversation/direction with no assist  Function - Expression Expression: Verbal Expression assist level: Expresses basic needs/ideas: With extra time/assistive device  Function - Social Interaction Social Interaction assist level: Interacts appropriately 75 - 89% of the time - Needs redirection for appropriate language or to initiate interaction.  Function - Problem Solving Problem solving assist level: Solves complex 90% of the time/cues < 10% of the time  Function - Memory Memory assistive device: Memory book Memory assist level: Recognizes or recalls 75 - 89% of the time/requires cueing 10 - 24% of the time Patient normally able to recall (first 3 days only): Location of own room, That he or she is in a hospital  Medical Problem List and Plan: 1. Functional deficits secondary to debility after acute respiratory failure and CHF exacerbation s/p CABG.  -better exercise tolerance  -  memory issues---with improvement    2. DVT  Prophylaxis/Anticoagulation: Pharmaceutical: Lovenox due to VTE risk   3. Pain Management: oxycodone prn---observe activity tolerance with therapy 4. Mood: Increase Remeron to home dose to help manage insomnia and mood. Team to provide ego support. LCSW to follow for evaluation and support.  5. Neuropsych: This patient is capable of making decisions on his own behalf. 6. Skin/Wound Care: Routine pressure relief measures. Maintain adequate nutritional intake. Offer supplements between meals.  7. Fluids/Electrolytes/Nutrition: Monitor [email or web address] better 8. CAD s/p CABG: Continue sternal precautions. Continues to have incisional pain but no CP. On ASA, lipitor, coreg and Entresto.  9. Acute on systolic CHF: Monitor daily weights. Low salt diet.   aldactone, coreg, Entestor and digoxin.  -lasix 40mg   - Cardiology following also.no recent med changes   -all labs personally reviewed/stable. Weight around 62kg today===correct 10 COPD: Avoiding Spiriva with BPH/voiding difficulties. Continue Brovan/pulmicort with prn Xopenex. To be discharged on symbicort 80/4.5 2 puffs bid + prn xopenex  -outpt follow up with pulmonary at dc  -lungs remain generally clear--airflow a little better.    11. ABLA: Slowly improving.  12. WUJ:[id number] improved. Watch sbp  13. Urinary retention/BPH: continue Avodart.  ICP with high volumes, added flomax--at 0.8mg  qpm  -no spont voids  -urine with odor--ua+, will begin empiric amoxil  -i/o caths prn 14. Adjustment disorder: Mentation improving gradually.Continue Xanax for chronic anxiety/ adjustment reaction.  15. Reactive leucocytosis: Likely due to steroids/surgery. No signs of infection on exam. Continue to monitor 16. Stress induced hyperglycemia: Hgb A1C- 5.9. Improved    LOS (Days) 16 A FACE TO FACE EVALUATION WAS PERFORMED  Artasia Thang T 11/20/2014 9:28 AM

## 2014-11-20 NOTE — Progress Notes (Signed)
Physical Therapy Weekly Progress Note  Patient Details  Name: Travis Romero MRN: [id number] Date of Birth: Mar 21, 1951  Beginning of progress report period: November 13, 2014 End of progress report period: November 20, 2014  Today's Date: 11/20/2014 PT Individual Time: 0950-1050 and 1430-1500  PT Individual Time Calculation (min): 60 min and 30 min  Patient has met 4 of 4 short term goals. Pt is steadily progressing toward long term goals. Pt has improved with functional mobility to overall supervision level. Limiting factors are activity tolerance, short term memory and recall, problem solving, and intellectual awareness of physical and cognitive deficits. Plan for family training with sister for patients current level of function and plan for discharge home with recommendation for 24 hour supervision.  Patient continues to demonstrate the following deficits: balance, decreased coordination, decreased motor control, decreased activity tolerance, and cognitive deficits and therefore will continue to benefit from skilled PT intervention to enhance overall performance with activity tolerance, balance, attention, awareness, coordination, memory, and problem solving  Patient progressing toward long term goals..  Continue plan of care.  PT Short Term Goals Week 2:  PT Short Term Goal 1 (Week 2): pt will perform standing activity for 10 min with min assist for balance PT Short Term Goal 1 - Progress (Week 2): Met PT Short Term Goal 2 (Week 2): pt will ambulate least restricitive AD X 175 ft with min assist PT Short Term Goal 2 - Progress (Week 2): Met PT Short Term Goal 3 (Week 2): pt will perform stand pivot transfer with no AD with supervision PT Short Term Goal 3 - Progress (Week 2): Met PT Short Term Goal 4 (Week 2): pt will negotiate 14 six inch steps with supervision and fingertip support bilat UE PT Short Term Goal 4 - Progress (Week 2): Met  Week 3: STG's=LTG's due to ELOS  Skilled  Therapeutic Interventions/Progress Updates:  Session 1 focused on activity tolerance and dynamic balance. Pt ambulated with RW 250 feet requiring supervision and cues for posture and to look up. Pt performed simulated car transfer at [place] with RW and supervision for technique and hand placement. Pt performed dynamic balance activities of stepping over low objects forward and laterally, stepping to targets multidirectional, and tandem walking requiring overall min A, except tandem walking mod to max A for balance. Pt ambulated with RW back to room 200 feet including 50 feet over carpet in day room requiring supervision. Pt demonstrated improvement with transfers with and without AD requiring no cues and supervision. Pt left in wheelchair with all needs in reach and quick release belt on.  Session 2 focused on activity tolerance and community ambulation. Pt ambulated with RW around 1000 feet with supervision to gift shop. After extended rest period pt performed working memory task of retrieving 3 of 3 items in gift shop with min cuing. Pt ambulated over carpet and outside on sidewalk X 50 feet with RW and supervision. Pt negotiated up down curb with RW with supervision and cuing for sequence and technique. Pt left in room in wheelchair with all needs in reach.  Therapy Documentation Precautions:  Precautions Precautions: Fall, Sternal Precaution Comments: watch HR  Restrictions Weight Bearing Restrictions: No Pain: Pain Assessment Pain Assessment: No/denies pain Faces Pain Scale: Hurts little more Pain Type: Chronic pain Pain Location: Generalized Pain Orientation: Mid;Upper Pain Descriptors / Indicators: Aching Pain Onset: On-going Pain Intervention(s): Emotional support;Repositioned   Function:  Toileting Toileting    Bed Mobility Roll left and right activity  Sit to lying activity      Lying to sitting activity     Mobility details     Transfers Sit to stand transfer    Sit to stand assist level: Supervision or verbal cues Sit to stand assistive device: Walker;Armrests  Chair/bed transfer   Chair/bed transfer method: Ambulatory Chair/bed transfer assist level: Supervision or verbal cues Chair/bed transfer assistive device: Walker   Chair/bed transfer details: Verbal cues for Programme researcher, broadcasting/film/video transfer assistive device: Publishing rights manager transfer assist level: Supervision or Lawyer device: Walker-rolling Max distance: 250 ft Assist level: Supervision or verbal cues  Walk 10 feet activity   Assist level: Supervision or verbal cues  Walk 50 feet with 2 turns activity   Assist level: Supervision or verbal cues  Walk 150 feet activity   Assist level: Supervision or verbal cues  Walk 10 feet on uneven surfaces activity      Stairs          Walk up/down 1 step activity        Walk up/down 4 steps activity      Walk up/down 12 steps activity      Pick up small objects from floor      Wheelchair          Wheel 50 feet with 2 turns activity      Wheel 150 feet activity       Cognition Comprehension Comprehension assist level: Follows basic conversation/direction with no assist  Expression Expression assist level: Expresses basic needs/ideas: With extra time/assistive device  Social Interaction Social Interaction assist level: Interacts appropriately 90% of the time - Needs monitoring or encouragement for participation or interaction.  Problem Solving Problem solving assist level: Solves basic problems with no assist  Memory Memory assist level: Recognizes or recalls 75 - 89% of the time/requires cueing 10 - 24% of the time    Therapy/Group: Individual Therapy  Elsie Ra 11/20/2014, 10:59 AM

## 2014-11-20 NOTE — Progress Notes (Signed)
Occupational Therapy Session Note  Patient Details  Name: Travis Romero MRN: [id number] Date of Birth: 08-01-1950  Today's Date: 11/20/2014 OT Individual Time: 0700-0800 OT Individual Time Calculation (min): 60 min    Short Term Goals: Week 3:  OT Short Term Goal 1 (Week 3): STG=LTG due to ELOS  Skilled Therapeutic Interventions/Progress Updates:    Pt engaged in BADL retraining including bathing and dressing with sit<>stand from w/c at sink.  Pt requested use of toilet and amb with RW to stand at toilet to urinate.  Pt required steady A while standing.  Pt returned to w/c and completed bathing and dressing tasks.  Pt continues to have difficulty fastening/unfasterning buttons on shirt.  Recommended pull over shirt but patient declined at this time.  Pt continues to exhibit increased activity tolerance and is able to stand to doff/donn shirt and pull up pants.  Pt continues to require more than a reasonable amount of time to complete tasks.  Focus on BADL retraining, standing balance, sit<>stand, functional amb with RW for home mgmt tasks, task initiation, sequencing, and safety awareness to increase independence with BADLs.  Therapy Documentation Precautions:  Precautions Precautions: Fall, Sternal Precaution Comments: watch HR  Restrictions Weight Bearing Restrictions: No General:   Vital Signs: Therapy Vitals Temp: 98.4 F (36.9 C) Temp Source: Oral Pulse Rate: (!) 104 Resp: 18 BP: (!) 154/73 mmHg Patient Position (if appropriate): Sitting Oxygen Therapy SpO2: 97 % O2 Device: Nasal Cannula O2 Flow Rate (L/min): 2 L/min Pain: Pain Assessment Pain Assessment: No/denies pain ADL:   Exercises:   Other Treatments:    Function:   Eating Eating               Grooming Oral Care,Brush Teeth, Clean Dentures Activity:      Assist Level: Supervision or verbal cues      Wash, Rinse, Dry Face Activity   Assist Level: Supervision or verbal cues      Wash,  Rinse, Dry Hands Activity   Assist Level: Supervision or verbal cues      Brush, Comb Hair Activity   Assist Level: More than reasonable time    Shave Activity          Apply Makeup Activity Apply makeup activity did not occur: Patient does not wear makeup                                                           Bathing Bathing position   Position: Wheelchair/chair at sink  Bathing parts Body parts bathed by patient: Right arm;Left arm;Chest;Abdomen;Front perineal area;Buttocks;Right upper leg;Left upper leg Body parts bathed by helper: Right lower leg;Left lower leg;Back  Bathing assist Assist Level: Touching or steadying assistance(Pt > 75%)       Upper Body Dressing/Undressing Upper body dressing   What is the patient wearing?: Button up shirt         Button up shirt - Perfomed by patient: Thread/unthread right sleeve;Thread/unthread left sleeve;Pull shirt around back Button up shirt - Perfomed by helper: Button/unbutton shirt    Upper body assist Assist Level: Touching or steadying assistance(Pt > 75%)       Lower Body Dressing/Undressing Lower body dressing Lower body dressing/undressing activity did not occur: Refused  Lower body assist         Toileting Toileting   Toileting steps completed by patient: Adjust clothing prior to toileting;Performs perineal hygiene Toileting steps completed by helper: Adjust clothing after toileting Toileting Assistive Devices: Grab bar or rail  Toileting assist      Bed Mobility Roll left and right activity   Assist level: Supervision or verbal cues  Sit to lying activity      Lying to sitting activity   Assist level: Supervision or verbal cues  Mobility details     Transfers Sit to stand transfer   Sit to stand assist level: Supervision or verbal cues    Chair/bed transfer   Chair/bed transfer method: Ambulatory Chair/bed transfer assist level: Supervision or  verbal cues Chair/bed transfer assistive device: Walker   Chair/bed transfer details: Verbal cues for technique  Toilet transfer   Toilet transfer assistive device: Grab bar;Walker       Assist level to toilet: Touching or steadying assistance (Pt > 75%) Assist level from toilet: Touching or steadying assistance (Pt > 75%)  Tub/shower transfer             Cognition Comprehension    Expression    Social Interaction    Problem Solving    Memory      Therapy/Group: Individual Therapy  [name] 11/20/2014, 8:06 AM

## 2014-11-21 ENCOUNTER — Inpatient Hospital Stay (HOSPITAL_COMMUNITY): Payer: PRIVATE HEALTH INSURANCE

## 2014-11-21 ENCOUNTER — Inpatient Hospital Stay (HOSPITAL_COMMUNITY): Payer: PRIVATE HEALTH INSURANCE | Admitting: Physical Therapy

## 2014-11-21 ENCOUNTER — Inpatient Hospital Stay (HOSPITAL_COMMUNITY): Payer: PRIVATE HEALTH INSURANCE | Admitting: Speech Pathology

## 2014-11-21 LAB — URINE CULTURE: Culture: >=100000

## 2014-11-21 MED ORDER — LIDOCAINE HCL 2 % EX GEL
1.0000 "application " | CUTANEOUS | Status: DC | PRN
  Administered 2014-11-21 – 2014-11-25 (×6): 1 via URETHRAL
  Filled 2014-11-21 (×4): qty 5

## 2014-11-21 MED ORDER — LIDOCAINE 4 % EX CREA
TOPICAL_CREAM | CUTANEOUS | Status: DC | PRN
  Filled 2014-11-21: qty 5

## 2014-11-21 NOTE — Progress Notes (Signed)
Speech Language Pathology Daily Session Note  Patient Details  Name: Travis Romero MRN: [id number] Date of Birth: [birth year]  Today's Date: 11/21/2014 SLP Individual Time: 1005-1100 SLP Individual Time Calculation (min): 55 min  Short Term Goals: Week 3: SLP Short Term Goal 1 (Week 3): Patient will utilize external memory aids to recall new, daily information with supervision multimodal cues. SLP Short Term Goal 2 (Week 3): Patient will demonstrate selective attention to a functional task for 60 minutes with supervision verbal cues for redirection.  SLP Short Term Goal 3 (Week 3): Patient will self-monitor and correct errors with functional tasks with supervision verbal and question cues.  SLP Short Term Goal 4 (Week 3): Patient will utilize an increased vocal intensity at the sentence level with Mod I to increase intelligibility to 100%.  SLP Short Term Goal 5 (Week 3): Patient will demonstrate functional problem for mildly complex tasks with supervision verbal cues.   Skilled Therapeutic Interventions: Skilled treatment session focused on cognitive goals. SLP facilitated session by providing extra time for use of memory compensatory strategies to recall events from previous therapy sessions. Patient completed a mildly complex organization task of transferring a list of activities onto a calender with focus on problem solving, organization and use of memory compensatory strategies. Patient required supervision verbal cues to complete task. Patient demonstrated selective attention to task for 45 minutes in a mildly distracting environment with supervision verbal cues. Patient was also 100% intelligible at the conversation level with Mod I. Patient left upright in wheelchair with quick release belt in place and all needs within reach. Continue with current plan of care.    Function:  Cognition Comprehension Comprehension assist level: Understands complex 90% of the time/cues 10% of the time   Expression   Expression assist level: Expresses basic 90% of the time/requires cueing < 10% of the time.  Social Interaction Social Interaction assist level: Interacts appropriately 90% of the time - Needs monitoring or encouragement for participation or interaction.  Problem Solving Problem solving assist level: Solves basic 90% of the time/requires cueing < 10% of the time  Memory Memory assist level: Recognizes or recalls 90% of the time/requires cueing < 10% of the time    Pain Pain Assessment Pain Assessment: No/denies pain   Therapy/Group: Individual Therapy  Salli Bodin 11/21/2014, 11:32 AM

## 2014-11-21 NOTE — Plan of Care (Signed)
Problem: RH BLADDER ELIMINATION Goal: RH STG MANAGE BLADDER WITH EQUIPMENT WITH ASSISTANCE STG Manage Bladder With Equipment With Assistance. Min A  Outcome: Not Progressing I&O cath needed- done by staff

## 2014-11-21 NOTE — Progress Notes (Signed)
[facility]     PROGRESS NOTE    Subjective/Complaints:   Still requiring i/o caths. Has urge to empty. Very concerned about losing job/benefits  ROS:  nausea, vomiting, abdominal pain, diarrhea, chest pain,  palpitations,     Objective: Vital Signs: Blood pressure 154/79, pulse 101, temperature 98.5 F (36.9 C), temperature source Oral, resp. rate 16, height 5\' 7"  (1.702 m), weight 61.9 kg (136 lb 7.4 oz), SpO2 92 %. No results found. No results for input(s): WBC, HGB, HCT, PLT in the last 72 hours. No results for input(s): NA, K, CL, GLUCOSE, BUN, CREATININE, CALCIUM in the last 72 hours.  Invalid input(s): CO CBG (last 3)   Recent Labs  11/19/14 2105 11/20/14 0627 11/20/14 1117  GLUCAP 109* 103* 143*    Wt Readings from Last 3 Encounters:  11/21/14 61.9 kg (136 lb 7.4 oz)  11/04/14 56.2 kg (123 lb 14.4 oz)  10/07/14 68.493 kg (151 lb)    Physical Exam:  Constitutional: He is oriented to person, place, and time. He appears well-developed and well-nourished.  Frail appearing male  HENT: oral mucosa a little dry. Voice stronger Head: Normocephalic and atraumatic.  Eyes: Conjunctivae are normal. Pupils are equal, round, and reactive to light.  Neck: Normal range of motion. Neck supple. Tenderness over upper traps Cardiovascular: Normal rate and regular rhythm. no murmurs, rubs, gallops Respiratory: Effort normal. Equal BS. He has no wheezes. No distress GI: Soft. Bowel sounds are normal. He exhibits no distension. There is no tenderness.  Musculoskeletal: He exhibits trace tibial edema.Marland Kitchen He exhibits no tenderness.  Neurological: He is alert and oriented to person, place, and time. Reasonable insight and awareness.  Dysphonic speech mild. Able to follow one and two step commands without difficulty.  UE: 4- deltoid, 4 bicep,right  tricep, wrist, hi LUE: tricep   4, wrist 4, HI 3, 4th/5th finger drop. LE: 3 hf, 3+ ke and 4  adf/apf  Skin: Skin is warm and dry.  Midline sternal incision CDI, mild tenderness Schest wall incision well healed Psychiatric: Thought content generally normal. Pleasant mood.   Assessment/Plan: 1. Functional deficits secondary to debility after CABG and multiple medical  which require 3+ hours per day of interdisciplinary therapy in a comprehensive inpatient rehab setting. Physiatrist is providing close team supervision and 24 hour management of active medical problems listed below. Physiatrist and rehab team continue to assess barriers to discharge/monitor patient progress toward functional and medical goals. FIM: Function - Bathing Position: Wheelchair/chair at sink Body parts bathed by patient: Right arm, Left arm, Chest, Abdomen, Front perineal area, Buttocks, Right upper leg, Left upper leg, Right lower leg, Left lower leg Body parts bathed by helper: Back Assist Level: Touching or steadying assistance(Pt > 75%)  Function- Upper Body Dressing/Undressing What is the patient wearing?: Button up shirt Button up shirt - Perfomed by patient: Thread/unthread right sleeve, Thread/unthread left sleeve, Pull shirt around back Button up shirt - Perfomed by helper: Button/unbutton shirt Assist Level: Touching or steadying assistance(Pt > 75%) Function - Lower Body Dressing/Undressing Lower body dressing/undressing activity did not occur: Refused What is the patient wearing?: Pants, Shoes, Ted Hose Position: Wheelchair/chair at [employer]- Performed by patient: Thread/unthread right pants leg, Thread/unthread left pants leg, Pull pants up/down, Fasten/unfasten pants Pants- Performed by helper: Fasten/unfasten pants Shoes - Performed by patient: Don/doff right shoe, Don/doff left shoe, Fasten right, Fasten left TED Hose - Performed by helper: Don/doff right TED hose, Don/doff left TED hose Assist  Level: Touching or steadying assistance (Pt > 75%)  Function - Toileting Toileting  activity did not occur: No continent bowel/bladder event Toileting steps completed by patient: Adjust clothing prior to toileting, Adjust clothing after toileting, Performs perineal hygiene Toileting steps completed by helper: Adjust clothing after toileting Toileting Assistive Devices: Grab bar or rail Assist level: Supervision or verbal cues, Touching or steadying assistance (Pt.75%)  Function - Air cabin crew transfer activity did not occur: Safety/medical concerns Toilet transfer assistive device: Grab bar Assist level to toilet: Touching or steadying assistance (Pt > 75%) Assist level from toilet: Touching or steadying assistance (Pt > 75%)  Function - Chair/bed transfer Chair/bed transfer method: Ambulatory Chair/bed transfer assist level: Supervision or verbal cues Chair/bed transfer assistive device: Walker Chair/bed transfer details: Verbal cues for technique  Function - Locomotion: Wheelchair Will patient use wheelchair at discharge?: No Function - Locomotion: Ambulation Max distance: 250 ft Assist level: Supervision or verbal cues Assist level: Supervision or verbal cues Assist level: Supervision or verbal cues Assist level: Supervision or verbal cues  Function - Comprehension Comprehension: Auditory Comprehension assist level: Understands basic 90% of the time/cues < 10% of the time  Function - Expression Expression: Verbal Expression assist level: Expresses basic 90% of the time/requires cueing < 10% of the time.  Function - Social Interaction Social Interaction assist level: Interacts appropriately 90% of the time - Needs monitoring or encouragement for participation or interaction.  Function - Problem Solving Problem solving assist level: Solves basic 75 - 89% of the time/requires cueing 10 - 24% of the time  Function - Memory Memory assistive device: Memory book Memory assist level: Recognizes or recalls 75 - 89% of the time/requires cueing 10 - 24%  of the time Patient normally able to recall (first 3 days only): Location of own room, That he or she is in a hospital, Current season, Staff names and faces  Medical Problem List and Plan: 1. Functional deficits secondary to debility after acute respiratory failure and CHF exacerbation s/p CABG.  -better exercise tolerance  -  memory issues improving  -discussed vocational issues. Will speak with Education officer, museum as well. He will not be able to return to work in 20 days as he states he "needs to".     2. DVT Prophylaxis/Anticoagulation: Pharmaceutical: Lovenox due to VTE risk   3. Pain Management: oxycodone prn---observe activity tolerance with therapy 4. Mood: Increase Remeron to home dose to help manage insomnia and mood. Team to provide ego support. LCSW to follow for evaluation and support.  5. Neuropsych: This patient is capable of making decisions on his own behalf. 6. Skin/Wound Care: Routine pressure relief measures. Maintain adequate nutritional intake. Offer supplements between meals.  7. Fluids/Electrolytes/Nutrition: Monitor [email or web address] better 8. CAD s/p CABG: Continue sternal precautions. Continues to have incisional pain but no CP. On ASA, lipitor, coreg and Entresto.  9. Acute on systolic CHF: Monitor daily weights. Low salt diet.   aldactone, coreg, Entestor and digoxin.  -lasix 40mg   - Cardiology following also.no recent med changes   -all labs personally reviewed/stable. Weight around 62kg today again 10 COPD: Avoiding Spiriva with BPH/voiding difficulties. Continue Brovan/pulmicort with prn Xopenex. To be discharged on symbicort 80/4.5 2 puffs bid + prn xopenex  -outpt follow up with pulmonary at dc  -lungs remain generally clear--airflow improving    11. ABLA: Slowly improving.  12. TOI:[id number] improved. Watch sbp  13. Urinary retention/BPH: continue Avodart.  ICP with high volumes, added flomax--at 0.8mg  qpm  -no spont  voids  -urine with odor--ucx with 100k  GNR==continue empiric amoxil  -i/o caths prn 14. Adjustment disorder: Mentation improving gradually.Continue Xanax for chronic anxiety/ adjustment reaction.  15. Reactive leucocytosis: Likely due to steroids/surgery. No signs of infection on exam. Continue to monitor 16. Stress induced hyperglycemia: Hgb A1C- 5.9. Improved    LOS (Days) 17 A FACE TO FACE EVALUATION WAS PERFORMED  Travis Romero T 11/21/2014 8:30 AM

## 2014-11-21 NOTE — Progress Notes (Signed)
Educated pt on I&O cath regimen. Pt refusing to learn how to cath. Pt states, "I will not be able to do this at home. I just can't." RN educated on clean technique and importance of emptying bladder with pt continuing to refuse to listen and learn. Will continue to educate pt.

## 2014-11-21 NOTE — Progress Notes (Signed)
Occupational Therapy Session Note  Patient Details  Name: Travis Romero MRN: [id number] Date of Birth: 09-12-1950  Today's Date: 11/21/2014 OT Individual Time: 0712-1975 OT Individual Time Calculation (min): 75 min    Short Term Goals: Week 3:  OT Short Term Goal 1 (Week 3): STG=LTG due to ELOS  Skilled Therapeutic Interventions/Progress Updates:    Pt engaged in BADL retraining including bathing and dressing with sit<>stand from w/c at sink.  Pt was able to unbutton shirt without assistance this morning and required assistance with fastening only one button.  Pt was able to tie string on pants and tie both shoes this morning.  Pt pleased with his ability to perform these functions without assistance.  Pt stood at sink to doff/don shirt and pull up pants with occasional steady A.  Pt continues to require more than a reasonable amount of time to complete tasks.  Focus on activity tolerance, functional amb with RW for home mgmt tasks, sit<>stand, standing balance, task initiation, and safety awareness.  Therapy Documentation Precautions:  Precautions Precautions: Fall, Sternal Precaution Comments: watch HR  Restrictions Weight Bearing Restrictions: No Pain: Pain Assessment Pain Assessment: No/denies pain Function:   Eating Eating   Eating Assist Level: More than reasonable amount of time           Grooming Oral Care,Brush Teeth, Clean Dentures Activity:      Assist Level: Supervision or verbal cues      Wash, Rinse, Dry Face Activity   Assist Level: Supervision or verbal cues      Wash, Rinse, Dry Hands Activity   Assist Level: Supervision or verbal cues      Brush, Comb Hair Activity   Assist Level: Supervision or verbal cues    Shave Activity          Apply Makeup Activity Apply makeup activity did not occur: Patient does not wear makeup                                                           Bathing Bathing position   Position: Wheelchair/chair at  sink  Bathing parts Body parts bathed by patient: Right arm;Left arm;Chest;Abdomen;Front perineal area;Buttocks;Right upper leg;Left upper leg;Right lower leg;Left lower leg Body parts bathed by helper: Back  Bathing assist Assist Level: Touching or steadying assistance(Pt > 75%)       Upper Body Dressing/Undressing Upper body dressing   What is the patient wearing?: Button up shirt         Button up shirt - Perfomed by patient: Thread/unthread right sleeve;Thread/unthread left sleeve;Pull shirt around back Button up shirt - Perfomed by helper: Button/unbutton shirt    Upper body assist         Lower Body Dressing/Undressing Lower body dressing   What is the patient wearing?: Pants;Shoes;Ted Hose     Pants- Performed by patient: Thread/unthread right pants leg;Thread/unthread left pants leg;Pull pants up/down;Fasten/unfasten pants           Shoes - Performed by patient: Don/doff right shoe;Don/doff left shoe;Fasten right;Fasten left         TED Hose - Performed by helper: Don/doff right TED hose;Don/doff left TED hose  Lower body assist Assist Level: Touching or steadying assistance (Pt > 75%)        Bed Mobility  Lying to sitting activity   Assist level: Supervision or verbal cues  Mobility details     Transfers Sit to stand transfer   Sit to stand assist level: Supervision or verbal cues    Chair/bed transfer   Chair/bed transfer method: Ambulatory Chair/bed transfer assist level: Supervision or verbal cues               Cognition Comprehension Comprehension assist level: Understands basic 90% of the time/cues < 10% of the time  Expression Expression assist level: Expresses basic 90% of the time/requires cueing < 10% of the time.  Social Interaction Social Interaction assist level: Interacts appropriately 90% of the time - Needs monitoring or encouragement for participation or interaction.  Problem Solving Problem solving assist level: Solves  basic 75 - 89% of the time/requires cueing 10 - 24% of the time  Memory Memory assist level: Recognizes or recalls 75 - 89% of the time/requires cueing 10 - 24% of the time    Therapy/Group: Individual Therapy  [name] 11/21/2014, 8:15 AM

## 2014-11-21 NOTE — Progress Notes (Signed)
Physical Therapy Session Note  Patient Details  Name: Travis Romero MRN: [id number] Date of Birth: 02-20-1951  Today's Date: 11/21/2014 PT Individual Time: 0910-1005 PT Individual Time Calculation (min): 55 min   Short Term Goals: Week 3:  PT Short Term Goal 1 (Week 3): =LTG's due to ELOS  Skilled Therapeutic Interventions/Progress Updates:  Session focused on functional mobility and activity tolerance. Pt had incontinent episode of bowel prior to session, assisted pt with steady assist in standing for self hygiene and for thoroughness and to raise shirt.  Pt transferred to toilet with RW and grab bar to dof/don shoes, pants, and brief with assistance. Pt performed hand hygiene with supervision. Pt ambulated with RW 250 feet X 2 with RW and supervision, pt required less cuing for posture and looking up. Pt negotiated 16 six in steps in stairwell using R handrail and mix of step to and reciprocal pattern with supervision and cuing for foot clearance. Pt performed standing ADL activity of making bed (changing sheets, comforter, and pillow cases) in ADL apartment for activity tolerance in more than reasonable amount of time and requiring one rest break and supervision. Pt left in wheelchair in room with all needs in reach.  Therapy Documentation Precautions:  Precautions Precautions: Fall, Sternal Precaution Comments: watch HR  Restrictions Weight Bearing Restrictions: No Pain: Pain Assessment Pain Assessment: 0-10 Pain Score: 6  Pain Type: Chronic pain Pain Location: Generalized Pain Orientation: Mid;Upper Pain Descriptors / Indicators: Aching;Constant Pain Onset: On-going Pain Intervention(s): Repositioned   Function:  Toileting Toileting  Pt needed min assist with steadying and adjusting clothing, and pericare, supervision for transfer to toilet with RW using grab bar.     Bed Mobility Roll left and right activity   Assist level: Supervision or verbal cues  Sit to lying  activity   Assist level: Supervision or verbal cues  Lying to sitting activity   Assist level: Supervision or verbal cues  Mobility details     Transfers Sit to stand transfer   Sit to stand assist level: Supervision or verbal cues Sit to stand assistive device: Walker;Armrests  Chair/bed transfer   Chair/bed transfer method: Ambulatory Chair/bed transfer assist level: Supervision or verbal cues Chair/bed transfer assistive device: Counselling psychologist transfer assistive device: Nurse, children's     Max distance: 250 ft Assist level: Supervision or verbal cues  Walk 10 feet activity   Assist level: Supervision or verbal cues  Walk 50 feet with 2 turns activity   Assist level: Supervision or verbal cues  Walk 150 feet activity   Assist level: Supervision or verbal cues  Walk 10 feet on uneven surfaces activity      Stairs   Stairs assistive device: 1 hand rail Max number of stairs: 16 Stairs assist level: Supervision or verbal cues  Walk up/down 1 step activity     Walk up/down 1 step (curb) assist level: Supervision or verbal cues  Walk up/down 4 steps activity   Walk up/down 4 steps assist level: Supervision or verbal cues  Walk up/down 12 steps activity   Walk up/down 12 steps assist level: Supervision or verbal cues  Pick up small objects from floor      Wheelchair          Wheel 50 feet with 2 turns activity      Wheel 150 feet activity  Cognition Comprehension Comprehension assist level: Understands complex 90% of the time/cues 10% of the time  Expression Expression assist level: Expresses basic 90% of the time/requires cueing < 10% of the time.  Social Interaction Social Interaction assist level: Interacts appropriately 90% of the time - Needs monitoring or encouragement for participation or interaction.  Problem Solving Problem solving assist level: Solves basic 90% of the  time/requires cueing < 10% of the time  Memory Memory assist level: Recognizes or recalls 75 - 89% of the time/requires cueing 10 - 24% of the time    Therapy/Group: Individual Therapy  Elsie Ra 11/21/2014, 12:31 PM

## 2014-11-22 ENCOUNTER — Inpatient Hospital Stay (HOSPITAL_COMMUNITY): Payer: PRIVATE HEALTH INSURANCE | Admitting: Physical Therapy

## 2014-11-22 ENCOUNTER — Inpatient Hospital Stay (HOSPITAL_COMMUNITY): Payer: PRIVATE HEALTH INSURANCE

## 2014-11-22 ENCOUNTER — Inpatient Hospital Stay (HOSPITAL_COMMUNITY): Payer: PRIVATE HEALTH INSURANCE | Admitting: Speech Pathology

## 2014-11-22 DIAGNOSIS — A499 Bacterial infection, unspecified: Secondary | ICD-10-CM

## 2014-11-22 DIAGNOSIS — N390 Urinary tract infection, site not specified: Secondary | ICD-10-CM

## 2014-11-22 MED ORDER — SULFAMETHOXAZOLE-TRIMETHOPRIM 400-80 MG PO TABS
1.0000 | ORAL_TABLET | Freq: Two times a day (BID) | ORAL | Status: DC
  Administered 2014-11-22 – 2014-11-26 (×9): 1 via ORAL
  Filled 2014-11-22 (×12): qty 1

## 2014-11-22 NOTE — Progress Notes (Signed)
Occupational Therapy Session Note  Patient Details  Name: Thompson Mckim MRN: [id number] Date of Birth: [birth year]  Today's Date: 11/22/2014 OT Individual Time: 0700-0800 OT Individual Time Calculation (min): 60 min    Short Term Goals: Week 3:  OT Short Term Goal 1 (Week 3): STG=LTG due to ELOS  Skilled Therapeutic Interventions/Progress Updates:    Pt engaged in BADL retraining including bathing and dressing with sit<>stand at sink.  Pt continues to decline shower.  See function below.  Pt performed shaving and dental hygiene while standing at sink with focus on dynamic standing balance and activity tolerance.  Pt continues to require assistance with fastening buttons on shirt sleeves but is resistant to wearing long sleeve pullover shirts.  Pt declined use of toilet during session.  Pt initiates all tasks and does not require any verbal cues to sequencing.  Pt continues to require more than a reasonable amount of time to complete tasks but does not require any rest breaks.  Focus on activity tolerance, functional amb with RW, sit<>stand, standing balance, task initiation, sequencing, and safety awareness to increased independence with BADLs.  Therapy Documentation Precautions:  Precautions Precautions: Fall, Sternal Precaution Comments: watch HR  Restrictions Weight Bearing Restrictions: No Pain: Pain Assessment Pain Score: 7  Pain Type: Chronic pain Pain Location: Generalized Pain Descriptors / Indicators: Aching Pain Onset: On-going Pain Intervention(s): RN aware Other Treatments:    Function:     Grooming Oral Care,Brush Teeth, Clean Dentures Activity:      Assist Level: More than reasonable amount of time      Wash, Rinse, Dry Face Activity   Assist Level: More than reasonable time      Wash, Rinse, Dry Hands Activity   Assist Level: More than reasonable time      Brush, Comb Hair Activity   Assist Level: More than reasonable time    Shave Activity    Assist Level: Supervision or verbal cues      Apply Makeup Activity Apply makeup activity did not occur: Patient does not wear makeup                                                             Upper Body Dressing/Undressing Upper body dressing   What is the patient wearing?: Button up shirt         Button up shirt - Perfomed by patient: Thread/unthread right sleeve;Thread/unthread left sleeve;Pull shirt around back Button up shirt - Perfomed by helper: Button/unbutton shirt    Upper body assist Assist Level: Touching or steadying assistance(Pt > 75%)       Lower Body Dressing/Undressing Lower body dressing   What is the patient wearing?: Pants     Pants- Performed by patient: Thread/unthread right pants leg;Thread/unthread left pants leg;Pull pants up/down           Shoes - Performed by patient: Don/doff right shoe;Don/doff left shoe;Fasten right;Fasten left            Lower body assist Assist Level: Touching or steadying assistance (Pt > 75%)          Therapy/Group: Individual Therapy  [name] 11/22/2014, 8:00 AM

## 2014-11-22 NOTE — Progress Notes (Signed)
Occupational Therapy Session Note  Patient Details  Name: Travis Romero MRN: [id number] Date of Birth: [birth year]  Today's Date: 11/22/2014 OT Individual Time: 1100-1130 OT Individual Time Calculation (min): 30 min    Short Term Goals: Week 3:  OT Short Term Goal 1 (Week 3): STG=LTG due to ELOS  Skilled Therapeutic Interventions/Progress Updates:    Pt practiced tub bench transfers; see function below.  Pt required more than a reasonable amount of time to complete transfers.  Discussed equipment options and continued discharge planning.  Pt states his sister will be with him for a "few weeks" after discharge.    Therapy Documentation Precautions:  Precautions Precautions: Fall, Sternal Precaution Comments: watch HR  Restrictions Weight Bearing Restrictions: No  Pain: Pain Assessment Pain Assessment: No/denies pain Pain Score: 5   genralized  Function:                                               Transfers          Tub/shower transfer Tub/shower transfer activity did not occur: Simulated Tub/shower assistive device: Tub transfer bench;Grab bars;Walker   Assist level into tub: Supervision or verbal cues Assist level out of tub: Supervision or verbal cues      Therapy/Group: Individual Therapy  [name] 11/22/2014, 12:27 PM

## 2014-11-22 NOTE — Progress Notes (Signed)
[facility] PH[facility]ICAL [facility]ICINE & REHABILITATION     PROGRESS NOTE    Subjective/Complaints:   No new complaints. Appreciative of team's help with FMLA,vocational issues.   ROS:  nausea, vomiting, abdominal pain, diarrhea, chest pain,  palpitations,  Continued urine retention   Objective: Vital Signs: Blood pressure 154/79, pulse 115, temperature 98.2 F (36.8 C), temperature source Oral, resp. rate 20, height 5\' 7"  (1.702 m), weight 61 kg (134 lb 7.7 oz), SpO2 90 %. No results found. No results for input(s): WBC, HGB, HCT, PLT in the last 72 hours. No results for input(s): NA, K, CL, GLUCOSE, BUN, CREATININE, CALCIUM in the last 72 hours.  Invalid input(s): CO CBG (last 3)   Recent Labs  11/19/14 2105 11/20/14 0627 11/20/14 1117  GLUCAP 109* 103* 143*    Wt Readings from Last 3 Encounters:  11/22/14 61 kg (134 lb 7.7 oz)  11/04/14 56.2 kg (123 lb 14.4 oz)  10/07/14 68.493 kg (151 lb)    Physical Exam:  Constitutional: He is oriented to person, place, and time. He appears well-developed and well-nourished.  Frail appearing male  HENT: oral mucosa a little dry. Voice stronger Head: Normocephalic and atraumatic.  Eyes: Conjunctivae are normal. Pupils are equal, round, and reactive to light.  Neck: Normal range of motion. Neck supple. Tenderness over upper traps Cardiovascular: Normal rate and regular rhythm. no murmurs, rubs, gallops Respiratory: Effort normal. Equal BS. He has no wheezes. No distress GI: Soft. Bowel sounds are normal. He exhibits no distension. There is no tenderness.  Musculoskeletal: He exhibits trace tibial edema.Marland Kitchen He exhibits no tenderness.  Neurological: He is alert and oriented to person, place, and time. Reasonable insight and awareness.  Dysphonic speech mild. Able to follow one and two step commands without difficulty.  UE: 4- deltoid, 4 bicep,right  tricep, wrist, hi LUE: tricep   4, wrist 4, HI 3, 4th/5th finger drop. LE: 3 hf, 3+ ke  and 4 adf/apf  Skin: Skin is warm and dry.  Midline sternal incision CDI, mild tenderness Schest wall incision well healed Psychiatric: Thought content generally normal. Pleasant mood.   Assessment/Plan: 1. Functional deficits secondary to debility after CABG and multiple medical  which require 3+ hours per day of interdisciplinary therapy in a comprehensive inpatient rehab setting. Physiatrist is providing close team supervision and 24 hour management of active medical problems listed below. Physiatrist and rehab team continue to assess barriers to discharge/monitor patient progress toward functional and medical goals. FIM: Function - Bathing Position: Wheelchair/chair at sink Body parts bathed by patient: Right arm, Left arm, Chest, Abdomen, Front perineal area, Buttocks, Right upper leg, Left upper leg, Right lower leg, Left lower leg Body parts bathed by helper: Back Assist Level: Touching or steadying assistance(Pt > 75%)  Function- Upper Body Dressing/Undressing What is the patient wearing?: Button up shirt Button up shirt - Perfomed by patient: Thread/unthread right sleeve, Thread/unthread left sleeve, Pull shirt around back Button up shirt - Perfomed by helper: Button/unbutton shirt Assist Level: Touching or steadying assistance(Pt > 75%) Function - Lower Body Dressing/Undressing Lower body dressing/undressing activity did not occur: Refused What is the patient wearing?: Pants Position: Wheelchair/chair at sink Pants- Performed by patient: Thread/unthread right pants leg, Thread/unthread left pants leg, Pull pants up/down Pants- Performed by helper: Fasten/unfasten pants Shoes - Performed by patient: Don/doff right shoe, Don/doff left shoe, Fasten right, Fasten left TED Hose - Performed by helper: Don/doff right TED hose, Don/doff left TED hose Assist Level: Touching or steadying assistance (  Pt > 75%)  Function - Toileting Toileting activity did not occur: No continent  bowel/bladder event Toileting steps completed by patient: Adjust clothing prior to toileting, Adjust clothing after toileting Toileting steps completed by helper: Performs perineal hygiene Toileting Assistive Devices: Grab bar or rail Assist level: Touching or steadying assistance (Pt.75%)  Function - Air cabin crew transfer activity did not occur: Safety/medical concerns Toilet transfer assistive device: Grab bar, Walker Assist level to toilet: Supervision or verbal cues Assist level from toilet: Supervision or verbal cues  Function - Chair/bed transfer Chair/bed transfer method: Ambulatory Chair/bed transfer assist level: Supervision or verbal cues Chair/bed transfer assistive device: Walker Chair/bed transfer details: Verbal cues for technique  Function - Locomotion: Wheelchair Will patient use wheelchair at discharge?: No Function - Locomotion: Ambulation Assistive device: Walker-rolling Max distance: 250 ft Assist level: Supervision or verbal cues Assist level: Supervision or verbal cues Assist level: Supervision or verbal cues Assist level: Supervision or verbal cues  Function - Comprehension Comprehension: Auditory Comprehension assist level: Follows basic conversation/direction with no assist  Function - Expression Expression: Verbal Expression assist level: Expresses basic needs/ideas: With extra time/assistive device  Function - Social Interaction Social Interaction assist level: Interacts appropriately 90% of the time - Needs monitoring or encouragement for participation or interaction.  Function - Problem Solving Problem solving assist level: Solves basic 75 - 89% of the time/requires cueing 10 - 24% of the time  Function - Memory Memory assistive device: Memory book Memory assist level: Recognizes or recalls 75 - 89% of the time/requires cueing 10 - 24% of the time Patient normally able to recall (first 3 days only): Location of own room, That he or  she is in a hospital, Current season, Staff names and faces  Medical Problem List and Plan: 1. Functional deficits secondary to debility after acute respiratory failure and CHF exacerbation s/p CABG.  -better exercise tolerance  -  memory issues improving  -discussed vocational issues. Will speak with Education officer, museum as well. He will not be able to return to work in 20 days as he states he "needs to".     2. DVT Prophylaxis/Anticoagulation: Pharmaceutical: Lovenox due to VTE risk   3. Pain Management: oxycodone prn---observe activity tolerance with therapy 4. Mood: Increase Remeron to home dose to help manage insomnia and mood. Team to provide ego support. LCSW to follow for evaluation and support.  5. Neuropsych: This patient is capable of making decisions on his own behalf. 6. Skin/Wound Care: Routine pressure relief measures. Maintain adequate nutritional intake. Offer supplements between meals.  7. Fluids/Electrolytes/Nutrition: Monitor [email or web address] better 8. CAD s/p CABG: Continue sternal precautions. Continues to have incisional pain but no CP. On ASA, lipitor, coreg and Entresto.  9. Acute on systolic CHF: Monitor daily weights. Low salt diet.   aldactone, coreg, Entestor and digoxin.  -lasix 40mg   - Cardiology following also.no recent med changes   -all labs personally reviewed/stable. Weight around near 61-62kg 10 COPD: Avoiding Spiriva with BPH/voiding difficulties. Continue Brovan/pulmicort with prn Xopenex. To be discharged on symbicort 80/4.5 2 puffs bid + prn xopenex  -outpt follow up with pulmonary at dc  -lungs remain generally clear--airflow improving    11. ABLA: Slowly improving.  12. FWY:OVZCHYIFO improved. Watch sbp  13. Urinary retention/BPH: continue Avodart and flomax--0.8mg  qpm  -beginning to empty a bit, still large pvr's  --ucx with 100k citrobacter fameri---change to bactrim  -i/o caths prn, OOB to void!! 14. Adjustment disorder: Mentation improving  gradually.Continue Xanax for chronic anxiety/  adjustment reaction.  15. Reactive leucocytosis: Likely due to steroids/surgery. No signs of infection on exam. Continue to monitor 16. Stress induced hyperglycemia: Hgb A1C- 5.9. Improved    LOS (Days) 18 A FACE TO FACE EVALUATION WAS PERFORMED  Clothilde Tippetts T 11/22/2014 8:41 AM

## 2014-11-22 NOTE — Progress Notes (Signed)
Nursing Note: No void.Pt in and out cathed per policy.Coude catheter required.Pt tolerated well.wbb

## 2014-11-22 NOTE — Plan of Care (Signed)
Problem: RH BLADDER ELIMINATION Goal: RH STG MANAGE BLADDER WITH EQUIPMENT WITH ASSISTANCE STG Manage Bladder With Equipment With Assistance. Min A  Outcome: Not Progressing I&O caths required

## 2014-11-22 NOTE — Progress Notes (Signed)
Social Work Patient ID: Travis Romero, male   DOB: Aug 27, 1950, 64 y.o.   MRN: [id number]  Spoke with pt, cousin and sister yesterday to review team conference.  All still planning for d/c on 8/23 and sister and cousin to be here on 8/22 for all education.   Sister still to stay with pt for a month to provide 24/7 care.  Pt very focused still on financial concerns and return to work plans.  MD, PA have both addressed these concerns multiple times.  [name], who is handling his financial matters, has also addressed this with pt and attempts to reassure him.  I will begin a SSD application with pt as a "safety net" should he not reach a level of function to return to his job.  Will continue to follow.  Jilliana Burkes, LCSW

## 2014-11-22 NOTE — Progress Notes (Signed)
Physical Therapy Session Note  Patient Details  Name: Travis Romero MRN: [id number] Date of Birth: [birth year]  Today's Date: 11/22/2014 PT Individual Time: 0300-0405 PT Individual Time Calculation (min): 65 min   Short Term Goals: Week 2:  PT Short Term Goal 1 (Week 2): pt will perform standing activity for 10 min with min assist for balance PT Short Term Goal 1 - Progress (Week 2): Met PT Short Term Goal 2 (Week 2): pt will ambulate least restricitive AD X 175 ft with min assist PT Short Term Goal 2 - Progress (Week 2): Met PT Short Term Goal 3 (Week 2): pt will perform stand pivot transfer with no AD with supervision PT Short Term Goal 3 - Progress (Week 2): Met PT Short Term Goal 4 (Week 2): pt will negotiate 14 six inch steps with supervision and fingertip support bilat UE PT Short Term Goal 4 - Progress (Week 2): Met Week 3:  PT Short Term Goal 1 (Week 3): =LTG's due to ELOS  Skilled Therapeutic Interventions/Progress Updates:   Session focused on functional mobility (see below), balance, and D/C planning. Pt improved Berg Balance Assessment to 37/56 from 32/56 on 11/13/14 indicating that pt is at increased risk for falls. Reviewed OTAGO HEP with pt with return demonstration. Pt performed problem solving activity of ordering DME with max cuing and more than reasonable amount of time. Discussed with pt D/C plan including family education with sister scheduled for 11/25/14, D/C date on 8/23, social worker ordering DME, and management of RW. Pt verbalized understanding with no further questions or concerns, pt left in wheelchair with all needs in reach.  Therapy Documentation Precautions:  Precautions Precautions: Fall, Sternal Precaution Comments: watch HR  Restrictions Weight Bearing Restrictions: No Balance: Balance Balance Assessed: Yes Standardized Balance Assessment Standardized Balance Assessment: Berg Balance Test Berg Balance Test Sit to Stand: Able to stand   independently using hands Standing Unsupported: Able to stand 2 minutes with supervision Sitting with Back Unsupported but Feet Supported on Floor or Stool: Able to sit 2 minutes under supervision Stand to Sit: Controls descent by using hands Transfers: Able to transfer with verbal cueing and /or supervision Standing Unsupported with Eyes Closed: Able to stand 10 seconds with supervision Standing Ubsupported with Feet Together: Able to place feet together independently and stand for 1 minute with supervision From Standing, Reach Forward with Outstretched Arm: Can reach confidently >25 cm (10") From Standing Position, Pick up Object from Floor: Able to pick up shoe, needs supervision From Standing Position, Turn to Look Behind Over each Shoulder: Looks behind from both sides and weight shifts well Turn 360 Degrees: Needs close supervision or verbal cueing Standing Unsupported, Alternately Place Feet on Step/Stool: Able to complete 4 steps without aid or supervision Standing Unsupported, One Foot in Front: Able to plae foot ahead of the other independently and hold 30 seconds Standing on One Leg: Unable to try or needs assist to prevent fall Total Score: 37   Function:  Toileting Toileting       Bed Mobility Roll left and right activity      Sit to lying activity      Lying to sitting activity      Mobility details     Transfers Sit to stand transfer   Sit to stand assist level: Supervision or verbal cues Sit to stand assistive device: Walker;Armrests  Chair/bed transfer   Chair/bed transfer method: Ambulatory Chair/bed transfer assist level: Supervision or verbal cues Chair/bed transfer assistive device: Environmental consultant  Chair/bed transfer details: Verbal cues for Chemical engineer transfer assistive device: Publishing rights manager transfer assist level: Supervision or verbal cues    Locomotion Ambulation     Max distance: 250 ft Assist level:  Supervision or verbal cues  Walk 10 feet activity   Assist level: Supervision or verbal cues  Walk 50 feet with 2 turns activity   Assist level: Supervision or verbal cues  Walk 150 feet activity   Assist level: Supervision or verbal cues  Walk 10 feet on uneven surfaces activity      Stairs          Walk up/down 1 step activity        Walk up/down 4 steps activity      Walk up/down 12 steps activity      Pick up small objects from floor   Assist level: Supervision or verbal cues  Wheelchair          Wheel 50 feet with 2 turns activity      Wheel 150 feet activity       Cognition Comprehension Comprehension assist level: Follows basic conversation/direction with no assist  Expression Expression assist level: Expresses basic needs/ideas: With extra time/assistive device  Social Interaction Social Interaction assist level: Interacts appropriately with others with medication or extra time (anti-anxiety, antidepressant).  Problem Solving Problem solving assist level: Solves basic 75 - 89% of the time/requires cueing 10 - 24% of the time  Memory Memory assist level: Recognizes or recalls 50 - 74% of the time/requires cueing 25 - 49% of the time    Therapy/Group: Individual Therapy  Elsie Ra 11/22/2014, 4:34 PM

## 2014-11-22 NOTE — Progress Notes (Signed)
Speech Language Pathology Daily Session Note  Patient Details  Name: Travis Romero MRN: [id number] Date of Birth: [birth year]  Today's Date: 11/22/2014 SLP Individual Time: 0810-0900 SLP Individual Time Calculation (min): 50 min and Today's Date: 11/22/2014 SLP Missed Time: 10 Minutes Missed Time Reason: Nursing care  Short Term Goals: Week 3: SLP Short Term Goal 1 (Week 3): Patient will utilize external memory aids to recall new, daily information with supervision multimodal cues. SLP Short Term Goal 2 (Week 3): Patient will demonstrate selective attention to a functional task for 60 minutes with supervision verbal cues for redirection.  SLP Short Term Goal 3 (Week 3): Patient will self-monitor and correct errors with functional tasks with supervision verbal and question cues.  SLP Short Term Goal 4 (Week 3): Patient will utilize an increased vocal intensity at the sentence level with Mod I to increase intelligibility to 100%.  SLP Short Term Goal 5 (Week 3): Patient will demonstrate functional problem for mildly complex tasks with supervision verbal cues.   Skilled Therapeutic Interventions: Skilled treatment session focused on cognitive goals. Upon arrival, patient was receiving RN care, therefore, missed initial 10 minutes of session. Patient utilized external memory aids with Mod I for recall of events from morning therapy sessions and for recall of the time his PRN medications were administered.  Patient demonstrated divided attention during a mildly complex conversation in regards to d/c planning while consuming breakfast for 45 minutes with Mod I and required Mod A question cues for anticipatory awareness. Patient was 100% intelligible at the conversation level with Mod I. Patient left upright in wheelchair with quick release belt in place and all needs within reach. Continue with current plan of care.   Function:  Eating Eating   Modified Consistency Diet: No Eating Assist Level:  More than reasonable amount of time           Cognition Comprehension Comprehension assist level: Follows basic conversation/direction with no assist  Expression   Expression assist level: Expresses basic needs/ideas: With extra time/assistive device  Social Interaction Social Interaction assist level: Interacts appropriately 90% of the time - Needs monitoring or encouragement for participation or interaction.  Problem Solving Problem solving assist level: Solves basic 75 - 89% of the time/requires cueing 10 - 24% of the time  Memory Memory assist level: Recognizes or recalls 75 - 89% of the time/requires cueing 10 - 24% of the time    Pain Pain Assessment Pain Assessment: No/denies pain   Therapy/Group: Individual Therapy  Travis Romero 11/22/2014, 12:23 PM

## 2014-11-22 NOTE — Progress Notes (Signed)
Had long discussion with patient about causes of retention as well as options at this time. In and out caths are painful and likely due to UTI. He is petrified that he will hurt himself with "the high tech" catheter and the thought of self cath is akin to "stabbing himself with a knife"  We agreed on indwelling foley at discharge. His sister [name] is familiar with foley and can help him manage/handle it.  She will here for family education on Monday and we will review this with her and advised that we will have [place] for support/assistance after discharge.

## 2014-11-23 ENCOUNTER — Inpatient Hospital Stay (HOSPITAL_COMMUNITY): Payer: PRIVATE HEALTH INSURANCE | Admitting: Occupational Therapy

## 2014-11-23 DIAGNOSIS — M545 Low back pain: Secondary | ICD-10-CM

## 2014-11-23 DIAGNOSIS — J410 Simple chronic bronchitis: Secondary | ICD-10-CM

## 2014-11-23 NOTE — Progress Notes (Signed)
[facility] PHYSICAL MEDICINE & REHABILITATION     PROGRESS NOTE    Subjective/Complaints:  Mid back pain, no recent falls or injury No radiation to legs, no numbness in feet, no increased LE weakness Treated for lumbar disc before, no hx surgery Has been tolerating PT  ROS:  nausea, vomiting, abdominal pain, diarrhea, chest pain,  palpitations,  Continued urine retention   Objective: Vital Signs: Blood pressure 126/64, pulse 93, temperature 97.7 F (36.5 C), temperature source Oral, resp. rate 18, height 5\' 7"  (1.702 m), weight 61 kg (134 lb 7.7 oz), SpO2 97 %. No results found. No results for input(s): WBC, HGB, HCT, PLT in the last 72 hours. No results for input(s): NA, K, CL, GLUCOSE, BUN, CREATININE, CALCIUM in the last 72 hours.  Invalid input(s): CO CBG (last 3)   Recent Labs  11/20/14 1117  GLUCAP 143*    Wt Readings from Last 3 Encounters:  11/22/14 61 kg (134 lb 7.7 oz)  11/04/14 56.2 kg (123 lb 14.4 oz)  10/07/14 68.493 kg (151 lb)    Physical Exam:  Constitutional: He is oriented to person, place, and time. He appears well-developed and well-nourished.  Frail appearing male  HENT: oral mucosa a little dry. Voice stronger Head: Normocephalic and atraumatic.  Eyes: Conjunctivae are normal. Pupils are equal, round, and reactive to light.  Neck: Normal range of motion. Neck supple. Tenderness over upper traps Cardiovascular: Normal rate and regular rhythm. no murmurs, rubs, gallops Respiratory: Effort normal. Equal BS. He has no wheezes. No distress GI: Soft. Bowel sounds are normal. He exhibits no distension. There is no tenderness.  Musculoskeletal: He exhibits trace tibial edema.Marland Kitchen He exhibits no tenderness.  Neurological: He is alert and oriented to person, place, and time. Reasonable insight and awareness.  Dysphonic speech mild. Able to follow one and two step commands without difficulty.  UE: 4- deltoid, 4 bicep,right  tricep, wrist, hi LUE:  tricep   4, wrist 4, HI 3, 4th/5th finger drop. LE: 3 hf, 3+ ke and 4 adf/apf  Skin: Skin is warm and dry.  Midline sternal incision CDI, mild tenderness Schest wall incision well healed Psychiatric: Thought content generally normal. Pleasant mood.   Assessment/Plan: 1. Functional deficits secondary to debility after CABG and multiple medical  which require 3+ hours per day of interdisciplinary therapy in a comprehensive inpatient rehab setting. Physiatrist is providing close team supervision and 24 hour management of active medical problems listed below. Physiatrist and rehab team continue to assess barriers to discharge/monitor patient progress toward functional and medical goals. FIM: Function - Bathing Position: Wheelchair/chair at sink Body parts bathed by patient: Right arm, Left arm, Chest, Abdomen, Front perineal area, Buttocks, Right upper leg, Left upper leg, Right lower leg, Left lower leg Body parts bathed by helper: Back Assist Level: Touching or steadying assistance(Pt > 75%)  Function- Upper Body Dressing/Undressing What is the patient wearing?: Button up shirt Button up shirt - Perfomed by patient: Thread/unthread right sleeve, Thread/unthread left sleeve, Pull shirt around back Button up shirt - Perfomed by helper: Button/unbutton shirt Assist Level: Touching or steadying assistance(Pt > 75%) Function - Lower Body Dressing/Undressing Lower body dressing/undressing activity did not occur: Refused What is the patient wearing?: Pants Position: Wheelchair/chair at sink Pants- Performed by patient: Thread/unthread right pants leg, Thread/unthread left pants leg, Pull pants up/down Pants- Performed by helper: Fasten/unfasten pants Shoes - Performed by patient: Don/doff right shoe, Don/doff left shoe, Fasten right, Fasten left TED Hose - Performed by helper:  Don/doff right TED hose, Don/doff left TED hose Assist Level: Touching or steadying assistance (Pt > 75%)  Function  - Toileting Toileting activity did not occur: No continent bowel/bladder event Toileting steps completed by patient: Adjust clothing prior to toileting Toileting steps completed by helper: Performs perineal hygiene, Adjust clothing after toileting Toileting Assistive Devices: Grab bar or rail Assist level: Touching or steadying assistance (Pt.75%)  Function - Air cabin crew transfer activity did not occur: Safety/medical concerns Toilet transfer assistive device: Walker, Grab bar Assist level to toilet: Touching or steadying assistance (Pt > 75%) Assist level from toilet: Supervision or verbal cues  Function - Chair/bed transfer Chair/bed transfer method: Ambulatory Chair/bed transfer assist level: Supervision or verbal cues Chair/bed transfer assistive device: Walker Chair/bed transfer details: Verbal cues for technique  Function - Locomotion: Wheelchair Will patient use wheelchair at discharge?: No Function - Locomotion: Ambulation Assistive device: Walker-rolling Max distance: 250 ft Assist level: Supervision or verbal cues Assist level: Supervision or verbal cues Assist level: Supervision or verbal cues Assist level: Supervision or verbal cues  Function - Comprehension Comprehension: Auditory Comprehension assist level: Follows basic conversation/direction with no assist  Function - Expression Expression: Verbal Expression assist level: Expresses basic needs/ideas: With extra time/assistive device  Function - Social Interaction Social Interaction assist level: Interacts appropriately with others with medication or extra time (anti-anxiety, antidepressant).  Function - Problem Solving Problem solving assist level: Solves basic 75 - 89% of the time/requires cueing 10 - 24% of the time  Function - Memory Memory assistive device: Memory book (pt writes down all meds, times, and education) Memory assist level: Recognizes or recalls 50 - 74% of the time/requires  cueing 25 - 49% of the time Patient normally able to recall (first 3 days only): Location of own room, That he or she is in a hospital, Current season, Staff names and faces  Medical Problem List and Plan: 1. Functional deficits secondary to debility after acute respiratory failure and CHF exacerbation s/p CABG.  -better exercise tolerance  -  memory issues improving      2. DVT Prophylaxis/Anticoagulation: Pharmaceutical: Lovenox due to VTE risk   3. Pain Management: oxycodone prn---observe activity tolerance with therapy- Back pain upper lumbar hx DDD, will add sportscreme, rec Kpad but pt states it is "too hot"4. Mood: Increase Remeron to home dose to help manage insomnia and mood. Team to provide ego support. LCSW to follow for evaluation and support.  5. Neuropsych: This patient is capable of making decisions on his own behalf. 6. Skin/Wound Care: Routine pressure relief measures. Maintain adequate nutritional intake. Offer supplements between meals.  7. Fluids/Electrolytes/Nutrition: Monitor [email or web address] , 100% meal intake 8. CAD s/p CABG: Continue sternal precautions. Continues to have incisional pain but no CP. On ASA, lipitor, coreg and Entresto.  9. Acute on systolic CHF: Monitor daily weights. Low salt diet.   aldactone, coreg, Entestor and digoxin.  -lasix 40mg   - Cardiology following also.no recent med changes   -. Weight around near 61-62kg 10 COPD: Avoiding Spiriva with BPH/voiding difficulties. Continue Brovan/pulmicort with prn Xopenex. To be discharged on symbicort 80/4.5 2 puffs bid + prn xopenex  -outpt follow up with pulmonary at dc  -lungs remain generally clear--airflow improving    11. ABLA: Slowly improving.  12. UEK:CMKLKJZPH improved.  sbp 126 this am,  13. Urinary retention/BPH: continue Avodart and flomax--0.8mg  qpm  -beginning to empty a bit, still large pvr's  --ucx with 100k citrobacter fameri---sens to bactrim  -i/o caths prn, OOB  to void!!  If  no sig improvement , home with foley 14. Adjustment disorder: Mentation improving gradually.Continue Xanax for chronic anxiety/ adjustment reaction.  15. Reactive leucocytosis: Likely due to steroids/surgery. No signs of infection on exam. Continue to monitor 16. Stress induced hyperglycemia: Hgb A1C- 5.9. Improved    LOS (Days) 19 A FACE TO FACE EVALUATION WAS PERFORMED  Charlett Blake 11/23/2014 6:33 AM

## 2014-11-23 NOTE — Progress Notes (Signed)
Occupational Therapy Session Note  Patient Details  Name: Travis Romero MRN: [id number] Date of Birth: November 24, 1950  Today's Date: 11/23/2014 OT Individual Time: 4496-7591 OT Individual Time Calculation (min): 100 min    Short Term Goals: Week 3:  OT Short Term Goal 1 (Week 3): STG=LTG due to ELOS  Skilled Therapeutic Interventions/Progress Updates:   Patient seen this afternoon for occupational therapy for continued BADL retraining.  Refused shower but agreeable to sponge bath wheelchair level at the sink.  Patient requires verbal cues 50% of the time for sequencing secondary to decreased memory.  Educated on sternal precautions and completing sit <> stand transfers using rocking motion.  Patient ambulates with RW for steadying assist with SBA from therapist throughout room to gather clothing and bring to sink for sponge bath.  Patient requires more than reasonable amount of time to complete all ADL tasks today but is able to wash 10/10 body parts with assist for back.  Min rest breaks required.  Patient able to don open front shirt with only assist for collar buttons today.  Patient completes LB dressing with assist for TED hose.  Dependent for doffing/donning of adult brief.  Crosses legs over opposite knees to reach feet for donning/tying shoes.  Patient shaves in standing position at sink with supervision/SBA.  Patient educated on postural control, energy conservation, and safety at home.  Patient reported sister would be assisting at discharge; educated patient to have sister assist with application of TED hose upon discharge.     Therapy Documentation Precautions:  Precautions Precautions: Fall, Sternal Precaution Comments: watch HR  Restrictions Weight Bearing Restrictions: No Vital Signs: Oxygen 100% Heart Rate 98 Pain: Pain Assessment Pain Assessment: 0-10 Pain Score: 6  Pain Type: Chronic pain Pain Location: Back Pain Descriptors / Indicators: Aching Pain Onset:  On-going Pain Intervention(s): Medication (See eMAR)  Function:   Grooming Oral Care,Brush Teeth, Clean Dentures Activity:             Wash, Rinse, Dry Face Activity   Assist Level: More than reasonable time      Wash, Rinse, Dry Hands Activity   Assist Level: More than reasonable time      Brush, Comb Hair Activity   Assist Level: More than reasonable time    Shave Activity   Assist Level: More than reasonable time;Supervision or verbal cues      Apply Makeup Activity                                                             Bathing Bathing position   Position: Wheelchair/chair at sink  Bathing parts Body parts bathed by patient: Right arm;Left arm;Chest;Abdomen;Front perineal area;Buttocks;Right upper leg;Left upper leg;Right lower leg;Left lower leg Body parts bathed by helper: Back  Bathing assist Assist Level: Touching or steadying assistance(Pt > 75%)       Upper Body Dressing/Undressing Upper body dressing   What is the patient wearing?: Button up shirt         Button up shirt - Perfomed by patient: Thread/unthread right sleeve;Thread/unthread left sleeve;Pull shirt around back;Button/unbutton shirt Button up shirt - Perfomed by helper: Button/unbutton shirt (collar buttons only; able to complete all other buttons today)    Upper body assist Assist Level: Touching or steadying assistance(Pt > 75%)  Lower Body Dressing/Undressing Lower body dressing   What is the patient wearing?: Pants;Socks;Shoes;Ted Hose     Pants- Performed by patient: Pull pants up/down;Fasten/unfasten pants       Socks - Performed by patient: Don/doff right sock;Don/doff left sock   Shoes - Performed by patient: Don/doff right shoe;Don/doff left shoe;Fasten right;Fasten left         TED Hose - Performed by helper: Don/doff right TED hose;Don/doff left TED hose  Lower body assist Assist Level: Touching or steadying assistance (Pt > 75%)       Transfers Sit  to stand transfer   Sit to stand assist level: Supervision or verbal cues Sit to stand assistive device: Walker  Chair/bed Nature conservation officer transfer             Cognition Comprehension Comprehension assist level: Follows complex conversation/direction with no assist  Expression Expression assist level: Expresses complex ideas: With extra time/assistive device  Social Interaction Social Interaction assist level: Interacts appropriately with others with medication or extra time (anti-anxiety, antidepressant).  Problem Solving Problem solving assist level: Solves basic 75 - 89% of the time/requires cueing 10 - 24% of the time  Memory Memory assist level: Recognizes or recalls 50 - 74% of the time/requires cueing 25 - 49% of the time    Therapy/Group: Individual Therapy  Osa Craver 11/23/2014, 3:04 PM

## 2014-11-23 NOTE — Plan of Care (Signed)
Problem: RH BLADDER ELIMINATION Goal: RH STG MANAGE BLADDER WITH EQUIPMENT WITH ASSISTANCE STG Manage Bladder With Equipment With Assistance. Min A  Outcome: Not Progressing Requiring I&O cath

## 2014-11-24 ENCOUNTER — Inpatient Hospital Stay (HOSPITAL_COMMUNITY): Payer: PRIVATE HEALTH INSURANCE | Admitting: Occupational Therapy

## 2014-11-24 NOTE — Progress Notes (Signed)
Occupational Therapy Session Note  Patient Details  Name: Travis Romero MRN: [id number] Date of Birth: [birth year]  Today's Date: 11/24/2014 OT Individual Time: 2641-5830 OT Individual Time Calculation (min): 45 min       Short Term Goals: Week 1:  OT Short Term Goal 1 (Week 1): Pt will complete UB dressing with min A to increase functional independence.  OT Short Term Goal 1 - Progress (Week 1): Met OT Short Term Goal 2 (Week 1): Pt will complete LB dressing with mod A sit<>stand to increase functional independence.  OT Short Term Goal 2 - Progress (Week 1): Progressing toward goal OT Short Term Goal 3 (Week 1): Pt will complete functional transfers with mod A.  OT Short Term Goal 3 - Progress (Week 1): Met OT Short Term Goal 4 (Week 1): Pt will complete functional activity in standing for 30s with mod A.  OT Short Term Goal 4 - Progress (Week 1): Met Week 2:  OT Short Term Goal 1 (Week 2): Pt will complete LB dressing with mod A sit<>stand to increase functional independence.  OT Short Term Goal 2 (Week 2): Pt will complete UB dressing with supervision OT Short Term Goal 2 - Progress (Week 2): Progressing toward goal OT Short Term Goal 3 (Week 2): Pt will complete bathing tasks with min A OT Short Term Goal 3 - Progress (Week 2): Met OT Short Term Goal 4 (Week 2): Pt will perform toilet transfers with mod A OT Short Term Goal 4 - Progress (Week 2): Met  Skilled Therapeutic Interventions/Progress Updates:    Addressed LUE NMRE with functional tasks.  Pt. Engaged in donning shoes with no assistance.  Tied left shoe at independent level and increased time with right shoe and min assist.  Pt. Ambulated to sink.  Performed standing for sponge bath for UB and peri area.  Did not wash Legs per pt choice.  Did teeth in sitting at wc level.  Pt. Engaged and focused on doing self care as independently as possible.  Utilized LUE as active assist during session.    Therapy  Documentation Precautions:  Precautions Precautions: Fall, Sternal Precaution Comments: watch HR  Restrictions Weight Bearing Restrictions: No General:   Vital Signs:  Pain: Pain Assessment Pain Score: 4  Pain Type: Chronic pain Pain Location: Back Pain Orientation: Upper;Mid Pain Descriptors / Indicators: Aching Pain Frequency: Constant Pain Onset: On-going Pain Intervention(s): Medication (See eMAR) ADL:   Exercises:   Other Treatments:    Function:   Eating Eating   Eating Assist Level: More than reasonable amount of time   Eating Set Up Assist For: Opening containers       Grooming Oral Care,Brush Teeth, Clean Dentures Activity:             Wash, Rinse, Dry Face Activity          Wash, Rinse, Dry Hands Activity          Brush, Comb Hair Activity        Shave Activity          Apply Makeup Activity                                                             Bathing Bathing position   Position: Wheelchair/chair at sink  Bathing parts Body parts bathed by patient: Right arm;Left arm;Chest;Abdomen;Front perineal area;Buttocks;Right upper leg;Left upper leg;Right lower leg;Left lower leg Body parts bathed by helper: Back  Bathing assist Assist Level: Touching or steadying assistance(Pt > 75%)       Upper Body Dressing/Undressing Upper body dressing   What is the patient wearing?: Button up shirt         Button up shirt - Perfomed by patient: Thread/unthread right sleeve;Thread/unthread left sleeve;Pull shirt around back;Button/unbutton shirt Button up shirt - Perfomed by helper: Button/unbutton shirt    Upper body assist Assist Level: Touching or steadying assistance(Pt > 75%)       Lower Body Dressing/Undressing Lower body dressing   What is the patient wearing?: Pants;Socks;Shoes;Ted Hose     Pants- Performed by patient: Pull pants up/down;Fasten/unfasten pants;Thread/unthread left pants leg;Thread/unthread right pants leg          Socks - Performed by helper: Don/doff right sock;Don/doff left sock Shoes - Performed by patient: Don/doff right shoe;Don/doff left shoe         TED Hose - Performed by helper: Don/doff right TED hose;Don/doff left TED hose  Lower body assist Assist Level: Touching or steadying assistance (Pt > 75%)       Toileting Toileting   Toileting steps completed by patient: Performs perineal hygiene Toileting steps completed by helper: Adjust clothing prior to toileting;Adjust clothing after toileting Toileting Assistive Devices: Grab bar or rail  Toileting assist Assist level: More than reasonable time;Touching or steadying assistance (Pt.75%)    Bed Mobility Roll left and right activity      Sit to lying activity      Lying to sitting activity      Mobility details     Transfers Sit to stand transfer        Chair/bed transfer   Chair/bed transfer method: Squat pivot Chair/bed transfer assist level: Touching or steadying assistance (Pt > 75%) Chair/bed transfer assistive device: Walker   Chair/bed transfer details: Verbal cues for technique;Verbal cues for sequencing  Toilet transfer Toilet transfer activity did not occur: Safety/medical concerns Toilet transfer assistive device: Grab bar       Assist level to toilet: Touching or steadying assistance (Pt > 75%) Assist level from toilet: Touching or steadying assistance (Pt > 75%)  Tub/shower transfer             Cognition Comprehension Comprehension assist level: Follows complex conversation/direction with no assist  Expression Expression assist level: Expresses complex ideas: With extra time/assistive device  Social Interaction Social Interaction assist level: Interacts appropriately with others - No medications needed.;Interacts appropriately with others with medication or extra time (anti-anxiety, antidepressant).  Problem Solving Problem solving assist level: Solves basic 75 - 89% of the time/requires  cueing 10 - 24% of the time  Memory Memory assist level: Recognizes or recalls 25 - 49% of the time/requires cueing 50 - 75% of the time    Therapy/Group: Individual Therapy  Lisa Roca 11/24/2014, 6:58 PM

## 2014-11-24 NOTE — Progress Notes (Signed)
[place] PHYSICAL MEDICINE & REHABILITATION     PROGRESS NOTE    Subjective/Complaints: Back pain improved with oxycodone  ROS:  nausea, vomiting, abdominal pain, diarrhea, chest pain,  palpitations,  Continued urine retention   Objective: Vital Signs: Blood pressure 159/92, pulse 103, temperature 97.7 F (36.5 C), temperature source Oral, resp. rate 20, height 5\' 7"  (1.702 m), weight 61 kg (134 lb 7.7 oz), SpO2 95 %. No results found. No results for input(s): WBC, HGB, HCT, PLT in the last 72 hours. No results for input(s): NA, K, CL, GLUCOSE, BUN, CREATININE, CALCIUM in the last 72 hours.  Invalid input(s): CO CBG (last 3)  No results for input(s): GLUCAP in the last 72 hours.  Wt Readings from Last 3 Encounters:  11/24/14 61 kg (134 lb 7.7 oz)  11/04/14 56.2 kg (123 lb 14.4 oz)  10/07/14 68.493 kg (151 lb)    Physical Exam:  Constitutional: He is oriented to person, place, and time. He appears well-developed and well-nourished.  Frail appearing male  HENT: oral mucosa a little dry. Voice stronger Head: Normocephalic and atraumatic.  Eyes: Conjunctivae are normal. Pupils are equal, round, and reactive to light.  Neck: Normal range of motion. Neck supple. Tenderness over upper traps Cardiovascular: Normal rate and regular rhythm. no murmurs, rubs, gallops Respiratory: Effort normal. Equal BS. He has no wheezes. No distress GI: Soft. Bowel sounds are normal. He exhibits no distension. There is no tenderness.  Musculoskeletal: He exhibits trace tibial edema.Marland Kitchen He exhibits no tenderness.  Neurological: He is alert and oriented to person, place, and time. Reasonable insight and awareness.  Dysphonic speech mild. Able to follow one and two step commands without difficulty.  UE: 4- deltoid, 4 bicep,right  tricep, wrist, hi LUE: tricep   4, wrist 4, HI 3, 4th/5th finger drop. LE: 3 hf, 3+ ke and 4 adf/apf  Skin: Skin is warm and dry.  Midline sternal incision CDI,  mild tenderness Schest wall incision well healed Psychiatric: Thought content generally normal. Pleasant mood.   Assessment/Plan: 1. Functional deficits secondary to debility after CABG and multiple medical  which require 3+ hours per day of interdisciplinary therapy in a comprehensive inpatient rehab setting. Physiatrist is providing close team supervision and 24 hour management of active medical problems listed below. Physiatrist and rehab team continue to assess barriers to discharge/monitor patient progress toward functional and medical goals. FIM: Function - Bathing Position: Wheelchair/chair at sink Body parts bathed by patient: Right arm, Left arm, Chest, Abdomen, Front perineal area, Buttocks, Right upper leg, Left upper leg, Right lower leg, Left lower leg Body parts bathed by helper: Back Assist Level: Touching or steadying assistance(Pt > 75%)  Function- Upper Body Dressing/Undressing What is the patient wearing?: Button up shirt Button up shirt - Perfomed by patient: Thread/unthread right sleeve, Thread/unthread left sleeve, Pull shirt around back, Button/unbutton shirt Button up shirt - Perfomed by helper: Button/unbutton shirt (collar buttons only; able to complete all other buttons today) Assist Level: Touching or steadying assistance(Pt > 75%) Function - Lower Body Dressing/Undressing Lower body dressing/undressing activity did not occur: Refused What is the patient wearing?: Pants, Socks, Shoes, Ted Hose Position: Wheelchair/chair at [employer]- Performed by patient: Pull pants up/down, Fasten/unfasten pants Pants- Performed by helper: Fasten/unfasten pants Socks - Performed by patient: Don/doff right sock, Don/doff left sock Shoes - Performed by patient: Don/doff right shoe, Don/doff left shoe, Fasten right, Fasten left TED Hose - Performed by helper: Don/doff right TED hose, Don/doff left TED hose  Assist Level: Touching or steadying assistance (Pt > 75%)  Function -  Toileting Toileting activity did not occur: No continent bowel/bladder event Toileting steps completed by patient: Adjust clothing prior to toileting Toileting steps completed by helper: Performs perineal hygiene, Adjust clothing after toileting Toileting Assistive Devices: Grab bar or rail Assist level: Touching or steadying assistance (Pt.75%)  Function - Air cabin crew transfer activity did not occur: Safety/medical concerns Toilet transfer assistive device: Grab bar, Walker Assist level to toilet: Touching or steadying assistance (Pt > 75%) Assist level from toilet: Supervision or verbal cues  Function - Chair/bed transfer Chair/bed transfer method: Ambulatory Chair/bed transfer assist level: Supervision or verbal cues Chair/bed transfer assistive device: Walker Chair/bed transfer details: Verbal cues for technique  Function - Locomotion: Wheelchair Will patient use wheelchair at discharge?: No Function - Locomotion: Ambulation Assistive device: Walker-rolling Max distance: 25 Assist level: Supervision or verbal cues Assist level: Supervision or verbal cues Assist level: Supervision or verbal cues Assist level: Supervision or verbal cues  Function - Comprehension Comprehension: Auditory Comprehension assist level: Follows complex conversation/direction with no assist  Function - Expression Expression: Verbal Expression assist level: Expresses complex ideas: With extra time/assistive device  Function - Social Interaction Social Interaction assist level: Interacts appropriately with others with medication or extra time (anti-anxiety, antidepressant).  Function - Problem Solving Problem solving assist level: Solves basic 75 - 89% of the time/requires cueing 10 - 24% of the time  Function - Memory Memory assistive device: Memory book Memory assist level: Recognizes or recalls 50 - 74% of the time/requires cueing 25 - 49% of the time Patient normally able to  recall (first 3 days only): Current season, Location of own room, Staff names and faces, That he or she is in a hospital  Medical Problem List and Plan: 1. Functional deficits secondary to debility after acute respiratory failure and CHF exacerbation s/p CABG.  -better exercise tolerance  -  memory issues improving      2. DVT Prophylaxis/Anticoagulation: Pharmaceutical: Lovenox due to VTE risk   3. Pain Management: oxycodone prn---observe activity tolerance with therapy- Back pain upper lumbar hx DDD, will add sportscreme, rec Kpad but pt states it is "too hot"4. Mood: Increase Remeron to home dose to help manage insomnia and mood. Team to provide ego support. LCSW to follow for evaluation and support.  5. Neuropsych: This patient is capable of making decisions on his own behalf. 6. Skin/Wound Care: Routine pressure relief measures. Maintain adequate nutritional intake. Offer supplements between meals.  7. Fluids/Electrolytes/Nutrition: Monitor [email or web address] , 100% meal intake 8. CAD s/p CABG: Continue sternal precautions. Continues to have incisional pain but no CP. On ASA, lipitor, coreg and Entresto.  9. Acute on systolic CHF: Monitor daily weights. Low salt diet.   aldactone, coreg, Entestor and digoxin.729ml fluid on 8/20  -lasix 40mg   - Cardiology following also.no recent med changes   -. Weight around near 61-62kg, has been stable recently 10 COPD: Avoiding Spiriva with BPH/voiding difficulties. Continue Brovan/pulmicort with prn Xopenex. To be discharged on symbicort 80/4.5 2 puffs bid + prn xopenex  -outpt follow up with pulmonary at dc  -lungs remain generally clear--airflow improving    11. ABLA: Slowly improving.  12. FUX:NATFTDDUK improved. 159/92 this am monitor for pattern 13. Urinary retention/BPH: continue Avodart and flomax--0.8mg  qpm  -beginning to empty a bit, still large pvr's, ICP 587ml this am  --ucx with 100k citrobacter fameri---sens to bactrim  -i/o caths  prn, OOB to void!!  If no  sig improvement , home with foley 14. Adjustment disorder: Mentation improving gradually.Continue Xanax for chronic anxiety/ adjustment reaction.  15. Reactive leucocytosis: Likely due to steroids/surgery. No signs of infection on exam. Continue to monitor 16. Stress induced hyperglycemia: Hgb A1C- 5.9. Improved    LOS (Days) 20 A FACE TO FACE EVALUATION WAS PERFORMED  Charlett Blake 11/24/2014 10:34 AM

## 2014-11-25 ENCOUNTER — Inpatient Hospital Stay (HOSPITAL_COMMUNITY): Payer: PRIVATE HEALTH INSURANCE | Admitting: Speech Pathology

## 2014-11-25 ENCOUNTER — Inpatient Hospital Stay (HOSPITAL_COMMUNITY): Payer: PRIVATE HEALTH INSURANCE

## 2014-11-25 ENCOUNTER — Inpatient Hospital Stay (HOSPITAL_COMMUNITY): Payer: PRIVATE HEALTH INSURANCE | Admitting: Occupational Therapy

## 2014-11-25 LAB — BASIC METABOLIC PANEL
Anion gap: 6 (ref 5–15)
BUN: 24 mg/dL — ABNORMAL HIGH (ref 6–20)
CALCIUM: 8.7 mg/dL — AB (ref 8.9–10.3)
CHLORIDE: 99 mmol/L — AB (ref 101–111)
CO2: 27 mmol/L (ref 22–32)
CREATININE: 1.31 mg/dL — AB (ref 0.61–1.24)
GFR calc non Af Amer: 56 mL/min — ABNORMAL LOW (ref >60)
Glucose, Bld: 91 mg/dL (ref 65–99)
Potassium: 4.3 mmol/L (ref 3.5–5.1)
SODIUM: 132 mmol/L — AB (ref 135–145)

## 2014-11-25 LAB — CBC
HCT: 32.7 % — ABNORMAL LOW (ref 39.0–52.0)
Hemoglobin: 10.4 g/dL — ABNORMAL LOW (ref 13.0–17.0)
MCH: 32.3 pg (ref 26.0–34.0)
MCHC: 31.8 g/dL (ref 30.0–36.0)
MCV: 101.6 fL — AB (ref 78.0–100.0)
PLATELETS: 291 10*3/uL (ref 150–400)
RBC: 3.22 MIL/uL — AB (ref 4.22–5.81)
RDW: 14.2 % (ref 11.5–15.5)
WBC: 6.9 10*3/uL (ref 4.0–10.5)

## 2014-11-25 NOTE — Progress Notes (Signed)
Physical Therapy Discharge Summary  Patient Details  Name: Travis Romero MRN: [id number] Date of Birth: 17-Nov-1950  Today's Date: 11/25/2014 PT Individual Time: 1000-1104 PT Individual Time Calculation (min): 64 min    Patient has met 9 of 9 long term goals due to improved activity tolerance, improved balance, improved postural control, increased strength, ability to compensate for deficits and improved awareness.  Patient to discharge at an ambulatory level Supervision.   Patient's care partner is independent to provide the necessary physical and cognitive assistance at discharge.  Reasons goals not met: N/A, all goals met  Recommendation:  Patient will benefit from ongoing skilled PT services in home health setting to continue to advance safe functional mobility, address ongoing impairments in cognition, safety, balance and endurance, and minimize fall risk.  Equipment: Rolling walker and tub seat  Reasons for discharge: treatment goals met  Patient/family agrees with progress made and goals achieved: Yes  PT Discharge Precautions/Restrictions Precautions Precautions: Fall;Sternal Vital Signs Therapy Vitals Temp: 97.9 F (36.6 C) Temp Source: Oral Pulse Rate: 94 Resp: 18 BP: 134/73 mmHg Patient Position (if appropriate): Sitting Oxygen Therapy SpO2: 97 % O2 Device: Not Delivered Pain Pain Assessment Pain Score: 7  Pain Type: Acute pain Pain Location: Generalized Pain Descriptors / Indicators: Aching Pain Onset: On-going Pain Intervention(s): Ambulation/increased activity;Repositioned Cognition Overall Cognitive Status: Impaired/Different from baseline (Simultaneous filing. User may not have seen previous data.) Arousal/Alertness: Awake/alert (Simultaneous filing. User may not have seen previous data.) Orientation Level: Oriented X4 (Simultaneous filing. User may not have seen previous data.) Attention: Selective (Simultaneous filing. User may not have seen  previous data.) Sustained Attention: Appears intact (Simultaneous filing. User may not have seen previous data.) Selective Attention: Impaired (Simultaneous filing. User may not have seen previous data.) Selective Attention Impairment: Verbal complex (Simultaneous filing. User may not have seen previous data.) Memory: Impaired (Simultaneous filing. User may not have seen previous data.) Memory Impairment: Decreased recall of new information;Decreased short term memory (Simultaneous filing. User may not have seen previous data.) Decreased Short Term Memory: Verbal basic;Functional basic Awareness: Appears intact Problem Solving: Impaired Problem Solving Impairment: Functional complex Safety/Judgment: Appears intact (Simultaneous filing. User may not have seen previous data.) Sensation Sensation Light Touch: Appears Intact Hot/Cold: Appears Intact Proprioception: Appears Intact Coordination Gross Motor Movements are Fluid and Coordinated: Yes Fine Motor Movements are Fluid and Coordinated: No Motor  Motor Motor: Within Functional Limits;Primitive reflexes present Motor - Discharge Observations: demonstrates crouch posture due to imbalance  Mobility Bed Mobility Bed Mobility: Supine to Sit Supine to Sit: 5: Supervision Supine to Sit Details: Visual cues/gestures for precautions/safety;Tactile cues for initiation Transfers Transfers: Yes Sit to Stand: 5: Supervision Stand to Sit: 5: Supervision Stand to Sit Details (indicate cue type and reason): Verbal cues for sequencing Locomotion  Ambulation Ambulation: Yes Ambulation/Gait Assistance: 5: Supervision Ambulation Distance (Feet): 200 Feet Assistive device: Rolling walker Ambulation/Gait Assistance Details: Verbal cues for gait pattern Ambulation/Gait Assistance Details: due to crouch gait and decreased stride length Gait Gait Pattern: Step-through pattern;Trunk flexed;Decreased stride length;Decreased dorsiflexion -  right;Decreased dorsiflexion - left Gait velocity: 1.56 ft/sec Stairs / Additional Locomotion Stairs: Yes Stairs Assistance: 4: Min assist Stairs Assistance Details: Verbal cues for technique;Verbal cues for precautions/safety Stairs Assistance Details (indicate cue type and reason): cues for technique Stair Management Technique: One rail Right;Sideways;Step to pattern Number of Stairs: 16 Height of Stairs: 6 Ramp: Not tested (comment) Curb: 4: Min assist Wheelchair Mobility Wheelchair Mobility: No  Trunk/Postural Assessment  Cervical Assessment Cervical Assessment:  Exceptions to [facility] (forward head) Thoracic Assessment Thoracic Assessment:  (rounded shoulders) Lumbar Assessment Lumbar Assessment: Exceptions to [facility] (posterior pelvic tilt) Postural Control Postural Control:  (crouch posture )  Balance Balance Balance Assessed: Yes Static Sitting Balance Static Sitting - Balance Support: Feet supported Static Sitting - Level of Assistance: 5: Stand by assistance Dynamic Sitting Balance Dynamic Sitting - Balance Support: Feet supported Dynamic Sitting - Level of Assistance: 5: Stand by assistance Dynamic Standing Balance Dynamic Standing - Balance Support: During functional activity;Right upper extremity supported Dynamic Standing - Level of Assistance: 5: Stand by assistance Dynamic Standing - Balance Activities: Reaching for objects Extremity Assessment  RUE Assessment RUE Assessment: Within Functional Limits LUE Assessment LUE Assessment: Exceptions to [facility] (decreased grip strength and decreased flexion/extension of digits 4&5) RLE Assessment RLE Assessment: Within Functional Limits RLE Strength RLE Overall Strength: Within Functional Limits for tasks assessed LLE Assessment LLE Assessment: Within Functional Limits LLE Strength LLE Overall Strength: Within Functional Limits for tasks assessed  Function:  Toileting Toileting       Bed Mobility Roll left and right  activity   Assist level: Supervision or verbal cues  Sit to lying activity   Assist level: Supervision or verbal cues  Lying to sitting activity   Assist level: Supervision or verbal cues  Mobility details     Transfers Sit to stand transfer   Sit to stand assist level: Supervision or verbal cues Sit to stand assistive device: Walker  Chair/bed transfer   Chair/bed transfer method: Ambulatory Chair/bed transfer assist level: Supervision or verbal cues Chair/bed transfer assistive device: Walker   Chair/bed transfer details: Verbal cues for technique   Toilet transfer   Toilet transfer assistive device: Elevated toilet seat/BSC over toilet;Walker    Geneticist, molecular transfer assistive device: Publishing rights manager transfer assist level: Supervision or Lawyer device: Walker-rolling Max distance: 200 Assist level: Supervision or verbal cues  Walk 10 feet activity   Assist level: Supervision or verbal cues  Walk 50 feet with 2 turns activity   Assist level: Supervision or verbal cues  Walk 150 feet activity   Assist level: Supervision or verbal cues  Walk 10 feet on uneven surfaces activity Walk 10 feet on uneven surfaces activity did not occur: N/A    Stairs   Stairs assistive device: 1 hand rail Max number of stairs: 16 Stairs assist level: Supervision or verbal cues  Walk up/down 1 step activity     Walk up/down 1 step (curb) assist level: Supervision or verbal cues  Walk up/down 4 steps activity   Walk up/down 4 steps assist level: Supervision or verbal cues  Walk up/down 12 steps activity   Walk up/down 12 steps assist level: Supervision or verbal cues  Pick up small objects from floor   Assist level: Supervision or verbal cues  Wheelchair          Wheel 50 feet with 2 turns activity      Wheel 150 feet activity       Cognition Comprehension Comprehension assist level: Understands complex 90% of the time/cues 10% of the  time  Expression Expression assist level: Expresses complex ideas: With extra time/assistive device  Social Interaction Social Interaction assist level: Interacts appropriately with others with medication or extra time (anti-anxiety, antidepressant).  Problem Solving Problem solving assist level: Solves complex 90% of the time/cues < 10% of the time  Memory Memory assist level: Recognizes or recalls 90%  of the time/requires cueing < 10% of the time    [facility] 11/25/2014, 4:40 PM

## 2014-11-25 NOTE — Progress Notes (Signed)
[facility] PHYSICAL MEDICINE & REHABILITATION     PROGRESS NOTE    Subjective/Complaints: Back pain improved with oxycodone  ROS:  nausea, vomiting, abdominal pain, diarrhea, chest pain,  palpitations,  Continued urine retention   Objective: Vital Signs: Blood pressure 134/73, pulse 94, temperature 97.9 F (36.6 C), temperature source Oral, resp. rate 18, height 5\' 7"  (1.702 m), weight 61.3 kg (135 lb 2.3 oz), SpO2 97 %. No results found.  Recent Labs  11/25/14 0436  WBC 6.9  HGB 10.4*  HCT 32.7*  PLT 291    Recent Labs  11/25/14 0436  NA 132*  K 4.3  CL 99*  GLUCOSE 91  BUN 24*  CREATININE 1.31*  CALCIUM 8.7*   CBG (last 3)  No results for input(s): GLUCAP in the last 72 hours.  Wt Readings from Last 3 Encounters:  11/25/14 61.3 kg (135 lb 2.3 oz)  11/04/14 56.2 kg (123 lb 14.4 oz)  10/07/14 68.493 kg (151 lb)    Physical Exam:  Constitutional: He is oriented to person, place, and time. He appears well-developed and well-nourished.  Frail appearing male  HENT: oral mucosa a little dry. Voice stronger Head: Normocephalic and atraumatic.  Eyes: Conjunctivae are normal. Pupils are equal, round, and reactive to light.  Neck: Normal range of motion. Neck supple. Tenderness over upper traps Cardiovascular: Normal rate and regular rhythm. no murmurs, rubs, gallops Respiratory: Effort normal. Equal BS. He has no wheezes. No distress GI: Soft. Bowel sounds are normal. He exhibits no distension. There is no tenderness.  Musculoskeletal: He exhibits trace tibial edema.Marland Kitchen He exhibits no tenderness.  Neurological: He is alert and oriented to person, place, and time. Reasonable insight and awareness.  Dysphonic speech mild. Able to follow one and two step commands without difficulty.  UE: 4- deltoid, 4 bicep,right  tricep, wrist, hi LUE: tricep   4, wrist 4, HI 3, 4th/5th finger drop. LE: 3 hf, 3+ ke and 4 adf/apf  Skin: Skin is warm and dry.  Midline  sternal incision CDI, mild tenderness Schest wall incision well healed Psychiatric: Thought content generally normal. Pleasant mood.   Assessment/Plan: 1. Functional deficits secondary to debility after CABG and multiple medical  which require 3+ hours per day of interdisciplinary therapy in a comprehensive inpatient rehab setting. Physiatrist is providing close team supervision and 24 hour management of active medical problems listed below. Physiatrist and rehab team continue to assess barriers to discharge/monitor patient progress toward functional and medical goals. FIM: Function - Bathing Position: Wheelchair/chair at sink Body parts bathed by patient: Right arm, Left arm, Chest, Abdomen, Front perineal area, Buttocks, Right upper leg, Left upper leg, Right lower leg, Left lower leg Body parts bathed by helper: Back Assist Level: Supervision or verbal cues  Function- Upper Body Dressing/Undressing What is the patient wearing?: Button up shirt Button up shirt - Perfomed by patient: Thread/unthread right sleeve, Thread/unthread left sleeve, Pull shirt around back, Button/unbutton shirt Button up shirt - Perfomed by helper: Button/unbutton shirt Assist Level: Supervision or verbal cues Function - Lower Body Dressing/Undressing Lower body dressing/undressing activity did not occur: Refused What is the patient wearing?: Pants, Socks, Shoes, Ted Hose Position: Wheelchair/chair at [employer]- Performed by patient: Thread/unthread right pants leg, Thread/unthread left pants leg, Pull pants up/down Pants- Performed by helper: Fasten/unfasten pants Socks - Performed by patient: Don/doff right sock, Don/doff left sock Socks - Performed by helper: Don/doff right sock, Don/doff left sock Shoes - Performed by patient: Don/doff right shoe, Don/doff left  shoe, Fasten right, Fasten left TED Hose - Performed by helper: Don/doff right TED hose, Don/doff left TED hose Assist Level: Supervision or  verbal cues  Function - Toileting Toileting activity did not occur: No continent bowel/bladder event Toileting steps completed by patient: Adjust clothing prior to toileting, Performs perineal hygiene, Adjust clothing after toileting Toileting steps completed by helper: Adjust clothing prior to toileting, Adjust clothing after toileting Toileting Assistive Devices: Grab bar or rail Assist level: Supervision or verbal cues  Function - Air cabin crew transfer activity did not occur: Safety/medical concerns Toilet transfer assistive device: Elevated toilet seat/BSC over toilet, Walker Assist level to toilet: Supervision or verbal cues Assist level from toilet: Supervision or verbal cues  Function - Chair/bed transfer Chair/bed transfer method: Ambulatory Chair/bed transfer assist level: Supervision or verbal cues Chair/bed transfer assistive device: Walker Chair/bed transfer details: Verbal cues for technique  Function - Locomotion: Wheelchair Will patient use wheelchair at discharge?: No Function - Locomotion: Ambulation Assistive device: Walker-rolling Max distance: 200 Assist level: Supervision or verbal cues Assist level: Supervision or verbal cues Assist level: Supervision or verbal cues Assist level: Supervision or verbal cues Walk 10 feet on uneven surfaces activity did not occur: N/A  Function - Comprehension Comprehension: Auditory Comprehension assist level: Understands complex 90% of the time/cues 10% of the time  Function - Expression Expression: Verbal Expression assist level: Expresses complex ideas: With extra time/assistive device  Function - Social Interaction Social Interaction assist level: Interacts appropriately with others with medication or extra time (anti-anxiety, antidepressant).  Function - Problem Solving Problem solving assist level: Solves basic 75 - 89% of the time/requires cueing 10 - 24% of the time  Function - Memory Memory  assistive device: Memory book Memory assist level: Recognizes or recalls 50 - 74% of the time/requires cueing 25 - 49% of the time Patient normally able to recall (first 3 days only): Current season, Location of own room, Staff names and faces, That he or she is in a hospital  Medical Problem List and Plan: 1. Functional deficits secondary to debility after acute respiratory failure and CHF exacerbation s/p CABG.  -better exercise tolerance  -  memory issues improving  -extensive education was provided to patient/sister today regarding cv, neuro, uro issues    2. DVT Prophylaxis/Anticoagulation: Pharmaceutical: Lovenox due to VTE risk   3. Pain Management: oxycodone prn---observe activity tolerance with therapy- Back pain upper lumbar hx DDD,  -sports cream 4. Mood: Increase Remeron to home dose to help manage insomnia and mood. Team to provide ego support. LCSW to follow for evaluation and support.  5. Neuropsych: This patient is capable of making decisions on his own behalf. 6. Skin/Wound Care: Routine pressure relief measures. Maintain adequate nutritional intake. Offer supplements between meals.  7. Fluids/Electrolytes/Nutrition: Monitor [email or web address] , 100% meal intake. I personally reviewed the patient's labs today---all stable to improved 8. CAD s/p CABG: Continue sternal precautions. Continues to have incisional pain but no CP. On ASA, lipitor, coreg and Entresto.  9. Acute on systolic CHF: Monitor daily weights. Low salt diet.   aldactone, coreg, Entestor and digoxin.717ml fluid on 8/20  -lasix 40mg   - Cardiology following also.no recent med changes   -. Weight around near 61-62kg, has been stable recently 10 COPD: Avoiding Spiriva with BPH/voiding difficulties. Continue Brovan/pulmicort with prn Xopenex. To be discharged on symbicort 80/4.5 2 puffs bid + prn xopenex  -outpt follow up with pulmonary at dc  -lungs remain generally clear-     11. ABLA:  Slowly improving. hgb  10.4 today 12. VHQ:IONGEXBMW improved. 159/92 this am monitor for pattern 13. Urinary retention/BPH: continue Avodart and flomax--0.8mg  qpm  -beginning to empty a bit, still large pvr's, ICP 533ml this am  --ucx with 100k citrobacter fameri---sens to bactrim  -continue voiding trial while here. Will dc on foley and follow up with [facility] 14. Adjustment disorder: Mentation improving gradually.Continue Xanax for chronic anxiety/ adjustment reaction.  15. Reactive leucocytosis: Likely due to steroids/surgery. No signs of infection on exam. Continue to monitor 16. Stress induced hyperglycemia: Hgb A1C- 5.9. Improved    LOS (Days) 21 A FACE TO FACE EVALUATION WAS PERFORMED  SWARTZ,ZACHARY T 11/25/2014 3:50 PM

## 2014-11-25 NOTE — Discharge Instructions (Signed)
Inpatient Rehab Discharge Instructions  Castor Gittleman Discharge date and time:    Activities/Precautions/ Functional Status: Activity: no lifting, driving, or strenuous exercise  till cleared by MD. Continue sternal precautions.  Diet: cardiac diet Wound Care: keep wound clean and dry   Functional status:  ___ No restrictions     ___ Walk up steps independently _X_ 24/7 supervision/assistance   ___ Walk up steps with assistance ___ Intermittent supervision/assistance  ___ Bathe/dress independently _X__ Walk with walker    ___ Bathe/dress with assistance ___ Walk Independently    ___ Shower independently ___ Walk with assistance    ___ Shower with assistance ___ No alcohol     ___ Return to work/school ________     COMMUNITY REFERRALS UPON DISCHARGE:    Home Health:   PT     OT    ST    RN                        Agency:  [facility] Phone: [phone]   Medical Equipment/Items Ordered:  Rolling walker, tub bench                                                      Agency/Supplier:  [facility]     Special Instructions:    My questions have been answered and I understand these instructions. I will adhere to these goals and the provided educational materials after my discharge from the hospital.  Patient/Caregiver Signature _______________________________ Date __________  Clinician Signature _______________________________________ Date __________  Please bring this form and your medication list with you to all your follow-up doctor's appointments.

## 2014-11-25 NOTE — Progress Notes (Signed)
Occupational Therapy Session Note  Patient Details  Name: Travis Romero MRN: [id number] Date of Birth: 09-07-1950  Today's Date: 11/25/2014 OT Individual Time: 0700-0800 OT Individual Time Calculation (min): 60 min    Short Term Goals: Week 3:  OT Short Term Goal 1 (Week 3): STG=LTG due to ELOS  Skilled Therapeutic Interventions/Progress Updates:    Pt engaged in BADL retraining including bathing and dressing with sit<>stand from w/c at sink.  Pt amb with RW to bathroom for toileting tasks before bathing and dressing tasks.  Pt continues to decline shower, preferring to bathe at sink.  See Function below for assist levels.  Pt continues to require more than a reasonable amount of time to initiate and complete BADLs.  Pt requested that a tub transfer bench be ordered and delivered to room. Discussed importance of daily schedule after discharge, as well as importance of increased activity level at home.  Pt verbalized understanding.  Family was not present.  Focus on activity tolerance, sit<>stand, standing balance, functional amb with RW for home mgmt tasks, safety awareness, and discharge planning.  Therapy Documentation Precautions:  Precautions Precautions: Fall, Sternal Precaution Comments: watch HR  Restrictions Weight Bearing Restrictions: No Pain: Pain Assessment Pain Assessment: No/denies pain    Function:   Eating Eating   Eating Assist Level: More than reasonable amount of time           Grooming Oral Care,Brush Teeth, Clean Dentures Activity:      Assist Level: Supervision or verbal cues      Wash, Rinse, Dry Face Activity   Assist Level: Supervision or verbal cues      Wash, Rinse, Dry Hands Activity   Assist Level: Supervision or verbal cues      Brush, Comb Hair Activity   Assist Level: Supervision or verbal cues    Shave Activity   Assist Level: Supervision or verbal cues      Apply Makeup Activity Apply makeup activity did not occur: Patient  does not wear makeup                                                           Bathing Bathing position   Position: Wheelchair/chair at sink  Bathing parts Body parts bathed by patient: Right arm;Left arm;Chest;Abdomen;Front perineal area;Buttocks;Right upper leg;Left upper leg;Right lower leg;Left lower leg Body parts bathed by helper: Back  Bathing assist Assist Level: Supervision or verbal cues       Upper Body Dressing/Undressing Upper body dressing   What is the patient wearing?: Button up shirt         Button up shirt - Perfomed by patient: Thread/unthread right sleeve;Thread/unthread left sleeve;Pull shirt around back;Button/unbutton shirt      Upper body assist Assist Level: Supervision or verbal cues       Lower Body Dressing/Undressing Lower body dressing   What is the patient wearing?: Pants;Socks;Shoes;Ted Hose     Pants- Performed by patient: Thread/unthread right pants leg;Thread/unthread left pants leg;Pull pants up/down       Socks - Performed by patient: Don/doff right sock;Don/doff left sock   Shoes - Performed by patient: Don/doff right shoe;Don/doff left shoe;Fasten right;Fasten left         TED Hose - Performed by helper: Don/doff right TED hose;Don/doff left TED hose  Lower body  assist Assist Level: Supervision or verbal cues       Toileting Toileting   Toileting steps completed by patient: Adjust clothing prior to toileting;Performs perineal hygiene;Adjust clothing after toileting      Toileting assist Assist level: Supervision or verbal cues    Bed Mobility Roll left and right activity   Assist level: Supervision or verbal cues  Sit to lying activity   Assist level: Supervision or verbal cues  Lying to sitting activity      Mobility details     Transfers Sit to stand transfer   Sit to stand assist level: Supervision or verbal cues Sit to stand assistive device: Walker  Chair/bed transfer   Chair/bed transfer method:  Ambulatory Chair/bed transfer assist level: Supervision or verbal cues Chair/bed transfer assistive device: Public librarian transfer assistive device: Elevated toilet seat/BSC over toilet;Walker       Assist level to toilet: Supervision or verbal cues Assist level from toilet: Supervision or verbal cues      Cognition Comprehension Comprehension assist level: Understands complex 90% of the time/cues 10% of the time  Expression Expression assist level: Expresses complex ideas: With extra time/assistive device  Social Interaction Social Interaction assist level: Interacts appropriately with others with medication or extra time (anti-anxiety, antidepressant).  Problem Solving Problem solving assist level: Solves basic 75 - 89% of the time/requires cueing 10 - 24% of the time  Memory Memory assist level: Recognizes or recalls 50 - 74% of the time/requires cueing 25 - 49% of the time    Therapy/Group: Individual Therapy  [name] 11/25/2014, 8:02 AM

## 2014-11-25 NOTE — Progress Notes (Signed)
Occupational Therapy Discharge Summary  Patient Details  Name: Travis Romero MRN: [id number] Date of Birth: 08/09/1950   Patient has met 8 of 8 long term goals due to improved activity tolerance, improved balance, postural control and functional use of  LEFT upper extremity.  Pt made steady progress with BADLs during this admission.  Pt requires more than a reasonable amount of time to initiate and complete tasks.  Pt requires occasional verbal cues to initiate tasks and sequence BADLs.  Pt continues to exhibit decreased LUE strength and limited 4th and 5th finger flexion/extension.  Pt is able to fasten/unfasten buttons on shirt with additional time to complete task.  Patient to discharge at overall Supervision level.  Patient's care partner is independent to provide the necessary cognitive assistance at discharge.      Recommendation:  Patient will benefit from ongoing skilled OT services in home health setting to continue to advance functional skills in the area of BADL and Reduce care partner burden.  Equipment: tub transfer bench  Reasons for discharge: treatment goals met and discharge from hospital  Patient/family agrees with progress made and goals achieved: Yes  OT Discharge   Vision/Perception  Vision- History Baseline Vision/History: Wears glasses Wears Glasses: Reading only Patient Visual Report: No change from baseline Vision- Assessment Vision Assessment?: No apparent visual deficits  Cognition Overall Cognitive Status: Impaired/Different from baseline Arousal/Alertness: Awake/alert Orientation Level: Oriented X4 Attention: Selective Sustained Attention: Appears intact Selective Attention: Impaired Selective Attention Impairment: Verbal complex Memory Impairment: Decreased recall of new information;Decreased short term memory Decreased Short Term Memory: Verbal basic;Functional basic Safety/Judgment: Appears intact Sensation Sensation Light Touch: Appears  Intact Hot/Cold: Appears Intact Proprioception: Appears Intact Coordination Gross Motor Movements are Fluid and Coordinated: Yes Fine Motor Movements are Fluid and Coordinated: No Motor  Motor Motor: Within Functional Limits Trunk/Postural Assessment  Cervical Assessment Cervical Assessment: Exceptions to [facility] (forward head posture) Thoracic Assessment Thoracic Assessment: Exceptions to [facility] (forward shoulders and slight kyphosis) Lumbar Assessment Lumbar Assessment: Exceptions to [facility] (posterior pelvic tilt) Postural Control Postural Control:  (delayed righting reactions)  Balance Static Sitting Balance Static Sitting - Balance Support: Feet supported Static Sitting - Level of Assistance: 5: Stand by assistance Dynamic Sitting Balance Dynamic Sitting - Balance Support: Feet supported Dynamic Sitting - Level of Assistance: 5: Stand by assistance Extremity/Trunk Assessment RUE Assessment RUE Assessment: Within Functional Limits LUE Assessment LUE Assessment: Exceptions to [facility] (decreased grip strength and decreased flexion/extension of digits 4&5)  Function:   Eating Eating   Eating Assist Level: More than reasonable amount of time           Grooming Oral Care,Brush Teeth, Clean Dentures Activity:      Assist Level: Supervision or verbal cues      Wash, Rinse, Dry Face Activity   Assist Level: Supervision or verbal cues      Wash, Rinse, Dry Hands Activity   Assist Level: Supervision or verbal cues      Brush, Comb Hair Activity   Assist Level: Supervision or verbal cues    Shave Activity   Assist Level: Supervision or verbal cues      Apply Makeup Activity  Bathing Bathing position   Position: Wheelchair/chair at sink  Bathing parts Body parts bathed by patient: Right arm;Left arm;Chest;Abdomen;Front perineal area;Buttocks;Right upper leg;Left upper leg;Right lower leg;Left lower leg Body  parts bathed by helper: Back  Bathing assist Assist Level: Supervision or verbal cues       Upper Body Dressing/Undressing Upper body dressing   What is the patient wearing?: Button up shirt         Button up shirt - Perfomed by patient: Thread/unthread right sleeve;Thread/unthread left sleeve;Pull shirt around back;Button/unbutton shirt      Upper body assist Assist Level: Supervision or verbal cues       Lower Body Dressing/Undressing Lower body dressing         Pants- Performed by patient: Thread/unthread right pants leg;Thread/unthread left pants leg;Pull pants up/down       Socks - Performed by patient: Don/doff right sock;Don/doff left sock   Shoes - Performed by patient: Don/doff right shoe;Don/doff left shoe;Fasten right;Fasten left         TED Hose - Performed by helper: Don/doff right TED hose;Don/doff left TED hose  Lower body assist Assist Level: Supervision or verbal cues       Toileting Toileting   Toileting steps completed by patient: Adjust clothing prior to toileting;Performs perineal hygiene;Adjust clothing after toileting      Toileting assist Assist level: Supervision or verbal cues    Bed Mobility Roll left and right activity   Assist level: Supervision or verbal cues  Sit to lying activity   Assist level: Supervision or verbal cues  Lying to sitting activity   Assist level: Supervision or verbal cues  Mobility details     Transfers Sit to stand transfer   Sit to stand assist level: Supervision or verbal cues Sit to stand assistive device: Walker  Chair/bed transfer   Chair/bed transfer method: Ambulatory Chair/bed transfer assist level: Supervision or verbal cues Chair/bed transfer assistive device: Walker   Chair/bed transfer details: Verbal cues for technique  Toilet transfer   Toilet transfer assistive device: Elevated toilet seat/BSC over toilet;Walker       Assist level to toilet: Supervision or verbal cues Assist  level from toilet: Supervision or verbal cues  Tub/shower transfer             Cognition Comprehension Comprehension assist level: Understands complex 90% of the time/cues 10% of the time  Expression Expression assist level: Expresses complex ideas: With extra time/assistive device  Social Interaction Social Interaction assist level: Interacts appropriately with others with medication or extra time (anti-anxiety, antidepressant).  Problem Solving Problem solving assist level: Solves basic 75 - 89% of the time/requires cueing 10 - 24% of the time  Memory Memory assist level: Recognizes or recalls 50 - 74% of the time/requires cueing 25 - 49% of the time    [name] 11/25/2014, 1:58 PM

## 2014-11-25 NOTE — Progress Notes (Signed)
Occupational Therapy Session Note  Patient Details  Name: Travis Romero MRN: [id number] Date of Birth: 1951-01-09  Today's Date: 11/25/2014 OT Individual Time: 1300-1330 OT Individual Time Calculation (min): 30 min    Short Term Goals: Week 2:  OT Short Term Goal 1 (Week 2): Pt will complete LB dressing with mod A sit<>stand to increase functional independence.  OT Short Term Goal 2 (Week 2): Pt will complete UB dressing with supervision OT Short Term Goal 2 - Progress (Week 2): Progressing toward goal OT Short Term Goal 3 (Week 2): Pt will complete bathing tasks with min A OT Short Term Goal 3 - Progress (Week 2): Met OT Short Term Goal 4 (Week 2): Pt will perform toilet transfers with mod A OT Short Term Goal 4 - Progress (Week 2): Met  Skilled Therapeutic Interventions/Progress Updates:    Treatment session with focus on activity tolerance, functional mobility, and tub/shower transfers.  Pt finishing lunch upon arrival, discussed plan for session.  Pt's cousin, [name], arrived and therapist answered her questions in prep for d/c home tomorrow.  Pt completed tub/shower transfer with use of tub bench in ADL tubroom with supervision/setup.  Demonstrating recall of technique from previous sessions.  Pt ambulated > 200 feet back to room with RW without requiring rest break.  Pt and cousin report no further questions.    Therapy Documentation Precautions:  Precautions Precautions: Fall, Sternal Precaution Comments: watch HR  Restrictions Weight Bearing Restrictions: No General:   Vital Signs: Therapy Vitals Temp: 97.9 F (36.6 C) Temp Source: Oral Pulse Rate: 94 Resp: 18 BP: 134/73 mmHg Patient Position (if appropriate): Sitting Oxygen Therapy SpO2: 97 % O2 Device: Not Delivered Pain: Pain Assessment Pain Score: 4  Pain Type: Acute pain Pain Location: Generalized Pain Intervention(s): Medication (See eMAR)  Function:    Toileting Toileting   Toileting steps  completed by patient: Adjust clothing prior to toileting;Performs perineal hygiene;Adjust clothing after toileting      Toileting assist Assist level: Supervision or verbal cues    Bed Mobility Roll left and right activity   Assist level: Supervision or verbal cues  Sit to lying activity   Assist level: Supervision or verbal cues  Lying to sitting activity   Assist level: Supervision or verbal cues  Mobility details     Transfers Sit to stand transfer   Sit to stand assist level: Supervision or verbal cues Sit to stand assistive device: Marine scientist transfer activity did not occur: Simulated Tub/shower assistive device: Tub transfer bench;Grab bars;Walker   Assist level into tub: Supervision or verbal cues Assist level out of tub: Supervision or verbal cues   Cognition Comprehension Comprehension assist level: Understands complex 90% of the time/cues 10% of the time  Expression Expression assist level: Expresses complex ideas: With extra time/assistive device  Social Interaction Social Interaction assist level: Interacts appropriately with others with medication or extra time (anti-anxiety, antidepressant).  Problem Solving Problem solving assist level: Solves basic 75 - 89% of the time/requires cueing 10 - 24% of the time  Memory Memory assist level: Recognizes or recalls 50 - 74% of the time/requires cueing 25 - 49% of the time    Therapy/Group: Individual Therapy  Simonne Come 11/25/2014, 3:34 PM

## 2014-11-25 NOTE — Progress Notes (Signed)
Nutrition Follow-up  DOCUMENTATION CODES:   Severe malnutrition in context of chronic illness  INTERVENTION:   Continue 30 ml Prostat po BID, each dose provides 100 kcal and 15 grams of protein.   Provide nourishment snacks. (Ordered).  Encourage adequate PO intake.   NUTRITION DIAGNOSIS:   Malnutrition related to chronic illness as evidenced by severe depletion of body fat, severe depletion of muscle mass; ongoing  GOAL:   Patient will meet greater than or equal to 90% of their needs; met  MONITOR:   PO intake, Supplement acceptance, Weight trends, Labs, I & O's  REASON FOR ASSESSMENT:   Consult  (snacks)  ASSESSMENT:   64 y.o. male with history of HTN, COPD, tobacco abuse who was admitted on 10/11/14 with progressive SOB due to acute on chronic respiratory failure and was intubation at admission. He was found to have acute systolic CHF requiring aggressive diuresis and 2 D echo done revealing EF 20-25% with mild MR. 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 Intake has been adequate. Pt has been also consuming his Prostat. RD to continue with current orders.   Labs and medications reviewed.   Diet Order:  Diet Heart Room service appropriate?: Yes; Fluid consistency:: Thin  Skin:   (Incision on mid sternum and R thigh)  Last BM:  8/21  Height:   Ht Readings from Last 1 Encounters:  11/04/14 _0  (1.702 m)    Weight:   Wt Readings from Last 1 Encounters:  11/25/14 135 lb 2.3 oz (61.3 kg)    Ideal Body Weight:  67.3 kg  BMI:  Body mass index is 21.16 kg/(m^2).  Estimated Nutritional Needs:   Kcal:  1800-2000  Protein:  80-95 grams  Fluid:  1.8 - 1.9 L/day  EDUCATION NEEDS:   No education needs identified at this time  Corrin Parker, MS, RD, LDN Pager # [phone] After hours/ weekend pager # [phone]

## 2014-11-25 NOTE — Progress Notes (Signed)
Speech Language Pathology Session Note & Discharge Summary  Patient Details  Name: Travis Romero MRN: [id number] Date of Birth: [birth year]  Today's Date: 11/25/2014 SLP Individual Time: 0900-0955 SLP Individual Time Calculation (min): 55 min   Skilled Therapeutic Interventions:  Skilled treatment session focused on completion of patient and family education. SLP facilitated session by providing education to the patient, his sister and his cousin in regards to his current cognitive function and strategies to utilize at home to maximize recall, problem solving, organization, safety with medication administration and money management as well as the need for 24 hour supervision. Also educated the patient and his family on the importance of vocal hygiene to improve his vocal quality. All verbalized understanding of information, all questions were answered and handouts were also given to reinforce information. Patient will discharge home with 24 hour supervision from family and would benefit from f/u SLP services to maximize his overall cognitive function to maximize his functional independence and reduce caregiver burden.   Patient has met 5 of 5 long term goals.  Patient to discharge at overall Supervision level.   Reasons goals not met: N/A   Clinical Impression/Discharge Summary: Patient has made excellent gains and has met 5 of 5 LTG's this admission due to improved cognitive function and speech intelligibility. Currently, patient requires overall supervision cues for recall with use of external aids, functional problem solving and organization with mildly complex tasks, emergent awareness, selective attention and overall safety. Patient is also Mod I for use of an increased vocal intensity and is 100% intelligible at the sentence level.  Patient and family education is complete and patient will discharge home with 24 hour supervision. Patient would benefit from f/u skilled SLP intervention to  maximize cognitive function in order to maximize his overall functional independence and reduce caregiver burden.   Care Partner:  Caregiver Able to Provide Assistance: Yes  Type of Caregiver Assistance: Physical;Cognitive  Recommendation:  24 hour supervision/assistance;Home Health SLP  Rationale for SLP Follow Up: Maximize cognitive function and independence;Reduce caregiver burden   Equipment: N/A    Reasons for discharge: Treatment goals met;Discharged from hospital   Patient/Family Agrees with Progress Made and Goals Achieved: Yes   Function:  Eating Eating   Modified Consistency Diet: No Eating Assist Level: More than reasonable amount of time           Cognition Comprehension Comprehension assist level: Understands complex 90% of the time/cues 10% of the time  Expression   Expression assist level: Expresses complex ideas: With extra time/assistive device  Social Interaction Social Interaction assist level: Interacts appropriately with others with medication or extra time (anti-anxiety, antidepressant).  Problem Solving Problem solving assist level: Solves complex 90% of the time/cues < 10% of the time  Memory Memory assist level: Recognizes or recalls 90% of the time/requires cueing < 10% of the time   Mitsugi Schrader 11/25/2014, 4:36 PM

## 2014-11-25 NOTE — Progress Notes (Addendum)
Physical Therapy Session Note  Patient Details  Name: Travis Romero MRN: [id number] Date of Birth: [birth year]  Today's Date: 11/25/2014 PT Individual Time: 1000-1104     Short Term Goals: Week 3:  PT Short Term Goal 1 (Week 3): =LTG's due to ELOS  Skilled Therapeutic Interventions/Progress Updates:    Patient seen with sister and cousin present with focus of session on caregiver education.  Discussed sternal precautions and how this relates to function.  Educated on supervision level of assist for all mobility including stairs.  Demonstrated to sister transfers, ambulation, stairs and bed mobility with supervision. She return demonstrated on stairs, with ambulation with walker and transfers.  Discussed follow up therapies at home and need for shoes for safe mobility in the home as well as energy conservation techniques with planning out activities in advance making adjustments for extra time needed for all activities.  Patient and sister verbalized understanding of all.  Left in room with sister and cousin and nurse in room.  Therapy Documentation Precautions:  Precautions Precautions: Fall, Sternal Precaution Comments: watch HR  Restrictions Weight Bearing Restrictions: No  Pain: Pain Assessment Pain Score: 7  Pain Type: Acute pain Pain Location: Generalized Pain Descriptors / Indicators: Aching Pain Onset: On-going Pain Intervention(s): Ambulation/increased activity;Repositioned  Function:  Toileting Toileting   Toileting steps completed by patient: Adjust clothing prior to toileting;Performs perineal hygiene;Adjust clothing after toileting     Assist level: Supervision or verbal cues   Bed Mobility Roll left and right activity   Assist level: Supervision or verbal cues    Sit to lying activity   Assist level: Supervision or verbal cues    Lying to sitting activity   Assist level: Supervision or verbal cues    Mobility details     Transfers Sit to stand  transfer   Sit to stand assist level: Supervision or verbal cues Sit to stand assistive device: Walker  Chair/bed transfer   Chair/bed transfer method: Ambulatory Chair/bed transfer assist level: Supervision or verbal cues Chair/bed transfer assistive device: Walker   Chair/bed transfer details: Verbal cues for technique   Toilet transfer   Toilet transfer assistive device: Elevated toilet seat/BSC over toilet;Walker   Assist level to toilet: Supervision or verbal cues Assist level from toilet: Supervision or verbal cues      Geneticist, molecular transfer assistive device: Publishing rights manager transfer assist level: Supervision or Lawyer device: Walker-rolling Max distance: 200 Assist level: Supervision or verbal cues  Walk 10 feet activity   Assist level: Supervision or verbal cues  Walk 50 feet with 2 turns activity   Assist level: Supervision or verbal cues  Walk 150 feet activity   Assist level: Supervision or verbal cues  Walk 10 feet on uneven surfaces activity Walk 10 feet on uneven surfaces activity did not occur: N/A    Stairs   Stairs assistive device: 1 hand rail Max number of stairs: 16 Stairs assist level: Supervision or verbal cues  Walk up/down 1 step activity     Walk up/down 1 step (curb) assist level: Supervision or verbal cues  Walk up/down 4 steps activity   Walk up/down 4 steps assist level: Supervision or verbal cues  Walk up/down 12 steps activity   Walk up/down 12 steps assist level: Supervision or verbal cues  Pick up small objects from floor   Assist level: Supervision or verbal cues  Wheelchair  Wheel 50 feet with 2 turns activity      Wheel 150 feet activity       Cognition Comprehension Comprehension assist level: Understands complex 90% of the time/cues 10% of the time  Expression Expression assist level: Expresses complex ideas: With extra time/assistive device  Social Interaction Social  Interaction assist level: Interacts appropriately with others with medication or extra time (anti-anxiety, antidepressant).  Problem Solving Problem solving assist level: Solves complex 90% of the time/cues < 10% of the time  Memory Memory assist level: Recognizes or recalls 90% of the time/requires cueing < 10% of the time    Therapy/Group: Individual Therapy  Vander, [place] 11/25/2014  11/25/2014, 4:52 PM

## 2014-11-26 MED ORDER — METHOCARBAMOL 500 MG PO TABS: 500.0000 mg | ORAL_TABLET | Freq: Four times a day (QID) | ORAL | Status: AC | PRN

## 2014-11-26 MED ORDER — SENNOSIDES-DOCUSATE SODIUM 8.6-50 MG PO TABS: 2.0000 | ORAL_TABLET | Freq: Every day | ORAL | Status: AC

## 2014-11-26 MED ORDER — TRAMADOL HCL 50 MG PO TABS: 50.0000 mg | ORAL_TABLET | ORAL | Status: DC | PRN

## 2014-11-26 MED ORDER — FUROSEMIDE 40 MG PO TABS: 40.0000 mg | ORAL_TABLET | Freq: Every day | ORAL | Status: AC

## 2014-11-26 MED ORDER — FUROSEMIDE 40 MG PO TABS: 40.0000 mg | ORAL_TABLET | Freq: Two times a day (BID) | ORAL | Status: DC

## 2014-11-26 MED ORDER — OXYCODONE HCL 5 MG PO TABS: 5.0000 mg | ORAL_TABLET | Freq: Four times a day (QID) | ORAL | Status: DC | PRN

## 2014-11-26 MED ORDER — DIGOXIN 125 MCG PO TABS: 0.1250 mg | ORAL_TABLET | Freq: Every day | ORAL | Status: DC

## 2014-11-26 MED ORDER — SPIRONOLACTONE 25 MG PO TABS: 12.5000 mg | ORAL_TABLET | Freq: Every day | ORAL | Status: AC

## 2014-11-26 MED ORDER — CARVEDILOL 12.5 MG PO TABS: 12.5000 mg | ORAL_TABLET | Freq: Two times a day (BID) | ORAL | Status: DC

## 2014-11-26 MED ORDER — LEVALBUTEROL TARTRATE 45 MCG/ACT IN AERO: 1.0000 | INHALATION_SPRAY | Freq: Four times a day (QID) | RESPIRATORY_TRACT | Status: AC | PRN

## 2014-11-26 MED ORDER — PANTOPRAZOLE SODIUM 40 MG PO TBEC: 40.0000 mg | DELAYED_RELEASE_TABLET | Freq: Every day | ORAL | Status: AC

## 2014-11-26 MED ORDER — SULFAMETHOXAZOLE-TRIMETHOPRIM 400-80 MG PO TABS: 1.0000 | ORAL_TABLET | Freq: Two times a day (BID) | ORAL | Status: DC

## 2014-11-26 MED ORDER — DUTASTERIDE 0.5 MG PO CAPS: 0.5000 mg | ORAL_CAPSULE | Freq: Every day | ORAL | Status: DC

## 2014-11-26 MED ORDER — BUDESONIDE-FORMOTEROL FUMARATE 80-4.5 MCG/ACT IN AERO: 2.0000 | INHALATION_SPRAY | Freq: Two times a day (BID) | RESPIRATORY_TRACT | Status: AC

## 2014-11-26 MED ORDER — ALPRAZOLAM 0.5 MG PO TABS: 0.5000 mg | ORAL_TABLET | Freq: Four times a day (QID) | ORAL | Status: AC | PRN

## 2014-11-26 MED ORDER — SACUBITRIL-VALSARTAN 49-51 MG PO TABS: 1.0000 | ORAL_TABLET | Freq: Two times a day (BID) | ORAL | Status: DC

## 2014-11-26 MED ORDER — ATORVASTATIN CALCIUM 80 MG PO TABS: 80.0000 mg | ORAL_TABLET | Freq: Every day | ORAL | Status: AC

## 2014-11-26 MED ORDER — MIRTAZAPINE 45 MG PO TABS: 45.0000 mg | ORAL_TABLET | Freq: Every day | ORAL | Status: DC

## 2014-11-26 MED ORDER — TAMSULOSIN HCL 0.4 MG PO CAPS: 0.8000 mg | ORAL_CAPSULE | Freq: Every day | ORAL | Status: AC

## 2014-11-26 NOTE — Progress Notes (Signed)
Social Work  Discharge Note  The overall goal for the admission was met for:   Discharge location: Yes - returning to his apartment and sister, Chong Sicilian, to stay with him x 1 month  Length of Stay: Yes - 22 days  Discharge activity level: Yes - supervision/ min assist (cognition)  Home/community participation: Yes  Services provided included: MD, RD, PT, OT, SLP, RN, TR, Pharmacy, Neuropsych and SW  Financial Services: Private Insurance: [employer]  Follow-up services arranged: Home Health: RN, PT, OT, ST via [facility], DME: rolling walker, 3n1 commode via [facility], Other: assisted with start of SSD application and Patient/Family has no preference for HH/DME agencies  Comments (or additional information):  Patient/Family verbalized understanding of follow-up arrangements: Yes  Individual responsible for coordination of the follow-up plan: pt  Confirmed correct DME delivered: Daiden Coltrane 11/26/2014    Juliannah Ohmann

## 2014-11-26 NOTE — Discharge Summary (Addendum)
Physician Discharge Summary  Patient ID: Travis Romero MRN: [id number] DOB/AGE: [birth year] 64 y.o.  Admit date: 11/04/2014 Discharge date: 11/26/2014  Discharge Diagnoses:  Principal Problem:   Physical debility Active Problems:   Benign essential HTN   BPH (benign prostatic hyperplasia)   COPD (chronic obstructive pulmonary disease)   Acute combined systolic and diastolic CHF, NYHA class 4   S/P CABG x 3   Chronic systolic heart failure   Discharged Condition: stable    Labs:  Basic Metabolic Panel: BMP Latest Ref Rng 11/25/2014 11/18/2014 11/13/2014  Glucose 65 - 99 mg/dL 91 86 100(H)  BUN 6 - 20 mg/dL 24(H) 28(H) 28(H)  Creatinine 0.61 - 1.24 mg/dL 1.31(H) 1.21 1.16  Sodium 135 - 145 mmol/L 132(L) 134(L) 137  Potassium 3.5 - 5.1 mmol/L 4.3 4.4 4.4  Chloride 101 - 111 mmol/L 99(L) 97(L) 97(L)  CO2 22 - 32 mmol/L 27 28 31   Calcium 8.9 - 10.3 mg/dL 8.7(L) 9.0 9.3     CBC: CBC Latest Ref Rng 11/25/2014 11/06/2014 11/05/2014  WBC 4.0 - 10.5 K/uL 6.9 12.1(H) 11.5(H)  Hemoglobin 13.0 - 17.0 g/dL 10.4(L) 10.5(L) 10.7(L)  Hematocrit 39.0 - 52.0 % 32.7(L) 33.2(L) 33.6(L)  Platelets 150 - 400 K/uL 291 430(H) 451(H)     CBG:  Recent Labs Lab 11/19/14 1700 11/19/14 2105 11/20/14 0627 11/20/14 1117  GLUCAP 107* 109* 103* 143*    Brief HPI:   [name] is a 64 y.o. male with history of HTN, COPD, tobacco abuse who was admitted on 10/11/14 with progressive SOB due to acute on chronic respiratory failure and was intubation at admission. He was found to have acute systolic CHF requiring aggressive diuresis and 2 D echo done revealing EF 20-25% with mild MR. 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 Post op extubated without difficulty and volume excess treated with diuresis. Foley placed due to urinary retention and he was started on IV antibiotics due ongoing hypoxia and concerns  of COPD exacerbation/presumed PNA. He required bilateral thoracocentesis and Dr. Haroldine Laws was consulted for input on heart failure management and medications being adjusted with aggressive diuresis. Therapy ongoing and patient noted to have difficulty with processing and sequencing, poor safety awareness as well as worsening of left hand/wrist weakness. Patient was limited by sternal precautions, LUE weakness as well as deconditioning therefore CIR was recommended for follow up therapy.     Hospital Course: Travis Romero was admitted to rehab 11/04/2014 for inpatient therapies to consist of PT, ST and OT at least three hours five days a week. Past admission physiatrist, therapy team and rehab RN have worked together to provide customized collaborative inpatient rehab. Blood pressures have been stable and he was closely monitored for signs of overload. Dr. Haroldine Laws has followed patient for input and adjustment of cardiac medications. Coreg was titrated to 12.5 mg bid and aldactone was decreased to 12.5 mg daily. Serial labs have been monitored for stability and shows improvement in BUN to 24 with rise in creatinine to 1.31. He will need lytes rechecked on post hospital follow up.  He has been weaned off oxygen and weight at discharge is 63 kg.  ABLA is slowly resolving and leucocytosis has resolved.  Sternal incision has healed well without signs or symptoms of infection.   He has had high levels of anxiety requiring ego support. Xanax was increase to qid and Remeron was increased to home dose  of 45 mg to help with mood stabilization as well as insomnia. Dr. Pollard/psychologist was consulted for input and felt that patient did not report or display significant mood disorder. His mild cognitive disruption would likely improve over time. Po intake has improved with increase in activity tolerance. Foley was discontinued and voiding trial was initiated. He continued to have difficulty with voiding despite  addition of flomax and increase in mobility.  Repeat UCS 8/16 showed citrobacter farmeri UTI and septra was added for 7 day course antibiotic regimen. He has been able to void 100-150 cc but continues to have residuals of 650- 950 cc. Indwelling foley was placed at discharge due to patient's anxiety and fear about ability to perform self-caths. He has appointment to follow up with urology for voiding trial in a few weeks. He has made steady progress requires supervision with mobility and min assist with cognitive tasks. He will continue to receive follow up [employer], [place] , [place] and [place] [facility] [place] after discharge.    Rehab course: During patient's stay in rehab weekly team conferences were held to monitor patient's progress, set goals and discuss barriers to discharge. At admission, patient required mod to max assist with mobility and max assist with self care tasks. He was noted to have hoarse voice quality with moderate cognitive impairments affecting attention, recall, problems solving as well as safety. He has had improvement in activity tolerance, balance, postural control, as well as ability to compensate for deficits. He requires supervision for transfers and ambulation. He is able to ambulate 200' with RW. He is able to complete ADL tasks with supervision or verbal cues. Voice volume is improving and he is able to use strategies for vocal intensity and intelligibility independently. He requires supervisory cues for use of external aids for memory, problem solving and organization of mildly complex tasks. Family education was done with sister and cousin regarding sternal precautions, assistance with medication/money management as well as need for supervision.    Disposition: 01-Home or Self Care  Diet: Cardiac diet.   Special Instructions: 1. [facility] to provide HHPT,HHOT, [place] and HHST. 2. Continue sternal precautions till cleared by MD.   3.  Perform foley care twice daily.    4. May use up to 8 pills of ultram daily.     Medication List    STOP taking these medications        arformoterol 15 MCG/2ML Nebu  Commonly known as:  BROVANA     benzonatate 100 MG capsule  Commonly known as:  TESSALON     budesonide 0.5 MG/2ML nebulizer solution  Commonly known as:  PULMICORT     docusate sodium 100 MG capsule  Commonly known as:  COLACE     levalbuterol 0.63 MG/3ML nebulizer solution  Commonly known as:  XOPENEX      TAKE these medications        ALPRAZolam 0.5 MG tablet--RX # 120 pills   Commonly known as:  XANAX  Take 1 tablet (0.5 mg total) by mouth 4 (four) times daily as needed for anxiety.     aspirin 325 MG EC tablet  Take 1 tablet (325 mg total) by mouth daily.     atorvastatin 80 MG tablet  Commonly known as:  LIPITOR  Take 1 tablet (80 mg total) by mouth daily at 6 PM.     budesonide-formoterol 80-4.5 MCG/ACT inhaler  Commonly known as:  SYMBICORT  Inhale 2 puffs into the lungs 2 (two) times  daily.     carvedilol 12.5 MG tablet  Commonly known as:  COREG  Take 1 tablet (12.5 mg total) by mouth 2 (two) times daily with a meal.     digoxin 0.125 MG tablet  Commonly known as:  LANOXIN  Take 1 tablet (0.125 mg total) by mouth daily.     dutasteride 0.5 MG capsule  Commonly known as:  AVODART  Take 1 capsule (0.5 mg total) by mouth daily.     furosemide 40 MG tablet  Commonly known as:  LASIX  Take 1 tablet (40 mg total) by mouth daily.     levalbuterol 45 MCG/ACT inhaler  Commonly known as:  XOPENEX HFA  Inhale 1-2 puffs into the lungs every 6 (six) hours as needed for wheezing or shortness of breath.     methocarbamol 500 MG tablet  Commonly known as:  ROBAXIN  Take 1 tablet (500 mg total) by mouth every 6 (six) hours as needed for muscle spasms.     mirtazapine 45 MG tablet  Commonly known as:  REMERON  Take 1 tablet (45 mg total) by mouth at bedtime.     nicotine 21 mg/24hr patch  Commonly known as:  NICODERM CQ -  dosed in mg/24 hours  Place 1 patch (21 mg total) onto the skin daily.     oxyCODONE 5 MG immediate release tablet--Rx # 30 pills   Commonly known as:  Oxy IR/ROXICODONE  Take 1 tablet (5 mg total) by mouth every 6 (six) hours as needed for severe pain.     pantoprazole 40 MG tablet  Commonly known as:  PROTONIX  Take 1 tablet (40 mg total) by mouth daily.     sacubitril-valsartan 49-51 MG  Commonly known as:  ENTRESTO  Take 1 tablet by mouth 2 (two) times daily.     senna-docusate 8.6-50 MG per tablet  Commonly known as:  Senokot-S  Take 2 tablets by mouth at bedtime.     spironolactone 25 MG tablet  Commonly known as:  ALDACTONE  Take 0.5 tablets (12.5 mg total) by mouth daily.     sulfamethoxazole-trimethoprim 400-80 MG per tablet  Commonly known as:  BACTRIM,SEPTRA  Take 1 tablet by mouth every 12 (twelve) hours.     tamsulosin 0.4 MG Caps capsule  Commonly known as:  FLOMAX  Take 2 capsules (0.8 mg total) by mouth daily after supper.     traMADol 50 MG tablet--Rx # 175 pills   Commonly known as:  ULTRAM  Take 1-2 tablets (50-100 mg total) by mouth every 4 (four) hours as needed for moderate pain.       Follow-up Information    Follow up with Meredith Staggers, MD On 01/07/2015.   Specialty:  Physical Medicine and Rehabilitation   Why:  Be there at 10 am for 10:20 am   Contact information:   [address], [place] [address] [phone]       Follow up with Glori Bickers, MD. Call today.   Specialty:  Cardiology   Why:  for follow up appointment   Contact information:   [address] [place] [address] [phone]       Follow up with Melrose Nakayama, MD On 12/03/2014.   Specialty:  Cardiothoracic Surgery   Why:  Appointment at 9:45 am   Contact information:   [place] [address] [phone]       Follow up with Gravity,  Annie Main, MD On 12/13/2014.   Specialty:  Family Medicine    Why:  @ 11:00 am   Contact information:   [address] [place] [address] [phone]       Follow up with Christinia Gully, MD On 12/10/2014.   Specialty:  Pulmonary Disease   Why:  BE there at 10:00 am for evaluation of COPD   Contact information:   [address] [place] [address] [phone]       Follow up with Alexis Frock, MD On 12/20/2014.   Specialty:  Urology   Why:  BE there at 3 pm for 3:15 appointment   Contact information:   [place] [address] [phone]       Signed: Bary Leriche 11/26/2014, 3:04 PM

## 2014-11-26 NOTE — Progress Notes (Signed)
Coude foley catheter were inserted by RN.Pt. And family got educated about foley and how to prevent infections.Pt. And family got d/c instructions,prescriptions and follow up appointments.Pt. Ready to go home with family.

## 2014-11-26 NOTE — Progress Notes (Signed)
[place] [facility] PHYSICAL MEDICINE & REHABILITATION     PROGRESS NOTE    Subjective/Complaints: Up at sink. Excited but yet a little nervous to go home.   ROS:  nausea, vomiting, abdominal pain, diarrhea, chest pain,  palpitations,  Continued urine retention   Objective: Vital Signs: Blood pressure 159/84, pulse 94, temperature 98.5 F (36.9 C), temperature source Oral, resp. rate 18, height 5\' 7"  (1.702 m), weight 63 kg (138 lb 14.2 oz), SpO2 94 %. No results found.  Recent Labs  11/25/14 0436  WBC 6.9  HGB 10.4*  HCT 32.7*  PLT 291    Recent Labs  11/25/14 0436  NA 132*  K 4.3  CL 99*  GLUCOSE 91  BUN 24*  CREATININE 1.31*  CALCIUM 8.7*   CBG (last 3)  No results for input(s): GLUCAP in the last 72 hours.  Wt Readings from Last 3 Encounters:  11/26/14 63 kg (138 lb 14.2 oz)  11/04/14 56.2 kg (123 lb 14.4 oz)  10/07/14 68.493 kg (151 lb)    Physical Exam:  Constitutional: He is oriented to person, place, and time. He appears well-developed and well-nourished.  Frail appearing male  HENT: oral mucosa a little dry. Voice stronger Head: Normocephalic and atraumatic.  Eyes: Conjunctivae are normal. Pupils are equal, round, and reactive to light.  Neck: Normal range of motion. Neck supple. Tenderness over upper traps Cardiovascular: Normal rate and regular rhythm. no murmurs, rubs, gallops Respiratory: Effort normal. Equal BS. He has no wheezes. No distress GI: Soft. Bowel sounds are normal. He exhibits no distension. There is no tenderness.  Musculoskeletal: He exhibits trace tibial edema.Marland Kitchen He exhibits no tenderness.  Neurological: He is alert and oriented to person, place, and time. Reasonable insight and awareness.  Dysphonic speech mild. Able to follow one and two step commands without difficulty.  UE: 4- deltoid, 4 bicep,right  tricep, wrist, hi LUE: tricep   4, wrist 4, HI 3, 4th/5th finger drop. LE: 3 hf, 3+ ke and 4 adf/apf  Skin: Skin is warm and  dry.  Midline sternal incision CDI, mild tenderness Schest wall incision well healed Psychiatric: Thought content generally normal. Pleasant mood.   Assessment/Plan: 1. Functional deficits secondary to debility after CABG and multiple medical  which require 3+ hours per day of interdisciplinary therapy in a comprehensive inpatient rehab setting. Physiatrist is providing close team supervision and 24 hour management of active medical problems listed below. Physiatrist and rehab team continue to assess barriers to discharge/monitor patient progress toward functional and medical goals. FIM: Function - Bathing Position: Wheelchair/chair at sink Body parts bathed by patient: Right arm, Left arm, Chest, Abdomen, Front perineal area, Buttocks, Right upper leg, Left upper leg, Right lower leg, Left lower leg Body parts bathed by helper: Back Assist Level: Supervision or verbal cues  Function- Upper Body Dressing/Undressing What is the patient wearing?: Button up shirt Button up shirt - Perfomed by patient: Thread/unthread right sleeve, Thread/unthread left sleeve, Pull shirt around back, Button/unbutton shirt Button up shirt - Perfomed by helper: Button/unbutton shirt Assist Level: Supervision or verbal cues Function - Lower Body Dressing/Undressing Lower body dressing/undressing activity did not occur: Refused What is the patient wearing?: Pants, Socks, Shoes, Ted Hose Position: Wheelchair/chair at [employer]- Performed by patient: Thread/unthread right pants leg, Thread/unthread left pants leg, Pull pants up/down Pants- Performed by helper: Fasten/unfasten pants Socks - Performed by patient: Don/doff right sock, Don/doff left sock Socks - Performed by helper: Don/doff right sock, Don/doff left sock Shoes -  Performed by patient: Don/doff right shoe, Don/doff left shoe, Fasten right, Fasten left TED Hose - Performed by helper: Don/doff right TED hose, Don/doff left TED hose Assist Level:  Supervision or verbal cues  Function - Toileting Toileting activity did not occur: No continent bowel/bladder event Toileting steps completed by patient: Adjust clothing prior to toileting, Adjust clothing after toileting, Performs perineal hygiene Toileting steps completed by helper: Adjust clothing prior to toileting, Performs perineal hygiene, Adjust clothing after toileting Toileting Assistive Devices: Grab bar or rail Assist level: Touching or steadying assistance (Pt.75%)  Function - Air cabin crew transfer activity did not occur: Safety/medical concerns Toilet transfer assistive device: Walker Assist level to toilet: Touching or steadying assistance (Pt > 75%) Assist level from toilet: Supervision or verbal cues  Function - Chair/bed transfer Chair/bed transfer method: Stand pivot Chair/bed transfer assist level: Touching or steadying assistance (Pt > 75%) Chair/bed transfer assistive device: Walker Chair/bed transfer details: Verbal cues for sequencing, Verbal cues for technique  Function - Locomotion: Wheelchair Will patient use wheelchair at discharge?: No Function - Locomotion: Ambulation Assistive device: Walker-rolling Max distance: 200 Assist level: Supervision or verbal cues Assist level: Supervision or verbal cues Assist level: Supervision or verbal cues Assist level: Supervision or verbal cues Walk 10 feet on uneven surfaces activity did not occur: N/A  Function - Comprehension Comprehension: Auditory Comprehension assist level: Understands complex 90% of the time/cues 10% of the time  Function - Expression Expression: Verbal Expression assist level: Expresses complex ideas: With extra time/assistive device  Function - Social Interaction Social Interaction assist level: Interacts appropriately with others with medication or extra time (anti-anxiety, antidepressant).  Function - Problem Solving Problem solving assist level: Solves complex 90% of  the time/cues < 10% of the time  Function - Memory Memory assistive device: Memory book Memory assist level: Recognizes or recalls 90% of the time/requires cueing < 10% of the time Patient normally able to recall (first 3 days only): Current season, Location of own room, Staff names and faces, That he or she is in a hospital  Medical Problem List and Plan: 1. Functional deficits secondary to debility after acute respiratory failure and CHF exacerbation s/p CABG.  -better exercise tolerance  -  memory issues improving but still struggles with day to day info  -follow up with me in about a month. Needs cards/cvts follow up also  -?outpt EMG/NCS LUE  2. DVT Prophylaxis/Anticoagulation: Pharmaceutical: Lovenox due to VTE risk   3. Pain Management: oxycodone prn---observe activity tolerance with therapy- Back pain upper lumbar hx DDD,  -sports cream 4. Mood:  Remeron for insomnia and mood. Team to provide ego support. LCSW to follow for evaluation and support.  5. Neuropsych: This patient is capable of making decisions on his own behalf. 6. Skin/Wound Care: Routine pressure relief measures. Maintain adequate nutritional intake. Offer supplements between meals.  7. Fluids/Electrolytes/Nutrition: Monitor [email or web address] , 100% meal intake. I personally reviewed the patient's labs today---all stable to improved 8. CAD s/p CABG: Continue sternal precautions. Continues to have incisional pain but no CP. On ASA, lipitor, coreg and Entresto.  9. Acute on systolic CHF: Monitor daily weights. Low salt diet.   aldactone, coreg, Entestor and digoxin.744ml fluid on 8/20  -lasix 40mg   - Cardiology as outpt   -. Weight around near 61-63kg, has been stable recently 10 COPD: Avoiding Spiriva with BPH/voiding difficulties. Continue Brovan/pulmicort with prn Xopenex. To be discharged on symbicort 80/4.5 2 puffs bid + prn xopenex  -outpt follow up with  pulmonary at dc  -lungs remain generally clear-     11.  ABLA: Slowly improving. hgb 10.4 today 12. SNK:NLZJQBHAL improved. 159/92 this am monitor for pattern 13. Urinary retention/BPH: continue Avodart and flomax--0.8mg  qpm  -beginning to empty a bit, still large pvr's, ICP 580ml this am  --ucx with 100k citrobacter fameri---sens to bactrim  -continue voiding trial while here. Will dc on foley and follow up with [facility] 14. Adjustment disorder: Mentation improving gradually.Continue Xanax for chronic anxiety/ adjustment reaction.  15. Reactive leucocytosis: Likely due to steroids/surgery. No signs of infection on exam. Continue to monitor 16. Stress induced hyperglycemia: Hgb A1C- 5.9. Improved    LOS (Days) 22 A FACE TO FACE EVALUATION WAS PERFORMED  SWARTZ,ZACHARY T 11/26/2014 8:20 AM

## 2014-11-26 NOTE — Plan of Care (Signed)
Problem: RH BLADDER ELIMINATION Goal: RH STG MANAGE BLADDER WITH EQUIPMENT WITH ASSISTANCE STG Manage Bladder With Equipment With Assistance. Min A  RN inserted the coude foley catheter in,pt. Tolerated fine.Pt. And family were instructed about how to clean around the catheter to prevent infections and how to empty the leg bag.Pt. And family have the opportunity to ask questions that were answered by the RN.

## 2014-11-27 LAB — FUNGUS CULTURE W SMEAR: Fungal Smear: NONE SEEN

## 2014-12-02 ENCOUNTER — Other Ambulatory Visit: Payer: Self-pay | Admitting: Thoracic Surgery (Cardiothoracic Vascular Surgery)

## 2014-12-02 ENCOUNTER — Telehealth: Payer: Self-pay | Admitting: *Deleted

## 2014-12-02 DIAGNOSIS — Z951 Presence of aortocoronary bypass graft: Secondary | ICD-10-CM

## 2014-12-02 NOTE — Telephone Encounter (Signed)
Calling about his antibiotic he was supposed to be given at discharge from hospital and does not have enough. Do you know what this is about?

## 2014-12-02 NOTE — Telephone Encounter (Signed)
I spoke with his cousin (caller). This was handled by primary.

## 2014-12-02 NOTE — Telephone Encounter (Signed)
He was started on antibiotics on Friday before discharge and I gave him enough pills to finish his antibiotic course. He does not need any more.

## 2014-12-03 ENCOUNTER — Ambulatory Visit
Admission: RE | Admit: 2014-12-03 | Discharge: 2014-12-03 | Disposition: A | Discharge status: Home / Self Care | Payer: PRIVATE HEALTH INSURANCE | Source: Ambulatory Visit | Attending: Thoracic Surgery (Cardiothoracic Vascular Surgery) | Admitting: Thoracic Surgery (Cardiothoracic Vascular Surgery)

## 2014-12-03 ENCOUNTER — Ambulatory Visit (INDEPENDENT_AMBULATORY_CARE_PROVIDER_SITE_OTHER): Payer: Self-pay | Admitting: Thoracic Surgery (Cardiothoracic Vascular Surgery)

## 2014-12-03 ENCOUNTER — Encounter: Payer: Self-pay | Admitting: Thoracic Surgery (Cardiothoracic Vascular Surgery)

## 2014-12-03 VITALS — BP 110/59 | HR 85 | Resp 16 | Ht 67.0 in | Wt 138.0 lb

## 2014-12-03 DIAGNOSIS — Z951 Presence of aortocoronary bypass graft: Secondary | ICD-10-CM

## 2014-12-03 MED ORDER — PREDNISONE 10 MG (21) PO TBPK: ORAL_TABLET | ORAL | Status: DC

## 2014-12-03 MED ORDER — OXYCODONE HCL 5 MG PO TABS: 5.0000 mg | ORAL_TABLET | Freq: Four times a day (QID) | ORAL | Status: DC | PRN

## 2014-12-03 NOTE — Progress Notes (Signed)
[address]       [place] [address]             [phone]       HPI:  Travis Romero returns for a scheduled postoperative follow-up visit.  He is a 64 year old man who presented with acute respiratory failure due to congestive heart failure and a non-ST elevation MI. 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 He a coronary bypass grafting 3 on July 20. His postoperative course was slow. He had issues with severe deconditioning, pain control, urinary retention, and urinary tract infection. He was discharged to rehabilitation for deconditioning. He was discharged from rehabilitation a week ago.  He says that his progress continues to be slow. Is using a walker to ambulate. He is having a lot of back pain. He does still have some incisional pain, but the back pain is more troublesome component. He notices some mild swelling in his legs. He still has difficulty lying flat on his back. He has an indwelling Foley catheter. He is scheduled to see a urologist on September 16.  Past Medical History  Diagnosis Date  . Hypertension   . COPD (chronic obstructive pulmonary disease)   . BPH (benign prostatic hyperplasia)   . Back pain 2007    after fall down steps      Current Outpatient Prescriptions  Medication Sig Dispense Refill  . ALPRAZolam (XANAX) 0.5 MG tablet Take 1 tablet (0.5 mg total) by mouth 4 (four) times daily as needed for anxiety. 120 tablet 0  . aspirin EC 325 MG EC tablet Take 1 tablet (325 mg total) by mouth daily. 30 tablet 0  . atorvastatin (LIPITOR) 80 MG tablet Take 1 tablet (80 mg total) by mouth daily at 6 PM. 30 tablet 1  . budesonide-formoterol (SYMBICORT) 80-4.5 MCG/ACT inhaler Inhale 2 puffs into the lungs 2 (two) times daily. 1 Inhaler 1  . carvedilol (COREG) 12.5 MG tablet Take 1 tablet (12.5 mg total) by mouth 2 (two) times daily with a meal. 60 tablet 1  . digoxin (LANOXIN) 0.125 MG tablet  Take 1 tablet (0.125 mg total) by mouth daily. 30 tablet 1  . dutasteride (AVODART) 0.5 MG capsule Take 1 capsule (0.5 mg total) by mouth daily. 30 capsule 1  . furosemide (LASIX) 40 MG tablet Take 1 tablet (40 mg total) by mouth daily. 60 tablet 1  . levalbuterol (XOPENEX HFA) 45 MCG/ACT inhaler Inhale 1-2 puffs into the lungs every 6 (six) hours as needed for wheezing or shortness of breath. 1 Inhaler 12  . methocarbamol (ROBAXIN) 500 MG tablet Take 1 tablet (500 mg total) by mouth every 6 (six) hours as needed for muscle spasms. 90 tablet 1  . mirtazapine (REMERON) 45 MG tablet Take 1 tablet (45 mg total) by mouth at bedtime. 30 tablet 1  . nicotine (NICODERM CQ - DOSED IN MG/24 HOURS) 21 mg/24hr patch Place 1 patch (21 mg total) onto the skin daily. 28 patch 0  . oxyCODONE (OXY IR/ROXICODONE) 5 MG immediate release tablet Take 1-2 tablets (5-10 mg total) by mouth every 6 (six) hours as needed for severe pain. 50 tablet 0  . pantoprazole (PROTONIX) 40 MG tablet Take 1 tablet (40 mg total) by mouth daily. 30 tablet 1  . sacubitril-valsartan (ENTRESTO) 49-51 MG Take 1 tablet by mouth 2 (two) times daily. 60 tablet 1  . senna-docusate (SENOKOT-S) 8.6-50 MG per tablet Take 2 tablets by  mouth at bedtime. 60 tablet 1  . spironolactone (ALDACTONE) 25 MG tablet Take 0.5 tablets (12.5 mg total) by mouth daily. 30 tablet 1  . sulfamethoxazole-trimethoprim (BACTRIM,SEPTRA) 400-80 MG per tablet Take 1 tablet by mouth every 12 (twelve) hours. 6 tablet 0  . tamsulosin (FLOMAX) 0.4 MG CAPS capsule Take 2 capsules (0.8 mg total) by mouth daily after supper. 60 capsule 1  . traMADol (ULTRAM) 50 MG tablet Take 1-2 tablets (50-100 mg total) by mouth every 4 (four) hours as needed for moderate pain. 175 tablet 0   No current facility-administered medications for this visit.    Physical Exam BP 110/59 mmHg  Pulse 85  Resp 16  Ht 5\' 7"  (1.702 m)  Wt 138 lb (62.596 kg)  BMI 21.61 kg/m2  SpO110 85% 64 year old  man in no acute distress, appears chronically ill with muscle wasting Neuro alert and oriented 3, no focal deficit, generally weak Hoarse (improved from initial preop) Cardiac regular rate and rhythm normal S1 and S2 Lungs diminished breath sounds bilaterally Sternum stable, incision well-healed Leg incisions well healed, trace pitting edema bilaterally  Diagnostic Tests: Chest x-ray from today was reviewed. Shows a small left pleural effusion. The right pleural effusion has resolved.  Impression: Travis Romero is a 64 year old man with three-vessel disease and severe ischemic cardiomyopathy. He underwent coronary bypass grafting 3 on July 20. He had a very slow postoperative course, but was eventually discharged to rehabilitation. He now has been discharged home for the past week.  His chest x-ray shows a small left effusion, and he has trace edema bilaterally. He is on Aldactone and furosemide 40 mg daily. He was initially discharged on 40 mg twice daily, but that was changed. I advised him to weigh himself every day. If he gains more than 3 pounds in a day or more than 5 pounds in 3 days he should take an extra Lasix tablet in the evening.  I'm going to give him a prednisone taper to see if that will help with any inflammatory component to the left effusion. We'll see him back in a month and repeat his chest x-ray.  He has been taking oxycodone and Ultram simultaneously. I do not think that is a good plan. Change his oxycodone dose to 5-10 mg 4 times daily for pain. I gave him a prescription for 50 tablets, no refills. I advised him that he could take Ultram instead of the oxycodone at times when the pain wasn't as severe.  He was advised not to drive. I don't anticipate him being ready to return to work in the near future.  Plan: Prednisone taper  Return in one month with PA and lateral chest x-ray to follow-up left pleural effusion.  Melrose Nakayama, MD [facility] [phone]

## 2014-12-10 ENCOUNTER — Ambulatory Visit (INDEPENDENT_AMBULATORY_CARE_PROVIDER_SITE_OTHER): Payer: PRIVATE HEALTH INSURANCE | Admitting: Internal Medicine

## 2014-12-10 ENCOUNTER — Telehealth: Payer: Self-pay | Admitting: *Deleted

## 2014-12-10 ENCOUNTER — Encounter: Payer: Self-pay | Admitting: Internal Medicine

## 2014-12-10 VITALS — BP 146/80 | HR 100 | Ht 67.0 in | Wt 136.8 lb

## 2014-12-10 DIAGNOSIS — J449 Chronic obstructive pulmonary disease, unspecified: Secondary | ICD-10-CM | POA: Diagnosis not present

## 2014-12-10 DIAGNOSIS — I5021 Acute systolic (congestive) heart failure: Secondary | ICD-10-CM | POA: Diagnosis not present

## 2014-12-10 DIAGNOSIS — Z95818 Presence of other cardiac implants and grafts: Secondary | ICD-10-CM | POA: Diagnosis not present

## 2014-12-10 DIAGNOSIS — Z48812 Encounter for surgical aftercare following surgery on the circulatory system: Secondary | ICD-10-CM | POA: Diagnosis not present

## 2014-12-10 DIAGNOSIS — I2510 Atherosclerotic heart disease of native coronary artery without angina pectoris: Secondary | ICD-10-CM | POA: Diagnosis not present

## 2014-12-10 NOTE — Telephone Encounter (Signed)
Patty called back and I gave approval for MSW

## 2014-12-10 NOTE — Patient Instructions (Addendum)
Plan A = automatic = symbicort 80 Take 2 puffs first thing in am and then another 2 puffs about 12 hours later.   Plan B = Backup Only use your levoalbuterol (xopenex) as a rescue medication to be used if you can't catch your breath by resting or doing a relaxed purse lip breathing pattern.  - The less you use it, the better it will work when you need it. - Ok to use up to 2 puffs  every 4 hours if you must but call for immediate appointment if use goes up over your usual need - Don't leave home without it !!  (think of it like the spare tire for your car)   Ok to stop incentive spirometry (clear plastic inspirtory device)   Ok to continue the flutter valve (green blow in device) as needed for now   Please schedule a follow up office visit in 6 weeks, call sooner if needed with pfts on return

## 2014-12-10 NOTE — Progress Notes (Signed)
Subjective:    Patient ID: Travis Romero, male    DOB: 1950/06/03, 64 y.o.   MRN: [id number]  HPI  68 yowm with h/o limitations due to back not breathing quit smoking 10/2014 at admit Admit date: 10/11/2014 Discharge date: 11/04/2014   Discharge Diagnoses:  Acute on chronic systolic and diastolic CHF Patient Active Problem List   Diagnosis Date Noted  . 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 Urinary retention   . Pleural effusion   . BPH (benign prostatic hypertrophy) with urinary retention   . Coronary artery disease involving native coronary artery of native heart without angina pectoris   . SOB (shortness of breath)   . Tobacco abuse   . HLD (hyperlipidemia)   . Severe peripheral arterial disease 10/18/2014  . NSTEMI (non-ST elevated myocardial infarction)   . Acute on chronic combined systolic and diastolic CHF (congestive heart failure)   . Shortness of breath   . Adjustment disorder with anxious mood   . Acute respiratory failure with hypoxia 10/13/2014  . Acute combined systolic and diastolic CHF, NYHA class 4 10/13/2014  . Acute pulmonary edema   . Endotracheal tube present   . CHF exacerbation 10/12/2014  . Benign essential HTN 10/07/2014  . BPH (benign prostatic hyperplasia) 10/07/2014  . COPD (chronic obstructive pulmonary disease) 10/07/2014  . Smoker 10/07/2014   Discharged Condition: good  History of Present Illness:  Travis Romero is a 64 yo male with known history of COPD, PAD, BPH, HTN, Adjustment disorder, and tobacco abuse. The patient noticed he was becoming more short of breath over the past week. He states he was at work at which time a co-worker noticed he was having difficulty breathing. EMS was called and the patient was transported to the ED. Workup in the ED revealed progressive acute on chronic respiratory failure and required intubation. Further results revealed the patient to be in  acute systolic and diastolic CHF. He was admitted for further management.   Hospital Course:   The patient was weaned and extubated. He was agreesively diuresed. An Echocardiogram was obtained and showed a reduced EF of 20-25% with mild MR. He also underwent cardiac catheterization which revealed 3 vessel CAD. It was felt coronary artery bypass would be indicated and TCTS was consulted. He was evaluated by Dr. Roxan Hockey on 10/18/2014 at which time he was in agreement the patient would require bypass surgery. The risks and benefits of the procedure were explained to the patient and he was agreeable to proceed. The patient was further managed by Cardiology over the next several days.   He was felt medically stable for surgery and was taken to the operating room on 10/23/2014. He underwent CABG x 3 utilizing LIMA to LAD, SVG to OM2, and SVG to distal RCA. He also underwent endoscopic harvest of the greater saphenous vein from the right leg. He tolerated the procedure and was taken to the SICU in stable condition. He was extubated the evening of surgery. During his stay in the SICU, the patient was weaned off inotropes as tolerated. His chest tubes and arterial lines were removed without difficulty. He was tachycardic and hypertensive and his lopressor was changed to Coreg and he was restarted on Clonidine and amlodipine for hypertension. He has been followed by the Advanced Heart Failure Team and it was decided to stop the clonidine and amlodipine and start Entresto due to his low EF. He was agressively diuresed for CHF exacerbation and hypervolemia with good response to Lasix. Co-ox  on 8/1 was 63 with BNP of 699.  His pulmonary status has been an issue postop. The patient's chest x-ray showed bibasilar infiltrates and atelectasis. Due to his severe history of COPD, he was treated with aggressive pulmonary toilet measures and nebulizer treatments. He was also started on empiric antibiotics  for presumed pneumonia with leukocytosis. The patient's respiratory status continued to decline and chest x-ray showed worsening of infiltrates and atelectasis. Pulmonary consult was obtained for further treatment assistance. He underwent ultrasound-guided thoracentesis on 10/30/2014 which removed 1.5L of bloody fluid on the left. A bedside right thoracentesis on 10/31/2014 which removed 1200 ml of cloudy fluid. Cultures were obtained, which have been negative so far.   The patient developed severe constipation. He was treated with disimpaction and enema which was successful. He also had postop urinary retention requiring replacement of his Foley catheter on 7/26. We will plan to leave the Foley in place and plan a voiding trial next week per the rehab MD. Incisions are healing well. He remains on supplemental oxygen. He is tolerating a regular diet. He has completed a course of antibiotics and these have been discontinued.  The patient's main postoperative issue has been related to mobility. He is participating with PT and OT, but remains difficult to mobilize and get out of bed. Recommendation is for transfer to inpatient rehab for further therapies prior to discharge home. At the present time, he is medically stable for transfer once a bed is available.    Consults: pulmonary/intensive care and rehabilitation medicine  Significant Diagnostic Studies: angiography:    Prox RCA lesion, 100% stenosed.  Prox Cx lesion, 100% stenosed.  Dist LAD lesion, 100% stenosed.  2nd Diag lesion, 75% stenosed.  Prox LAD lesion, 60% stenosed.  There is severe left ventricular systolic dysfunction.  Treatments: procedures: thoracentesis- culture negative to date, cytology-no evidence of malignant cells  Surgery:  Median sternotomy, extracorporeal circulation, coronary artery bypass grafting x3 (left internal mammary artery to left anterior descending, saphenous vein graft to obtuse marginal 2,  saphenous vein graft to distal right coronary), endoscopic vein harvest, right leg.         12/10/2014 1st [facility] office visit/ Pennye Beeghly  On symb 160 / xopenex confused with maint vs prns Chief Complaint  Patient presents with  . Pulmonary Consult     Referred by Dr. Tawni Levy for eval of COPD. Pt states he was dxed with COPD in August 2016. He states that he never had a breathing problem before until recent hospital admission.  He states he gets SOB when he walks too fast.   walking 15-20 min threes times daily at moderate pace / no 02/ denies cough / am congestion which have resolved off cigs.  No obvious other patterns in day to day or daytime variabilty or assoc  cp or chest tightness, subjective wheeze overt sinus or hb symptoms. No unusual exp hx or h/o childhood pna/ asthma or knowledge of premature birth.  Sleeping ok without nocturnal  or early am exacerbation  of respiratory  c/o's or need for noct saba. Also denies any obvious fluctuation of symptoms with weather or environmental changes or other aggravating or alleviating factors except as outlined above   Current Medications, Allergies, Complete Past Medical History, Past Surgical History, Family History, and Social History were reviewed in [employer] record.            Review of Systems  Constitutional: Negative for fever, chills, activity change, appetite change and  unexpected weight change.  HENT: Negative for congestion, dental problem, postnasal drip, rhinorrhea, sneezing, sore throat, trouble swallowing and voice change.   Eyes: Negative for visual disturbance.  Respiratory: Positive for shortness of breath. Negative for cough and choking.   Cardiovascular: Negative for chest pain and leg swelling.  Gastrointestinal: Negative for nausea, vomiting and abdominal pain.  Genitourinary: Negative for difficulty urinating.  Musculoskeletal: Negative for arthralgias.  Skin: Negative for  rash.  Psychiatric/Behavioral: Negative for behavioral problems and confusion.       Objective:   Physical Exam  amb wm somber but nad/ 2 wheel walker/ foley in place    Wt Readings from Last 3 Encounters:  12/10/14 136 lb 12.8 oz (62.052 kg)  12/03/14 138 lb (62.596 kg)  11/26/14 138 lb 14.2 oz (63 kg)    Vital signs reviewed    HEENT: nl dentition, turbinates, and orophanx. Nl external ear canals without cough reflex   NECK :  without JVD/Nodes/TM/ nl carotid upstrokes bilaterally   LUNGS: no acc muscle use, clear to A and P bilaterally without cough on insp or exp maneuvers   CV:  RRR  no s3 or murmur or increase in P2, no edema   ABD:  soft and nontender with nl excursion in the supine position. No bruits or organomegaly, bowel sounds nl  MS:  warm without deformities, calf tenderness, cyanosis or clubbing  SKIN: warm and dry without lesions    NEURO:  alert, approp, no deficits   . I personally reviewed images and agree with radiology impression as follows:  CXR:  12/03/14  Improved appearance of the lung bases with resolution of the right pleural effusion and decreased left pleural effusion. There is no pulmonary edema. There are stable bullous changes in the right upper lobe.           Assessment & Plan:

## 2014-12-10 NOTE — Telephone Encounter (Signed)
Patty called to request an order for MSW visit to assist with community resources and transportation since the patients sister is no longer available for help.  I left a message on the voicemail of # left to please call office and ask for  Sybil since I can not leave information on voicemail with no identification.

## 2014-12-11 ENCOUNTER — Encounter: Payer: Self-pay | Admitting: Internal Medicine

## 2014-12-11 NOTE — Assessment & Plan Note (Addendum)
12/10/2014  Walked RA x 3 laps @ 185 ft each stopped due to  End of study, nl pace for a  rolling walker, no desats or sob  The proper method of use, as well as anticipated side effects, of a metered-dose inhaler are discussed and demonstrated to the patient. Improved effectiveness after extensive coaching during this visit to a level of approximately  75%   As I explained to this patient in detail:  although there may be copd present, it may not be clinically relevant:   it does not appear to be limiting activity tolerance any more than a set of worn tires limits someone from driving a car  around a parking lot.  A new set of Michelins might look good but would have no perceived impact on the performance of the car and would not be worth the cost.  That is to say:   this pt is so sedentary I don't recommend any additional medication and in fact when he returns for PFTs we may find it reasonable to reduce his medications. For now though he needs to understand the difference between a maintenance and a prn.  I had an extended discussion with the patient reviewing all relevant studies completed to date and  Lasting 72 m    I reviewed the Fletcher curve with the patient that basically indicates  if you quit smoking when your best day FEV1 is still well preserved (as is clearly  the case here)  it is highly unlikely you will progress to severe disease and informed the patient there was no medication on the market that has proven to alter the curve/ its downward trajectory  or the likelihood of progression of their disease.  Therefore  At this point maintaining abstinence is the most important aspect of care, not choice of inhalers or for that matter, doctors.    Each maintenance medication was reviewed in detail including most importantly the difference between maintenance and prns and under what circumstances the prns are to be triggered using an action plan format that is not reflected in the computer  generated alphabetically organized AVS.    Please see instructions for details which were reviewed in writing and the patient given a copy highlighting the part that I personally wrote and discussed at today's ov.

## 2014-12-12 ENCOUNTER — Ambulatory Visit (HOSPITAL_COMMUNITY)
Admission: RE | Admit: 2014-12-12 | Discharge: 2014-12-12 | Disposition: A | Discharge status: Home / Self Care | Payer: PRIVATE HEALTH INSURANCE | Source: Ambulatory Visit | Attending: Internal Medicine | Admitting: Internal Medicine

## 2014-12-12 DIAGNOSIS — J449 Chronic obstructive pulmonary disease, unspecified: Secondary | ICD-10-CM | POA: Insufficient documentation

## 2014-12-12 LAB — PULMONARY FUNCTION TEST
DL/VA % pred: 39 %
DL/VA: 1.73 ml/min/mmHg/L
DLCO COR % PRED: 30 %
DLCO UNC % PRED: 26 %
DLCO UNC: 7.4 ml/min/mmHg
DLCO cor: 8.63 ml/min/mmHg
FEF 25-75 POST: 0.32 L/s
FEF 25-75 PRE: 0.41 L/s
FEF2575-%Change-Post: -23 %
FEF2575-%PRED-PRE: 16 %
FEF2575-%Pred-Post: 12 %
FEV1-%Change-Post: -8 %
FEV1-%PRED-POST: 44 %
FEV1-%Pred-Pre: 47 %
FEV1-POST: 1.36 L
FEV1-Pre: 1.49 L
FEV1FVC-%Change-Post: 2 %
FEV1FVC-%PRED-PRE: 57 %
FEV6-%CHANGE-POST: -7 %
FEV6-%PRED-POST: 65 %
FEV6-%Pred-Pre: 71 %
FEV6-POST: 2.59 L
FEV6-Pre: 2.8 L
FEV6FVC-%CHANGE-POST: 3 %
FEV6FVC-%PRED-POST: 89 %
FEV6FVC-%Pred-Pre: 86 %
FVC-%CHANGE-POST: -10 %
FVC-%Pred-Post: 73 %
FVC-%Pred-Pre: 82 %
FVC-Post: 3.07 L
FVC-Pre: 3.43 L
PRE FEV1/FVC RATIO: 43 %
PRE FEV6/FVC RATIO: 82 %
Post FEV1/FVC ratio: 45 %
Post FEV6/FVC ratio: 85 %
RV % pred: 191 %
RV: 4.14 L
TLC % PRED: 115 %
TLC: 7.42 L

## 2014-12-12 MED ORDER — ALBUTEROL SULFATE (2.5 MG/3ML) 0.083% IN NEBU
2.5000 mg | INHALATION_SOLUTION | Freq: Once | RESPIRATORY_TRACT | Status: AC
  Administered 2014-12-12: 2.5 mg via RESPIRATORY_TRACT

## 2014-12-13 ENCOUNTER — Other Ambulatory Visit: Payer: Self-pay | Admitting: *Deleted

## 2014-12-13 DIAGNOSIS — G8918 Other acute postprocedural pain: Secondary | ICD-10-CM

## 2014-12-13 MED ORDER — OXYCODONE HCL 5 MG PO TABS: 5.0000 mg | ORAL_TABLET | Freq: Four times a day (QID) | ORAL | Status: DC | PRN

## 2014-12-13 NOTE — Telephone Encounter (Signed)
Travis Romero has called for a refill for his Oxycodone, last filled 8/3/0/16, s/p CABG 10/23/14. Dr. Phillips Climes had discussed his pain management with him at his last visit, using Tramadol for the less severe pain.  I informed him that a new signed script would be available this afternoon at our office and he agreed.

## 2014-12-18 ENCOUNTER — Telehealth: Payer: Self-pay | Admitting: Internal Medicine

## 2014-12-18 NOTE — Telephone Encounter (Signed)
Spoke with pt and informed of PFT results per Dr Melvyn Novas.

## 2014-12-19 ENCOUNTER — Ambulatory Visit (HOSPITAL_COMMUNITY)
Admission: RE | Admit: 2014-12-19 | Discharge: 2014-12-19 | Disposition: A | Discharge status: Home / Self Care | Payer: PRIVATE HEALTH INSURANCE | Source: Ambulatory Visit | Attending: Internal Medicine | Admitting: Internal Medicine

## 2014-12-19 ENCOUNTER — Telehealth: Payer: Self-pay | Admitting: *Deleted

## 2014-12-19 ENCOUNTER — Telehealth (HOSPITAL_COMMUNITY): Payer: Self-pay | Admitting: *Deleted

## 2014-12-19 ENCOUNTER — Encounter (HOSPITAL_COMMUNITY): Payer: Self-pay | Admitting: Internal Medicine

## 2014-12-19 VITALS — BP 134/76 | HR 93 | Wt 139.0 lb

## 2014-12-19 DIAGNOSIS — Z951 Presence of aortocoronary bypass graft: Secondary | ICD-10-CM | POA: Diagnosis not present

## 2014-12-19 DIAGNOSIS — Z8249 Family history of ischemic heart disease and other diseases of the circulatory system: Secondary | ICD-10-CM | POA: Insufficient documentation

## 2014-12-19 DIAGNOSIS — Z7982 Long term (current) use of aspirin: Secondary | ICD-10-CM | POA: Insufficient documentation

## 2014-12-19 DIAGNOSIS — I5022 Chronic systolic (congestive) heart failure: Secondary | ICD-10-CM

## 2014-12-19 DIAGNOSIS — I2510 Atherosclerotic heart disease of native coronary artery without angina pectoris: Secondary | ICD-10-CM

## 2014-12-19 DIAGNOSIS — I10 Essential (primary) hypertension: Secondary | ICD-10-CM | POA: Insufficient documentation

## 2014-12-19 DIAGNOSIS — J449 Chronic obstructive pulmonary disease, unspecified: Secondary | ICD-10-CM | POA: Insufficient documentation

## 2014-12-19 DIAGNOSIS — R339 Retention of urine, unspecified: Secondary | ICD-10-CM | POA: Insufficient documentation

## 2014-12-19 DIAGNOSIS — Z79899 Other long term (current) drug therapy: Secondary | ICD-10-CM | POA: Insufficient documentation

## 2014-12-19 DIAGNOSIS — Z87891 Personal history of nicotine dependence: Secondary | ICD-10-CM | POA: Diagnosis not present

## 2014-12-19 LAB — DIGOXIN LEVEL: Digoxin Level: 0.5 ng/mL — ABNORMAL LOW (ref 0.8–2.0)

## 2014-12-19 LAB — BASIC METABOLIC PANEL
ANION GAP: 8 (ref 5–15)
BUN: 18 mg/dL (ref 6–20)
CO2: 26 mmol/L (ref 22–32)
Calcium: 9.4 mg/dL (ref 8.9–10.3)
Chloride: 101 mmol/L (ref 101–111)
Creatinine, Ser: 1.28 mg/dL — ABNORMAL HIGH (ref 0.61–1.24)
GFR calc Af Amer: >60 mL/min (ref >60)
GFR, EST NON AFRICAN AMERICAN: 58 mL/min — AB (ref >60)
GLUCOSE: 109 mg/dL — AB (ref 65–99)
POTASSIUM: 4.4 mmol/L (ref 3.5–5.1)
Sodium: 135 mmol/L (ref 135–145)

## 2014-12-19 MED ORDER — SACUBITRIL-VALSARTAN 97-103 MG PO TABS: 1.0000 | ORAL_TABLET | Freq: Two times a day (BID) | ORAL | Status: DC

## 2014-12-19 NOTE — Progress Notes (Signed)
Patient ID: Travis Romero, male   DOB: 20-Apr-1950, 64 y.o.   MRN: [id number]  .  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   Admitted to [facility] in July with dyspnea. Had [place] that showed 3 vessel disease with subsequent CABG x3. Post operative course was complicated by HTN, respiratory distress, pleural effusion, and deconditioning. Once stable he was admitted to inpatient rehab August 1st through August 23. Discharge weight was 138 pounds.   He returns for HF follow up. Overall feeling much better. Denies SOB/PND/Orthopnea/CP. Increased leg edema. Taking all medications. Not smoking. [facility] following. Living alone.    Studies ECHO 12/2014 -EF 20-25%. Grade IDD  [facility] 10/17/2014   Prox RCA lesion, 100% stenosed.  Prox Cx lesion, 100% stenosed.  Dist LAD lesion, 100% stenosed.  2nd Diag lesion, 75% stenosed.  Prox LAD lesion, 60% stenosed.  There is severe left ventricular systolic dysfunction.  ROS: All systems negative except as listed in HPI, PMH and Problem List.  SH:  Social History   Social History  . Marital Status: Divorced    Spouse Name: n/a  . Number of Children: 0  . Years of Education: master's   Occupational History  . licensed clinical social worker     Warden/ranger   Social History Main Topics  . Smoking status: Former Smoker -- 1.50 packs/day for 20 years    Types: Cigarettes    Quit date: 10/11/2014  . Smokeless tobacco: Never Used  . Alcohol Use: 1.2 - 1.8 oz/week    2-3 Standard drinks or equivalent per week     Comment: 10/2014 " I drink everyday after supper " 2 glasses of wine   . Drug Use: No  . Sexual Activity: Not on file   Other Topics Concern  . Not on file   Social History Narrative   Lives alone.   A  cousin, Travis Romero, lives nearby.    FH:  Family History  Problem Relation Age of Onset  . Hypertension Mother   . Heart disease Father     Past Medical History  Diagnosis Date  . Hypertension   . COPD (chronic obstructive pulmonary disease)   . BPH (benign prostatic hyperplasia)   . Back pain 2007    after fall down steps    Current Outpatient Prescriptions  Medication Sig Dispense Refill  . ALPRAZolam (XANAX) 0.5 MG tablet Take 1 tablet (0.5 mg total) by mouth 4 (four) times daily as needed for anxiety. 120 tablet 0  . aspirin EC 325 MG EC tablet Take 1 tablet (325 mg total) by mouth daily. 30 tablet 0  . atorvastatin (LIPITOR) 80 MG tablet Take 1 tablet (80 mg total) by mouth daily at 6 PM. 30 tablet 1  . budesonide-formoterol (SYMBICORT) 80-4.5 MCG/ACT inhaler Inhale 2 puffs into the lungs 2 (two) times daily. 1 Inhaler 1  . caffeine 200 MG TABS tablet Take 200 mg by mouth daily.    . carvedilol (COREG) 12.5 MG tablet Take 1 tablet (12.5 mg total) by mouth 2 (two) times daily with a meal. 60 tablet 1  . digoxin (LANOXIN) 0.125 MG tablet Take 1 tablet (0.125 mg total) by mouth daily. 30 tablet 1  . dutasteride (AVODART) 0.5 MG capsule Take 1 capsule (0.5 mg total) by  mouth daily. 30 capsule 1  . FIBER, CORN DEXTRIN, PO Take 1 tablet by mouth daily.    . furosemide (LASIX) 40 MG tablet Take 1 tablet (40 mg total) by mouth daily. 60 tablet 1  . levalbuterol (XOPENEX HFA) 45 MCG/ACT inhaler Inhale 1-2 puffs into the lungs every 6 (six) hours as needed for wheezing or shortness of breath. 1 Inhaler 12  . methocarbamol (ROBAXIN) 500 MG tablet Take 1 tablet (500 mg total) by mouth every 6 (six) hours as needed for muscle spasms. 90 tablet 1  . mirtazapine (REMERON) 30 MG tablet Take 30 mg by mouth at bedtime.    . nicotine (NICODERM CQ - DOSED IN MG/24 HOURS) 21 mg/24hr patch Place 1 patch (21 mg total) onto the skin daily. 28 patch 0  . oxyCODONE (OXY IR/ROXICODONE) 5 MG immediate release  tablet Take 1-2 tablets (5-10 mg total) by mouth every 6 (six) hours as needed for severe pain. 50 tablet 0  . pantoprazole (PROTONIX) 40 MG tablet Take 1 tablet (40 mg total) by mouth daily. 30 tablet 1  . sacubitril-valsartan (ENTRESTO) 49-51 MG Take 1 tablet by mouth 2 (two) times daily. 60 tablet 1  . senna-docusate (SENOKOT-S) 8.6-50 MG per tablet Take 2 tablets by mouth at bedtime. 60 tablet 1  . spironolactone (ALDACTONE) 25 MG tablet Take 0.5 tablets (12.5 mg total) by mouth daily. 30 tablet 1  . tamsulosin (FLOMAX) 0.4 MG CAPS capsule Take 2 capsules (0.8 mg total) by mouth daily after supper. 60 capsule 1  . traZODone (DESYREL) 50 MG tablet Take 50 mg by mouth at bedtime.     No current facility-administered medications for this encounter.    Filed Vitals:   12/19/14 1233  BP: 134/76  Pulse: 93  Weight: 139 lb (63.05 kg)  SpO2: 97%    PHYSICAL EXAM:  General:  Well appearing. No resp difficulty HEENT: normal Neck: supple. JVP 8-9. Carotids 2+ bilaterally; no bruits. No lymphadenopathy or thryomegaly appreciated. Cor: PMI normal. Regular rate & rhythm. No rubs, gallops or murmurs. Lungs: Decreased LLL  Abdomen: soft, nontender, nondistended. No hepatosplenomegaly. No bruits or masses. Good bowel sounds. Extremities: no cyanosis, clubbing, rash, R and LLE 2+ edema Neuro: alert & orientedx3, cranial nerves grossly intact. Moves all 4 extremities w/o difficulty. Affect pleasant.      ASSESSMENT & PLAN:  1. Chronic Systolic HF- ICM per [facility] ECHO 10/12/14 EF 20%  NYHA II-III. Volume status mildly elevated today. Increase lasix 40 mg twice a day for 3 days then resume lasix 40 mg daily. - Continue 12.5 mg spiro daily  - Continue carvedilol 12.5 mg twice a day. Will not uptitrate due volume overload.  -Increase Entresto to 97-103 twice a day . Check BMET next week.  -Plan to repeat ECHO in 3-4 months after HF med optimized.  Check BMET  2. CABG x3 10/2014-  IMA-LAD;SVG to  OM,SVG to Distal RCA (N/A). On statin, bb, aspirin  3. HTN- Better controlled. As above increase Entresto.  4.Urinary Retention- Followed by Urology. On flomax.  5. H/O L pleural effusion   BMET and dig level  today --K 4.4 Creatinine 1.28 Dig level 0.5  Follow up in 2-3 weeks.     CLEGG,AMY NP-C  3:00 PM  Patient seen and examined with Darrick Grinder, NP. We discussed all aspects of the encounter. I agree with the assessment and plan as stated above.   Overall improving steadily but does have mild volume overload. Agree with med  titration as above. Labs ok. Getting near the point where he can go to CR.   Jedadiah Abdallah,MD 6:42 PM

## 2014-12-19 NOTE — Telephone Encounter (Signed)
Alonna Minium, speech therapist, [facility] called asking for verbal orders for one more visit to address cognitive deficits.  I called and gave the verbal orders per office protocol.

## 2014-12-19 NOTE — Patient Instructions (Signed)
Increase Furosemide (Lasix) to 40 mg twice daily FOR 3 DAYS ONLY, THEN RESUME 40 MG DAILY  Increase Entresto to 97/103 mg twice daily  Labs today  Your physician recommends that you schedule a follow-up appointment in: 2-3 weeks

## 2014-12-19 NOTE — Progress Notes (Signed)
Advanced Heart Failure Medication Review by a Pharmacist  Does the patient  feel that his/her medications are working for him/her?  yes  Has the patient been experiencing any side effects to the medications prescribed?  no  Does the patient measure his/her own blood pressure or blood glucose at home?  yes   Does the patient have any problems obtaining medications due to transportation or finances?   no  Understanding of regimen: good Understanding of indications: good Potential of compliance: good    Pharmacist comments:  Travis Romero is a pleasant 64 yo M presenting with his wife and an up-to-date medication. He has a good understanding of his regimen and the importance of consistent use but we did review the indications and mechanisms for each of his HF medications. He did not have any other medication-related questions or concerns for me at this time.   [name], PharmD, BCPS, CPP Clinical Pharmacist Pager: [phone] Phone: [phone] 12/19/2014 12:38 PM

## 2014-12-19 NOTE — Telephone Encounter (Signed)
Faxed order for [facility] to draw bemt next week per Amy. Also sent [facility] a staff msg.

## 2014-12-23 ENCOUNTER — Encounter: Payer: Self-pay | Admitting: Internal Medicine

## 2014-12-23 ENCOUNTER — Ambulatory Visit (INDEPENDENT_AMBULATORY_CARE_PROVIDER_SITE_OTHER): Payer: PRIVATE HEALTH INSURANCE | Admitting: Internal Medicine

## 2014-12-23 VITALS — BP 120/70 | HR 97 | Ht 67.0 in | Wt 135.4 lb

## 2014-12-23 DIAGNOSIS — J449 Chronic obstructive pulmonary disease, unspecified: Secondary | ICD-10-CM

## 2014-12-23 DIAGNOSIS — Z23 Encounter for immunization: Secondary | ICD-10-CM | POA: Diagnosis not present

## 2014-12-23 MED ORDER — BUDESONIDE-FORMOTEROL FUMARATE 80-4.5 MCG/ACT IN AERO: 2.0000 | INHALATION_SPRAY | Freq: Two times a day (BID) | RESPIRATORY_TRACT | Status: DC

## 2014-12-23 NOTE — Assessment & Plan Note (Addendum)
12/10/2014  Walked RA x 3 laps @ 185 ft each stopped due to  End of study, nl pace for a  rolling walker, no desats or sob - PFT's  12/12/2014  FEV1 1.36 (46% ) ratio 48 no  p no % improvement from saba with DLCO  26 % corrects to 39 % for alv volume s symbicort in  am   - 12/23/2014  extensive coaching HFA effectiveness =   50%   I had an extended final summary discussion with the patient reviewing all relevant studies completed to date and  lasting 15 to 20 minutes of a 25 minute visit on the following issues:    1)  As I explained to this patient in detail:  although there is significant   copd present,   it does not appear to be limiting activity tolerance any more than a set of worn tires limits someone from driving a car  around a parking lot.  A new set of Michelins might look good but would have no perceived impact on the performance of the car and would not be worth the cost.  That is to say:   this pt is so sedentary I don't recommend aggressive pulmonary rx at this point unless limiting symptoms arise or acute exacerbations become as issue, neither of which is the case now.  I asked the patient to contact this office at any time in the future should either of these problems arise.    2) I reviewed the Fletcher curve with the patient that basically indicates  if you quit smoking when your best day FEV1 is still well preserved (as is relatively   the case here with barely a gold III criteria met)  it is highly unlikely you will progress to severe disease and informed the patient there was no medication on the market that has proven to alter the curve/ its downward trajectory  or the likelihood of progression of their disease.  Therefore stopping smoking and maintaining abstinence is the most important aspect of care, not choice of inhalers or for that matter, doctors.    3) pulmonary f/u can be prn in this setting  4) Each maintenance medication was reviewed in detail including most importantly  the difference between maintenance and as needed and under what circumstances the prns are to be used.  Please see instructions for details which were reviewed in writing and the patient given a copy.

## 2014-12-23 NOTE — Progress Notes (Signed)
Subjective:    Patient ID: Travis Romero, male    DOB: 1950-10-17, 64 y.o.   MRN: [id number]  HPI  75 yowm with h/o limitations due to back not breathing quit smoking 10/2014 with GOLD III copd criteria 12/23/2014 s/p admit  Admit date: 10/11/2014 Discharge date: 11/04/2014   Discharge Diagnoses:  Acute on chronic systolic and diastolic CHF Patient Active Problem List   Diagnosis Date Noted  . 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 Urinary retention   . Pleural effusion   . BPH (benign prostatic hypertrophy) with urinary retention   . Coronary artery disease involving native coronary artery of native heart without angina pectoris   . SOB (shortness of breath)   . Tobacco abuse   . HLD (hyperlipidemia)   . Severe peripheral arterial disease 10/18/2014  . NSTEMI (non-ST elevated myocardial infarction)   . Acute on chronic combined systolic and diastolic CHF (congestive heart failure)   . Shortness of breath   . Adjustment disorder with anxious mood   . Acute respiratory failure with hypoxia 10/13/2014  . Acute combined systolic and diastolic CHF, NYHA class 4 10/13/2014  . Acute pulmonary edema   . Endotracheal tube present   . CHF exacerbation 10/12/2014  . Benign essential HTN 10/07/2014  . BPH (benign prostatic hyperplasia) 10/07/2014  . COPD (chronic obstructive pulmonary disease) 10/07/2014  . Smoker 10/07/2014   Discharged Condition: good  History of Present Illness:  [name] is a 64 yo male with known history of COPD, PAD, BPH, HTN, Adjustment disorder, and tobacco abuse. The patient noticed he was becoming more short of breath over the past week. He states he was at work at which time a co-worker noticed he was having difficulty breathing. EMS was called and the patient was transported to the ED. Workup in the ED revealed progressive acute on chronic respiratory failure and required intubation.  Further results revealed the patient to be in acute systolic and diastolic CHF. He was admitted for further management.   Hospital Course:   The patient was weaned and extubated. He was agreesively diuresed. An Echocardiogram was obtained and showed a reduced EF of 20-25% with mild MR. He also underwent cardiac catheterization which revealed 3 vessel CAD. It was felt coronary artery bypass would be indicated and TCTS was consulted. He was evaluated by Dr. Roxan Hockey on 10/18/2014 at which time he was in agreement the patient would require bypass surgery. The risks and benefits of the procedure were explained to the patient and he was agreeable to proceed. The patient was further managed by Cardiology over the next several days.   He was felt medically stable for surgery and was taken to the operating room on 10/23/2014. He underwent CABG x 3 utilizing LIMA to LAD, SVG to OM2, and SVG to distal RCA. He also underwent endoscopic harvest of the greater saphenous vein from the right leg. He tolerated the procedure and was taken to the SICU in stable condition. He was extubated the evening of surgery. During his stay in the SICU, the patient was weaned off inotropes as tolerated. His chest tubes and arterial lines were removed without difficulty. He was tachycardic and hypertensive and his lopressor was changed to Coreg and he was restarted on Clonidine and amlodipine for hypertension. He has been followed by the Advanced Heart Failure Team and it was decided to stop the clonidine and amlodipine and start Entresto due to his low EF. He was agressively diuresed for CHF exacerbation and  hypervolemia with good response to Lasix. Co-ox on 8/1 was 63 with BNP of 699.  His pulmonary status has been an issue postop. The patient's chest x-ray showed bibasilar infiltrates and atelectasis. Due to his severe history of COPD, he was treated with aggressive pulmonary toilet measures and nebulizer treatments.  He was also started on empiric antibiotics for presumed pneumonia with leukocytosis. The patient's respiratory status continued to decline and chest x-ray showed worsening of infiltrates and atelectasis. Pulmonary consult was obtained for further treatment assistance. He underwent ultrasound-guided thoracentesis on 10/30/2014 which removed 1.5L of bloody fluid on the left. A bedside right thoracentesis on 10/31/2014 which removed 1200 ml of cloudy fluid. Cultures were obtained, which have been negative so far.   The patient developed severe constipation. He was treated with disimpaction and enema which was successful. He also had postop urinary retention requiring replacement of his Foley catheter on 7/26. We will plan to leave the Foley in place and plan a voiding trial next week per the rehab MD. Incisions are healing well. He remains on supplemental oxygen. He is tolerating a regular diet. He has completed a course of antibiotics and these have been discontinued.  The patient's main postoperative issue has been related to mobility. He is participating with PT and OT, but remains difficult to mobilize and get out of bed. Recommendation is for transfer to inpatient rehab for further therapies prior to discharge home. At the present time, he is medically stable for transfer once a bed is available.    Consults: pulmonary/intensive care and rehabilitation medicine  Significant Diagnostic Studies: angiography:    Prox RCA lesion, 100% stenosed.  Prox Cx lesion, 100% stenosed.  Dist LAD lesion, 100% stenosed.  2nd Diag lesion, 75% stenosed.  Prox LAD lesion, 60% stenosed.  There is severe left ventricular systolic dysfunction.  Treatments: procedures: thoracentesis- culture negative to date, cytology-no evidence of malignant cells  Surgery:  Median sternotomy, extracorporeal circulation, coronary artery bypass grafting x3 (left internal mammary artery to left anterior descending,  saphenous vein graft to obtuse marginal 2, saphenous vein graft to distal right coronary), endoscopic vein harvest, right leg.         12/10/2014 1st [facility] office visit/ [facility]  On symb 160 / xopenex confused with maint vs prns Chief Complaint  Patient presents with  . Pulmonary Consult     Referred by Dr. Tawni Levy for eval of COPD. Pt states he was dxed with COPD in August 2016. He states that he never had a breathing problem before until recent hospital admission.  He states he gets SOB when he walks too fast.   walking 15-20 min threes times daily at moderate pace / no 02/ denies cough / am congestion which have resolved off cigs rec Plan A = automatic = symbicort 80 Take 2 puffs first thing in am and then another 2 puffs about 12 hours later.   Plan B = Backup Only use your levoalbuterol (xopenex) as a rescue medication to be used if you can't catch your breath by resting or doing a relaxed purse lip breathing pattern.  - The less you use it, the better it will work when you need it. - Ok to use up to 2 puffs  every 4 hours if you must but call for immediate appointment if use goes up over your usual need - Don't leave home without it !!  (think of it like the spare tire for your car)   Ok to stop  incentive spirometry (clear plastic inspirtory device)   Ok to continue the flutter valve (green blow in device) as needed for now       12/23/2014 f/u ov/[facility] re: GOLD III copd/ still craving cigs but not smoking  Chief Complaint  Patient presents with  . Follow-up    Pt had PFT's on 12/12/14. His breathing is unchanged since the last visit. No new co's today.   still walking regularly but very slowly with doe = MMRC2 = can't walk a nl pace on a flat grade s sob  No obvious day to day or daytime variability or assoc chronic cough or cp or chest tightness, subjective wheeze or overt sinus or hb symptoms. No unusual exp hx or h/o childhood pna/ asthma or knowledge of  premature birth.  Sleeping ok without nocturnal  or early am exacerbation  of respiratory  c/o's or need for noct saba. Also denies any obvious fluctuation of symptoms with weather or environmental changes or other aggravating or alleviating factors except as outlined above   Current Medications, Allergies, Complete Past Medical History, Past Surgical History, Family History, and Social History were reviewed in [employer] record.  ROS  The following are not active complaints unless bolded sore throat, dysphagia, dental problems, itching, sneezing,  nasal congestion or excess/ purulent secretions, ear ache,   fever, chills, sweats, unintended wt loss, classically pleuritic or exertional cp, hemoptysis,  orthopnea pnd or leg swelling, presyncope, palpitations, abdominal pain, anorexia, nausea, vomiting, diarrhea  or change in bowel or bladder habits, change in stools or urine, dysuria,hematuria,  rash, arthralgias, visual complaints, headache, numbness, weakness or ataxia or problems with walking =- uses 2 wheel walker or coordination,  change in mood/affect or memory.                   Objective:   Physical Exam  amb wm somber but nad/ 2 wheel walker   12/23/2014         135 Wt Readings from Last 3 Encounters:  12/10/14 136 lb 12.8 oz (62.052 kg)  12/03/14 138 lb (62.596 kg)  11/26/14 138 lb 14.2 oz (63 kg)    Vital signs reviewed    HEENT: nl dentition, turbinates, and orophanx. Nl external ear canals without cough reflex   NECK :  without JVD/Nodes/TM/ nl carotid upstrokes bilaterally   LUNGS: no acc muscle use, clear to A and P bilaterally without cough on insp or exp maneuvers   CV:  RRR  no s3 or murmur or increase in P2, no edema   ABD:  soft and nontender with nl excursion in the supine position. No bruits or organomegaly, bowel sounds nl  MS:  warm without deformities, calf tenderness, cyanosis or clubbing  SKIN: warm and dry without lesions      NEURO:  alert, approp, no deficits   . I personally reviewed images and agree with radiology impression as follows:  CXR:  12/03/14  Improved appearance of the lung bases with resolution of the right pleural effusion and decreased left pleural effusion. There is no pulmonary edema. There are stable bullous changes in the right upper lobe.           Assessment & Plan:   Outpatient Encounter Prescriptions as of 12/23/2014  Medication Sig  . ALPRAZolam (XANAX) 0.5 MG tablet Take 1 tablet (0.5 mg total) by mouth 4 (four) times daily as needed for anxiety.  Marland Kitchen aspirin EC 325 MG EC tablet Take 1 tablet (  325 mg total) by mouth daily.  Marland Kitchen atorvastatin (LIPITOR) 80 MG tablet Take 1 tablet (80 mg total) by mouth daily at 6 PM.  . budesonide-formoterol (SYMBICORT) 80-4.5 MCG/ACT inhaler Inhale 2 puffs into the lungs 2 (two) times daily.  . caffeine 200 MG TABS tablet Take 200 mg by mouth daily.  . carvedilol (COREG) 12.5 MG tablet Take 1 tablet (12.5 mg total) by mouth 2 (two) times daily with a meal.  . digoxin (LANOXIN) 0.125 MG tablet Take 1 tablet (0.125 mg total) by mouth daily.  Marland Kitchen dutasteride (AVODART) 0.5 MG capsule Take 1 capsule (0.5 mg total) by mouth daily.  Marland Kitchen FIBER, CORN DEXTRIN, PO Take 1 tablet by mouth daily.  . furosemide (LASIX) 40 MG tablet Take 1 tablet (40 mg total) by mouth daily.  Marland Kitchen levalbuterol (XOPENEX HFA) 45 MCG/ACT inhaler Inhale 1-2 puffs into the lungs every 6 (six) hours as needed for wheezing or shortness of breath.  . methocarbamol (ROBAXIN) 500 MG tablet Take 1 tablet (500 mg total) by mouth every 6 (six) hours as needed for muscle spasms.  . mirtazapine (REMERON) 30 MG tablet Take 30 mg by mouth at bedtime.  . nicotine (NICODERM CQ - DOSED IN MG/24 HOURS) 21 mg/24hr patch Place 1 patch (21 mg total) onto the skin daily.  Marland Kitchen oxyCODONE (OXY IR/ROXICODONE) 5 MG immediate release tablet Take 1-2 tablets (5-10 mg total) by mouth every 6 (six) hours as needed for  severe pain.  . pantoprazole (PROTONIX) 40 MG tablet Take 1 tablet (40 mg total) by mouth daily.  . sacubitril-valsartan (ENTRESTO) 97-103 MG Take 1 tablet by mouth 2 (two) times daily.  Marland Kitchen senna-docusate (SENOKOT-S) 8.6-50 MG per tablet Take 2 tablets by mouth at bedtime.  Marland Kitchen spironolactone (ALDACTONE) 25 MG tablet Take 0.5 tablets (12.5 mg total) by mouth daily.  . tamsulosin (FLOMAX) 0.4 MG CAPS capsule Take 2 capsules (0.8 mg total) by mouth daily after supper.  . traZODone (DESYREL) 50 MG tablet Take 50 mg by mouth at bedtime.  . budesonide-formoterol (SYMBICORT) 80-4.5 MCG/ACT inhaler Inhale 2 puffs into the lungs 2 (two) times daily.   No facility-administered encounter medications on file as of 12/23/2014.

## 2014-12-23 NOTE — Patient Instructions (Addendum)
You have barely a GOLD III moderately severe and will not worsen unless you resume smoking  Continue symbicort 80 Take 2 puffs first thing in am and then another 2 puffs about 12 hours later.  Only use your albuterol as a rescue medication to be used if you can't catch your breath by resting or doing a relaxed purse lip breathing pattern.  - The less you use it, the better it will work when you need it. - Ok to use up to 2 puffs  every 4 hours if you must but call for immediate appointment if use goes up over your usual need - Don't leave home without it !!  (think of it like the spare tire for your car)    If you are satisfied with your treatment plan,  let your doctor know and he/she can either refill your medications or you can return here when your prescription runs out.     If in any way you are not 100% satisfied,  please tell us.  If 100% better, tell your friends!  Pulmonary follow up is as needed

## 2014-12-24 ENCOUNTER — Other Ambulatory Visit: Payer: Self-pay | Admitting: *Deleted

## 2014-12-24 DIAGNOSIS — G8918 Other acute postprocedural pain: Secondary | ICD-10-CM

## 2014-12-24 MED ORDER — HYDROCODONE-ACETAMINOPHEN 5-325 MG PO TABS: 1.0000 | ORAL_TABLET | Freq: Four times a day (QID) | ORAL | Status: AC | PRN

## 2014-12-24 NOTE — Telephone Encounter (Signed)
Travis Romero has called requesting a pain med refill for Oxycodone s/p CABG 10/23/14. I said that since he was 2 months post op we would decrease his med to [facility] and he could pick up a new signed script at our office.  This message was left on his ansering machine on 12/23/14.

## 2015-01-06 ENCOUNTER — Other Ambulatory Visit: Payer: Self-pay | Admitting: Thoracic Surgery (Cardiothoracic Vascular Surgery)

## 2015-01-06 DIAGNOSIS — J90 Pleural effusion, not elsewhere classified: Secondary | ICD-10-CM

## 2015-01-07 ENCOUNTER — Encounter: Payer: Self-pay | Admitting: Physical Medicine & Rehabilitation

## 2015-01-07 ENCOUNTER — Ambulatory Visit (INDEPENDENT_AMBULATORY_CARE_PROVIDER_SITE_OTHER): Payer: Self-pay | Admitting: Thoracic Surgery (Cardiothoracic Vascular Surgery)

## 2015-01-07 ENCOUNTER — Encounter: Payer: Self-pay | Admitting: Thoracic Surgery (Cardiothoracic Vascular Surgery)

## 2015-01-07 ENCOUNTER — Ambulatory Visit
Admission: RE | Admit: 2015-01-07 | Discharge: 2015-01-07 | Disposition: A | Discharge status: Home / Self Care | Payer: PRIVATE HEALTH INSURANCE | Source: Ambulatory Visit | Attending: Thoracic Surgery (Cardiothoracic Vascular Surgery) | Admitting: Thoracic Surgery (Cardiothoracic Vascular Surgery)

## 2015-01-07 ENCOUNTER — Encounter
Payer: PRIVATE HEALTH INSURANCE | Attending: Physical Medicine & Rehabilitation | Admitting: Physical Medicine & Rehabilitation

## 2015-01-07 VITALS — BP 178/100 | HR 93 | Resp 16 | Ht 67.0 in | Wt 135.0 lb

## 2015-01-07 VITALS — BP 169/92 | HR 101

## 2015-01-07 DIAGNOSIS — J948 Other specified pleural conditions: Secondary | ICD-10-CM

## 2015-01-07 DIAGNOSIS — G9340 Encephalopathy, unspecified: Secondary | ICD-10-CM | POA: Insufficient documentation

## 2015-01-07 DIAGNOSIS — Z87891 Personal history of nicotine dependence: Secondary | ICD-10-CM | POA: Diagnosis not present

## 2015-01-07 DIAGNOSIS — J449 Chronic obstructive pulmonary disease, unspecified: Secondary | ICD-10-CM | POA: Insufficient documentation

## 2015-01-07 DIAGNOSIS — I509 Heart failure, unspecified: Secondary | ICD-10-CM | POA: Diagnosis not present

## 2015-01-07 DIAGNOSIS — M545 Low back pain, unspecified: Secondary | ICD-10-CM | POA: Insufficient documentation

## 2015-01-07 DIAGNOSIS — J9600 Acute respiratory failure, unspecified whether with hypoxia or hypercapnia: Secondary | ICD-10-CM | POA: Diagnosis not present

## 2015-01-07 DIAGNOSIS — R5381 Other malaise: Secondary | ICD-10-CM | POA: Diagnosis not present

## 2015-01-07 DIAGNOSIS — I10 Essential (primary) hypertension: Secondary | ICD-10-CM | POA: Insufficient documentation

## 2015-01-07 DIAGNOSIS — D291 Benign neoplasm of prostate: Secondary | ICD-10-CM | POA: Insufficient documentation

## 2015-01-07 DIAGNOSIS — J90 Pleural effusion, not elsewhere classified: Secondary | ICD-10-CM

## 2015-01-07 DIAGNOSIS — G8929 Other chronic pain: Secondary | ICD-10-CM | POA: Insufficient documentation

## 2015-01-07 DIAGNOSIS — Z951 Presence of aortocoronary bypass graft: Secondary | ICD-10-CM

## 2015-01-07 NOTE — Progress Notes (Signed)
[address]       [place] [address]             [phone]       HPI: Mr. Travis Romero   He is a 64 year old man who presented with acute respiratory failure due to congestive heart failure and a non-ST elevation MI. 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 He a coronary bypass grafting 3 on July 20. His postoperative course was slow. He had issues with severe deconditioning, pain control, urinary retention, and urinary tract infection. He went to rehabilitation for deconditioning. He was discharged from rehabilitation after about 3 weeks.  I last saw him in the office on August 30. At that time he was making good progress but did have a small left effusion. I gave him a prednisone taper. He now returns for follow-up regarding that issue.  He is feeling better. He's not taking anything for pain. He does still have some mild tenderness associated with the incision. He's not had any swelling in his legs. He's been walking on a regular basis. He was told he did not need cardiac rehabilitation because he is doing well enough on his own.    Past Medical History  Diagnosis Date  . Hypertension   . COPD (chronic obstructive pulmonary disease) ([place])   . BPH (benign prostatic hyperplasia)   . Back pain 2007    after fall down steps    Current Outpatient Prescriptions  Medication Sig Dispense Refill  . ALPRAZolam (XANAX) 0.5 MG tablet Take 1 tablet (0.5 mg total) by mouth 4 (four) times daily as needed for anxiety. 120 tablet 0  . aspirin EC 325 MG EC tablet Take 1 tablet (325 mg total) by mouth daily. 30 tablet 0  . atorvastatin (LIPITOR) 80 MG tablet Take 1 tablet (80 mg total) by mouth daily at 6 PM. 30 tablet 1  . budesonide-formoterol (SYMBICORT) 80-4.5 MCG/ACT inhaler Inhale 2 puffs into the lungs 2 (two) times daily. 1 Inhaler 1  . budesonide-formoterol (SYMBICORT) 80-4.5 MCG/ACT inhaler Inhale 2 puffs into the  lungs 2 (two) times daily. 1 Inhaler 0  . caffeine 200 MG TABS tablet Take 200 mg by mouth daily.    . carvedilol (COREG) 12.5 MG tablet Take 1 tablet (12.5 mg total) by mouth 2 (two) times daily with a meal. 60 tablet 1  . digoxin (LANOXIN) 0.125 MG tablet Take 1 tablet (0.125 mg total) by mouth daily. 30 tablet 1  . dutasteride (AVODART) 0.5 MG capsule Take 1 capsule (0.5 mg total) by mouth daily. 30 capsule 1  . FIBER, CORN DEXTRIN, PO Take 1 tablet by mouth daily.    . furosemide (LASIX) 40 MG tablet Take 1 tablet (40 mg total) by mouth daily. 60 tablet 1  . HYDROcodone-acetaminophen (NORCO) 5-325 MG per tablet Take 1 tablet by mouth every 6 (six) hours as needed for moderate pain. 40 tablet 0  . levalbuterol (XOPENEX HFA) 45 MCG/ACT inhaler Inhale 1-2 puffs into the lungs every 6 (six) hours as needed for wheezing or shortness of breath. 1 Inhaler 12  . methocarbamol (ROBAXIN) 500 MG tablet Take 1 tablet (500 mg total) by mouth every 6 (six) hours as needed for muscle spasms. 90 tablet 1  . mirtazapine (REMERON) 30 MG tablet Take 30 mg by mouth at bedtime.    . nicotine (NICODERM CQ - DOSED IN MG/24 HOURS) 21 mg/24hr patch Place 1 patch (21  mg total) onto the skin daily. 28 patch 0  . pantoprazole (PROTONIX) 40 MG tablet Take 1 tablet (40 mg total) by mouth daily. 30 tablet 1  . sacubitril-valsartan (ENTRESTO) 97-103 MG Take 1 tablet by mouth 2 (two) times daily. 60 tablet 6  . senna-docusate (SENOKOT-S) 8.6-50 MG per tablet Take 2 tablets by mouth at bedtime. 60 tablet 1  . spironolactone (ALDACTONE) 25 MG tablet Take 0.5 tablets (12.5 mg total) by mouth daily. 30 tablet 1  . tamsulosin (FLOMAX) 0.4 MG CAPS capsule Take 2 capsules (0.8 mg total) by mouth daily after supper. 60 capsule 1  . traZODone (DESYREL) 50 MG tablet Take 50 mg by mouth at bedtime.     No current facility-administered medications for this visit.    Physical Exam BP 178/100 mmHg  Pulse 93  Resp 16  Ht 5\' 7"  (1.702  m)  Wt 135 lb (61.236 kg)  BMI 21.14 kg/m2  SpO17 16% 64 year old man in no acute distress Lungs clear with equal breath sounds bilaterally Cardiac regular rate and rhythm positive S4 Sternal incision well-healed, sternum stable No peripheral edema, chronic venous stasis changes  Diagnostic Tests: I reviewed his chest x-ray. It shows hyperinflation consistent with COPD. There is no edema or effusion.  Impression: 64 year old man who is now 10 weeks out from coronary bypass grafting 3. He's had a relatively slow postoperative course which is not surprising given his presentation. At this point I'm very pleased with his progress.  Hypertension - his blood pressure is elevated today. He thinks he took all of his blood pressure medications but is not 100% sure. He has a blood pressure cuff at home. He will check it regularly over the next several days and make sure that there is no trend to having an elevated blood pressure. If there is he will contact cardiology.  Deconditioning- I personally think he would benefit from cardiac rehabilitation. I think he can learn to maximize benefit from exercise. He will check cardiology on that.   Left pleural effusion - resolved.  Plan:  Check blood pressure daily as instructed  I will be happy to see Mr. Sattar back at any time the future if I can be of any further assistance with his care  Melrose Nakayama, MD [facility] [phone]

## 2015-01-07 NOTE — Progress Notes (Signed)
Subjective:    Patient ID: Travis Romero, male    DOB: 08-03-1950, 64 y.o.   MRN: [id number]  HPI   Travis Romero is here in follow up of his rehab stay. He has been home about a month. He is exercising on his own at home. He has driven his care and has been independent within his home. His cousin is here and feels that his memory is still an issue. He would like to return to work, but does realize that he's not quite ready. His memory is returning to baseline. He still has weakness in his left hand. He recalls having weakness as a child.   He is having ongoing weakness in his left hand. We had discussed this in the hospital. He recalls having weakness and neck pain as a child. He is having a hard time typing and holding things with the left hand. He has had long term pain in his neck as well, particulalry on the left side.  He does feel that the hand has gotten a little stronger since the hospitalization. He recalls having an EMG/NCS on the LUE as a child.  He has a ncs/emg set up at [facility] later this month  He sees surgery today and cardiology next week. He feels that his exercise capacity has increased quite a bit. He denies any chest pain. He does complain of chronic low back pain. He recalls having herniated discs in his back    Pain Inventory Average Pain 5 Pain Right Now 5 My pain is constant  In the last 24 hours, has pain interfered with the following? General activity 3 Relation with others 0 Enjoyment of life 3 What TIME of day is your pain at its worst? na Sleep (in general) Fair  Pain is worse with: sitting Pain improves with: medication Relief from Meds: na  Mobility walk without assistance  Function I need assistance with the following:  meal prep, household duties and shopping  Neuro/Psych No problems in this area  Prior Studies Any changes since last visit?  no  Physicians involved in your care Any changes since last visit?  no   Family History    Problem Relation Age of Onset  . Hypertension Mother   . Heart disease Father    Social History   Social History  . Marital Status: Divorced    Spouse Name: n/a  . Number of Children: 0  . Years of Education: master's   Occupational History  . licensed clinical social worker     Warden/ranger   Social History Main Topics  . Smoking status: Former Smoker -- 1.50 packs/day for 20 years    Types: Cigarettes    Quit date: 10/11/2014  . Smokeless tobacco: Never Used  . Alcohol Use: 1.2 - 1.8 oz/week    2-3 Standard drinks or equivalent per week     Comment: 10/2014 " I drink everyday after supper " 2 glasses of wine   . Drug Use: No  . Sexual Activity: Not Asked   Other Topics Concern  . None   Social History Narrative   Lives alone.   A cousin, [name], lives nearby.   Past Surgical History  Procedure Laterality Date  . Tonsillectomy    . Pyloromyotomy      pyloric stenosis  . Cardiac catheterization N/A 10/17/2014    Procedure: Right/Left Heart Cath and Coronary Angiography;  Surgeon: Troy Sine, MD;  Location: [facility];  Service: Cardiovascular;  Laterality: N/A;  .  Coronary artery bypass graft N/A 10/23/2014    Procedure:   CORONARY ARTERY BYPASS GRAFTING (CABG)X3 LIMA-LAD;SVG to OM,SVG to Distal RCA;  Surgeon: Melrose Nakayama, MD;  Location: [facility];  Service: Open Heart Surgery;  Laterality: N/A;  . Tee without cardioversion N/A 10/23/2014    Procedure: TRANSESOPHAGEAL ECHOCARDIOGRAM (TEE);  Surgeon: Melrose Nakayama, MD;  Location: [facility];  Service: Open Heart Surgery;  Laterality: N/A;   Past Medical History  Diagnosis Date  . Hypertension   . COPD (chronic obstructive pulmonary disease) ([place])   . BPH (benign prostatic hyperplasia)   . Back pain 2007    after fall down steps   BP 169/92 mmHg  Pulse 101  SpO2 97%  Opioid Risk Score:   Fall Risk Score:  `1  Depression screen PHQ 2/9  Depression screen PHQ 2/9 01/07/2015  Decreased Interest 0   Down, Depressed, Hopeless 0  PHQ - 2 Score 0  Altered sleeping 2  Tired, decreased energy 1  Change in appetite 0  Feeling bad or failure about yourself  0  Trouble concentrating 0  Moving slowly or fidgety/restless 0  Suicidal thoughts 0  PHQ-9 Score 3     Review of Systems  All other systems reviewed and are negative.      Objective:   Physical Exam  Constitutional: He is oriented to person, place, and time. He appears well-developed and well-nourished.  Frail appearing male  HENT: oral mucosa a little dry. Voice stronger Head: Normocephalic and atraumatic.  Eyes: Conjunctivae are normal. Pupils are equal, round, and reactive to light.  Neck: Normal range of motion. Neck supple. Tenderness over upper traps Cardiovascular: Normal rate and regular rhythm. no murmurs, rubs, gallops Respiratory: Effort normal. Equal BS. He has no wheezes. No distress GI: Soft. Bowel sounds are normal. He exhibits no distension. There is no tenderness.  Musculoskeletal: pain along left neck with flexion and extension. Spurling's equivocal. +TP left mid trap.  Neurological: He is alert and oriented to person, place, and time. Reasonable insight and awareness.  Dysphonic speech mild. Able to follow one and two step commands without difficulty.  UE: 4+ deltoid, 4+ bicep,right tricep, wrist, hi LUE: tricep 4, wrist 4, HI 3, 4th/5th finger drop with weakness, +Froman's.  LE: 4+ hf, 4+ ke and 4+ adf/apf  Skin: Skin is warm and dry.  Midline sternal incision CDI.  Psychiatric: Thought content generally normal. Has some short term memory and attention deficits. Occasionally loses track of conversation. Associations are sometime loose. Insight and awareness are fair.   Assessment/Plan: 1. Functional deficits secondary to debility after acute respiratory failure and CHF exacerbation s/p CABG. -continue HEP, follow up with cards for formal recs. Probably doesn't need formal  cardiac rehab  -NCS/EMG per [facility]----?consider imaging of neck. I suspect this is a cervical radic. ?Left C8, T1.  2.Encephalopathy:  -resolving  -will make a referral for neuropsych testing as he's not back to baseline. Concerned that he's not at a level where he can function in his job. 3. Pain Management: oxycodone prn---observe activity tolerance with therapy- Back pain upper lumbar hx DDD - 4.   CHF: per [facility] 10 COPD: per primary, pulmonology    Follow up with me in about a month.  Thirty minutes of face to face patient care time were spent during this visit. All questions were encouraged and answered.

## 2015-01-07 NOTE — Patient Instructions (Addendum)
PLEASE CALL ME WITH ANY PROBLEMS OR QUESTIONS (#[phone]).  HAVE A GOOD DAY!    

## 2015-01-10 ENCOUNTER — Ambulatory Visit (HOSPITAL_COMMUNITY)
Admission: RE | Admit: 2015-01-10 | Discharge: 2015-01-10 | Disposition: A | Discharge status: Home / Self Care | Payer: PRIVATE HEALTH INSURANCE | Source: Ambulatory Visit | Attending: Internal Medicine | Admitting: Internal Medicine

## 2015-01-10 VITALS — BP 144/90 | HR 103 | Wt 133.8 lb

## 2015-01-10 DIAGNOSIS — I10 Essential (primary) hypertension: Secondary | ICD-10-CM | POA: Insufficient documentation

## 2015-01-10 DIAGNOSIS — Z79899 Other long term (current) drug therapy: Secondary | ICD-10-CM | POA: Insufficient documentation

## 2015-01-10 DIAGNOSIS — I5022 Chronic systolic (congestive) heart failure: Secondary | ICD-10-CM | POA: Diagnosis not present

## 2015-01-10 DIAGNOSIS — Z951 Presence of aortocoronary bypass graft: Secondary | ICD-10-CM | POA: Insufficient documentation

## 2015-01-10 DIAGNOSIS — Z8249 Family history of ischemic heart disease and other diseases of the circulatory system: Secondary | ICD-10-CM | POA: Insufficient documentation

## 2015-01-10 DIAGNOSIS — I2510 Atherosclerotic heart disease of native coronary artery without angina pectoris: Secondary | ICD-10-CM

## 2015-01-10 DIAGNOSIS — Z87891 Personal history of nicotine dependence: Secondary | ICD-10-CM | POA: Diagnosis not present

## 2015-01-10 DIAGNOSIS — J449 Chronic obstructive pulmonary disease, unspecified: Secondary | ICD-10-CM | POA: Diagnosis not present

## 2015-01-10 DIAGNOSIS — Z7982 Long term (current) use of aspirin: Secondary | ICD-10-CM | POA: Insufficient documentation

## 2015-01-10 DIAGNOSIS — R339 Retention of urine, unspecified: Secondary | ICD-10-CM | POA: Insufficient documentation

## 2015-01-10 DIAGNOSIS — I428 Other cardiomyopathies: Secondary | ICD-10-CM | POA: Diagnosis not present

## 2015-01-10 LAB — BASIC METABOLIC PANEL
ANION GAP: 9 (ref 5–15)
BUN: 27 mg/dL — ABNORMAL HIGH (ref 6–20)
CO2: 27 mmol/L (ref 22–32)
Calcium: 9.5 mg/dL (ref 8.9–10.3)
Chloride: 102 mmol/L (ref 101–111)
Creatinine, Ser: 1.27 mg/dL — ABNORMAL HIGH (ref 0.61–1.24)
GFR, EST NON AFRICAN AMERICAN: 58 mL/min — AB (ref >60)
GLUCOSE: 106 mg/dL — AB (ref 65–99)
POTASSIUM: 4.6 mmol/L (ref 3.5–5.1)
Sodium: 138 mmol/L (ref 135–145)

## 2015-01-10 MED ORDER — CARVEDILOL 12.5 MG PO TABS: 18.7500 mg | ORAL_TABLET | Freq: Two times a day (BID) | ORAL | Status: DC

## 2015-01-10 NOTE — Progress Notes (Signed)
Patient ID: Travis Romero, male   DOB: Feb 13, 1951, 64 y.o.   MRN: [id number]   ADVANCED HF CLINIC NOTE .   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 Had [place] that showed 3 vessel disease and EF 20-25% with subsequent CABG x3. Post operative course was complicated by HF, respiratory distress, pleural effusion, and deconditioning. Once stable he was admitted to inpatient rehab August 1st through August 23. Discharge weight was 138 pounds.   At last visit volume was up. Increased lasix 40 mg twice a day for 3 days then resumed lasix 40 mg daily. Since we have seen him he has been seen by Pulmonary, TCTS and Dr. Tessa Romero. Referred for neurocognitive testing.   He returns for HF follow up. Overall feeling stronger. Main complaint is ongoing arthritis pain and wants pain meds. Denies SOB/PND/Orthopnea/CP. Edema improved. Taking all medications. Not smoking. [facility] following. BP running 140/90-100    Studies ECHO 12/2014 -EF 20-25%. Grade IDD  LHC 10/17/2014   Prox RCA lesion, 100% stenosed.  Prox Cx lesion, 100% stenosed.  Dist LAD lesion, 100% stenosed.  2nd Diag lesion, 75% stenosed.  Prox LAD lesion, 60% stenosed.  There is severe left ventricular systolic dysfunction.  ROS: All systems negative except as listed in HPI, PMH and Problem List.  SH:  Social History   Social History  . Marital Status: Divorced    Spouse Name: n/a  . Number of Children: 0  . Years of Education: master's   Occupational History  . licensed clinical social worker     Warden/ranger   Social History Main Topics  . Smoking status: Former Smoker -- 1.50 packs/day for 20 years    Types: Cigarettes    Quit  date: 10/11/2014  . Smokeless tobacco: Never Used  . Alcohol Use: 1.2 - 1.8 oz/week    2-3 Standard drinks or equivalent per week     Comment: 10/2014 " I drink everyday after supper " 2 glasses of wine   . Drug Use: No  . Sexual Activity: Not on file   Other Topics Concern  . Not on file   Social History Narrative   Lives alone.   A cousin, Travis Romero, lives nearby.    FH:  Family History  Problem Relation Age of Onset  . Hypertension Mother   . Heart disease Father     Past Medical History  Diagnosis Date  . Hypertension   . COPD (chronic obstructive pulmonary disease) ([place])   . BPH (benign prostatic hyperplasia)   . Back pain 2007    after fall down steps    Current Outpatient Prescriptions  Medication Sig Dispense Refill  . ALPRAZolam (XANAX) 0.5 MG tablet Take 1 tablet (0.5 mg total) by mouth 4 (four) times daily as needed for anxiety. 120 tablet 0  . aspirin EC 325 MG EC tablet Take 1 tablet (325 mg total) by mouth daily. 30 tablet 0  . atorvastatin (LIPITOR) 80 MG tablet Take 1 tablet (80 mg total) by mouth daily at 6 PM. 30 tablet 1  . budesonide-formoterol (SYMBICORT) 80-4.5 MCG/ACT inhaler Inhale 2 puffs into the lungs 2 (two) times daily. 1 Inhaler 1  . caffeine 200 MG TABS tablet Take  200 mg by mouth daily.    . carvedilol (COREG) 12.5 MG tablet Take 1 tablet (12.5 mg total) by mouth 2 (two) times daily with a meal. 60 tablet 1  . digoxin (LANOXIN) 0.125 MG tablet Take 1 tablet (0.125 mg total) by mouth daily. 30 tablet 1  . dutasteride (AVODART) 0.5 MG capsule Take 1 capsule (0.5 mg total) by mouth daily. 30 capsule 1  . FIBER, CORN DEXTRIN, PO Take 1 tablet by mouth daily.    . furosemide (LASIX) 40 MG tablet Take 1 tablet (40 mg total) by mouth daily. 60 tablet 1  . HYDROcodone-acetaminophen (NORCO) 5-325 MG per tablet Take 1 tablet by mouth every 6 (six) hours as needed for moderate pain. 40 tablet 0  . levalbuterol (XOPENEX HFA) 45 MCG/ACT inhaler Inhale 1-2  puffs into the lungs every 6 (six) hours as needed for wheezing or shortness of breath. 1 Inhaler 12  . methocarbamol (ROBAXIN) 500 MG tablet Take 1 tablet (500 mg total) by mouth every 6 (six) hours as needed for muscle spasms. 90 tablet 1  . mirtazapine (REMERON) 30 MG tablet Take 30 mg by mouth at bedtime.    . nicotine (NICODERM CQ - DOSED IN MG/24 HOURS) 21 mg/24hr patch Place 1 patch (21 mg total) onto the skin daily. 28 patch 0  . pantoprazole (PROTONIX) 40 MG tablet Take 1 tablet (40 mg total) by mouth daily. 30 tablet 1  . sacubitril-valsartan (ENTRESTO) 97-103 MG Take 1 tablet by mouth 2 (two) times daily. 60 tablet 6  . senna-docusate (SENOKOT-S) 8.6-50 MG per tablet Take 2 tablets by mouth at bedtime. 60 tablet 1  . spironolactone (ALDACTONE) 25 MG tablet Take 0.5 tablets (12.5 mg total) by mouth daily. 30 tablet 1  . tamsulosin (FLOMAX) 0.4 MG CAPS capsule Take 2 capsules (0.8 mg total) by mouth daily after supper. 60 capsule 1  . traMADol (ULTRAM) 50 MG tablet Take 50 mg by mouth every 6 (six) hours as needed for moderate pain.    . traZODone (DESYREL) 50 MG tablet Take 50 mg by mouth at bedtime.     No current facility-administered medications for this encounter.    Filed Vitals:   01/10/15 1424  BP: 144/90  Pulse: 103  Weight: 133 lb 12.8 oz (60.691 kg)  SpO2: 93%    PHYSICAL EXAM:  General:  Well appearing. No resp difficulty HEENT: normal Neck: supple. JVP 6-7. Carotids 2+ bilaterally; no bruits. No lymphadenopathy or thryomegaly appreciated. Cor: PMI normal. Regular rate & rhythm. No rubs, gallops or murmurs. Lungs:Decreased throughout. No wheezing Abdomen: soft, nontender, nondistended. No hepatosplenomegaly. No bruits or masses. Good bowel sounds. Extremities: no cyanosis, clubbing, rash, R and LLE tr edema Neuro: alert & orientedx3, cranial nerves grossly intact. Moves all 4 extremities w/o difficulty. Affect pleasant.   ASSESSMENT & PLAN:  1. Chronic  Systolic HF- ICM per [facility] ECHO 10/12/14 EF 20%  - NYHA II-III. Volume status looks good - Continue 12.5 mg spiro daily  - Increase carvedilol to 18.75 mg twice a day.   - Continue Entresto to 97-103 twice a day . Check BMET  -Plan to repeat ECHO in 3-4 months after HF med optimized.  Check BMET  2. CABG x3 10/2014-  IMA-LAD;SVG to OM,SVG to Distal RCA (N/A). On statin, bb, aspirin  3. HTN- Better controlled but still elevated. Increasing carvedilol. May need amlodipine 4.Urinary Retention- Followed by Urology. On flomax.  5. COPD GOLD 3,   --PFT's 12/12/2014 FEV1 1.36 (46% )  ratio 48 no p no % improvement from saba with DLCO 26 % corrects to 39 % for alv volume s symbicort in am  Overall improving. Refuses CR as he is exercising at home.   Glori Bickers MD 2:59 PM

## 2015-01-10 NOTE — Progress Notes (Signed)
Advanced Heart Failure Medication Review by a Pharmacist  Does the patient  feel that his/her medications are working for him/her?  yes  Has the patient been experiencing any side effects to the medications prescribed?  no  Does the patient measure his/her own blood pressure or blood glucose at home?  yes   Does the patient have any problems obtaining medications due to transportation or finances?   no  Understanding of regimen: good Understanding of indications: good Potential of compliance: good Patient understands to avoid NSAIDs. Patient understands to avoid decongestants.  Issues to address at subsequent visits: None   Pharmacist comments: Travis Romero is a pleasant 64 yo M presenting with a current medication list. He reports good compliance with all of his medications. He is complaining of chronic back pain 2/2 DDD for which he ran out of his Vicodin and has noticed an increase in BP since. He is asking if we would be willing to refer him to Algis Liming, PA at Dr. Charm Barges office. He did not have any other medication-related questions or concerns for me at this time.   [name], PharmD, BCPS, CPP Clinical Pharmacist Pager: [phone] Phone: [phone] 01/10/2015 2:42 PM    Time with patient: 6 minutes Preparation and documentation time: 2 minutes Total time: 8 minutes

## 2015-01-10 NOTE — Addendum Note (Signed)
Encounter addended by: Kerry Dory, CMA on: 01/10/2015  3:22 PM<BR>     Documentation filed: Patient Instructions Section, Dx Association, Orders

## 2015-01-10 NOTE — Patient Instructions (Signed)
INCREASE Carvedilol to 18.75 mg (one and and one half tab) twice a day  Labs today  Please keep you follow up appointment with Dr. Naaman Plummer at [facility] on 02/05/15, we are unable to make an with Algis Liming as this provider only works in the hospital.  Your physician recommends that you schedule a follow-up appointment in: 2 months with a echo  Your physician has requested that you have an echocardiogram. Echocardiography is a painless test that uses sound waves to create images of your heart. It provides your doctor with information about the size and shape of your heart and how well your heart's chambers and valves are working. This procedure takes approximately one hour. There are no restrictions for this procedure.  Do the following things EVERYDAY: 1) Weigh yourself in the morning before breakfast. Write it down and keep it in a log. 2) Take your medicines as prescribed 3) Eat low salt foods-Limit salt (sodium) to 2000 mg per day.  4) Stay as active as you can everyday 5) Limit all fluids for the day to less than 2 liters 6)

## 2015-01-15 ENCOUNTER — Telehealth (HOSPITAL_COMMUNITY): Payer: Self-pay

## 2015-01-15 NOTE — Telephone Encounter (Signed)
Patient would like a letter from Dr. Haroldine Laws stating that he can return to work the first week of November.

## 2015-01-16 NOTE — Telephone Encounter (Signed)
i need to see him one more time before clearing

## 2015-01-17 ENCOUNTER — Other Ambulatory Visit: Payer: Self-pay | Admitting: Physical Medicine and Rehabilitation

## 2015-01-17 ENCOUNTER — Telehealth (HOSPITAL_COMMUNITY): Payer: Self-pay

## 2015-01-17 MED ORDER — DIGOXIN 125 MCG PO TABS: 0.1250 mg | ORAL_TABLET | Freq: Every day | ORAL | Status: AC

## 2015-01-17 NOTE — Telephone Encounter (Signed)
Patient needing a refill on Digoxin.  Please call [phone]

## 2015-01-17 NOTE — Telephone Encounter (Signed)
Re ordered digoxin per his request

## 2015-01-19 ENCOUNTER — Other Ambulatory Visit: Payer: Self-pay | Admitting: Physical Medicine and Rehabilitation

## 2015-01-20 ENCOUNTER — Other Ambulatory Visit: Payer: Self-pay | Admitting: Physical Medicine and Rehabilitation

## 2015-01-22 DIAGNOSIS — Z736 Limitation of activities due to disability: Secondary | ICD-10-CM

## 2015-01-23 ENCOUNTER — Encounter: Payer: PRIVATE HEALTH INSURANCE | Admitting: Diagnostic Neuroimaging

## 2015-01-23 NOTE — Telephone Encounter (Signed)
Called patient, LVM . Told him that Dr. Haroldine Laws would like to see him in the office for one more visit before he gives a return to work note.  Asked him to call back and schedule an appointment

## 2015-01-27 ENCOUNTER — Encounter: Payer: PRIVATE HEALTH INSURANCE | Admitting: Diagnostic Neuroimaging

## 2015-02-03 ENCOUNTER — Encounter: Payer: PRIVATE HEALTH INSURANCE | Admitting: Neurology

## 2015-02-04 ENCOUNTER — Encounter (HOSPITAL_COMMUNITY): Payer: Self-pay | Admitting: *Deleted

## 2015-02-04 ENCOUNTER — Encounter (HOSPITAL_COMMUNITY): Payer: Self-pay | Admitting: Cardiology

## 2015-02-04 ENCOUNTER — Ambulatory Visit (HOSPITAL_COMMUNITY)
Admission: RE | Admit: 2015-02-04 | Discharge: 2015-02-04 | Disposition: A | Discharge status: Home / Self Care | Payer: PRIVATE HEALTH INSURANCE | Source: Ambulatory Visit | Attending: Internal Medicine | Admitting: Internal Medicine

## 2015-02-04 VITALS — BP 138/72 | HR 97 | Ht 67.0 in | Wt 138.8 lb

## 2015-02-04 DIAGNOSIS — R339 Retention of urine, unspecified: Secondary | ICD-10-CM | POA: Diagnosis not present

## 2015-02-04 DIAGNOSIS — Z720 Tobacco use: Secondary | ICD-10-CM | POA: Diagnosis not present

## 2015-02-04 DIAGNOSIS — I110 Hypertensive heart disease with heart failure: Secondary | ICD-10-CM | POA: Insufficient documentation

## 2015-02-04 DIAGNOSIS — I5043 Acute on chronic combined systolic (congestive) and diastolic (congestive) heart failure: Secondary | ICD-10-CM | POA: Diagnosis not present

## 2015-02-04 DIAGNOSIS — Z7982 Long term (current) use of aspirin: Secondary | ICD-10-CM | POA: Diagnosis not present

## 2015-02-04 DIAGNOSIS — I5022 Chronic systolic (congestive) heart failure: Secondary | ICD-10-CM | POA: Insufficient documentation

## 2015-02-04 DIAGNOSIS — Z79899 Other long term (current) drug therapy: Secondary | ICD-10-CM | POA: Insufficient documentation

## 2015-02-04 DIAGNOSIS — Z951 Presence of aortocoronary bypass graft: Secondary | ICD-10-CM | POA: Diagnosis not present

## 2015-02-04 DIAGNOSIS — J449 Chronic obstructive pulmonary disease, unspecified: Secondary | ICD-10-CM | POA: Insufficient documentation

## 2015-02-04 DIAGNOSIS — Z8249 Family history of ischemic heart disease and other diseases of the circulatory system: Secondary | ICD-10-CM | POA: Diagnosis not present

## 2015-02-04 DIAGNOSIS — I255 Ischemic cardiomyopathy: Secondary | ICD-10-CM | POA: Insufficient documentation

## 2015-02-04 DIAGNOSIS — Z87891 Personal history of nicotine dependence: Secondary | ICD-10-CM | POA: Diagnosis not present

## 2015-02-04 DIAGNOSIS — F17200 Nicotine dependence, unspecified, uncomplicated: Secondary | ICD-10-CM

## 2015-02-04 DIAGNOSIS — I10 Essential (primary) hypertension: Secondary | ICD-10-CM | POA: Insufficient documentation

## 2015-02-04 DIAGNOSIS — I43 Cardiomyopathy in diseases classified elsewhere: Secondary | ICD-10-CM

## 2015-02-04 MED ORDER — CARVEDILOL 25 MG PO TABS: 25.0000 mg | ORAL_TABLET | Freq: Two times a day (BID) | ORAL | Status: DC

## 2015-02-04 NOTE — Progress Notes (Signed)
Patient ID: Travis Romero, male   DOB: 1950/11/26, 64 y.o.   MRN: [id number]   ADVANCED HF CLINIC NOTE .   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 Had [place] that showed 3 vessel disease and EF 20-25% with subsequent CABG x3. Post operative course was complicated by HF, respiratory distress, pleural effusion, and deconditioning. Once stable he was admitted to inpatient rehab August 1st through August 23. Discharge weight was 138 pounds.   At last visit volume was up. Increased lasix 40 mg twice a day for 3 days then resumed lasix 40 mg daily. Since we have seen him he has been seen by Pulmonary, TCTS and Dr. Tessa Romero. Referred for neurocognitive testing.   He returns for HF follow up. Last visit carvedilol increased to 18.75 mg twice a day. Overall feeling ok. Watns to return to work. Denies SOB/PND/Orthopnea/CP. Taking all medications. Refuses cardiac rehab. Walking without difficulty.   Studies ECHO 12/2014 -EF 20-25%. Grade IDD  [facility] 10/17/2014   Prox RCA lesion, 100% stenosed.  Prox Cx lesion, 100% stenosed.  Dist LAD lesion, 100% stenosed.  2nd Diag lesion, 75% stenosed.  Prox LAD lesion, 60% stenosed.  There is severe left ventricular systolic dysfunction.  ROS: All systems negative except as listed in HPI, PMH and Problem List.  SH:  Social History   Social History  . Marital Status: Divorced    Spouse Name: n/a  . Number of Children: 0  . Years of Education: master's   Occupational History  . licensed clinical social worker     Warden/ranger   Social History Main Topics  . Smoking status: Former Smoker -- 1.50 packs/day for 20 years    Types: Cigarettes    Quit  date: 10/11/2014  . Smokeless tobacco: Never Used  . Alcohol Use: 1.2 - 1.8 oz/week    2-3 Standard drinks or equivalent per week     Comment: 10/2014 " I drink everyday after supper " 2 glasses of wine   . Drug Use: No  . Sexual Activity: Not on file   Other Topics Concern  . Not on file   Social History Narrative   Lives alone.   A cousin, [name], lives nearby.    FH:  Family History  Problem Relation Age of Onset  . Hypertension Mother   . Heart disease Father     Past Medical History  Diagnosis Date  . Hypertension   . COPD (chronic obstructive pulmonary disease) ([place])   . BPH (benign prostatic hyperplasia)   . Back pain 2007    after fall down steps    Current Outpatient Prescriptions  Medication Sig Dispense Refill  . ALPRAZolam (XANAX) 0.5 MG tablet Take 1 tablet (0.5 mg total) by mouth 4 (four) times daily as needed for anxiety. 120 tablet 0  . aspirin EC 325 MG EC tablet Take 1 tablet (325 mg total) by mouth daily. 30 tablet 0  . atorvastatin (LIPITOR) 80 MG tablet Take 1 tablet (80 mg total) by mouth daily at 6 PM. 30 tablet 1  . budesonide-formoterol (SYMBICORT) 80-4.5 MCG/ACT inhaler Inhale 2 puffs into the lungs 2 (two) times daily. 1 Inhaler 1  . caffeine 200 MG TABS tablet  Take 200 mg by mouth daily.    . carvedilol (COREG) 12.5 MG tablet Take 1.5 tablets (18.75 mg total) by mouth 2 (two) times daily with a meal. 90 tablet 1  . digoxin (LANOXIN) 0.125 MG tablet Take 1 tablet (0.125 mg total) by mouth daily. 30 tablet 11  . dutasteride (AVODART) 0.5 MG capsule Take 1 capsule (0.5 mg total) by mouth daily. 30 capsule 1  . FIBER, CORN DEXTRIN, PO Take 1 tablet by mouth daily.    . furosemide (LASIX) 40 MG tablet Take 1 tablet (40 mg total) by mouth daily. 60 tablet 1  . HYDROcodone-acetaminophen (NORCO) 5-325 MG per tablet Take 1 tablet by mouth every 6 (six) hours as needed for moderate pain. 40 tablet 0  . levalbuterol (XOPENEX HFA) 45 MCG/ACT inhaler Inhale  1-2 puffs into the lungs every 6 (six) hours as needed for wheezing or shortness of breath. 1 Inhaler 12  . methocarbamol (ROBAXIN) 500 MG tablet Take 1 tablet (500 mg total) by mouth every 6 (six) hours as needed for muscle spasms. 90 tablet 1  . mirtazapine (REMERON) 30 MG tablet Take 30 mg by mouth at bedtime.    . pantoprazole (PROTONIX) 40 MG tablet Take 1 tablet (40 mg total) by mouth daily. 30 tablet 1  . sacubitril-valsartan (ENTRESTO) 97-103 MG Take 1 tablet by mouth 2 (two) times daily. 60 tablet 6  . senna-docusate (SENOKOT-S) 8.6-50 MG per tablet Take 2 tablets by mouth at bedtime. 60 tablet 1  . spironolactone (ALDACTONE) 25 MG tablet Take 0.5 tablets (12.5 mg total) by mouth daily. 30 tablet 1  . tamsulosin (FLOMAX) 0.4 MG CAPS capsule Take 2 capsules (0.8 mg total) by mouth daily after supper. 60 capsule 1  . traMADol (ULTRAM) 50 MG tablet Take 50 mg by mouth every 6 (six) hours as needed for moderate pain.    . traZODone (DESYREL) 50 MG tablet Take 50 mg by mouth at bedtime.     No current facility-administered medications for this encounter.    Filed Vitals:   02/04/15 1407  BP: 138/72  Pulse: 97  Height: 5\' 7"  (1.702 m)  Weight: 138 lb 12.8 oz (62.959 kg)  SpO2: 93%    PHYSICAL EXAM:  General:  Well appearing. No resp difficulty HEENT: normal Neck: supple. JVP 6-7. Carotids 2+ bilaterally; no bruits. No lymphadenopathy or thryomegaly appreciated. Cor: PMI normal. Regular rate & rhythm. No rubs, gallops or murmurs. Lungs:Decreased throughout. No wheezing Abdomen: soft, nontender, nondistended. No hepatosplenomegaly. No bruits or masses. Good bowel sounds. Extremities: no cyanosis, clubbing, rash, R and LLE no edema.  Neuro: alert & orientedx3, cranial nerves grossly intact. Moves all 4 extremities w/o difficulty. Affect pleasant.   ASSESSMENT & PLAN:  1. Chronic Systolic HF- ICM per [facility] ECHO 10/12/14 EF 20%  - NYHA II-III. Volume status looks good - Continue  12.5 mg spiro daily  - Increase carvedilol to 25  mg twice a day. Continue digoxin 0.12 mg daily  - Continue Entresto to 97-103 twice a day .  -Plan to repeat ECHO next visit.  If EF < 35% will need to consider ICD.  2. CABG x3 10/2014-  IMA-LAD;SVG to OM,SVG to Distal RCA (N/A). On statin, bb, aspirin  3. HTN- Better controlled. Increasing carvedilol as above.  4.Urinary Retention- Followed by Urology. On flomax.  5. COPD GOLD 3,   --PFT's 12/12/2014 FEV1 1.36 (46% ) ratio 48 no p no % improvement from saba with DLCO 26 % corrects to  39 % for alv volume s symbicort in am 6. Former smoker.- has not smoked. Congratulated.   He is able to return to work.  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   Follow up in 4 weeks with an ECHO and Dr Travis Romero .  Ellina Sivertsen NP-C  2:17 PM

## 2015-02-04 NOTE — Progress Notes (Signed)
Letter faxed to [employer] [phone]

## 2015-02-04 NOTE — Patient Instructions (Signed)
Increase Carvedilol 25 mg twice a day  Follow up in 4 weeks with echocardiogram  Echocardiogram An echocardiogram, or echocardiography, uses sound waves (ultrasound) to produce an image of your heart. The echocardiogram is simple, painless, obtained within a short period of time, and offers valuable information to your health care provider. The images from an echocardiogram can provide information such as:  Evidence of coronary artery disease (CAD).  Heart size.  Heart muscle function.  Heart valve function.  Aneurysm detection.  Evidence of a past heart attack.  Fluid buildup around the heart.  Heart muscle thickening.  Assess heart valve function. LET [facility] CARE PROVIDER KNOW ABOUT:  Any allergies you have.  All medicines you are taking, including vitamins, herbs, eye drops, creams, and over-the-counter medicines.  Previous problems you or members of your family have had with the use of anesthetics.  Any blood disorders you have.  Previous surgeries you have had.  Medical conditions you have.  Possibility of pregnancy, if this applies. BEFORE THE PROCEDURE  No special preparation is needed. Eat and drink normally.  PROCEDURE   In order to produce an image of your heart, gel will be applied to your chest and a wand-like tool (transducer) will be moved over your chest. The gel will help transmit the sound waves from the transducer. The sound waves will harmlessly bounce off your heart to allow the heart images to be captured in real-time motion. These images will then be recorded.  You may need an IV to receive a medicine that improves the quality of the pictures. AFTER THE PROCEDURE You may return to your normal schedule including diet, activities, and medicines, unless your health care provider tells you otherwise.   This information is not intended to replace advice given to you by your health care provider. Make sure you discuss any questions you have with  your health care provider.   Document Released: 03/19/2000 Document Revised: 04/12/2014 Document Reviewed: 11/27/2012 Elsevier Interactive Patient Education [employer].

## 2015-02-05 ENCOUNTER — Ambulatory Visit: Payer: PRIVATE HEALTH INSURANCE | Admitting: Physical Medicine & Rehabilitation

## 2015-02-06 ENCOUNTER — Telehealth (HOSPITAL_COMMUNITY): Payer: Self-pay | Admitting: *Deleted

## 2015-02-06 MED ORDER — CARVEDILOL 12.5 MG PO TABS: 18.7500 mg | ORAL_TABLET | Freq: Two times a day (BID) | ORAL | Status: AC

## 2015-02-06 NOTE — Telephone Encounter (Signed)
Pt called to let us know that he was very weak and not feeling well.  We increased his carvedilol to 25 mg bid instead of 18.75 mg bid on Tuesday.  Pt saw his pcp and his pressure was 84/50.   Spoke with Amy NP about medications changes and she said just have him go back on 18.75 mg of carvedilol.  Will change in [facility]

## 2015-02-11 ENCOUNTER — Encounter: Payer: PRIVATE HEALTH INSURANCE | Admitting: Physical Medicine & Rehabilitation

## 2015-02-12 ENCOUNTER — Other Ambulatory Visit: Payer: Self-pay | Admitting: Physical Medicine and Rehabilitation

## 2015-02-13 ENCOUNTER — Other Ambulatory Visit (HOSPITAL_COMMUNITY): Payer: Self-pay | Admitting: *Deleted

## 2015-02-13 MED ORDER — SACUBITRIL-VALSARTAN 97-103 MG PO TABS: 1.0000 | ORAL_TABLET | Freq: Two times a day (BID) | ORAL | Status: AC

## 2015-02-22 ENCOUNTER — Other Ambulatory Visit: Payer: Self-pay | Admitting: Physical Medicine and Rehabilitation

## 2015-03-12 ENCOUNTER — Ambulatory Visit (HOSPITAL_BASED_OUTPATIENT_CLINIC_OR_DEPARTMENT_OTHER)
Admission: RE | Admit: 2015-03-12 | Discharge: 2015-03-12 | Disposition: A | Discharge status: Home / Self Care | Payer: PRIVATE HEALTH INSURANCE | Source: Ambulatory Visit | Attending: Internal Medicine | Admitting: Internal Medicine

## 2015-03-12 ENCOUNTER — Ambulatory Visit (HOSPITAL_COMMUNITY)
Admission: RE | Admit: 2015-03-12 | Discharge: 2015-03-12 | Disposition: A | Discharge status: Home / Self Care | Payer: PRIVATE HEALTH INSURANCE | Source: Ambulatory Visit | Attending: Family Medicine | Admitting: Family Medicine

## 2015-03-12 ENCOUNTER — Encounter (HOSPITAL_COMMUNITY): Payer: Self-pay | Admitting: Internal Medicine

## 2015-03-12 VITALS — BP 122/66 | HR 92 | Wt 143.5 lb

## 2015-03-12 DIAGNOSIS — I517 Cardiomegaly: Secondary | ICD-10-CM | POA: Diagnosis not present

## 2015-03-12 DIAGNOSIS — E785 Hyperlipidemia, unspecified: Secondary | ICD-10-CM | POA: Insufficient documentation

## 2015-03-12 DIAGNOSIS — I5022 Chronic systolic (congestive) heart failure: Secondary | ICD-10-CM

## 2015-03-12 DIAGNOSIS — I5041 Acute combined systolic (congestive) and diastolic (congestive) heart failure: Secondary | ICD-10-CM | POA: Diagnosis not present

## 2015-03-12 DIAGNOSIS — I10 Essential (primary) hypertension: Secondary | ICD-10-CM | POA: Diagnosis not present

## 2015-03-12 DIAGNOSIS — F17200 Nicotine dependence, unspecified, uncomplicated: Secondary | ICD-10-CM | POA: Diagnosis not present

## 2015-03-12 DIAGNOSIS — I2510 Atherosclerotic heart disease of native coronary artery without angina pectoris: Secondary | ICD-10-CM | POA: Diagnosis not present

## 2015-03-12 DIAGNOSIS — Z720 Tobacco use: Secondary | ICD-10-CM | POA: Diagnosis not present

## 2015-03-12 DIAGNOSIS — I358 Other nonrheumatic aortic valve disorders: Secondary | ICD-10-CM | POA: Diagnosis not present

## 2015-03-12 NOTE — Progress Notes (Signed)
Patient ID: Travis Romero, male   DOB: 01-27-1951, 64 y.o.   MRN: [id number]   Advanced Heart Failure Clinic Note .   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 Had [place] that showed 3 vessel disease and EF 20-25% with subsequent CABG x3. Post operative course was complicated by HF, respiratory distress, pleural effusion, and deconditioning. Once stable he was admitted to inpatient rehab August 1st through August 23. Discharge weight was 138 pounds.   He returns for HF follow up. Last visit carvedilol increased to 25 mg twice a day. Weight is up 5 lbs since last visit, but wearing winter clothes and states weight has been stable at home. Still waiting to get back to work, has head 2nd interview. Says it is about a 3 month process. No SOB. Walks as far as he wants on flat ground. No SOB on stairs or inclines. No orthopnea, PND, or CP. No lightheadedness or dizziness. Taking all medications. Is drinking at least 4 12 oz bottles of water a day.  Studies ECHO 12/2014 -EF 20-25%. Grade IDD  [facility] 10/17/2014   Prox RCA lesion, 100% stenosed.  Prox Cx lesion, 100% stenosed.  Dist LAD lesion, 100% stenosed.  2nd Diag lesion, 75% stenosed.  Prox LAD lesion, 60% stenosed.  There is severe left ventricular systolic dysfunction.  ROS: All systems negative except as listed in HPI, PMH and Problem List.  SH:  Social History   Social History  . Marital Status: Divorced    Spouse Name: n/a  . Number of Children: 0  . Years of Education: master's   Occupational History  . licensed clinical social worker     Warden/ranger   Social History Main Topics  . Smoking status: Former Smoker -- 1.50  packs/day for 20 years    Types: Cigarettes    Quit date: 10/11/2014  . Smokeless tobacco: Never Used  . Alcohol Use: 1.2 - 1.8 oz/week    2-3 Standard drinks or equivalent per week     Comment: 10/2014 " I drink everyday after supper " 2 glasses of wine   . Drug Use: No  . Sexual Activity: Not on file   Other Topics Concern  . Not on file   Social History Narrative   Lives alone.   A cousin, [name], lives nearby.    FH:  Family History  Problem Relation Age of Onset  . Hypertension Mother   . Heart disease Father     Past Medical History  Diagnosis Date  . Hypertension   . COPD (chronic obstructive pulmonary disease) ([place])   . BPH (benign prostatic hyperplasia)   . Back pain 2007    after fall down steps    Current Outpatient Prescriptions  Medication Sig Dispense Refill  . ALPRAZolam (XANAX) 0.5 MG tablet Take 1 tablet (0.5 mg total) by mouth 4 (four) times daily as needed for anxiety. 120 tablet 0  . aspirin EC 325 MG EC tablet Take 1 tablet (325 mg total) by mouth daily. 30 tablet 0  . atorvastatin (LIPITOR) 80 MG tablet Take 1 tablet (80 mg total) by mouth daily at 6 PM. 30 tablet 1  . budesonide-formoterol (SYMBICORT) 80-4.5 MCG/ACT inhaler Inhale 2  puffs into the lungs 2 (two) times daily. 1 Inhaler 1  . caffeine 200 MG TABS tablet Take 200 mg by mouth daily.    . carisoprodol (SOMA) 250 MG tablet TAKE 1 TABLET BY MOUTH 4 TIMES A DAY AS NEEDED  0  . carvedilol (COREG) 12.5 MG tablet Take 1.5 tablets (18.75 mg total) by mouth 2 (two) times daily. 180 tablet 3  . digoxin (LANOXIN) 0.125 MG tablet Take 1 tablet (0.125 mg total) by mouth daily. 30 tablet 11  . dutasteride (AVODART) 0.5 MG capsule TAKE 1 CAPSULE (0.5 MG TOTAL) BY MOUTH DAILY. 30 capsule 1  . FIBER, CORN DEXTRIN, PO Take 1 tablet by mouth daily.    . furosemide (LASIX) 40 MG tablet Take 1 tablet (40 mg total) by mouth daily. 60 tablet 1  . levalbuterol (XOPENEX HFA) 45 MCG/ACT inhaler Inhale 1-2 puffs  into the lungs every 6 (six) hours as needed for wheezing or shortness of breath. 1 Inhaler 12  . methocarbamol (ROBAXIN) 500 MG tablet Take 1 tablet (500 mg total) by mouth every 6 (six) hours as needed for muscle spasms. 90 tablet 1  . mirtazapine (REMERON) 30 MG tablet Take 30 mg by mouth at bedtime.    Marland Kitchen oxyCODONE-acetaminophen (PERCOCET/ROXICET) 5-325 MG tablet Take 1 tablet by mouth every 6 (six) hours as needed.  0  . pantoprazole (PROTONIX) 40 MG tablet Take 1 tablet (40 mg total) by mouth daily. 30 tablet 1  . sacubitril-valsartan (ENTRESTO) 97-103 MG Take 1 tablet by mouth 2 (two) times daily. 60 tablet 6  . senna-docusate (SENOKOT-S) 8.6-50 MG per tablet Take 2 tablets by mouth at bedtime. 60 tablet 1  . spironolactone (ALDACTONE) 25 MG tablet Take 0.5 tablets (12.5 mg total) by mouth daily. 30 tablet 1  . tamsulosin (FLOMAX) 0.4 MG CAPS capsule Take 2 capsules (0.8 mg total) by mouth daily after supper. 60 capsule 1  . traMADol (ULTRAM) 50 MG tablet Take 50 mg by mouth every 6 (six) hours as needed for moderate pain.    . traZODone (DESYREL) 50 MG tablet Take 50 mg by mouth at bedtime.    Marland Kitchen HYDROcodone-acetaminophen (NORCO) 5-325 MG per tablet Take 1 tablet by mouth every 6 (six) hours as needed for moderate pain. (Patient not taking: Reported on 03/12/2015) 40 tablet 0   No current facility-administered medications for this encounter.    Filed Vitals:   03/12/15 1423  BP: 122/66  Pulse: 92  Weight: 143 lb 8 oz (65.091 kg)  SpO2: 96%   Wt Readings from Last 3 Encounters:  03/12/15 143 lb 8 oz (65.091 kg)  02/04/15 138 lb 12.8 oz (62.959 kg)  01/10/15 133 lb 12.8 oz (60.691 kg)    PHYSICAL EXAM:  General:  Well appearing. NAD HEENT: normal Neck: supple. JVP 6-7. Carotids 2+ bilaterally; no bruits. No thyromegaly or nodule noted. Cor: PMI normal. RRR, no M/G/R Lungs:Decreased breath sounds. Normal resp effort.  Abdomen: soft, NT, ND, no HSM. No bruits or masses. +BS    Extremities: no cyanosis, clubbing, rash, R and LLE. Trace pre tibial edema. Neuro: alert & orientedx3, cranial nerves grossly intact. Moves all 4 extremities w/o difficulty. Affect pleasant.   ASSESSMENT & PLAN:  1. Chronic Systolic HF- ICM per [facility] ECHO 10/12/14 EF 20%  - NYHA II. Volume status stable - Echo today shows return to normal EF 55-60% - Continue 12.5 mg spiro daily  - Continue carvedilol 25 mg BID. Continue digoxin 0.12 mg daily  -  Continue Entresto to 97-103 BID  - Continue lasix 40 mg daily.  - Encouraged to weight daily and to take extra lasix for weight gains of 3 lbs over night or 5 lbs in one week.  2. CABG x3 10/2014-  IMA-LAD;SVG to OM,SVG to Distal RCA (N/A). On statin, bb, aspirin  3. HTN - much improved control  4. COPD GOLD 3 - no longer smoking cigarettes. Now just e-cigs 6. Former smoker. - Now using E-cig exclusively.  He is stable on current meds and EF improved.  Follow up in 4 months  Satira Mccallum Tillery PA-C  2:50 PM   Patient seen and examined with Oda Kilts, PA-C. We discussed all aspects of the encounter. I agree with the assessment and plan as stated above.   Echo reviewed personally. EF now normalized after revascularization. Looks much better. Volume status stable. HTN well controlled. We discussed his smoking situation and now only using e-cigs. Will continue current meds. Stressed need for routine exercise. He refuses cardiac rehab.   Yazid Pop,MD 12:43 PM

## 2015-03-12 NOTE — Patient Instructions (Signed)
FOLLOW UP : 4 months with Dr.Bensimhon  Do the following things EVERYDAY: 1) Weigh yourself in the morning before breakfast. Write it down and keep it in a log. 2) Take your medicines as prescribed 3) Eat low salt foods-Limit salt (sodium) to 2000 mg per day.  4) Stay as active as you can everyday 5) Limit all fluids for the day to less than 2 liters

## 2015-03-12 NOTE — Progress Notes (Signed)
  Echocardiogram 2D Echocardiogram has been performed.  Donata Clay 03/12/2015, 2:00 PM

## 2015-03-27 ENCOUNTER — Encounter: Payer: PRIVATE HEALTH INSURANCE | Admitting: Neurology

## 2015-06-04 DEATH — deceased

## 2015-12-09 IMAGING — CR DG CHEST 1V PORT
1 series · 1 of 1 positions shown · non-contrast
Comparison: 10/27/2014

CLINICAL DATA: CABG

EXAM:
PORTABLE CHEST - 1 VIEW

[AP]
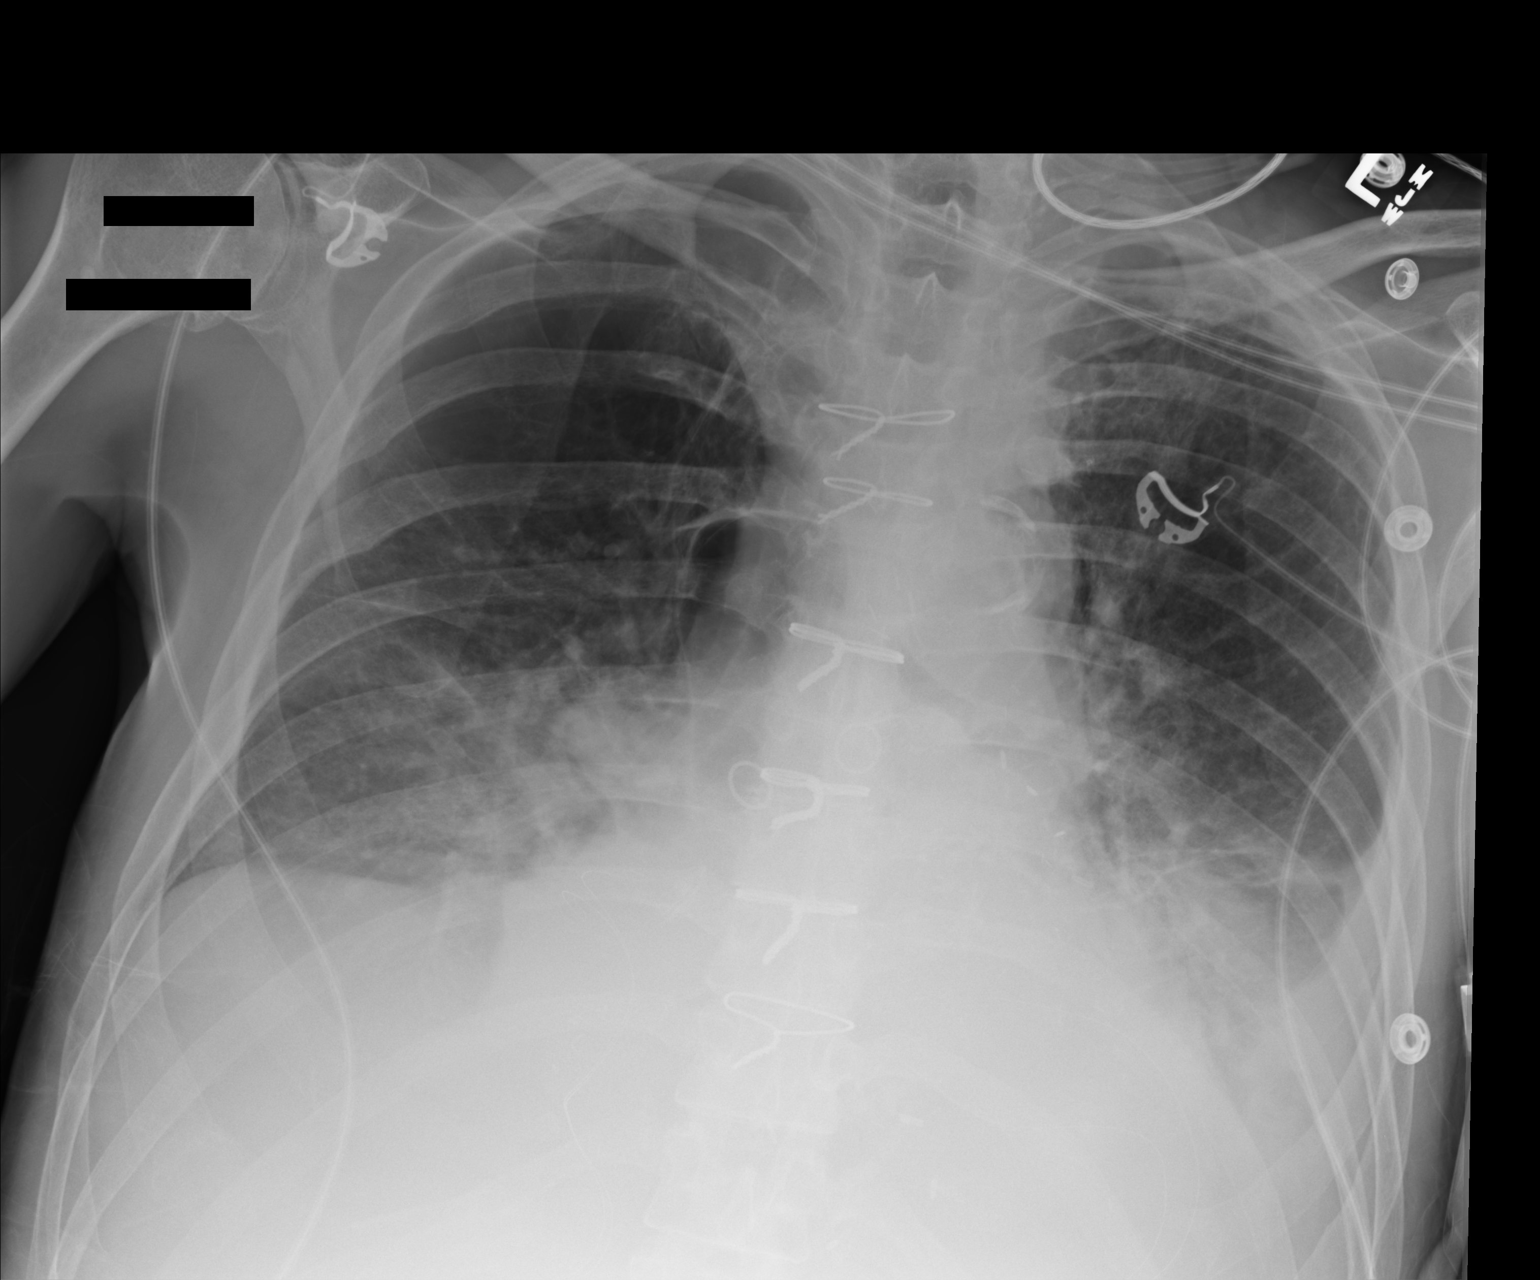

[1 of 1 positions shown; findings below may reference images not displayed]

FINDINGS: Mild improvement in bibasilar atelectasis/ infiltrate. Improvement
in small pleural effusions bilaterally. Bleb overlying the right
medial lung unchanged from prior studies. No pneumothorax.

Left subclavian central venous catheter has been removed.
IMPRESSION: Mild improvement in bibasilar atelectasis/infiltrate and small
pleural effusions. Improvement in vascular congestion.

## 2015-12-11 IMAGING — DX DG CHEST 1V PORT
1 series · 1 of 1 positions shown · non-contrast
Comparison: Portable chest x-ray [DATE]

CLINICAL DATA: Status post CABG on [REDACTED]

EXAM:
PORTABLE CHEST - 1 VIEW

[chest ap]
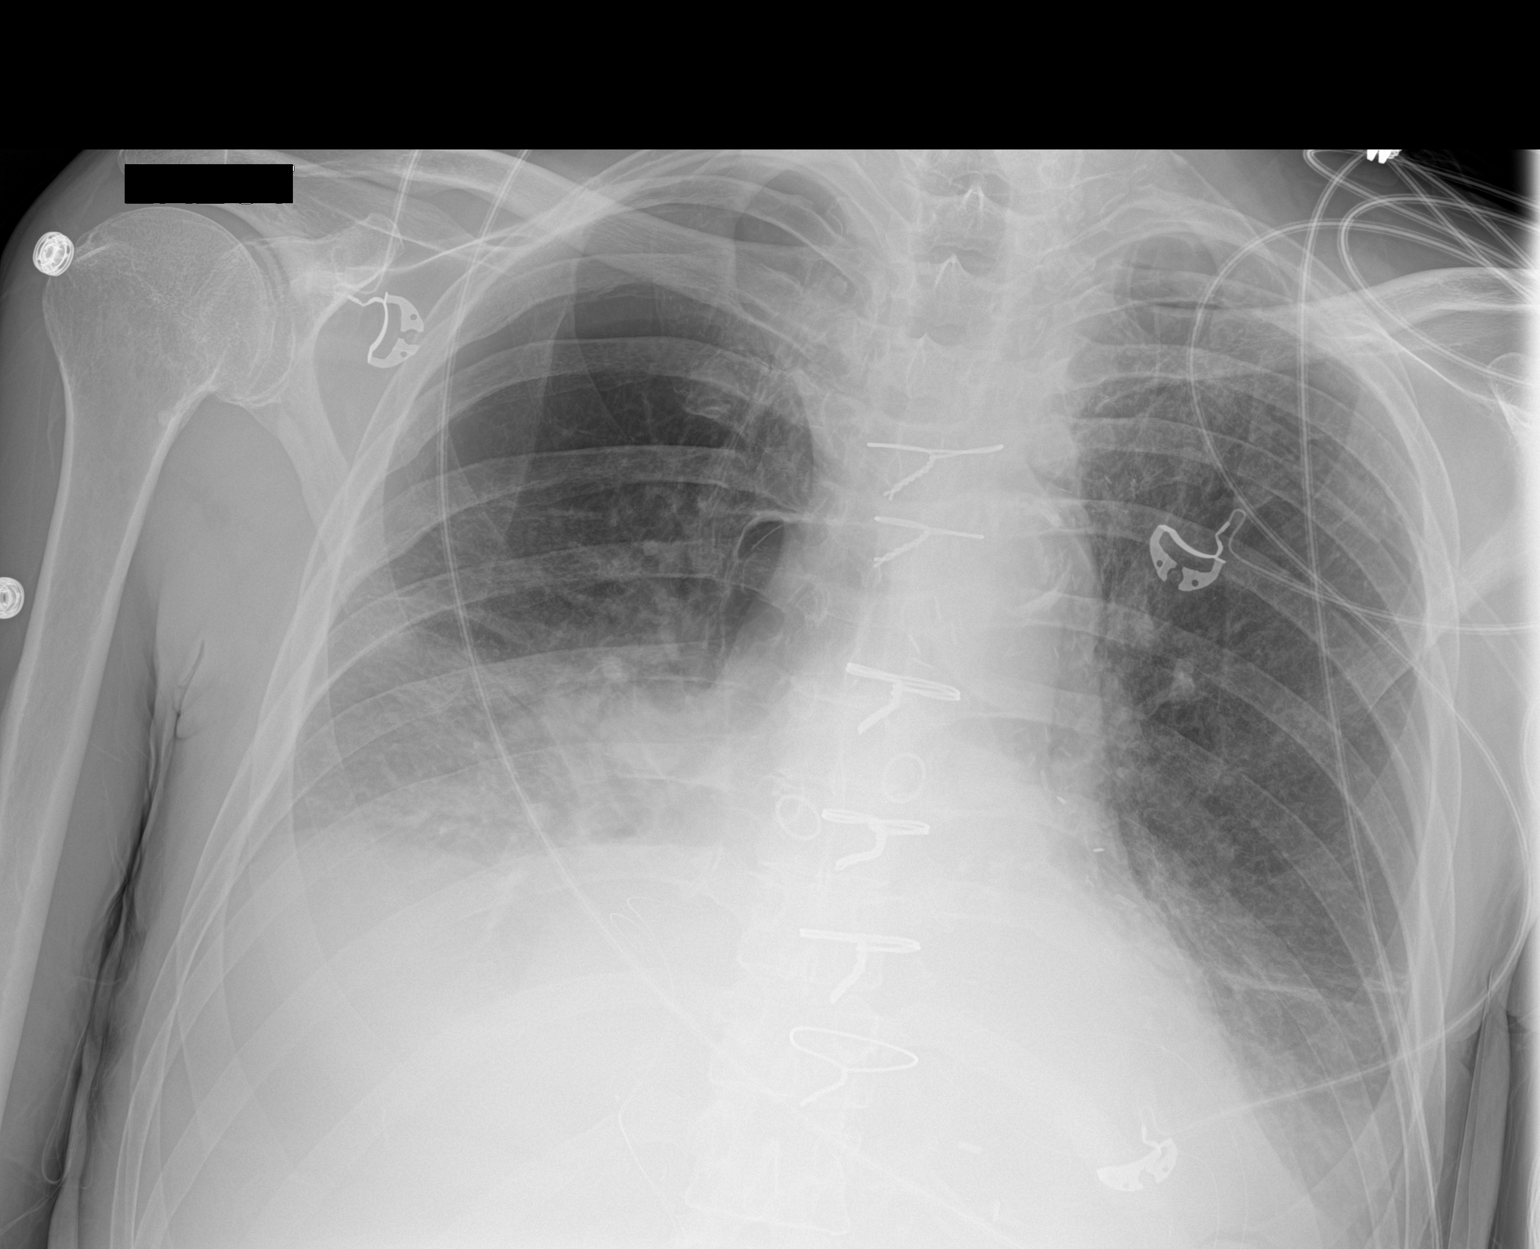

[1 of 1 positions shown; findings below may reference images not displayed]

FINDINGS: The lungs are slightly less well inflated today. There is persistent
bibasilar atelectasis. The left costophrenic angle is excluded from
the study. There is no pneumothorax. The cardiac silhouette is
mildly enlarged but stable. The pulmonary vascularity is not clearly
engorged. The sternal wires are intact. The bony thorax exhibits no
acute abnormality.
IMPRESSION: Inflation is decreased today. There is persistent bibasilar
atelectasis. No significant re-accumulation of the left pleural
effusion is observed today.
